# Patient Record
Sex: Female | Born: 1960 | Race: Black or African American | Hispanic: No | Smoking: Former smoker
Health system: Southern US, Community
[De-identification: ages and names within clinical notes are randomized; demographics above are authoritative.]

## PROBLEM LIST (undated history)

## (undated) DIAGNOSIS — N281 Cyst of kidney, acquired: Secondary | ICD-10-CM

## (undated) DIAGNOSIS — I10 Essential (primary) hypertension: Secondary | ICD-10-CM

## (undated) DIAGNOSIS — K5900 Constipation, unspecified: Secondary | ICD-10-CM

## (undated) DIAGNOSIS — F1910 Other psychoactive substance abuse, uncomplicated: Secondary | ICD-10-CM

## (undated) DIAGNOSIS — J45909 Unspecified asthma, uncomplicated: Secondary | ICD-10-CM

## (undated) DIAGNOSIS — I219 Acute myocardial infarction, unspecified: Secondary | ICD-10-CM

## (undated) DIAGNOSIS — M545 Low back pain, unspecified: Secondary | ICD-10-CM

## (undated) DIAGNOSIS — R0600 Dyspnea, unspecified: Secondary | ICD-10-CM

## (undated) DIAGNOSIS — E785 Hyperlipidemia, unspecified: Secondary | ICD-10-CM

## (undated) DIAGNOSIS — E119 Type 2 diabetes mellitus without complications: Secondary | ICD-10-CM

## (undated) DIAGNOSIS — G8929 Other chronic pain: Secondary | ICD-10-CM

## (undated) DIAGNOSIS — F419 Anxiety disorder, unspecified: Secondary | ICD-10-CM

## (undated) DIAGNOSIS — F32A Depression, unspecified: Secondary | ICD-10-CM

## (undated) DIAGNOSIS — F209 Schizophrenia, unspecified: Secondary | ICD-10-CM

## (undated) DIAGNOSIS — T7840XA Allergy, unspecified, initial encounter: Secondary | ICD-10-CM

## (undated) DIAGNOSIS — G4730 Sleep apnea, unspecified: Secondary | ICD-10-CM

## (undated) DIAGNOSIS — K449 Diaphragmatic hernia without obstruction or gangrene: Secondary | ICD-10-CM

## (undated) DIAGNOSIS — F319 Bipolar disorder, unspecified: Secondary | ICD-10-CM

## (undated) DIAGNOSIS — J189 Pneumonia, unspecified organism: Secondary | ICD-10-CM

## (undated) DIAGNOSIS — E039 Hypothyroidism, unspecified: Secondary | ICD-10-CM

## (undated) DIAGNOSIS — M1990 Unspecified osteoarthritis, unspecified site: Secondary | ICD-10-CM

## (undated) DIAGNOSIS — K219 Gastro-esophageal reflux disease without esophagitis: Secondary | ICD-10-CM

## (undated) DIAGNOSIS — J449 Chronic obstructive pulmonary disease, unspecified: Secondary | ICD-10-CM

## (undated) DIAGNOSIS — I509 Heart failure, unspecified: Secondary | ICD-10-CM

## (undated) DIAGNOSIS — G43909 Migraine, unspecified, not intractable, without status migrainosus: Secondary | ICD-10-CM

## (undated) DIAGNOSIS — I639 Cerebral infarction, unspecified: Secondary | ICD-10-CM

## (undated) DIAGNOSIS — D649 Anemia, unspecified: Secondary | ICD-10-CM

## (undated) DIAGNOSIS — I700 Atherosclerosis of aorta: Secondary | ICD-10-CM

## (undated) DIAGNOSIS — R200 Anesthesia of skin: Secondary | ICD-10-CM

## (undated) DIAGNOSIS — F329 Major depressive disorder, single episode, unspecified: Secondary | ICD-10-CM

## (undated) DIAGNOSIS — D126 Benign neoplasm of colon, unspecified: Secondary | ICD-10-CM

## (undated) HISTORY — DX: Sleep apnea, unspecified: G47.30

## (undated) HISTORY — DX: Heart failure, unspecified: I50.9

## (undated) HISTORY — DX: Hypothyroidism, unspecified: E03.9

## (undated) HISTORY — DX: Allergy, unspecified, initial encounter: T78.40XA

## (undated) HISTORY — DX: Gastro-esophageal reflux disease without esophagitis: K21.9

## (undated) HISTORY — DX: Diaphragmatic hernia without obstruction or gangrene: K44.9

## (undated) HISTORY — PX: BREAST BIOPSY: SHX20

## (undated) HISTORY — DX: Constipation, unspecified: K59.00

## (undated) HISTORY — DX: Bipolar disorder, unspecified: F31.9

## (undated) HISTORY — DX: Anemia, unspecified: D64.9

## (undated) HISTORY — DX: Atherosclerosis of aorta: I70.0

## (undated) HISTORY — DX: Benign neoplasm of colon, unspecified: D12.6

## (undated) HISTORY — DX: Unspecified osteoarthritis, unspecified site: M19.90

## (undated) HISTORY — DX: Cyst of kidney, acquired: N28.1

## (undated) HISTORY — DX: Anesthesia of skin: R20.0

## (undated) HISTORY — DX: Other psychoactive substance abuse, uncomplicated: F19.10

## (undated) HISTORY — DX: Depression, unspecified: F32.A

## (undated) HISTORY — DX: Hyperlipidemia, unspecified: E78.5

## (undated) HISTORY — DX: Anxiety disorder, unspecified: F41.9

## (undated) HISTORY — DX: Major depressive disorder, single episode, unspecified: F32.9

## (undated) HISTORY — DX: Chronic obstructive pulmonary disease, unspecified: J44.9

## (undated) HISTORY — PX: THYROID SURGERY: SHX805

## (undated) HISTORY — PX: TUBAL LIGATION: SHX77

---

## 1992-02-08 HISTORY — PX: PATELLA FRACTURE SURGERY: SHX735

## 1997-05-27 ENCOUNTER — Encounter: Admission: RE | Admit: 1997-05-27 | Discharge: 1997-05-27 | Payer: Self-pay | Admitting: Family Medicine

## 1997-05-28 ENCOUNTER — Encounter: Admission: RE | Admit: 1997-05-28 | Discharge: 1997-05-28 | Payer: Self-pay | Admitting: Family Medicine

## 1997-07-14 ENCOUNTER — Encounter: Admission: RE | Admit: 1997-07-14 | Discharge: 1997-07-14 | Payer: Self-pay | Admitting: Family Medicine

## 1997-07-23 ENCOUNTER — Emergency Department (HOSPITAL_COMMUNITY): Admission: EM | Admit: 1997-07-23 | Discharge: 1997-07-23 | Payer: Self-pay | Admitting: Emergency Medicine

## 2000-10-19 ENCOUNTER — Encounter: Payer: Self-pay | Admitting: *Deleted

## 2000-10-19 ENCOUNTER — Encounter: Admission: RE | Admit: 2000-10-19 | Discharge: 2000-10-19 | Payer: Self-pay | Admitting: *Deleted

## 2000-10-23 ENCOUNTER — Encounter: Payer: Self-pay | Admitting: *Deleted

## 2000-10-23 ENCOUNTER — Encounter: Admission: RE | Admit: 2000-10-23 | Discharge: 2000-10-23 | Payer: Self-pay | Admitting: *Deleted

## 2000-10-24 ENCOUNTER — Encounter: Payer: Self-pay | Admitting: *Deleted

## 2000-10-24 ENCOUNTER — Ambulatory Visit (HOSPITAL_COMMUNITY): Admission: RE | Admit: 2000-10-24 | Discharge: 2000-10-24 | Payer: Self-pay | Admitting: *Deleted

## 2000-11-16 ENCOUNTER — Encounter: Payer: Self-pay | Admitting: Surgery

## 2000-11-16 ENCOUNTER — Encounter: Admission: RE | Admit: 2000-11-16 | Discharge: 2000-11-16 | Payer: Self-pay | Admitting: Surgery

## 2000-11-16 ENCOUNTER — Ambulatory Visit (HOSPITAL_BASED_OUTPATIENT_CLINIC_OR_DEPARTMENT_OTHER): Admission: RE | Admit: 2000-11-16 | Discharge: 2000-11-16 | Payer: Self-pay | Admitting: Surgery

## 2000-11-16 ENCOUNTER — Encounter (INDEPENDENT_AMBULATORY_CARE_PROVIDER_SITE_OTHER): Payer: Self-pay | Admitting: Specialist

## 2000-12-09 ENCOUNTER — Emergency Department (HOSPITAL_COMMUNITY): Admission: EM | Admit: 2000-12-09 | Discharge: 2000-12-09 | Payer: Self-pay | Admitting: Emergency Medicine

## 2001-05-20 ENCOUNTER — Emergency Department (HOSPITAL_COMMUNITY): Admission: EM | Admit: 2001-05-20 | Discharge: 2001-05-20 | Payer: Self-pay | Admitting: Internal Medicine

## 2002-02-07 HISTORY — PX: ABDOMINAL HYSTERECTOMY: SHX81

## 2002-05-22 ENCOUNTER — Emergency Department (HOSPITAL_COMMUNITY): Admission: EM | Admit: 2002-05-22 | Discharge: 2002-05-22 | Payer: Self-pay

## 2002-05-23 ENCOUNTER — Encounter: Payer: Self-pay | Admitting: Emergency Medicine

## 2002-10-21 ENCOUNTER — Ambulatory Visit (HOSPITAL_COMMUNITY): Admission: RE | Admit: 2002-10-21 | Discharge: 2002-10-21 | Payer: Self-pay | Admitting: *Deleted

## 2002-10-21 ENCOUNTER — Encounter: Payer: Self-pay | Admitting: *Deleted

## 2002-11-11 ENCOUNTER — Encounter: Admission: RE | Admit: 2002-11-11 | Discharge: 2002-11-11 | Payer: Self-pay | Admitting: *Deleted

## 2002-11-11 ENCOUNTER — Encounter: Payer: Self-pay | Admitting: *Deleted

## 2003-02-16 ENCOUNTER — Emergency Department (HOSPITAL_COMMUNITY): Admission: EM | Admit: 2003-02-16 | Discharge: 2003-02-16 | Payer: Self-pay | Admitting: Emergency Medicine

## 2003-02-20 ENCOUNTER — Encounter (INDEPENDENT_AMBULATORY_CARE_PROVIDER_SITE_OTHER): Payer: Self-pay | Admitting: Specialist

## 2003-02-21 ENCOUNTER — Inpatient Hospital Stay (HOSPITAL_COMMUNITY): Admission: RE | Admit: 2003-02-21 | Discharge: 2003-02-22 | Payer: Self-pay | Admitting: *Deleted

## 2003-02-25 ENCOUNTER — Observation Stay (HOSPITAL_COMMUNITY): Admission: AD | Admit: 2003-02-25 | Discharge: 2003-02-26 | Payer: Self-pay | Admitting: Obstetrics and Gynecology

## 2003-06-11 ENCOUNTER — Inpatient Hospital Stay (HOSPITAL_COMMUNITY): Admission: EM | Admit: 2003-06-11 | Discharge: 2003-06-16 | Payer: Self-pay | Admitting: Emergency Medicine

## 2003-06-16 ENCOUNTER — Inpatient Hospital Stay (HOSPITAL_COMMUNITY): Admission: RE | Admit: 2003-06-16 | Discharge: 2003-06-18 | Payer: Self-pay | Admitting: Psychiatry

## 2003-07-11 ENCOUNTER — Emergency Department (HOSPITAL_COMMUNITY): Admission: EM | Admit: 2003-07-11 | Discharge: 2003-07-12 | Payer: Self-pay | Admitting: Emergency Medicine

## 2003-11-03 ENCOUNTER — Emergency Department (HOSPITAL_COMMUNITY): Admission: EM | Admit: 2003-11-03 | Discharge: 2003-11-03 | Payer: Self-pay | Admitting: Emergency Medicine

## 2004-01-27 ENCOUNTER — Emergency Department (HOSPITAL_COMMUNITY): Admission: EM | Admit: 2004-01-27 | Discharge: 2004-01-27 | Payer: Self-pay | Admitting: Emergency Medicine

## 2004-07-30 ENCOUNTER — Emergency Department (HOSPITAL_COMMUNITY): Admission: EM | Admit: 2004-07-30 | Discharge: 2004-07-31 | Payer: Self-pay | Admitting: Emergency Medicine

## 2004-07-31 ENCOUNTER — Inpatient Hospital Stay (HOSPITAL_COMMUNITY): Admission: EM | Admit: 2004-07-31 | Discharge: 2004-08-06 | Payer: Self-pay | Admitting: Psychiatry

## 2004-07-31 ENCOUNTER — Ambulatory Visit: Payer: Self-pay | Admitting: Psychiatry

## 2005-02-20 ENCOUNTER — Emergency Department (HOSPITAL_COMMUNITY): Admission: EM | Admit: 2005-02-20 | Discharge: 2005-02-20 | Payer: Self-pay | Admitting: Emergency Medicine

## 2006-02-07 HISTORY — PX: APPENDECTOMY: SHX54

## 2006-03-17 ENCOUNTER — Emergency Department (HOSPITAL_COMMUNITY): Admission: EM | Admit: 2006-03-17 | Discharge: 2006-03-17 | Payer: Self-pay | Admitting: Emergency Medicine

## 2007-10-19 ENCOUNTER — Emergency Department (HOSPITAL_COMMUNITY): Admission: EM | Admit: 2007-10-19 | Discharge: 2007-10-20 | Payer: Self-pay | Admitting: Emergency Medicine

## 2007-12-27 ENCOUNTER — Ambulatory Visit (HOSPITAL_COMMUNITY): Admission: RE | Admit: 2007-12-27 | Discharge: 2007-12-27 | Payer: Self-pay | Admitting: Otolaryngology

## 2007-12-27 ENCOUNTER — Encounter (INDEPENDENT_AMBULATORY_CARE_PROVIDER_SITE_OTHER): Payer: Self-pay | Admitting: Interventional Radiology

## 2008-02-08 DIAGNOSIS — I639 Cerebral infarction, unspecified: Secondary | ICD-10-CM

## 2008-02-08 HISTORY — DX: Cerebral infarction, unspecified: I63.9

## 2008-12-11 ENCOUNTER — Emergency Department (HOSPITAL_COMMUNITY): Admission: EM | Admit: 2008-12-11 | Discharge: 2008-12-11 | Payer: Self-pay | Admitting: Emergency Medicine

## 2009-01-13 ENCOUNTER — Other Ambulatory Visit: Payer: Self-pay | Admitting: Emergency Medicine

## 2009-01-13 ENCOUNTER — Ambulatory Visit: Payer: Self-pay | Admitting: Psychiatry

## 2009-01-13 ENCOUNTER — Inpatient Hospital Stay (HOSPITAL_COMMUNITY): Admission: AD | Admit: 2009-01-13 | Discharge: 2009-01-14 | Payer: Self-pay | Admitting: Psychiatry

## 2010-02-28 ENCOUNTER — Encounter: Payer: Self-pay | Admitting: Otolaryngology

## 2010-05-11 LAB — CBC
HCT: 42.9 % (ref 36.0–46.0)
Hemoglobin: 14.5 g/dL (ref 12.0–15.0)
MCV: 92.6 fL (ref 78.0–100.0)
RDW: 13.6 % (ref 11.5–15.5)

## 2010-05-11 LAB — URINALYSIS, ROUTINE W REFLEX MICROSCOPIC
Leukocytes, UA: NEGATIVE
Nitrite: NEGATIVE
Protein, ur: NEGATIVE mg/dL
Urobilinogen, UA: 1 mg/dL (ref 0.0–1.0)

## 2010-05-11 LAB — BASIC METABOLIC PANEL
Chloride: 105 mEq/L (ref 96–112)
GFR calc non Af Amer: >60 mL/min (ref >60)
Glucose, Bld: 97 mg/dL (ref 70–99)
Potassium: 3.8 mEq/L (ref 3.5–5.1)
Sodium: 138 mEq/L (ref 135–145)

## 2010-05-11 LAB — RAPID URINE DRUG SCREEN, HOSP PERFORMED
Amphetamines: NOT DETECTED
Benzodiazepines: NOT DETECTED
Tetrahydrocannabinol: POSITIVE — AB

## 2010-05-11 LAB — URINE MICROSCOPIC-ADD ON

## 2010-05-11 LAB — DIFFERENTIAL
Lymphocytes Relative: 25 % (ref 12–46)
Lymphs Abs: 2.1 10*3/uL (ref 0.7–4.0)
Neutrophils Relative %: 67 % (ref 43–77)

## 2010-05-11 LAB — TSH: TSH: 0.79 u[IU]/mL (ref 0.350–4.500)

## 2010-06-25 NOTE — Discharge Summary (Signed)
NAME:  Lindsay Rivera, Lindsay Rivera                         ACCOUNT NO.:  [id number]   MEDICAL RECORD NO.:  [id number]                   PATIENT TYPE:  IPS   LOCATION:  0304                                 FACILITY:  [facility]   PHYSICIAN:  Jeanice Lim, M.D.              DATE OF BIRTH:  [birth year]   DATE OF ADMISSION:  06/16/2003  DATE OF DISCHARGE:  06/18/2003                                 DISCHARGE SUMMARY   IDENTIFYING DATA:  This is a 50 year old African-American female, married,  involuntarily admitted, with a history of cocaine abuse, clean for 13 years.  The patient may not have realized cocaine was in her urine drug screen.  She  had been using cannabis increasingly for a month and that increased  depressive symptoms, restless legs and thoughts of cutting wrist for 4 days,  endorsing at least 2 weeks of depressed mood, irritability, with decreased  concentration, no clear history of mood swings.  This is the first  psychiatric admission.  The patient gives a history of possible mood swings  in the past, with cocaine use, mostly irritability and cocaine abuse in the  distant past, and more recent cannabis abuse.   ADMISSION MEDICATIONS:  None at home.  The patient had been started on  Lexapro 10 mg in May, something for sleep in the past, Ativan 0.5 b.i.d. and  oxycodone at home, Nu-Iron, and Naprosyn.   ALLERGIES:  ASPIRIN, PENICILLIN.   PHYSICAL EXAMINATION:  Within normal limits, neurologically nonfocal.   ROUTINE ADMISSION LABS:  Within normal limits.   MENTAL STATUS EXAM:  Fully alert, pleasant, cooperative, with bright affect,  labile at times.  Speech within normal limits with some pressure.  Mood  depressed, labile, irritable.  Thought processes positive suicidal ideation,  no psychotic symptoms, cognitively intact.  Judgment and insight poor.   ADMISSION DIAGNOSES:   AXIS I:  1. Bipolar 2 disorder.  2. 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  3. Cannabis abuse.   AXIS II:   Deferred.   AXIS III:  Status post appendectomy, iron deficiency anemia.   AXIS IV:  Moderate stressors, including marital conflict.   AXIS V:  35/60.   HOSPITAL COURSE:  The patient was admitted and ordered routine p.r.n.  medications, underwent further monitoring, and was encouraged to participate  in individual, group and milieu therapy.  The patient was assigned to dual  diagnosis program.  Family session with husband was requested, and Lexapro  was not restarted.  The patient requested outpatient counseling, was  agreeable to this.  The patient was started on  Zyprexa Zydis, Vistaril for  anxiety, and Ambien for sleep.  The patient showed no dangerous ideation,  improved judgment and insight, as well as a more stable mood.  She was  resumed on pain medicine, Vistaril p.r.n., given a nicotine patch, set up  for substance abuse outpatient treatment and psychiatric follow-up and  discharged after a  family meeting which was productive.  The patient was  discharged in improved condition, denied any dangerous ideation, being more  stable, again judgment and insight as well as coping skills improved.  The  patient reported motivation to be compliant with the aftercare plan, was  give medication education.   DISCHARGE MEDICATIONS:  1. Zyprexa Zydis 5 mg 1 b.i.d. p.r.n. and 10 mg q.9 p.m.  2. Ambien 10 mg q.h.s. p.r.n.  3. Vistaril 50 mg q.8 p.r.n. anxiety.   DISPOSITION:  The patient was to follow up with Dr. Malvin Johns Monday, May 15  at 2 p.m. in [facility] with Dr. Lolly Mustache June 2, and  Arley Phenix June 10 at 10 a.m.   DISCHARGE DIAGNOSES:   AXIS I:  1. Bipolar 2 disorder.  2. 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  3. Cannabis abuse.   AXIS II:  Deferred.   AXIS III:  Status post appendectomy, iron deficiency anemia.   AXIS IV:  Moderate stressors, including marital conflict.   AXIS V:  Global assessment of function on discharge was 55-60.                                                Jeanice Lim, M.D.    JEM/MEDQ  D:  07/20/2003  T:  07/20/2003  Job:  [id number]

## 2010-06-25 NOTE — Discharge Summary (Signed)
NAME:  Lindsay Rivera, Lindsay Rivera                         ACCOUNT NO.:  [id number]   MEDICAL RECORD NO.:  [id number]                   PATIENT TYPE:  INP   LOCATION:  0457                                 FACILITY:  [facility]   PHYSICIAN:  Gerri Spore B. Earlene Plater, M.D.               DATE OF BIRTH:  [birth year]   DATE OF ADMISSION:  02/20/2003  DATE OF DISCHARGE:  02/22/2003                                 DISCHARGE SUMMARY   ADMISSION DIAGNOSES:  1. Menorrhagia.  2. Stress urinary incontinence.   DISCHARGE DIAGNOSES:  1. Menorrhagia.  2. Stress urinary incontinence.   PROCEDURE:  1. Laparoscopic assisted vaginal hysterectomy by Dr. Earlene Plater.  2. Pubovaginal sling by Dr. Patsi Sears.   HISTORY OF PRESENT ILLNESS:  The patient was admitted for the above issues,  and presented for definitive surgical therapy.   HOSPITAL COURSE:  The patient underwent LAVH without difficulty, and  subsequent sling for stress urinary incontinence by Dr. Patsi Sears.  The  procedures went without complications.   Postoperatively, the patient had rapid ability to ambulate and tolerate a  regular diet.  However, she had voiding difficulty, which required overnight  admission.  On the first postoperative day __________ in and out  catheterization and medical management.  By the following morning, she was  voiding well, and was discharged home in satisfactory condition.   DISCHARGE MEDICATIONS:  1. Tylox 1-2 p.o. q.4-6h. for pain.  2. Urecholine.  3. Flomax.  4. Atarax per Dr. Patsi Sears.   DISPOSITION:  Discharged in satisfactory condition.   DISCHARGE INSTRUCTIONS:  No heavy lifting.  Nothing in the vagina for 6  weeks.  No driving for 2 weeks.  Call with fever, pain, nausea, vomiting,  voiding difficulties, or other concerns.   FOLLOW UP:  1. In 4 weeks with Dr. Earlene Plater.  2. Dr. Imelda Pillow office will call to arrange follow up.                                               Gerri Spore B. Earlene Plater, M.D.    WBD/MEDQ  D:   04/12/2003  T:  04/12/2003  Job:  [id number]

## 2010-06-25 NOTE — Op Note (Signed)
NAME:  Lindsay Rivera, Lindsay Rivera                         ACCOUNT NO.:  [id number]   MEDICAL RECORD NO.:  [id number]                   PATIENT TYPE:  AMB   LOCATION:  DAY                                  FACILITY:  [facility]   PHYSICIAN:  Sigmund I. Patsi Sears, M.D.         DATE OF BIRTH:  1960/07/29   DATE OF PROCEDURE:  02/20/2003  DATE OF DISCHARGE:                                 OPERATIVE REPORT   PREOPERATIVE DIAGNOSIS:  Stress urinary incontinence.   POSTOPERATIVE DIAGNOSIS:  Stress urinary incontinence.   OPERATION:  Pubovaginal sling.   SURGEON:  Sigmund I. Patsi Sears, M.D.   ASSISTANT:  Terri Piedra, N.P.   PROCEDURE:  Following laparoscopically-assisted vaginal hysterectomy, the  patient's vagina was inspected and a Foley catheter was inserted.  Marcaine  0.25% plain 10 mL was injected into the pubocervical fascial arch tissue  over the urethra.  A 2 cm urethral incision was made, subcutaneous tissue  dissected, and the retropubic space identified bilaterally.  Using the  transobturator passer, the pubovaginal sling was placed bilaterally.  Cystoscopy revealed no evidence of sling within the bladder.  Following this  the sling was cut subcutaneously with tensioning corrected and the wound  closed with two separate layers of 3-0 Vicryl suture.  The Foley catheter  was left in place and vaginal packing was placed with Estrace.  The patient  tolerated the procedure well, was awakened and taken to the recovery room in  good condition.  She is not given Toradol.                                               Sigmund I. Patsi Sears, M.D.    SIT/MEDQ  D:  02/20/2003  T:  02/20/2003  Job:  [id number]   cc:   Gerri Spore B. Earlene Plater, M.D.  [address]  [place]  [place] [address]  Fax: [phone]

## 2010-06-25 NOTE — Op Note (Signed)
NAME:  Lindsay Rivera, Lindsay Rivera                         ACCOUNT NO.:  [id number]   MEDICAL RECORD NO.:  [id number]                   PATIENT TYPE:  INP   LOCATION:  IC04                                 FACILITY:  [facility]   PHYSICIAN:  Barbaraann Barthel, M.D.              DATE OF BIRTH:  02/14/1960   DATE OF PROCEDURE:  06/11/2003  DATE OF DISCHARGE:                                 OPERATIVE REPORT   SURGEON:  Barbaraann Barthel, M.D.   PREOPERATIVE DIAGNOSIS:  Acute appendicitis.   POSTOPERATIVE DIAGNOSIS:  Acute appendicitis.   PROCEDURE:  Open appendectomy.   SPECIMENS:  Appendix.   INDICATIONS FOR PROCEDURE:  This is a 50 year old black female who came to  the emergency room with nausea, vomiting, elevated white count, right lower  quadrant pain, and a CT scan which showed acute appendicitis.  Surgery was  consulted and responded immediately.   We discussed surgery with the patient in detail, including complications not  limited to but including bleeding, infection, and appendiceal stump leak.  Informed consent was obtained.   Other issues included depression and anxiety and questionable suicidal  problems, and for that reason we have consulted the appropriate hospitalist  to follow her medically.  She will be observed in the intensive care unit.   GROSS OPERATIVE FINDINGS:  Those consistent with an acute suppurative,  nonperforated appendicitis.  The terminal ileum was normal.  No other  abnormalities were encountered in the right lower quadrant.   TECHNIQUE:  The patient was placed in the supine position.  After the  adequate administration of general anesthesia via endotracheal intubation, a  Foley catheter was aseptically inserted and the abdomen was prepped with  Betadine solution and draped in the usual manner.   A right lower quadrant incision was carried out through the skin and  subcuticular to the rectus muscle sheath.  The rectus muscle was then  retracted medially.  We  grasped the posterior sheath and the peritoneum and  entered the abdominal cavity.  The appendix was readily encountered.  The  mesoappendix was ligated with 2-0 silk, and the appendix was then amputated  with the TA30 stapling device.  We lightly cauterized this with the tip of  the cautery.  We then changed gloves, irrigated the right lower quadrant,  and closed the abdomen according to layers, closing the peritoneum with an 0  Polysorb and the fascia with interrupted figure-of-eight 0 Polysorb.  The  subcutaneous tissue  was irrigated, and the skin was approximated with a stapling device.  Prior  to closure, all sponge, needle, and instrument counts were found to be  correct.  Estimated blood loss was minimal.  The patient received 1 L of  crystalloid intraoperatively.  No drains were placed, and there were no  complications.      ___________________________________________  Barbaraann Barthel, M.D.   WB/MEDQ  D:  06/11/2003  T:  06/12/2003  Job:  [id number]   cc:   Hanley Hays. Dechurch, M.D.  [address]  [place]  [place] [address]  Fax: [phone]   Hilario Quarry, M.D.  [address]  [place]  [place] [address]  Fax: [phone]

## 2010-06-25 NOTE — H&P (Signed)
NAME:  Lindsay Rivera, Lindsay Rivera                         ACCOUNT NO.:  [id number]   MEDICAL RECORD NO.:  [id number]                   PATIENT TYPE:  INP   LOCATION:  9319                                 FACILITY:  [facility]   PHYSICIAN:  Gerri Spore B. Earlene Plater, M.D.               DATE OF BIRTH:  1960/09/28   DATE OF ADMISSION:  02/25/2003  DATE OF DISCHARGE:                                HISTORY & PHYSICAL   ADMISSION DIAGNOSES:  1. Status post laparoscopically-assisted vaginal hysterectomy and sling on     February 20, 2003.  2. Postoperative vaginal cuff cellulitis with lower abdominal pain.   HISTORY OF PRESENT ILLNESS:  A 50 year old African-American female status  post LAVH and sling at [facility] on February 20, 2003, in  conjunction with Dr. Patsi Sears.  The patient's hysterectomy was for  fibroids and menorrhagia and was without incident.  The patient subsequently  underwent the obturator tape sling for urinary incontinence with Dr.  Patsi Sears.  The patient's postoperative course was uncomplicated other than  some itching related apparently to pain medications.  She was discharged  home on the first postoperative day without incident.   Over the last two to three days the patient complains of intermittent fever  as high as 101 with associated nausea and vomiting since last night.  Did  have a bowel movement yesterday, which relieved some lower abdominal pain  she had been having.  At this point she states she has some mild dysuria,  slight difficulty voiding, and some low back pain over the weekend.  Now  complaining of lower abdominal sharp and shooting pain which is moderate in  intensity, receiving relief with Percocet.   PAST MEDICAL HISTORY:  Fibroids.   PAST SURGICAL HISTORY:  1. LAVH, sling as above.  2. Tubal ligation.  3. Left oophorectomy during pregnancy.  4. Benign breast biopsy.   MEDICATIONS:  Percocet.   ALLERGIES:  ASPIRIN and PENICILLIN (causes hives).   SOCIAL HISTORY:  The patient is a one pack per week smoker.  No alcohol or  other drugs.   FAMILY HISTORY:  Noncontributory.   REVIEW OF SYSTEMS:  Otherwise noncontributory.   PHYSICAL EXAMINATION:  VITAL SIGNS:  Blood pressure 118/78, temperature  97.7, weight 136.  GENERAL:  Alert and oriented, in no acute distress but obviously in moderate  discomfort.  ABDOMEN:  Soft with active bowel sounds.  Her incisions from the  laparoscopic portion are healing well.  The patient has mild bilateral lower  quadrant tenderness without acute abdominal signs.  PELVIC:  Normal external genitalia. Vaginal cuff and incision over the sling  are healing well.  The patient is tender at the cuff to palpation.  No mass  palpable.   Wet prep performed in the office showed too numerous to count white cells  and a few yeast.  In-and-out catheterization done in the office showed trace  leukocyte, otherwise negative.  Complete blood count done in the office  showed white count 10.6, hemoglobin 10.5, platelets 431, differential with  increased PMNs at 75%.   ASSESSMENT:  Probable cuff cellulitis after laparoscopically-assisted  vaginal hysterectomy.  Given that the patient is having problems with nausea  and vomiting, I did not think she would do well with oral medications for  antibiotics.  Therefore, she is admitted for IV hydration, antiemetics, and  IV antibiotics.  Will also check CT of the abdomen and pelvis to rule out  other acute process.  Also, a urine culture has been sent from the office.                                               Gerri Spore B. Earlene Plater, M.D.    WBD/MEDQ  D:  02/25/2003  T:  02/25/2003  Job:  [id number]

## 2010-06-25 NOTE — Discharge Summary (Signed)
Lindsay Rivera, PULSE               ACCOUNT NO.:  [id number]   MEDICAL RECORD NO.:  [id number]          PATIENT TYPE:  IPS   LOCATION:  0404                          FACILITY:  [facility]   PHYSICIAN:  Anselm Jungling, MD  DATE OF BIRTH:  Sep 17, 1960   DATE OF ADMISSION:  07/31/2004  DATE OF DISCHARGE:  08/06/2004                                 DISCHARGE SUMMARY   IDENTIFYING DATA AND REASON FOR ADMISSION:  [facility] Lindsay [facility], a 50 year old married female who was  admitted through the emergency department, requesting help for cessation of  drug abuse.  She described her drug abuse of consisting of cocaine and  marijuana.  She reported suicidal and homicidal ideation.   PAST HISTORY:  The patient had seen Dr. Lolly Mustache in the past in the [facility]  outpatient office.  She had had marital difficulties.  Her previous [facility]  admission had been in May of 2005.  She was previously diagnosed with  bipolar disorder type 2.  Past medications had included Lexapro, Zyprexa,  and Ativan.   INITIAL DIAGNOSES:  AXIS I:  Psychosis not otherwise specified, rule out  substance-induced psychosis, cocaine abuse, and depressive disorder not  otherwise specified.Marland Kitchen  AXIS II:  Deferred.  AXIS III:  Knee abrasion, iron deficiency anemia, pyuria, left thumb pain,  and allergies to ASPIRIN and PENICILLIN.  AXIS IV:  Stressors, severe.  AXIS V:  Global assessment of function on admission 20 to 30.   MEDICAL, LABORATORY AND PHYSICAL EXAMINATION:  The patient was medically  cleared at the emergency room prior to transfer to the inpatient psychiatric  service.  TSH was 0.836, within normal limits.  Valproic acid level just  prior to discharge was 75.4, within the therapeutic range.   HOSPITAL COURSE:  The patient presented initially quite angry, agitated, and  finding fault with the unit staff.  She was placed on q.15 minute checks for  safety.  At times she was verbally  threatening to staff and so was watched  more closely because of this.  She was ordered Zyprexa 10 mg h.s., also  p.r.n. Ativan was utilized.   Over the next 5 days, the patient gradually became less irritable, more  cooperative, and re directable.  There were no further incidents of her  threatening staff.  She was able to have insight into the fact that her  behaviors and level of irritability were far out of proportion to the  situation and quite inappropriate.  She was interested in utilizing  medication treatment to stabilize this aspect of her mood disorder.  She was  open to a trial of Depakote, which was begun the in extended release form at  250 mg q.a.m., and 750 mg q.h.s.  This was well tolerated.   The patient was treated with Septra DS for apparent urinary tract infection.   By the 7th hospital day, the patient was felt to be appropriate for  discharge to aftercare.   AFTERCARE PLANS:  The patient was to be followed up at the [facility] health  system  clinic in [place].  She was to be seen there on July 11 at 3 p.m.  by Florencia Reasons, and on July 14, 11:30 a.m., by psychiatrist Dr. Toni Arthurs.   DISCHARGE MEDICATIONS:  Zyprexa 10 mg p.o. q.h.s.  Depakote 250 mg q.a.m.  and 750 mg q.p.m., trazodone 50 mg q.h.s., Symmetrel 100 mg b.i.d.,  Valacyclovir 500 mg b.i.d. for genital lesions, and Septra DS 1 tablet  b.i.d. x3 days, and hydrocortisone cream 1% to lesions b.i.d.   DISCHARGE DIAGNOSES:  AXIS I:  Bipolar affective disorder, manic, without  psychotic features, resolving, polysubstance dependence.  AXIS II:  Deferred.  AXIS III:  Genital herpes.  AXIS IV:  Stressors, severe.  AXIS V:  Global assessment of function on discharge 55.     _______________    SPB/MEDQ  D:  08/25/2004  T:  08/25/2004  Job:  [id number]

## 2010-06-25 NOTE — Discharge Summary (Signed)
NAME:  Lindsay Rivera, Lindsay Rivera                         ACCOUNT NO.:  [id number]   MEDICAL RECORD NO.:  [id number]                   PATIENT TYPE:  INP   LOCATION:  9319                                 FACILITY:  [facility]   PHYSICIAN:  Gerri Spore B. Earlene Plater, M.D.               DATE OF BIRTH:  Nov 08, 1960   DATE OF ADMISSION:  02/25/2003  DATE OF DISCHARGE:  02/26/2003                                 DISCHARGE SUMMARY   ADMISSION DIAGNOSES:  1. Post hysterectomy vaginal cuff cellulitis.  2. Nausea and vomiting.   HISTORY OF PRESENT ILLNESS:  For details, please see the history and  physical on the chart. Briefly, the patient presented approximately 1 week  status post LAVH and sling at [facility] with myself and  in conjunction with Dr. Patsi Sears. The hysterectomy was for fibroids and  menorrhagia and the patient also had stress urinary incontinence. There were  no complications during surgery. The patient was seen in the office on the  day of admission with complaints of intermittent fever as high as 101 with  associated nausea and vomiting. Had been having bowel movements. Had had  some dysuria and low back pain. Urine culture was sent from the office and  is pending at this time. Was noted to have some vaginal cuff tenderness.  Given her nausea and vomiting, the patient felt that she would be unable to  take oral medication at home for outpatient treatment of her vaginal cuff  cellulitis and was therefore admitted for observation, hydration and  intravenous antiemetics and antibiotics.   HOSPITAL COURSE:  The patient was admitted and started on intravenous  Gentamicin and Clindamycin. She was given intravenous Phenergan for nausea  and Toradol for pain relief. She was hydrated intravenously and diet was  advanced as tolerated. The patient's pain improved during her stay and she  did not have any fever. Her white blood cell count remained stable  throughout her course at 10,000. CT  scan of the abdomen and pelvis showed no  evidence of any acute intra-abdominal or pelvic process. The right ovary  appeared somewhat large and the left ovary appeared normal. Subsequent  pelvic ultrasound showed an enlarged ovary at 5.7 cm with normal arterial  and venous flow and recommendation was for a repeat ultrasound in 6 to 10  weeks.   DISPOSITION:  Given that the patient's pain had improved and she did not  have fever during her stay, she was discharged to home to complete a course  of oral antibiotics, as she was able to tolerate a regular diet.   DISCHARGE MEDICATIONS:  1. Clindamycin 450 mg p.o. q. 6 hours x7 days.  2. Phenergan 25 mg p.o. q. 6 hours p.r.n. nausea.   DISCHARGE INSTRUCTIONS:  The patient to call if any increase or worsening of  symptoms or recurrence of fever, nausea, or vomiting.   FOLLOW UP:  [facility]  with Dr. Earlene Plater in 1 weeks.   CONDITION ON DISCHARGE:  Satisfactory.                                              Gerri Spore B. Earlene Plater, M.D.   WBD/MEDQ  D:  02/26/2003  T:  02/27/2003  Job:  [id number]

## 2010-06-25 NOTE — Op Note (Signed)
[facility]  Patient:    Lindsay Rivera, Lindsay Rivera Visit Number: [id number] MRN: [id number]          Service Type: DSU Location: [facility] Attending Physician:  Charlton Haws Dictated by:   Currie Paris, M.D. Proc. Date: 11/16/00 Admit Date:  11/16/2000   CC:         Dr. Darl Householder   Operative Report  CCS# [id number]  PREOPERATIVE DIAGNOSIS: 1. Right breast mass, upper outer quadrant. 2. Right breast mass, upper inner quadrant.  POSTOPERATIVE DIAGNOSIS: 1. Right breast mass, upper outer quadrant. 2. Right breast mass, upper inner quadrant.  OPERATION PERFORMED:  Needle guided excision of two breast masses.  SURGEON:  Currie Paris, M.D.  ANESTHESIA:  General.  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  Neither were palpable.  The patient strongly desired surgical excision rather than follow-up or core biopsies.  DESCRIPTION OF PROCEDURE:  The patient had guide wires placed in radiology at the breast center.  She was seen in the holding area and was prepared for surgery and had no further questions.  The patient was taken to the operating room and after satisfactory general anesthesia had been obtained, the breast was prepped and draped.  The upper inner quadrant lesion was approached first.  The guide wire entered slightly lateral and in inferior to the lesion and an X on the skin marked the spot of the lesion.  I made the incision directly over the X and traveled down through subcutaneous tissues.  I then was able to manipulate the wire into the field and trace it a little further and then by palpation could feel the mass.  I was able to grasp it with an Allis and excise it with the cautery.  Bleeders were then electrocoagulated and the incision closed with 3-0 Vicryl  followed by 4-0 Monocryl subcuticular.  I did infiltrate some Marcaine to help with postoperative analgesia.  Specimen radiograph confirmed the lesion but it was also visible.  Attention was then turned to the upper outer quadrant lesion and an incision again made approximately over the lesion and subcutaneous tissues divided. Again, the guide wire was manipulated into the wound and traced a little deeper into the breast and then when I could palpate the lesion, grasped it with an Allis and excised it.  The incision was closed again with 3-0 Vicryl and 4-0 Monocryl and used Marcaine.  Radiology again reported the lesion had been retrieved.  Steri-Strips applied.  The patient tolerated the procedure well.  There were no operative complications.  All counts were correct. Dictated by:   Currie Paris, M.D. Attending Physician:  Charlton Haws DD:  11/16/00 TD:  11/16/00 Job: [id number]
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
## 2010-06-25 NOTE — Op Note (Signed)
NAME:  Lindsay Rivera, Lindsay Rivera                         ACCOUNT NO.:  [id number]   MEDICAL RECORD NO.:  [id number]                   PATIENT TYPE:  OBV   LOCATION:  0457                                 FACILITY:  [facility]   PHYSICIAN:  Gerri Spore B. Earlene Plater, M.D.               DATE OF BIRTH:  [birth year]   DATE OF PROCEDURE:  02/20/2003  DATE OF DISCHARGE:                                 OPERATIVE REPORT   PREOPERATIVE DIAGNOSES:  1. Menorrhagia.  2. Stress urinary incontinence.   POSTOPERATIVE DIAGNOSES:  1. Menorrhagia.  2. Stress urinary incontinence.   OPERATION/PROCEDURE:  1. Laparoscopic-assisted vaginal hysterectomy.  2. Dr. Patsi Sears performed a sling.   SURGEON:  Chester Holstein. Earlene Plater, M.D.   FINDINGS:  1. Ten-week size uterus.  2. Absent left tube and ovary.  3. Normal-appearing right tube and ovary with filmy adhesions and posterior     cul-de-sac blood loss.   ESTIMATED BLOOD LOSS:  200 mL.   FLUIDS:  2300 mL.   SPECIMENS:  Uterus and cervix.   DRAINS:  Foley catheter.   INDICATIONS:  The patient with history of abnormal bleeding, desiring  surgical management and history of stress urinary incontinence, desiring  surgical repair.  The operative risks have been discussed including  infection, bleeding, damage to surround organs.   DESCRIPTION OF PROCEDURE:  The patient was taken to the operating room and  general anesthesia obtained.  She was placed in the [place] stirrups and  prepped and draped in the standard fashion.  Bladder was entered with a red  rubber catheter.  Examination under anesthesia showed a 10-week size  anteverted uterus.  No adnexal masses.   Gowns and gloves changed.  Attention turned to the abdomen.  A 10 mm  incision made vertically below the umbilicus, carried sharply to the fascia.  The fascia was divided sharply and elevated with Kocher clamps.  The  posterior sheath and peritoneum divided sharply.  Pursestring suture of 0  Vicryl placed around the  fascial defect.  Hasson cannula inserted and  secured.  Pneumoperitoneum obtained with CO2 gas.  The 5 mm ports were  placed in each lower abdominal quadrant under direct laparoscopic  visualization.  Trendelenburg position obtained.  Bowel mobilized  superiorly.  Pelvis was inspected with the above findings noted.  Filmy  adhesions from the right ovary to the posterior cul-de-sac were taken down  sharply.   The upper abdomen was inspected and was normal except for some omental  adhesions in the left upper quadrant just above the umbilicus.   The course of the left ureter identified.  The left round ligament  cauterized with bipolar and divided.  Cardinal ligament was cauterized with  bipolar and divided.  The course of the right ureter was identified.  The  right round ligament and utero-ovarian ligaments were cauterized and divided  with the tripolar.  Bladder flap created sharply.   CO2 gas  was released and the attention to the vagina.  Weighted speculum  inserted.  Posterior colpotomy incision made sharply.  Each uterosacral  ligament was clamped with a curved Heaney clamp, divided and suture ligated  with 0 Vicryl with hemostasis obtained.  The remainder of the vaginal mucosa  was circumscribed with the Bovie.  Anterior colpotomy made sharply.   Deaver retractor placed to the anterior cul-de-sac and the remainder of the  cardinal ligaments were clamped with the LigaSure, cauterized and divided.  Uterus was removed as an intact specimen.  A superficial abrasion of the  bladder muscularis was identified anteriorly.  It was reinforced with a  single  figure-of-eight suture of 0 Vicryl.  Dr. Patsi Sears was informed of  this when he arrived.   The vaginal cuff was closed with figure-of-eight sutures of 0 Vicryl.  McCall's culdoplasty suture was placed prior to closure of the cuff by  entering the posterior cul-de-sac, taking a bite of the left uterosacral  ligament, reforming the  posterior peritoneum; taking a bit of the right  uterosacral ligament and exiting posteriorly on the right.  After the  remainder of the cuff was closed, the culdoplasty suture was tied down which  provided good support to the cuff.   Attention was turned to the abdomen and the pneumoperitoneum reobtained.  Cuff was inspected and was hemostatic as was the right tube and ovary.  Therefore, procedure was terminated.  Scope was removed.  Gas was released  after the inferior ports were removed and those sites were found to be  hemostatic.   The Hasson cannula was removed and placed __________ in the fascial defect,  tying down the pursestring suture.  This is the right fascial defect and no  intra-abdominal contents were herniated through the closure.  Skin  __________ was closed with subcuticular 4-0 Vicryl and infiltrated with a  total of 15 mL of 0.5% Marcaine.  At this point my portion of the procedure  was complete and Dr. Patsi Sears was starting his sling.  Please see his note  for details.                                               Gerri Spore B. Earlene Plater, M.D.    WBD/MEDQ  D:  02/20/2003  T:  02/20/2003  Job:  [id number]

## 2010-06-25 NOTE — Consult Note (Signed)
NAME:  Lindsay Rivera, Lindsay Rivera                         ACCOUNT NO.:  [id number]   MEDICAL RECORD NO.:  [id number]                   PATIENT TYPE:  INP   LOCATION:  IC04                                 FACILITY:  [facility]   PHYSICIAN:  Hanley Hays. Dechurch, M.D.           DATE OF BIRTH:  11/05/1960   DATE OF CONSULTATION:  06/11/2003  DATE OF DISCHARGE:                                   CONSULTATION   REFERRING PHYSICIAN:  Barbaraann Barthel, M.D.   PRIMARY CAREGIVER:  [facility]   A 50 year old African-American female in good health who presented to the  emergency room complaining of abdominal pain, was found to have acute  appendicitis by CT.  During the course of her evaluation, she related the  fact that she felt desire to hurt herself and had some suicidal ideation.  I  was asked to consult for perioperative management.  The patient apparently  has been married 13 years.  She has had increasing arguments with her spouse  and multiple other social issues that have been compounding her depression.  She has never had a history of depression, has never been treated.  She does  note some down time where she feels blue.  Since her hysterectomy in  January, which was done for abnormal Pap smears and persistent bleeding, she  states she has just not felt as positive about things as she has in the  past.  She states she hears voices telling her that everyone would be better  without her, without Lindsay Rivera.  She tends to speak of herself in the third  person.  The patient has a remote history of crack abuse but claims to have  been clean for the last 16 years.  She has been married 13 years to her  current spouse and up until recently she states her relationship has been  stable.  The patient smokes marijuana maybe 1 to 2 times per week.  She has  an occasional beer she states.  She states she never partakes of cocaine  though her cocaine screen was positive today in the emergency room.  She  feels that she may have been given some cocaine-laced marijuana.  The  patient is not employed.  She is a Futures trader.  She has a 3-year-old son, a  108 and a 5 year old adult children who are all alive and well.  She has 4  sisters all alive and well.   FAMILY MEDICAL HISTORY:  Unremarkable for suicide or other factors.   SOCIAL HISTORY:  She is married.  This is a second marriage.  Three children  as noted above.  She smokes 1 to 2 packs per week.   PAST MEDICAL HISTORY:  1. Status post hysterectomy secondary to fibroids and abnormal Pap smear,     apparently had a vaginal hysterectomy, ovaries still intact.  2. Status post a bladder repair/pack.  3. History of a right breast mass  apparently benign.   REVIEW OF SYSTEMS:  As per HPI.   PHYSICAL EXAMINATION:  VITAL SIGNS:  Limited postop.  Blood pressure 135/70,  pulse is 70 and regular sinus.  O2 saturation 94%.  GENERAL:  No distress except for pain.  LUNGS:  Clear.  HEART:  Regular.  No murmur.  ABDOMEN:  Soft.  No bowel sounds presently.  EXTREMITIES:  Without clubbing, cyanosis, or edema.  SKIN:  She has no needle tracks, no rashes, no skin lesions.  NEUROLOGICAL:  Limited postop but nonfocal and appropriate.   ASSESSMENT AND PLAN:  1. Status post appendectomy, stable from the surgical standpoint.  2. Depression with suicidal ideation in the setting of substance abuse.   PLAN:  The patient is admitted to the ICU for postoperative monitoring.  If  we are to believe her reported use of medication, she should be fairly low  risk for any kind of withdrawal or problems related to her substance abuse;  however, her depression and suicidal ideation are another issue which need  to be addressed once she is cleared from the surgical standpoint.  She does  have an ASPIRIN ALLERGY, therefore Toradol is probably contraindicated given  the fact that she has airway involvement.  Have taken the liberty of  adjusting her pain medications  and anxiolytics.  [facility] has already seen the  patient and clearly she would benefit from some outpatient management.  Again, will continue to follow with you.  Thank you for this most  interesting consult.      ___________________________________________                                            Hanley Hays. Josefine Class, M.D.   FED/MEDQ  D:  06/11/2003  T:  06/11/2003  Job:  [id number]

## 2010-06-25 NOTE — Discharge Summary (Signed)
NAME:  Lindsay Rivera, COLLAR                         ACCOUNT NO.:  [id number]   MEDICAL RECORD NO.:  [id number]                   PATIENT TYPE:  INP   LOCATION:  A218                                 FACILITY:  [facility]   PHYSICIAN:  Hanley Hays. Dechurch, M.D.           DATE OF BIRTH:  1960-09-01   DATE OF ADMISSION:  06/11/2003  DATE OF DISCHARGE:  06/16/2003                                 DISCHARGE SUMMARY   DIAGNOSES:  1. Suicidal ideation.  2. Acute appendicitis.  3. Iron-deficiency anemia.  4. Depression.  5. Substance abuse.   DISPOSITION:  The patient being transferred to behavioral health for ongoing  psychiatric management.   PROCEDURES:  Status post appendectomy without complications.   MEDICATIONS:  1. Lexapro 10 daily, start at 06/12/2003.  2. Transdermal nicotine 21 mg daily.  3. Senokot 1 tab b.i.d. p.r.n. constipation.  4. Protonix 40 mg daily.  5. Lorazepam 0.5 b.i.d.  6. Atenolol 650 q.4h. p.r.n. pain.  7. Oxycodone/APAP 5 mg/325 q.4h. p.r.n. moderate-to-severe pain.  8. Naprosyn 500 mg b.i.d. p.r.n. mild-to-moderate pain.  9. Nu-Iron 150 mg b.i.d.   ALLERGIES:  ASPIRIN and PENICILLIN.   HOSPITAL COURSE:  A 50 year old African-American female followed by [facility] presented to the emergency room with progressive right  abdominal, right lower quadrant pain.  She had a toxicology screen positive  for cocaine and marijuana. She related that she has been very depressed and  had suicidal ideation. She underwent appendectomy without complications.  Her bowel function was a little slow to return but no other problems were  noted.  Iron studies consistent with iron deficiency.  Though her suicidal  ideation was not as prevalent during the hospital stay she still gave  significant signs and symptoms of depression and anxiety.  Apparently her substance abuse had caused problems in her relationships.  She felt that she needed help.  She was seen in consultation by  ACT.  It was  recommended that she proceed with inpatient therapy.  She was discharged to  behavioral health in stable condition with the regimen as noted above.    ___________________________________________                                         Hanley Hays. Josefine Class, M.D.   FED/MEDQ  D:  06/16/2003  T:  06/16/2003  Job:  [id number]

## 2010-06-25 NOTE — H&P (Signed)
NAME:  Lindsay Rivera, Lindsay Rivera                         ACCOUNT NO.:  [id number]   MEDICAL RECORD NO.:  [id number]                   PATIENT TYPE:  AMB   LOCATION:  DAY                                  FACILITY:  [facility]   PHYSICIAN:  Allardt B. Earlene Plater, M.D.               DATE OF BIRTH:  Jun 25, 1960   DATE OF ADMISSION:  02/20/2003  DATE OF DISCHARGE:                                HISTORY & PHYSICAL   PREOP DIAGNOSES:  1. Menorrhagia.  2. Uterine fibroids.  3. Stress urinary incontinence.   INTENDED PROCEDURE:  Laparoscopically assisted vaginal hysterectomy and  sling with Dr. Patsi Sears.   HISTORY OF PRESENT ILLNESS:  This is a 50 year old African-American female  gravida 3, para 3, with a history of fibroids and menorrhagia.  Has had  associated urinary leakage.  Symptoms are worse with coughing and straining.  Has associated dyspareunia and dysmenorrhea, symptoms are moderate to  severe.  Has had a previous tubal ligation and desires hysterectomy.  Preop  endometrial biopsy performed without evidence of hyperplasia or malignancy.   PAST MEDICAL HISTORY:  Otherwise negative.   SURGICAL HISTORY:  Tubal ligation, left ovarian removal during pregnancy,  and benign breast biopsy.   MEDICATIONS:  Benadryl.   ALLERGIES:  ASPIRIN and PENICILLIN.   SOCIAL HISTORY:  One pack per week smoker.  No alcohol or other drugs.   FAMILY HISTORY:  Breast cancer, throat cancer, heart disease, diabetes, and  hypertension.   REVIEW OF SYSTEMS:  Otherwise noncontributory.   PHYSICAL EXAMINATION:  VITAL SIGNS:  Blood pressure 130/88, temperature 98,  weight 138.  GENERAL:  Alert and oriented in no acute distress.  SKIN:  Warm and dry, no lesions.  HEART:  Regular rate and rhythm.  LUNGS:  Clear to auscultation.  ABDOMEN:  Abdomen, liver and spleen are normal.  No hernia.  PELVIC:  Normal external genitalia.  Vagina and cervix normal.  Uterus 8  weeks, anterior, mobile, nontender.  No adnexal masses.   Mild cystocele and  rectocele noted.   PLAN:  Laparoscopically assisted vaginal hysterectomy with sling and  possible need for anterior or posterior repair.  Operative risks including  infection, bleeding, damage to the bowel, bladder, surrounding organs.  Also  discussed potential need to convert to abdominal hysterectomy.  All  questions answered, patient wishes to proceed.                                               Gerri Spore B. Earlene Plater, M.D.    WBD/MEDQ  D:  02/19/2003  T:  02/19/2003  Job:  [id number]

## 2013-02-08 DIAGNOSIS — I219 Acute myocardial infarction, unspecified: Secondary | ICD-10-CM

## 2013-02-08 HISTORY — DX: Acute myocardial infarction, unspecified: I21.9

## 2013-02-09 LAB — PULMONARY FUNCTION TEST

## 2013-02-10 HISTORY — PX: CORONARY ARTERY BYPASS GRAFT: SHX141

## 2013-05-10 ENCOUNTER — Encounter: Payer: Self-pay | Admitting: Cardiovascular Disease

## 2013-05-10 ENCOUNTER — Telehealth: Payer: Self-pay | Admitting: Cardiovascular Disease

## 2013-05-10 NOTE — Telephone Encounter (Signed)
NO show for appt. cdm

## 2013-05-10 NOTE — Progress Notes (Signed)
No show

## 2013-05-16 ENCOUNTER — Ambulatory Visit: Payer: Self-pay | Admitting: Family Medicine

## 2013-05-28 ENCOUNTER — Encounter: Payer: Self-pay | Admitting: Cardiovascular Disease

## 2013-05-30 ENCOUNTER — Ambulatory Visit: Payer: Self-pay | Admitting: Physician Assistant

## 2013-07-19 ENCOUNTER — Emergency Department (HOSPITAL_COMMUNITY): Payer: Self-pay

## 2013-07-19 ENCOUNTER — Observation Stay (HOSPITAL_COMMUNITY)
Admission: EM | Admit: 2013-07-19 | Discharge: 2013-07-22 | Disposition: A | Discharge status: Home / Self Care | Payer: Self-pay | Attending: Internal Medicine | Admitting: Internal Medicine

## 2013-07-19 ENCOUNTER — Encounter (HOSPITAL_COMMUNITY): Payer: Self-pay | Admitting: Emergency Medicine

## 2013-07-19 DIAGNOSIS — E669 Obesity, unspecified: Secondary | ICD-10-CM | POA: Insufficient documentation

## 2013-07-19 DIAGNOSIS — R079 Chest pain, unspecified: Principal | ICD-10-CM

## 2013-07-19 DIAGNOSIS — Z6831 Body mass index (BMI) 31.0-31.9, adult: Secondary | ICD-10-CM | POA: Insufficient documentation

## 2013-07-19 DIAGNOSIS — K5900 Constipation, unspecified: Secondary | ICD-10-CM | POA: Insufficient documentation

## 2013-07-19 DIAGNOSIS — I959 Hypotension, unspecified: Secondary | ICD-10-CM | POA: Insufficient documentation

## 2013-07-19 DIAGNOSIS — R209 Unspecified disturbances of skin sensation: Secondary | ICD-10-CM | POA: Insufficient documentation

## 2013-07-19 DIAGNOSIS — E785 Hyperlipidemia, unspecified: Secondary | ICD-10-CM | POA: Diagnosis present

## 2013-07-19 DIAGNOSIS — R110 Nausea: Secondary | ICD-10-CM | POA: Insufficient documentation

## 2013-07-19 DIAGNOSIS — E039 Hypothyroidism, unspecified: Secondary | ICD-10-CM | POA: Diagnosis present

## 2013-07-19 DIAGNOSIS — Z951 Presence of aortocoronary bypass graft: Secondary | ICD-10-CM | POA: Insufficient documentation

## 2013-07-19 DIAGNOSIS — I252 Old myocardial infarction: Secondary | ICD-10-CM | POA: Insufficient documentation

## 2013-07-19 DIAGNOSIS — F319 Bipolar disorder, unspecified: Secondary | ICD-10-CM

## 2013-07-19 DIAGNOSIS — E119 Type 2 diabetes mellitus without complications: Secondary | ICD-10-CM | POA: Insufficient documentation

## 2013-07-19 DIAGNOSIS — R0602 Shortness of breath: Secondary | ICD-10-CM | POA: Insufficient documentation

## 2013-07-19 DIAGNOSIS — Z23 Encounter for immunization: Secondary | ICD-10-CM | POA: Insufficient documentation

## 2013-07-19 DIAGNOSIS — Z87891 Personal history of nicotine dependence: Secondary | ICD-10-CM | POA: Insufficient documentation

## 2013-07-19 DIAGNOSIS — Z7982 Long term (current) use of aspirin: Secondary | ICD-10-CM | POA: Insufficient documentation

## 2013-07-19 DIAGNOSIS — G8929 Other chronic pain: Secondary | ICD-10-CM | POA: Insufficient documentation

## 2013-07-19 DIAGNOSIS — I2510 Atherosclerotic heart disease of native coronary artery without angina pectoris: Secondary | ICD-10-CM

## 2013-07-19 DIAGNOSIS — R61 Generalized hyperhidrosis: Secondary | ICD-10-CM | POA: Insufficient documentation

## 2013-07-19 HISTORY — DX: Acute myocardial infarction, unspecified: I21.9

## 2013-07-19 HISTORY — DX: Essential (primary) hypertension: I10

## 2013-07-19 HISTORY — DX: Unspecified asthma, uncomplicated: J45.909

## 2013-07-19 LAB — BASIC METABOLIC PANEL
BUN: 13 mg/dL (ref 6–23)
CO2: 25 mEq/L (ref 19–32)
CREATININE: 0.88 mg/dL (ref 0.50–1.10)
Calcium: 9.9 mg/dL (ref 8.4–10.5)
Chloride: 105 mEq/L (ref 96–112)
GFR, EST AFRICAN AMERICAN: 85 mL/min — AB (ref >90)
GFR, EST NON AFRICAN AMERICAN: 74 mL/min — AB (ref >90)
Glucose, Bld: 107 mg/dL — ABNORMAL HIGH (ref 70–99)
POTASSIUM: 4.2 meq/L (ref 3.7–5.3)
Sodium: 143 mEq/L (ref 137–147)

## 2013-07-19 LAB — CBC
HEMATOCRIT: 43.2 % (ref 36.0–46.0)
Hemoglobin: 14 g/dL (ref 12.0–15.0)
MCH: 27.7 pg (ref 26.0–34.0)
MCHC: 32.4 g/dL (ref 30.0–36.0)
MCV: 85.4 fL (ref 78.0–100.0)
PLATELETS: 336 10*3/uL (ref 150–400)
RBC: 5.06 MIL/uL (ref 3.87–5.11)
RDW: 15.7 % — ABNORMAL HIGH (ref 11.5–15.5)
WBC: 8.1 10*3/uL (ref 4.0–10.5)

## 2013-07-19 LAB — I-STAT TROPONIN, ED: Troponin i, poc: 0 ng/mL (ref 0.00–0.08)

## 2013-07-19 LAB — TSH: TSH: 6.03 u[IU]/mL — ABNORMAL HIGH (ref 0.350–4.500)

## 2013-07-19 LAB — MRSA PCR SCREENING: MRSA BY PCR: NEGATIVE

## 2013-07-19 LAB — TROPONIN I: Troponin I: <0.3 ng/mL (ref <0.30)

## 2013-07-19 MED ORDER — PROPRANOLOL HCL 20 MG PO TABS
20.0000 mg | ORAL_TABLET | Freq: Two times a day (BID) | ORAL | Status: DC | PRN
  Filled 2013-07-19: qty 1

## 2013-07-19 MED ORDER — NITROGLYCERIN 0.4 MG SL SUBL: 0.4000 mg | SUBLINGUAL_TABLET | SUBLINGUAL | Status: DC | PRN

## 2013-07-19 MED ORDER — CLOTRIMAZOLE 1 % EX CREA
1.0000 "application " | TOPICAL_CREAM | Freq: Two times a day (BID) | CUTANEOUS | Status: DC
  Administered 2013-07-19 – 2013-07-22 (×6): 1 via TOPICAL
  Filled 2013-07-19 (×2): qty 15

## 2013-07-19 MED ORDER — ONDANSETRON HCL 4 MG/2ML IJ SOLN: 4.0000 mg | Freq: Four times a day (QID) | INTRAMUSCULAR | Status: DC | PRN

## 2013-07-19 MED ORDER — ACETAMINOPHEN 650 MG RE SUPP: 650.0000 mg | Freq: Four times a day (QID) | RECTAL | Status: DC | PRN

## 2013-07-19 MED ORDER — MORPHINE SULFATE 2 MG/ML IJ SOLN
1.0000 mg | INTRAMUSCULAR | Status: DC | PRN
  Administered 2013-07-19 – 2013-07-20 (×4): 1 mg via INTRAVENOUS
  Filled 2013-07-19 (×5): qty 1

## 2013-07-19 MED ORDER — LEVOTHYROXINE SODIUM 88 MCG PO TABS
88.0000 ug | ORAL_TABLET | Freq: Every day | ORAL | Status: DC
  Administered 2013-07-20 – 2013-07-22 (×3): 88 ug via ORAL
  Filled 2013-07-19 (×6): qty 1

## 2013-07-19 MED ORDER — PNEUMOCOCCAL VAC POLYVALENT 25 MCG/0.5ML IJ INJ
0.5000 mL | INJECTION | INTRAMUSCULAR | Status: AC
  Administered 2013-07-21: 0.5 mL via INTRAMUSCULAR
  Filled 2013-07-19: qty 0.5

## 2013-07-19 MED ORDER — ONDANSETRON 4 MG PO TBDP
4.0000 mg | ORAL_TABLET | Freq: Once | ORAL | Status: AC
  Administered 2013-07-19: 4 mg via ORAL
  Filled 2013-07-19: qty 1

## 2013-07-19 MED ORDER — METHOCARBAMOL 500 MG PO TABS
1500.0000 mg | ORAL_TABLET | Freq: Three times a day (TID) | ORAL | Status: DC
  Administered 2013-07-19 – 2013-07-22 (×10): 1500 mg via ORAL
  Filled 2013-07-19 (×12): qty 3

## 2013-07-19 MED ORDER — NITROGLYCERIN 0.4 MG SL SUBL
0.4000 mg | SUBLINGUAL_TABLET | SUBLINGUAL | Status: DC | PRN
  Administered 2013-07-19 (×2): 0.4 mg via SUBLINGUAL

## 2013-07-19 MED ORDER — SODIUM CHLORIDE 0.9 % IV BOLUS (SEPSIS)
1000.0000 mL | Freq: Once | INTRAVENOUS | Status: AC
  Administered 2013-07-19: 1000 mL via INTRAVENOUS

## 2013-07-19 MED ORDER — MORPHINE SULFATE 2 MG/ML IJ SOLN
2.0000 mg | Freq: Once | INTRAMUSCULAR | Status: AC
  Administered 2013-07-19: 2 mg via INTRAVENOUS
  Filled 2013-07-19: qty 1

## 2013-07-19 MED ORDER — SODIUM CHLORIDE 0.9 % IJ SOLN
3.0000 mL | Freq: Two times a day (BID) | INTRAMUSCULAR | Status: DC
  Administered 2013-07-20 – 2013-07-22 (×3): 3 mL via INTRAVENOUS

## 2013-07-19 MED ORDER — QUETIAPINE FUMARATE 200 MG PO TABS
600.0000 mg | ORAL_TABLET | Freq: Every day | ORAL | Status: DC
  Filled 2013-07-19: qty 3

## 2013-07-19 MED ORDER — FUROSEMIDE 20 MG PO TABS
20.0000 mg | ORAL_TABLET | Freq: Every day | ORAL | Status: DC
  Administered 2013-07-20 – 2013-07-22 (×3): 20 mg via ORAL
  Filled 2013-07-19 (×3): qty 1

## 2013-07-19 MED ORDER — SODIUM CHLORIDE 0.9 % IV SOLN
INTRAVENOUS | Status: AC
  Administered 2013-07-19: 19:00:00 via INTRAVENOUS

## 2013-07-19 MED ORDER — GABAPENTIN 300 MG PO CAPS
600.0000 mg | ORAL_CAPSULE | Freq: Three times a day (TID) | ORAL | Status: DC
  Administered 2013-07-19 – 2013-07-22 (×10): 600 mg via ORAL
  Filled 2013-07-19 (×12): qty 2

## 2013-07-19 MED ORDER — POLYETHYLENE GLYCOL 3350 17 G PO PACK: 17.0000 g | PACK | Freq: Every day | ORAL | Status: DC | PRN

## 2013-07-19 MED ORDER — BUPROPION HCL ER (XL) 300 MG PO TB24
300.0000 mg | ORAL_TABLET | Freq: Every day | ORAL | Status: DC
  Administered 2013-07-20 – 2013-07-22 (×3): 300 mg via ORAL
  Filled 2013-07-19 (×5): qty 1

## 2013-07-19 MED ORDER — ACETAMINOPHEN 325 MG PO TABS: 650.0000 mg | ORAL_TABLET | Freq: Four times a day (QID) | ORAL | Status: DC | PRN

## 2013-07-19 MED ORDER — BUPROPION HCL ER (XL) 150 MG PO TB24
150.0000 mg | ORAL_TABLET | Freq: Every evening | ORAL | Status: DC
  Administered 2013-07-19 – 2013-07-22 (×4): 150 mg via ORAL
  Filled 2013-07-19 (×4): qty 1

## 2013-07-19 MED ORDER — QUETIAPINE FUMARATE 25 MG PO TABS
50.0000 mg | ORAL_TABLET | Freq: Two times a day (BID) | ORAL | Status: DC
  Administered 2013-07-19 – 2013-07-22 (×7): 50 mg via ORAL
  Filled 2013-07-19 (×9): qty 2

## 2013-07-19 MED ORDER — ASPIRIN EC 325 MG PO TBEC
325.0000 mg | DELAYED_RELEASE_TABLET | Freq: Every day | ORAL | Status: DC
  Administered 2013-07-20: 325 mg via ORAL
  Filled 2013-07-19: qty 1

## 2013-07-19 MED ORDER — ASPIRIN 81 MG PO CHEW
324.0000 mg | CHEWABLE_TABLET | Freq: Once | ORAL | Status: DC
  Filled 2013-07-19: qty 4

## 2013-07-19 MED ORDER — ONDANSETRON HCL 4 MG PO TABS: 4.0000 mg | ORAL_TABLET | Freq: Four times a day (QID) | ORAL | Status: DC | PRN

## 2013-07-19 MED ORDER — ALBUTEROL SULFATE (2.5 MG/3ML) 0.083% IN NEBU: 2.5000 mg | INHALATION_SOLUTION | Freq: Four times a day (QID) | RESPIRATORY_TRACT | Status: DC | PRN

## 2013-07-19 MED ORDER — ATORVASTATIN CALCIUM 20 MG PO TABS
20.0000 mg | ORAL_TABLET | Freq: Every day | ORAL | Status: DC
  Administered 2013-07-19: 20 mg via ORAL
  Filled 2013-07-19 (×3): qty 1

## 2013-07-19 MED ORDER — FENTANYL CITRATE 0.05 MG/ML IJ SOLN
50.0000 ug | Freq: Once | INTRAMUSCULAR | Status: AC
  Administered 2013-07-19: 50 ug via INTRAVENOUS
  Filled 2013-07-19: qty 2

## 2013-07-19 MED ORDER — CALCIUM CARBONATE-VITAMIN D 500-200 MG-UNIT PO TABS
1.0000 | ORAL_TABLET | Freq: Every day | ORAL | Status: DC
Start: 2013-07-19 — End: 2013-07-22
  Administered 2013-07-19 – 2013-07-22 (×4): 1 via ORAL
  Filled 2013-07-19 (×4): qty 1

## 2013-07-19 MED ORDER — QUETIAPINE FUMARATE 300 MG PO TABS
300.0000 mg | ORAL_TABLET | Freq: Every day | ORAL | Status: DC
  Administered 2013-07-19 – 2013-07-21 (×3): 300 mg via ORAL
  Filled 2013-07-19 (×5): qty 1

## 2013-07-19 MED ORDER — OXYCODONE-ACETAMINOPHEN 5-325 MG PO TABS
1.0000 | ORAL_TABLET | Freq: Every day | ORAL | Status: DC | PRN
  Administered 2013-07-19 – 2013-07-20 (×2): 1 via ORAL
  Filled 2013-07-19 (×3): qty 1

## 2013-07-19 MED ORDER — ENOXAPARIN SODIUM 40 MG/0.4ML ~~LOC~~ SOLN
40.0000 mg | SUBCUTANEOUS | Status: DC
  Administered 2013-07-19 – 2013-07-21 (×3): 40 mg via SUBCUTANEOUS
  Filled 2013-07-19 (×4): qty 0.4

## 2013-07-19 NOTE — ED Provider Notes (Signed)
I saw and evaluated the patient, reviewed the resident's note and I agree with the findings and plan.   EKG Interpretation   Date/Time:  Friday July 19 2013 10:54:16 EDT Ventricular Rate:  63 PR Interval:  180 QRS Duration: 78 QT Interval:  384 QTC Calculation: 392 R Axis:   55 Text Interpretation:  Normal sinus rhythm Possible Left atrial enlargement  ST \\T \ T wave abnormality, consider lateral ischemia that is new compared  to prior EKG Abnormal ECG Confirmed by Ilsa Bonello,  DO, Darrio Bade ([id number]) on  07/19/2013 11:11:08 AM      Pt is a 53 y.o. 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 EKG shows T wave inversions in lateral leads is new compared to EKG in 2010. Troponin is negative. Pain improves slightly with nitroglycerin but her pressure drops too low to continue nitroglycerin. We'll give fentanyl. Chest x-ray clear. She has no risk factors for pulmonary embolus other than age. I am not concerned for dissection at this time and she is equal pulses in all of her extremities, blood pressure stable. She denies any radiation of her pain into her back or abdomen. She states this pain feels similar to when she had to have her CABG. We'll request records from hospital in [place]. Discuss with cardiology who will see the patient in consult. We'll admit to medicine for chest pain rule out.  [place], DO 07/19/13 1319
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
## 2013-07-19 NOTE — ED Notes (Signed)
Attempted report 

## 2013-07-19 NOTE — ED Notes (Signed)
Pt crying, very upset over admission. Chaplain notified to come provide comfort.
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
## 2013-07-19 NOTE — Progress Notes (Signed)
Utilization Review Completed.Donne Anon T6/01/2014

## 2013-07-19 NOTE — ED Notes (Signed)
Pt states shes had off and on chest pain for the last week, did not come to be seen due to not wanting to be admitted. Pt states shes had a heart attack Jan 2nd of this year and had a triple bipass Jan 5th. Pt took one nitro a few days ago and it helped just a little bit. Pt did not take a second nitro due to being told she needed to come to the hospital if she takes 2 nitro. Pt takes 325 asa every morning and took it today.

## 2013-07-19 NOTE — ED Notes (Signed)
She states shes had CP, tightness and numbness radiating down L arm for past few days. She took nitro with some relief of pain but then it returns. She also c/o back pain that is chronic and worse this week

## 2013-07-19 NOTE — H&P (Signed)
[facility] History and Physical  Lindsay Rivera:[id number] DOB: [birth year] DOA: 07/19/2013  Referring physician: EDP PCP: No primary provider on file.   Chief Complaint: Chest pain  HPI: Lindsay Rivera is a 53 y.o. female with CAD status post three-vessel CABG January of 2015 in [place], diet controlled diabetes per her report, bipolar disorder and depression who presents with above complaints. She states that for the past one week she has had left precordial chest pain-described as a tightness 81/2 out of 10 in intensity at its worse, associated with shortness of breath, nausea and with radiation/paresthesia in to her left arm and diaphoresis. Troponins done in ED were negative. She had a chest x-ray which was negative for acute findings. Cardiology was consulted and she is admitted for further evaluation and management. In the ED she received nitroglycerin and her blood pressure dropped to the 90s but responded well to IV fluids. Her chest pain is persisting-remains at 8 the time of my visit.  Review of Systems The patient denies anorexia, fever, weight loss,, vision loss, decreased hearing, hoarseness, syncope,exertion, peripheral edema, balance deficits, hemoptysis, abdominal pain, melena, hematochezia, severe indigestion/heartburn, hematuria, incontinence, genital sores, muscle weakness, suspicious skin lesions, transient blindness, difficulty walking, depression, unusual weight change   Past Medical History  Diagnosis Date  . Depression   . Bipolar disorder   . History of hysterectomy   . 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 MI (myocardial infarction)    Past Surgical History  Procedure Laterality Date  . Coronary artery bypass graft     Social History:  reports that she has quit smoking. She does not have any smokeless tobacco history on file. She reports that she does not drink alcohol or use illicit drugs.  Allergies  Allergen Reactions  . Latex   . Penicillins      History reviewed. No pertinent family history.   Prior to Admission medications   Medication Sig Start Date End Date Taking? Authorizing Provider  albuterol (PROVENTIL HFA;VENTOLIN HFA) 108 (90 BASE) MCG/ACT inhaler Inhale 2 puffs into the lungs every 6 (six) hours as needed for wheezing.   Yes Historical Provider, MD  aspirin EC 325 MG tablet Take 325 mg by mouth daily.   Yes Historical Provider, MD  atorvastatin (LIPITOR) 20 MG tablet Take 20 mg by mouth at bedtime.   Yes Historical Provider, MD  buPROPion (WELLBUTRIN XL) 150 MG 24 hr tablet Take 150 mg by mouth every evening.   Yes Historical Provider, MD  buPROPion (WELLBUTRIN XL) 300 MG 24 hr tablet Take 300 mg by mouth every morning.   Yes Historical Provider, MD  Calcium Carbonate-Vitamin D 600-400 MG-UNIT per chew tablet Chew 1 tablet by mouth daily.   Yes Historical Provider, MD  clotrimazole (LOTRIMIN) 1 % cream Apply 1 application topically 2 (two) times daily.   Yes Historical Provider, MD  gabapentin (NEURONTIN) 300 MG capsule Take 600 mg by mouth 3 (three) times daily.   Yes Historical Provider, MD  ibuprofen (ADVIL,MOTRIN) 200 MG tablet Take 200 mg by mouth 4 (four) times daily.   Yes Historical Provider, MD  levothyroxine (SYNTHROID, LEVOTHROID) 88 MCG tablet Take 88 mcg by mouth daily.   Yes Historical Provider, MD  methocarbamol (ROBAXIN) 750 MG tablet Take 1,500 mg by mouth 3 (three) times daily.   Yes Historical Provider, MD  oxyCODONE-acetaminophen (PERCOCET) 7.5-325 MG per tablet Take 0.5 tablets by mouth daily as needed for pain.   Yes Historical Provider, MD  propranolol (INDERAL) 20 MG tablet Take 20 mg by mouth 2 (two) times daily as needed (anxiety).   Yes Historical Provider, MD  QUEtiapine (SEROQUEL) 400 MG tablet Take 600 mg by mouth at bedtime.   Yes Historical Provider, MD  QUEtiapine (SEROQUEL) 50 MG tablet Take 50 mg by mouth 2 (two) times daily.   Yes Historical Provider, MD  furosemide (LASIX) 20 MG tablet  Take 20 mg by mouth.    Historical Provider, MD   Physical Exam: Filed Vitals:   07/19/13 1404  BP: 112/80  Pulse:   Temp:   Resp:     BP 112/80  Pulse 66  Temp(Src) 98 F (36.7 C) (Oral)  Resp 15  Ht 5\' 2"  (1.575 m)  Wt 78.291 kg (172 lb 9.6 oz)  BMI 31.56 kg/m2  SpO2 99% Constitutional: Vital signs reviewed.  Patient is a well-developed and well-nourished  in no acute distress and cooperative with exam. Alert and oriented x3.  Head: Normocephalic and atraumatic Ear: TM normal bilaterally Nose: No erythema or drainage noted.  Turbinates normal Mouth: no erythema or exudates, MMM Eyes: PERRL, EOMI, conjunctivae normal, No scleral icterus.  Neck: Supple, Trachea midline normal ROM, No JVD, mass, thyromegaly, or carotid bruit present.  Cardiovascular: RRR, S1 normal, S2 normal, no MRG, pulses symmetric and intact bilaterally Pulmonary/Chest: normal respiratory effort, CTAB, no wheezes, rales, or rhonchi Abdominal: Soft. Non-tender, non-distended, bowel sounds are normal, no masses, organomegaly, or guarding present.  GU: no CVA tenderness  extremities: No cyanosis and no edema  Hematology: no cervical, inginal, or axillary adenopathy.  Neurological: A&O x3, Strength is normal and symmetric bilaterally, cranial nerve II-XII are grossly intact, no focal motor deficit, sensory intact to light touch bilaterally.  Skin: Warm, dry and intact. No rash, cyanosis, or clubbing.  Psychiatric: Normal mood and affect. speech and behavior is normal. Judgment and thought content normal.               Labs on Admission:  Basic Metabolic Panel:  Recent Labs Lab 07/19/13 1109  NA 143  K 4.2  CL 105  CO2 25  GLUCOSE 107*  BUN 13  CREATININE 0.88  CALCIUM 9.9   Liver Function Tests: No results found for this basename: AST, ALT, ALKPHOS, BILITOT, PROT, ALBUMIN,  in the last 168 hours No results found for this basename: LIPASE, AMYLASE,  in the last 168 hours No results found for  this basename: AMMONIA,  in the last 168 hours CBC:  Recent Labs Lab 07/19/13 1109  WBC 8.1  HGB 14.0  HCT 43.2  MCV 85.4  PLT 336   Cardiac Enzymes: No results found for this basename: CKTOTAL, CKMB, CKMBINDEX, TROPONINI,  in the last 168 hours  BNP (last 3 results) No results found for this basename: PROBNP,  in the last 8760 hours CBG: No results found for this basename: GLUCAP,  in the last 168 hours  Radiological Exams on Admission: Dg Chest 2 View  07/19/2013   CLINICAL DATA:  Left-sided chest pain.  Left arm numbness.  EXAM: CHEST  2 VIEW  COMPARISON:  Chest x-ray 02/20/2005.  FINDINGS: Lung volumes are normal. No consolidative airspace disease. No pleural effusions. Pulmonary vasculature is within normal limits. Heart size appears mildly enlarged. Upper mediastinal contours are within normal limits. Abandoned epicardial pacing wires are noted. Status post median sternotomy. Mild scarring in the right middle lobe.  IMPRESSION: 1. No radiographic evidence of acute cardiopulmonary disease. 2. Mild cardiomegaly.   Electronically Signed  By: Vinnie Langton M.D.   On: 07/19/2013 11:54    EKG: Independently reviewed. EKG with T wave inversion in 1 and aVL, no sinus rhythm  Assessment/Plan Principal Problem:   Chest pain with moderate risk of acute coronary syndrome -As discussed above, we'll cycle cardiac enzymes -Place on aspirin and when necessary nitroglycerin -Cardiology consulted and stress test planned for the a.m., follow. Active Problems:   CAD- CABG x 09 Feb 2013 ([place]) -Continue aspirin and Lipitor   Bipolar affective disorder- last admission 2010 -Continue outpatient medications   Dyslipidemia -Continue statin   Hypothyroid -Continue Synthroid      Code Status: Full Family Communication: None at bedside Disposition Plan: Admit to step down unit  Time spent: >68min  [facility] Pager [phone]

## 2013-07-20 ENCOUNTER — Observation Stay (HOSPITAL_COMMUNITY): Payer: Self-pay

## 2013-07-20 ENCOUNTER — Encounter (HOSPITAL_COMMUNITY): Payer: Self-pay

## 2013-07-20 DIAGNOSIS — R079 Chest pain, unspecified: Secondary | ICD-10-CM

## 2013-07-20 LAB — BASIC METABOLIC PANEL
BUN: 15 mg/dL (ref 6–23)
CHLORIDE: 103 meq/L (ref 96–112)
CO2: 26 mEq/L (ref 19–32)
CREATININE: 0.83 mg/dL (ref 0.50–1.10)
Calcium: 9.2 mg/dL (ref 8.4–10.5)
GFR calc non Af Amer: 79 mL/min — ABNORMAL LOW (ref >90)
Glucose, Bld: 114 mg/dL — ABNORMAL HIGH (ref 70–99)
POTASSIUM: 4.1 meq/L (ref 3.7–5.3)
SODIUM: 141 meq/L (ref 137–147)

## 2013-07-20 LAB — DRUGS OF ABUSE SCREEN W/O ALC, ROUTINE URINE
Amphetamine Screen, Ur: NEGATIVE
Barbiturate Quant, Ur: NEGATIVE
Benzodiazepines.: NEGATIVE
Cocaine Metabolites: NEGATIVE
Creatinine,U: 88.4 mg/dL
Marijuana Metabolite: NEGATIVE
Methadone: NEGATIVE
Opiate Screen, Urine: NEGATIVE
Phencyclidine (PCP): NEGATIVE
Propoxyphene: NEGATIVE

## 2013-07-20 LAB — LIPID PANEL
Cholesterol: 199 mg/dL (ref 0–200)
HDL: 32 mg/dL — ABNORMAL LOW (ref >39)
LDL CALC: UNDETERMINED mg/dL (ref 0–99)
Total CHOL/HDL Ratio: 6.2 RATIO
Triglycerides: 448 mg/dL — ABNORMAL HIGH (ref <150)
VLDL: UNDETERMINED mg/dL (ref 0–40)

## 2013-07-20 LAB — TROPONIN I: Troponin I: <0.3 ng/mL

## 2013-07-20 MED ORDER — IOHEXOL 350 MG/ML SOLN: 80.0000 mL | Freq: Once | INTRAVENOUS | Status: DC | PRN

## 2013-07-20 MED ORDER — IOHEXOL 300 MG/ML  SOLN
80.0000 mL | Freq: Once | INTRAMUSCULAR | Status: AC | PRN
  Administered 2013-07-20: 80 mL via INTRAVENOUS

## 2013-07-20 MED ORDER — ATORVASTATIN CALCIUM 80 MG PO TABS
80.0000 mg | ORAL_TABLET | Freq: Every day | ORAL | Status: DC
  Administered 2013-07-20 – 2013-07-21 (×2): 80 mg via ORAL
  Filled 2013-07-20 (×3): qty 1

## 2013-07-20 MED ORDER — TECHNETIUM TC 99M SESTAMIBI GENERIC - CARDIOLITE
10.0000 | Freq: Once | INTRAVENOUS | Status: AC | PRN
  Administered 2013-07-20: 10 via INTRAVENOUS

## 2013-07-20 MED ORDER — REGADENOSON 0.4 MG/5ML IV SOLN
INTRAVENOUS | Status: AC
  Filled 2013-07-20: qty 5

## 2013-07-20 MED ORDER — REGADENOSON 0.4 MG/5ML IV SOLN
0.4000 mg | Freq: Once | INTRAVENOUS | Status: AC
  Administered 2013-07-20: 0.4 mg via INTRAVENOUS
  Filled 2013-07-20: qty 5

## 2013-07-20 MED ORDER — SODIUM CHLORIDE 0.9 % IV BOLUS (SEPSIS)
500.0000 mL | Freq: Once | INTRAVENOUS | Status: AC
  Administered 2013-07-20: 500 mL via INTRAVENOUS

## 2013-07-20 MED ORDER — PANTOPRAZOLE SODIUM 40 MG PO TBEC
40.0000 mg | DELAYED_RELEASE_TABLET | Freq: Every day | ORAL | Status: DC
  Administered 2013-07-20 – 2013-07-22 (×3): 40 mg via ORAL
  Filled 2013-07-20 (×3): qty 1

## 2013-07-20 MED ORDER — TECHNETIUM TC 99M SESTAMIBI GENERIC - CARDIOLITE
30.0000 | Freq: Once | INTRAVENOUS | Status: AC | PRN
  Administered 2013-07-20: 30 via INTRAVENOUS

## 2013-07-20 MED ORDER — NAPROXEN 250 MG PO TABS
250.0000 mg | ORAL_TABLET | Freq: Two times a day (BID) | ORAL | Status: DC
  Administered 2013-07-20 – 2013-07-22 (×5): 250 mg via ORAL
  Filled 2013-07-20 (×6): qty 1

## 2013-07-20 MED ORDER — ASPIRIN EC 81 MG PO TBEC
81.0000 mg | DELAYED_RELEASE_TABLET | Freq: Every day | ORAL | Status: DC
  Administered 2013-07-21 – 2013-07-22 (×2): 81 mg via ORAL
  Filled 2013-07-20 (×2): qty 1

## 2013-07-20 MED ORDER — SODIUM CHLORIDE 0.9 % IV SOLN
INTRAVENOUS | Status: DC
  Administered 2013-07-20 (×2): via INTRAVENOUS

## 2013-07-20 NOTE — Progress Notes (Signed)
[facility] PROGRESS NOTE  AVIAH SORCI BHA:[id number] DOB: 1960/03/01 DOA: 07/19/2013 PCP: No primary provider on file.  Assessment/Plan: Chest pain with moderate risk of acute coronary syndrome  -As discussed above, we'll cycle cardiac enzymes  -Place on aspirin and when necessary nitroglycerin  -Stress test done-negative for ischemia, but chest pain persisting -Appreciate cardiology input, CT of chest ordered and NSAIDs added, will follow Active Problems:  CAD- CABG x 09 Feb 2013 (ND)  -Continue aspirin and Lipitor  Bipolar affective disorder- last admission 2010  -Continue outpatient medications  Dyslipidemia  -Continue statin  Hypothyroid  -Continue Synthroid   Code Status: Full Family Communication: None at bedside Disposition Plan: Home when medically ready   Consultants:  Cardiology  Procedures:  Nuclear medicine stress test-negative for ischemia  Antibiotics:  None  HPI/Subjective: Still with chest pain, states still not feeling good.  Objective: Filed Vitals:   07/20/13 1215  BP: 103/76  Pulse: 79  Temp:   Resp: 16    Intake/Output Summary (Last 24 hours) at 07/20/13 1433 Last data filed at 07/20/13 1247  Gross per 24 hour  Intake 1816.67 ml  Output   1000 ml  Net 816.67 ml   Filed Weights   07/19/13 1059 07/19/13 1600  Weight: 78.291 kg (172 lb 9.6 oz) 78.2 kg (172 lb 6.4 oz)    Exam:  General: alert & oriented x 3 In NAD Cardiovascular: RRR, nl S1 s2 Respiratory: CTAB Abdomen: soft +BS NT/ND, no masses palpable Extremities: No cyanosis and no edema    Data Reviewed: Basic Metabolic Panel:  Recent Labs Lab 07/19/13 1109 07/20/13 0145  NA 143 141  K 4.2 4.1  CL 105 103  CO2 25 26  GLUCOSE 107* 114*  BUN 13 15  CREATININE 0.88 0.83  CALCIUM 9.9 9.2   Liver Function Tests: No results found for this basename: AST, ALT, ALKPHOS, BILITOT, PROT, ALBUMIN,  in the last 168 hours No results found for this basename:  LIPASE, AMYLASE,  in the last 168 hours No results found for this basename: AMMONIA,  in the last 168 hours CBC:  Recent Labs Lab 07/19/13 1109  WBC 8.1  HGB 14.0  HCT 43.2  MCV 85.4  PLT 336   Cardiac Enzymes:  Recent Labs Lab 07/19/13 1435 07/19/13 2023 07/20/13 0145  TROPONINI <0.30 <0.30 <0.30   BNP (last 3 results) No results found for this basename: PROBNP,  in the last 8760 hours CBG: No results found for this basename: GLUCAP,  in the last 168 hours  Recent Results (from the past 240 hour(s))  MRSA PCR SCREENING     Status: None   Collection Time    07/19/13  6:22 PM      Result Value Ref Range Status   MRSA by PCR NEGATIVE  NEGATIVE Final   Comment:            The GeneXpert MRSA Assay (FDA     approved for NASAL specimens     only), is one component of a     comprehensive MRSA colonization     surveillance program. It is not     intended to diagnose MRSA     infection nor to guide or     monitor treatment for     MRSA infections.     Studies: Dg Chest 2 View  07/19/2013   CLINICAL DATA:  Left-sided chest pain.  Left arm numbness.  EXAM: CHEST  2 VIEW  COMPARISON:  Chest x-ray  02/20/2005.  FINDINGS: Lung volumes are normal. No consolidative airspace disease. No pleural effusions. Pulmonary vasculature is within normal limits. Heart size appears mildly enlarged. Upper mediastinal contours are within normal limits. Abandoned epicardial pacing wires are noted. Status post median sternotomy. Mild scarring in the right middle lobe.  IMPRESSION: 1. No radiographic evidence of acute cardiopulmonary disease. 2. Mild cardiomegaly.   Electronically Signed   By: Vinnie Langton M.D.   On: 07/19/2013 11:54   Nm Myocar Multi W/spect W/wall Motion / Ef  07/20/2013   CLINICAL DATA:  Chest pain  EXAM: MYOCARDIAL IMAGING WITH SPECT (REST AND PHARMACOLOGIC-STRESS)  GATED LEFT VENTRICULAR WALL MOTION STUDY  LEFT VENTRICULAR EJECTION FRACTION  TECHNIQUE: Standard myocardial  SPECT imaging was performed after resting intravenous injection of 10 mCi Tc-37m sestamibi. Subsequently, intravenous infusion of Lexiscan was performed under the supervision of the Cardiology staff. At peak effect of the drug, 30 mCi Tc-99m sestamibi was injected intravenously and standard myocardial SPECT imaging was performed. Quantitative gated imaging was also performed to evaluate left ventricular wall motion, and estimate left ventricular ejection fraction.  COMPARISON:  None.  FINDINGS: SPECT imaging demonstrates no reversible or irreversible defects to suggest ischemia or infarct.  Quantitative gated analysis shows normal wall motion.  The resting left ventricular ejection fraction is 81% with end-diastolic volume of 51 ml and end-systolic volume of 21 ml.  IMPRESSION: No evidence of ischemia or infarction.  Ejection fraction 60%.   Electronically Signed   By: Rolm Baptise M.D.   On: 07/20/2013 13:10    Scheduled Meds: . [START ON 07/21/2013] aspirin EC  81 mg Oral Daily  . atorvastatin  80 mg Oral QHS  . buPROPion  150 mg Oral QPM  . buPROPion  300 mg Oral Q breakfast  . calcium-vitamin D  1 tablet Oral Daily  . clotrimazole  1 application Topical BID  . enoxaparin (LOVENOX) injection  40 mg Subcutaneous Q24H  . furosemide  20 mg Oral Daily  . gabapentin  600 mg Oral TID  . levothyroxine  88 mcg Oral QAC breakfast  . methocarbamol  1,500 mg Oral TID  . naproxen  250 mg Oral BID WC  . pantoprazole  40 mg Oral Q0600  . pneumococcal 23 valent vaccine  0.5 mL Intramuscular Tomorrow-1000  . QUEtiapine  300 mg Oral QHS  . QUEtiapine  50 mg Oral BID WC  . regadenoson      . sodium chloride  3 mL Intravenous Q12H   Continuous Infusions:   Principal Problem:   Chest pain with moderate risk of acute coronary syndrome Active Problems:   CAD- CABG x 09 Feb 2013 (ND)   Bipolar affective disorder- last admission 2010   Obesity- BMI 31   Dyslipidemia   Hypothyroid   Chest pain    Time  spent: [facility] Pager [phone]. If 7PM-7AM, please contact night-coverage at [email or web address], password [facility] 07/20/2013, 2:33 PM  LOS: 1 day

## 2013-07-20 NOTE — Progress Notes (Signed)
At midnight pt's BP was systolic of 28'M via monitor and pt was complaining of dizziness. NP on called notified and ordered pt for a 500cc bolus at that time. Shortly after 0100 monitor registered a BP of 03'K systolic. Manual BP found BP to be 99 systolic and so BP cuff was changed and switched to other arm. Again at 0400 BP remained soft and so NP was called again and orders received to maintain NS at 50cc/hr at this time. Pt is sleeping soundly, easy to arouse. Will continue to monitor.

## 2013-07-20 NOTE — Clinical Social Work Psychosocial (Signed)
Clinical Social Work Department BRIEF PSYCHOSOCIAL ASSESSMENT 07/20/2013  Patient:  Lindsay Rivera, Lindsay Rivera     Account Number:  [id number]     Admit date:  07/19/2013  Clinical Social Worker:  Hubert Azure  Date/Time:  07/20/2013 04:57 PM  Referred by:  Physician  Date Referred:  07/20/2013 Referred for  Other - See comment   Other Referral:   Community resources   Interview type:  Patient Other interview type:    PSYCHOSOCIAL DATA Living Status:  SIGNIFICANT OTHER Admitted from facility:   Level of care:   Primary support name:  [name] Primary support relationship to patient:  PARTNER Degree of support available:   Good    CURRENT CONCERNS Current Concerns  Other - See comment   Other Concerns:   Community resources    SOCIAL WORK ASSESSMENT / PLAN CSW met with patient who was alert and oriented x4. CSW introduced self and explained role. CSW discussed patient need for community resources. Per patient, she did not have insurance when she relocated back to [facility]. Patient reported going to DSS to complete an application for Medicaid and was told she could not apply because she was a single adult. Patient stated she then applied for disability and the medical insurance that comes with it. Per patient, her 90 day waiting period is almost over and she will know what the decision is. Patient additionally stated she has spoken with [facility] about medication assistance.   Assessment/plan status:  Information/Referral to [employer] Other assessment/ plan:   Information/referral to community resources:   CSW provided patient with [employer] and Wellness information. RNCM aware of need for medication assistance.     PATIENT'S/FAMILY'S RESPONSE TO PLAN OF CARE: Patient thanked CSW for assistance with information for community health care treatment.   [place], [place] Weekend Clinical Social Worker [phone]

## 2013-07-20 NOTE — Progress Notes (Addendum)
  Progress Note   Subjective:  Still complaining of chest pain.   Objective:  Filed Vitals:   07/20/13 0120 07/20/13 0420 07/20/13 0743 07/20/13 0900  BP: 100/63 90/57 98/57  117/78  Pulse: 67 70 72   Temp:   97.8 F (36.6 C)   TempSrc:   Oral   Resp: 12 12 13 13   Height:      Weight:      SpO2: 97% 94% 96%     Intake/Output from previous day:  Intake/Output Summary (Last 24 hours) at 07/20/13 1108 Last data filed at 07/20/13 0630  Gross per 24 hour  Intake 2502.5 ml  Output   1000 ml  Net 1502.5 ml    PHYSICAL EXAM: No acute distress Neck: no JVD Cardiac:  normal S1, S2; RRR; no murmur Chest:  Tender to touch Lungs:  clear to auscultation bilaterally, no wheezing, rhonchi or rales Abd: soft, nontender, no hepatomegaly Ext: no edema Skin: warm and dry Neuro:  CNs 2-12 intact, no focal abnormalities noted   Lab Results:  Basic Metabolic Panel:  Recent Labs  07/19/13 1109 07/20/13 0145  NA 143 141  K 4.2 4.1  CL 105 103  CO2 25 26  GLUCOSE 107* 114*  BUN 13 15  CREATININE 0.88 0.83  CALCIUM 9.9 9.2    CBC:  Recent Labs  07/19/13 1109  WBC 8.1  HGB 14.0  HCT 43.2  MCV 85.4  PLT 336    Cardiac Enzymes:  Recent Labs  07/19/13 1435 07/19/13 2023 07/20/13 0145  TROPONINI <0.30 <0.30 <0.30     Assessment/Plan:   1. Chest Pain:  MI ruled out.  Myoview planned for today.  ? MSK chest pain. 2. CAD s/p CABG in 02/2013 in [place]:   Continue ASA and statin.  Myoview done today.   3. Bipolar Disorder 4. HLD:  Continue statin.  Consider increasing dose with elevated Trigs.  5. Disposition:  Lexiscan Myoview done today.  Tolerated injection.  ECG without changes from baseline.  Images pending. 6. Hypotension 7. Sternal discomfort  Richardson Dopp, PA-C   07/20/2013 11:08 AM  Pager # [phone]   Agree that this is not consistent with angina and more likely chest wall Sternum does not look infected, but am worried given chills Discussed  with Dr Ron Agee  CT with contrast can highlight evidence of substernal fluid collections--will order  Will stop narcotics Begin naproxyn and PPI   Will increase statin Decrease asa Ok to transfer to floor

## 2013-07-20 NOTE — Progress Notes (Addendum)
Updated pt that she will be going to nuc med for her scheduled myloscan this morning - pt's morning meds administered per MD order.  Pt reports a 9 out of 10 chronic back pain with "the usual chest pain".  Pt taken off the monitor, utilized the restroom and was escorted via wheelchair by transport.  Reminded the pt of the importance of not getting up without assistance - pt verbalizes understanding and has no questions or concerns at this time.  Ticket to ride, printed and given to transportation.  Report/update given to nuc med.  Central monitoring and eLink updated that pt will be off the unit for testing.

## 2013-07-20 NOTE — Care Management Note (Addendum)
    Page 1 of 1   07/22/2013     10:18:22 AM CARE MANAGEMENT NOTE 07/22/2013  Patient:  Lindsay Rivera, Lindsay Rivera   Account Number:  [id number]  Date Initiated:  07/19/2013  Documentation initiated by:  Elissa Hefty  Subjective/Objective Assessment:   adm  w ch pain     Action/Plan:   pt lives at home   Anticipated DC Date:  07/22/2013   Anticipated DC Plan:  HOME/SELF CARE  In-house referral  Clinical Social Worker      DC Forensic scientist  CM consult  [facility]      Choice offered to / List presented to:             Status of service:   Medicare Important Message given?   (If response is "NO", the following Medicare IM given date fields will be blank) Date Medicare IM given:   Date Additional Medicare IM given:    Discharge Disposition:  HOME/SELF CARE  Per UR Regulation:  Reviewed for med. necessity/level of care/duration of stay  If discussed at [place] of Stay Meetings, dates discussed:    Comments:  07/22/13  1015a debbie Lyanne Kates rn,bsn spoke w pt and gave her inform on [facility]. enc pt to go there after disch and take prescriptions and to set up appt. gavept prescription discount card that may help w brand name meds.  07-20-13 1510 Jacqlyn Krauss, [place] [phone] Once pt is stable for d/c please consider placing pt on $4.00 meds from [facility] if possible. Please look at cheaper statin. Pt without insurance. CM will continue to monitor to see if pt will need hospital f/u.

## 2013-07-20 NOTE — Progress Notes (Signed)
Patient Name: Lindsay Rivera      SUBJECTIVE: 53 yo woman with CAD and CABG 1/15 [facility]) admitted with CP and new TW inversions ( vs 2010) Has hx of chronic pain  Has r/o and is undergoing  myoview this am Past Medical History  Diagnosis Date  . Depression   . Bipolar disorder   . History of hysterectomy   . 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 MI (myocardial infarction)     Scheduled Meds:  Scheduled Meds: . aspirin EC  325 mg Oral Daily  . atorvastatin  20 mg Oral QHS  . buPROPion  150 mg Oral QPM  . buPROPion  300 mg Oral Q breakfast  . calcium-vitamin D  1 tablet Oral Daily  . clotrimazole  1 application Topical BID  . enoxaparin (LOVENOX) injection  40 mg Subcutaneous Q24H  . furosemide  20 mg Oral Daily  . gabapentin  600 mg Oral TID  . levothyroxine  88 mcg Oral QAC breakfast  . methocarbamol  1,500 mg Oral TID  . pneumococcal 23 valent vaccine  0.5 mL Intramuscular Tomorrow-1000  . QUEtiapine  300 mg Oral QHS  . QUEtiapine  50 mg Oral BID WC  . regadenoson      . regadenoson  0.4 mg Intravenous Once  . sodium chloride  3 mL Intravenous Q12H   Continuous Infusions: . sodium chloride 50 mL/hr at 07/20/13 0900    PHYSICAL EXAM Filed Vitals:   07/20/13 0120 07/20/13 0420 07/20/13 0743 07/20/13 0900  BP: 100/63 90/57 98/57  117/78  Pulse: 67 70 72   Temp:   97.8 F (36.6 C)   TempSrc:   Oral   Resp: 12 12 13 13   Height:      Weight:      SpO2: 97% 94% 96%         Intake/Output Summary (Last 24 hours) at 07/20/13 1100 Last data filed at 07/20/13 0630  Gross per 24 hour  Intake 2502.5 ml  Output   1000 ml  Net 1502.5 ml    LABS: Basic Metabolic Panel:  Recent Labs Lab 07/19/13 1109 07/20/13 0145  NA 143 141  K 4.2 4.1  CL 105 103  CO2 25 26  GLUCOSE 107* 114*  BUN 13 15  CREATININE 0.88 0.83  CALCIUM 9.9 9.2   Cardiac Enzymes:  Recent Labs  07/19/13 1435 07/19/13 2023 07/20/13 0145  TROPONINI <0.30 <0.30 <0.30    CBC:  Recent Labs Lab 07/19/13 1109  WBC 8.1  HGB 14.0  HCT 43.2  MCV 85.4  PLT 336   PROTIME: No results found for this basename: LABPROT, INR,  in the last 72 hours Liver Function Tests: No results found for this basename: AST, ALT, ALKPHOS, BILITOT, PROT, ALBUMIN,  in the last 72 hours No results found for this basename: LIPASE, AMYLASE,  in the last 72 hours BNP: BNP (last 3 results) No results found for this basename: PROBNP,  in the last 8760 hours D-Dimer: No results found for this basename: DDIMER,  in the last 72 hours Hemoglobin A1C: No results found for this basename: HGBA1C,  in the last 72 hours Fasting Lipid Panel:  Recent Labs  07/20/13 0145  CHOL 199  HDL 32*  LDLCALC UNABLE TO CALCULATE IF TRIGLYCERIDE OVER 400 mg/dL  TRIG 448*  CHOLHDL 6.2   Thyroid Function Tests:  Recent Labs  07/19/13 1435  TSH 6.030*   Anemia Panel: No results found  for this basename: VITAMINB12, FOLATE, FERRITIN, TIBC, IRON, RETICCTPCT,  in the last 72 hours   ASSESSMENT AND PLAN:  Principal Problem:   Chest pain with moderate risk of acute coronary syndrome Active Problems:   CAD- CABG x 09 Feb 2013 (ND)   Bipolar affective disorder- last admission 2010   Obesity- BMI 31   Dyslipidemia   Hypothyroid   Chest pain    Signed, Virl Axe MD  07/20/2013  See ptjer note

## 2013-07-21 DIAGNOSIS — E039 Hypothyroidism, unspecified: Secondary | ICD-10-CM

## 2013-07-21 LAB — GLUCOSE, CAPILLARY
Glucose-Capillary: 106 mg/dL — ABNORMAL HIGH (ref 70–99)
Glucose-Capillary: 121 mg/dL — ABNORMAL HIGH (ref 70–99)

## 2013-07-21 LAB — BASIC METABOLIC PANEL
BUN: 15 mg/dL (ref 6–23)
CO2: 22 mEq/L (ref 19–32)
CREATININE: 0.95 mg/dL (ref 0.50–1.10)
Calcium: 9.5 mg/dL (ref 8.4–10.5)
Chloride: 101 mEq/L (ref 96–112)
GFR calc Af Amer: 78 mL/min — ABNORMAL LOW (ref >90)
GFR calc non Af Amer: 67 mL/min — ABNORMAL LOW (ref >90)
Glucose, Bld: 121 mg/dL — ABNORMAL HIGH (ref 70–99)
POTASSIUM: 4.1 meq/L (ref 3.7–5.3)
Sodium: 141 mEq/L (ref 137–147)

## 2013-07-21 LAB — D-DIMER, QUANTITATIVE: D-Dimer, Quant: 0.27 ug{FEU}/mL (ref 0.00–0.48)

## 2013-07-21 MED ORDER — OXYCODONE-ACETAMINOPHEN 5-325 MG PO TABS
1.0000 | ORAL_TABLET | Freq: Two times a day (BID) | ORAL | Status: DC | PRN
  Administered 2013-07-21 – 2013-07-22 (×3): 1 via ORAL
  Filled 2013-07-21 (×3): qty 1

## 2013-07-21 MED ORDER — OXYCODONE-ACETAMINOPHEN 5-325 MG PO TABS
1.0000 | ORAL_TABLET | ORAL | Status: DC | PRN
  Administered 2013-07-21: 1 via ORAL
  Filled 2013-07-21: qty 1

## 2013-07-21 MED ORDER — KETOROLAC TROMETHAMINE 30 MG/ML IJ SOLN
30.0000 mg | Freq: Four times a day (QID) | INTRAMUSCULAR | Status: DC | PRN
  Administered 2013-07-21 – 2013-07-22 (×2): 30 mg via INTRAVENOUS
  Filled 2013-07-21 (×2): qty 1

## 2013-07-21 MED ORDER — BISACODYL 5 MG PO TBEC
10.0000 mg | DELAYED_RELEASE_TABLET | Freq: Every day | ORAL | Status: DC | PRN
  Administered 2013-07-22: 10 mg via ORAL
  Filled 2013-07-21: qty 2

## 2013-07-21 MED ORDER — SENNA 8.6 MG PO TABS
2.0000 | ORAL_TABLET | Freq: Every day | ORAL | Status: DC
  Administered 2013-07-21 – 2013-07-22 (×2): 17.2 mg via ORAL
  Filled 2013-07-21 (×2): qty 2

## 2013-07-21 MED ORDER — POLYETHYLENE GLYCOL 3350 17 G PO PACK: 17.0000 g | PACK | Freq: Every day | ORAL | Status: DC

## 2013-07-21 NOTE — Progress Notes (Signed)
  Progress Note   Subjective:  Still complaining of chest pain. myoview neg Most consistent with chest wall pain  NSAIDS started Cehst CT showed partial healing of the sternum but no evidence of abscess formation    Objective:  Filed Vitals:   07/20/13 2012 07/20/13 2343 07/21/13 0350 07/21/13 0830  BP: 100/64 90/52 100/66 117/72  Pulse:      Temp:  97.8 F (36.6 C) 98 F (36.7 C) 97.9 F (36.6 C)  TempSrc:  Oral Oral Oral  Resp: 14 15 13 13   Height:      Weight:      SpO2:  98% 98% 96%    Intake/Output from previous day:  Intake/Output Summary (Last 24 hours) at 07/21/13 0929 Last data filed at 07/21/13 0900  Gross per 24 hour  Intake 1694.17 ml  Output      0 ml  Net 1694.17 ml    PHYSICAL EXAM: No acute distress Neck: no JVD Cardiac:  normal S1, S2; RRR; no murmur Chest:  Tender to touch Lungs:  clear to auscultation bilaterally, no wheezing, rhonchi or rales Abd: soft, nontender, no hepatomegaly Ext: no edema Skin: warm and dry Neuro:  CNs 2-12 intact, no focal abnormalities noted   Lab Results:  Basic Metabolic Panel:  Recent Labs  07/20/13 0145 07/21/13 0436  NA 141 141  K 4.1 4.1  CL 103 101  CO2 26 22  GLUCOSE 114* 121*  BUN 15 15  CREATININE 0.83 0.95  CALCIUM 9.2 9.5    CBC:  Recent Labs  07/19/13 1109  WBC 8.1  HGB 14.0  HCT 43.2  MCV 85.4  PLT 336    Cardiac Enzymes:  Recent Labs  07/19/13 1435 07/19/13 2023 07/20/13 0145  TROPONINI <0.30 <0.30 <0.30     Assessment/Plan:   1. Chest Pain:  MI ruled out.  Myoview planned for today.  ? MSK chest pain. 2. CAD s/p CABG in 02/2013 in [place]:   Continue ASA and statin.  Myoview done today.   3. Bipolar Disorder 4. HLD:  Continue statin.  Consider increasing dose with elevated Trigs.  5. Disposition:  Lexiscan Myoview done today.  Tolerated injection.  ECG without changes from baseline.  Images pending. 6. Hypotension 7. Sternal discomfort   Agree that this is  not consistent with angina and more likely chest wall Sternum eval was neg Would  stop narcotics Begin naproxyn and PPI  We will sign off call for further queations

## 2013-07-21 NOTE — Progress Notes (Signed)
[facility] PROGRESS NOTE  OSA FOGARTY VOZ:[id number] DOB: 01-14-1961 DOA: 07/19/2013 PCP: No primary provider on file.  Assessment/Plan: Chest pain with moderate risk of acute coronary syndrome  -As discussed above, we'll cycle cardiac enzymes  -Place on aspirin and when necessary nitroglycerin  -Stress test done-negative for ischemia, but chest pain persisting -Appreciate cardiology input, CT of chest negative for evidence of infection -Obtain d-dimer and follow -add Toradol when necessary for pain management and follow Active Problems:  CAD- CABG x 09 Feb 2013 (ND)  -Continue aspirin and Lipitor  Bipolar affective disorder- last admission 2010  -Continue outpatient medications  Dyslipidemia  -Continue statin  Hypothyroid  -Continue Synthroid   Code Status: Full Family Communication: None at bedside Disposition Plan: Home when medically ready   Consultants:  Cardiology  Procedures:  Nuclear medicine stress test-negative for ischemia  Antibiotics:  None  HPI/Subjective: Still with chest pain, denies shortness of breath  Objective: Filed Vitals:   07/21/13 0830  BP: 117/72  Pulse:   Temp: 97.9 F (36.6 C)  Resp: 13    Intake/Output Summary (Last 24 hours) at 07/21/13 1120 Last data filed at 07/21/13 0900  Gross per 24 hour  Intake 1694.17 ml  Output      0 ml  Net 1694.17 ml   Filed Weights   07/19/13 1059 07/19/13 1600  Weight: 78.291 kg (172 lb 9.6 oz) 78.2 kg (172 lb 6.4 oz)    Exam:  General: alert & oriented x 3 In NAD Cardiovascular: RRR, nl S1 s2 Respiratory: CTAB Abdomen: soft +BS NT/ND, no masses palpable Extremities: No cyanosis and no edema    Data Reviewed: Basic Metabolic Panel:  Recent Labs Lab 07/19/13 1109 07/20/13 0145 07/21/13 0436  NA 143 141 141  K 4.2 4.1 4.1  CL 105 103 101  CO2 25 26 22   GLUCOSE 107* 114* 121*  BUN 13 15 15   CREATININE 0.88 0.83 0.95  CALCIUM 9.9 9.2 9.5   Liver Function  Tests: No results found for this basename: AST, ALT, ALKPHOS, BILITOT, PROT, ALBUMIN,  in the last 168 hours No results found for this basename: LIPASE, AMYLASE,  in the last 168 hours No results found for this basename: AMMONIA,  in the last 168 hours CBC:  Recent Labs Lab 07/19/13 1109  WBC 8.1  HGB 14.0  HCT 43.2  MCV 85.4  PLT 336   Cardiac Enzymes:  Recent Labs Lab 07/19/13 1435 07/19/13 2023 07/20/13 0145  TROPONINI <0.30 <0.30 <0.30   BNP (last 3 results) No results found for this basename: PROBNP,  in the last 8760 hours CBG: No results found for this basename: GLUCAP,  in the last 168 hours  Recent Results (from the past 240 hour(s))  MRSA PCR SCREENING     Status: None   Collection Time    07/19/13  6:22 PM      Result Value Ref Range Status   MRSA by PCR NEGATIVE  NEGATIVE Final   Comment:            The GeneXpert MRSA Assay (FDA     approved for NASAL specimens     only), is one component of a     comprehensive MRSA colonization     surveillance program. It is not     intended to diagnose MRSA     infection nor to guide or     monitor treatment for     MRSA infections.     Studies: Dg Chest  2 View  07/19/2013   CLINICAL DATA:  Left-sided chest pain.  Left arm numbness.  EXAM: CHEST  2 VIEW  COMPARISON:  Chest x-ray 02/20/2005.  FINDINGS: Lung volumes are normal. No consolidative airspace disease. No pleural effusions. Pulmonary vasculature is within normal limits. Heart size appears mildly enlarged. Upper mediastinal contours are within normal limits. Abandoned epicardial pacing wires are noted. Status post median sternotomy. Mild scarring in the right middle lobe.  IMPRESSION: 1. No radiographic evidence of acute cardiopulmonary disease. 2. Mild cardiomegaly.   Electronically Signed   By: Vinnie Langton M.D.   On: 07/19/2013 11:54   Ct Chest W Contrast  07/20/2013   CLINICAL DATA:  Chest tenderness. Evaluate for sternal wound infection.  EXAM: CT  CHEST WITH CONTRAST  TECHNIQUE: Multidetector CT imaging of the chest was performed during intravenous contrast administration.  CONTRAST:  73mL OMNIPAQUE IOHEXOL 300 MG/ML  SOLN  COMPARISON:  07/19/2013 and CT from 6/4/5  FINDINGS: No pleural effusion identified. Scar is identified within the anterior left upper lobe. Left upper lobe nodule measures 4 mm, image 18/series 3.  The patient is status post median sternotomy and CABG procedure. There appears to be at least partial bony bridging of the sternotomy wound. No evidence for dehiscence. No peristernal fluid collections identified. No gas or fluid collections identified within the anterior mediastinum. The heart size is normal. No pericardial effusion. Right hilar node measures 1.4 cm, image 23/series 2. No mediastinal or left hilar adenopathy.  There is no enlarged axillary or supraclavicular adenopathy. Incidental imaging through the upper abdomen is on unremarkable. Review of the visualized bony structures is significant for mild spondylosis within the thoracic spine.  IMPRESSION: 1. There has been at least partial fusion of the sternotomy defect compatible with healing. No peristernal fluid collections or a gas identified to suggest abscess. 2. Borderline enlarged right hilar lymph node is of on certain clinical significance. 3. Left upper lobe pulmonary nodule measures 4 mm. Unchanged from 2005 and most likely benign.   Electronically Signed   By: Kerby Moors M.D.   On: 07/20/2013 15:54   Nm Myocar Multi W/spect W/wall Motion / Ef  07/20/2013   CLINICAL DATA:  Chest pain  EXAM: MYOCARDIAL IMAGING WITH SPECT (REST AND PHARMACOLOGIC-STRESS)  GATED LEFT VENTRICULAR WALL MOTION STUDY  LEFT VENTRICULAR EJECTION FRACTION  TECHNIQUE: Standard myocardial SPECT imaging was performed after resting intravenous injection of 10 mCi Tc-77m sestamibi. Subsequently, intravenous infusion of Lexiscan was performed under the supervision of the Cardiology staff. At peak  effect of the drug, 30 mCi Tc-63m sestamibi was injected intravenously and standard myocardial SPECT imaging was performed. Quantitative gated imaging was also performed to evaluate left ventricular wall motion, and estimate left ventricular ejection fraction.  COMPARISON:  None.  FINDINGS: SPECT imaging demonstrates no reversible or irreversible defects to suggest ischemia or infarct.  Quantitative gated analysis shows normal wall motion.  The resting left ventricular ejection fraction is 67% with end-diastolic volume of 51 ml and end-systolic volume of 21 ml.  IMPRESSION: No evidence of ischemia or infarction.  Ejection fraction 60%.   Electronically Signed   By: Rolm Baptise M.D.   On: 07/20/2013 13:10    Scheduled Meds: . aspirin EC  81 mg Oral Daily  . atorvastatin  80 mg Oral QHS  . buPROPion  150 mg Oral QPM  . buPROPion  300 mg Oral Q breakfast  . calcium-vitamin D  1 tablet Oral Daily  . clotrimazole  1 application  Topical BID  . enoxaparin (LOVENOX) injection  40 mg Subcutaneous Q24H  . furosemide  20 mg Oral Daily  . gabapentin  600 mg Oral TID  . levothyroxine  88 mcg Oral QAC breakfast  . methocarbamol  1,500 mg Oral TID  . naproxen  250 mg Oral BID WC  . pantoprazole  40 mg Oral Q0600  . QUEtiapine  300 mg Oral QHS  . QUEtiapine  50 mg Oral BID WC  . senna  2 tablet Oral Daily  . sodium chloride  3 mL Intravenous Q12H   Continuous Infusions:   Principal Problem:   Chest pain with moderate risk of acute coronary syndrome Active Problems:   CAD- CABG x 09 Feb 2013 (ND)   Bipolar affective disorder- last admission 2010   Obesity- BMI 31   Dyslipidemia   Hypothyroid   Chest pain    Time spent: [facility] Pager [phone]. If 7PM-7AM, please contact night-coverage at [email or web address], password [facility] 07/21/2013, 11:20 AM  LOS: 2 days

## 2013-07-22 DIAGNOSIS — E785 Hyperlipidemia, unspecified: Secondary | ICD-10-CM

## 2013-07-22 MED ORDER — IBUPROFEN 200 MG PO TABS: 200.0000 mg | ORAL_TABLET | Freq: Four times a day (QID) | ORAL | Status: DC | PRN

## 2013-07-22 MED ORDER — SENNA 8.6 MG PO TABS: 2.0000 | ORAL_TABLET | Freq: Every day | ORAL | Status: DC

## 2013-07-22 MED ORDER — SORBITOL 70 % SOLN
960.0000 mL | TOPICAL_OIL | Freq: Once | ORAL | Status: AC
  Administered 2013-07-22: 960 mL via RECTAL
  Filled 2013-07-22: qty 240

## 2013-07-22 MED ORDER — OMEPRAZOLE 40 MG PO CPDR: 40.0000 mg | DELAYED_RELEASE_CAPSULE | Freq: Two times a day (BID) | ORAL | Status: DC

## 2013-07-22 NOTE — Progress Notes (Signed)
UR Completed.  Jeffrey Graefe Jane [phone] 07/22/2013  

## 2013-07-22 NOTE — Discharge Summary (Signed)
Physician Discharge Summary  [name]:[id number] DOB: 07-Dec-1960 DOA: 07/19/2013  PCP: No primary provider on file.  Admit date: 07/19/2013 Discharge date: 07/22/2013  Time spent: >30 minutes  Recommendations for Outpatient Follow-up:  Follow-up Information   Please follow up. (PCP at [facility] in [place], call for appt upon discharge. )       Follow up with HILTY,Kenneth C, MD. (2weeks, call for appt upon discharge)    Specialty:  Cardiology   Contact information:   [place] [address] [phone]        Discharge Diagnoses:  Principal Problem:   Chest pain with moderate risk of acute coronary syndrome Active Problems:   CAD- CABG x 09 Feb 2013 (ND)   Bipolar affective disorder- last admission 2010   Obesity- BMI 31   Dyslipidemia   Hypothyroid   Chest pain   Discharge Condition: Improved/stable  Diet recommendation: Low sodium heart healthy  Filed Weights   07/19/13 1059 07/19/13 1600  Weight: 78.291 kg (172 lb 9.6 oz) 78.2 kg (172 lb 6.4 oz)    History of present illness:  Lindsay Rivera is a 53 y.o. female with CAD status post three-vessel CABG January of 2015 in [place], diet controlled diabetes per her report, bipolar disorder and depression who presents with above complaints. She states that for the past one week she has had left precordial chest pain-described as a tightness 81/2 out of 10 in intensity at its worse, associated with shortness of breath, nausea and with radiation/paresthesia in to her left arm and diaphoresis. Troponins done in ED were negative. She had a chest x-ray which was negative for acute findings. Cardiology was consulted and she is admitted for further evaluation and management.  In the ED she received nitroglycerin and her blood pressure dropped to the 90s but responded well to IV fluids. Her chest pain was persisting. She was admitted for further evaluation and  management.   Hospital Course:  Chest pain with moderate risk of acute coronary syndrome  -As discussed above, upon admission cardiac enzymes were cycled and came back negative -She was Placed on aspirin and when necessary nitroglycerin  -Cardiology was consulted and saw patient and she recommended a stress test -The Stress test done 6/13-came back negative for ischemia, but chest pain persisting >> radiology followed up and ordered a CT scan of chest which showed partial healing of the sternum but no evidence of abscess formation -A d-dimer was done and was came back within normal limits -Suspicion is that she likely had chest wall pain and so she was started on anti-inflammatories and cardiology recommended avoiding narcotics. -Patient has still been reporting chest pain- manageable on the anti-inflammatories, she is to follow up outpatient with PCP, and   pain clinic referral to be considered outpatient as needed Active Problems:  CAD- CABG x 09 Feb 2013 (ND)  -Continue aspirin and Lipitor  Bipolar affective disorder- last admission 2010  -she was maintained on her outpatient medications which she is to continue upon discharge. Dyslipidemia  -Continue statin  Hypothyroid  -Continue Synthroid Constipation -She was treated with Senokot, when necessary Dulcolax and received enema today with good results. Consultants:  Cardiology Procedures:  Nuclear medicine stress test-negative for ischemia   Discharge Exam: Filed Vitals:   07/22/13 1151  BP: 111/67  Pulse:   Temp: 97.6 F (36.4 C)  Resp:    Exam:  General: alert & oriented x 3 In  NAD  Cardiovascular: RRR, nl S1 s2  Respiratory: CTAB  Abdomen: soft +BS NT/ND, no masses palpable  Extremities: No cyanosis and no edema    Discharge Instructions You were cared for by a hospitalist during your hospital stay. If you have any questions about your discharge medications or the care you received while you were in the hospital  after you are discharged, you can call the unit and asked to speak with the hospitalist on call if the hospitalist that took care of you is not available. Once you are discharged, your primary care physician will handle any further medical issues. Please note that NO REFILLS for any discharge medications will be authorized once you are discharged, as it is imperative that you return to your primary care physician (or establish a relationship with a primary care physician if you do not have one) for your aftercare needs so that they can reassess your need for medications and monitor your lab values.  Discharge Instructions   Diet - low sodium heart healthy    Complete by:  As directed      Increase activity slowly    Complete by:  As directed             Medication List         albuterol 108 (90 BASE) MCG/ACT inhaler  Commonly known as:  PROVENTIL HFA;VENTOLIN HFA  Inhale 2 puffs into the lungs every 6 (six) hours as needed for wheezing.     aspirin EC 325 MG tablet  Take 325 mg by mouth daily.     atorvastatin 20 MG tablet  Commonly known as:  LIPITOR  Take 20 mg by mouth at bedtime.     buPROPion 150 MG 24 hr tablet  Commonly known as:  WELLBUTRIN XL  Take 150 mg by mouth every evening.     buPROPion 300 MG 24 hr tablet  Commonly known as:  WELLBUTRIN XL  Take 300 mg by mouth every morning.     Calcium Carbonate-Vitamin D 600-400 MG-UNIT per chew tablet  Chew 1 tablet by mouth daily.     clotrimazole 1 % cream  Commonly known as:  LOTRIMIN  Apply 1 application topically 2 (two) times daily.     furosemide 20 MG tablet  Commonly known as:  LASIX  Take 20 mg by mouth.     gabapentin 300 MG capsule  Commonly known as:  NEURONTIN  Take 600 mg by mouth 3 (three) times daily.     ibuprofen 200 MG tablet  Commonly known as:  ADVIL,MOTRIN  Take 1 tablet (200 mg total) by mouth 4 (four) times daily as needed.     levothyroxine 88 MCG tablet  Commonly known as:  SYNTHROID,  LEVOTHROID  Take 88 mcg by mouth daily.     methocarbamol 750 MG tablet  Commonly known as:  ROBAXIN  Take 1,500 mg by mouth 3 (three) times daily.     omeprazole 40 MG capsule  Commonly known as:  PRILOSEC  Take 1 capsule (40 mg total) by mouth 2 (two) times daily.     oxyCODONE-acetaminophen 7.5-325 MG per tablet  Commonly known as:  PERCOCET  Take 0.5 tablets by mouth daily as needed for pain.     propranolol 20 MG tablet  Commonly known as:  INDERAL  Take 20 mg by mouth 2 (two) times daily as needed (anxiety).     QUEtiapine 400 MG tablet  Commonly known as:  SEROQUEL  Take 600 mg  by mouth at bedtime.     QUEtiapine 50 MG tablet  Commonly known as:  SEROQUEL  Take 50 mg by mouth 2 (two) times daily.     senna 8.6 MG Tabs tablet  Commonly known as:  SENOKOT  Take 2 tablets (17.2 mg total) by mouth daily.       Allergies  Allergen Reactions  . Latex   . Penicillins       The results of significant diagnostics from this hospitalization (including imaging, microbiology, ancillary and laboratory) are listed below for reference.    Significant Diagnostic Studies: Dg Chest 2 View  07/19/2013   CLINICAL DATA:  Left-sided chest pain.  Left arm numbness.  EXAM: CHEST  2 VIEW  COMPARISON:  Chest x-ray 02/20/2005.  FINDINGS: Lung volumes are normal. No consolidative airspace disease. No pleural effusions. Pulmonary vasculature is within normal limits. Heart size appears mildly enlarged. Upper mediastinal contours are within normal limits. Abandoned epicardial pacing wires are noted. Status post median sternotomy. Mild scarring in the right middle lobe.  IMPRESSION: 1. No radiographic evidence of acute cardiopulmonary disease. 2. Mild cardiomegaly.   Electronically Signed   By: Vinnie Langton M.D.   On: 07/19/2013 11:54   Ct Chest W Contrast  07/20/2013   CLINICAL DATA:  Chest tenderness. Evaluate for sternal wound infection.  EXAM: CT CHEST WITH CONTRAST  TECHNIQUE:  Multidetector CT imaging of the chest was performed during intravenous contrast administration.  CONTRAST:  36mL OMNIPAQUE IOHEXOL 300 MG/ML  SOLN  COMPARISON:  07/19/2013 and CT from 6/4/5  FINDINGS: No pleural effusion identified. Scar is identified within the anterior left upper lobe. Left upper lobe nodule measures 4 mm, image 18/series 3.  The patient is status post median sternotomy and CABG procedure. There appears to be at least partial bony bridging of the sternotomy wound. No evidence for dehiscence. No peristernal fluid collections identified. No gas or fluid collections identified within the anterior mediastinum. The heart size is normal. No pericardial effusion. Right hilar node measures 1.4 cm, image 23/series 2. No mediastinal or left hilar adenopathy.  There is no enlarged axillary or supraclavicular adenopathy. Incidental imaging through the upper abdomen is on unremarkable. Review of the visualized bony structures is significant for mild spondylosis within the thoracic spine.  IMPRESSION: 1. There has been at least partial fusion of the sternotomy defect compatible with healing. No peristernal fluid collections or a gas identified to suggest abscess. 2. Borderline enlarged right hilar lymph node is of on certain clinical significance. 3. Left upper lobe pulmonary nodule measures 4 mm. Unchanged from 2005 and most likely benign.   Electronically Signed   By: Kerby Moors M.D.   On: 07/20/2013 15:54   Nm Myocar Multi W/spect W/wall Motion / Ef  07/20/2013   CLINICAL DATA:  Chest pain  EXAM: MYOCARDIAL IMAGING WITH SPECT (REST AND PHARMACOLOGIC-STRESS)  GATED LEFT VENTRICULAR WALL MOTION STUDY  LEFT VENTRICULAR EJECTION FRACTION  TECHNIQUE: Standard myocardial SPECT imaging was performed after resting intravenous injection of 10 mCi Tc-86m sestamibi. Subsequently, intravenous infusion of Lexiscan was performed under the supervision of the Cardiology staff. At peak effect of the drug, 30 mCi  Tc-49m sestamibi was injected intravenously and standard myocardial SPECT imaging was performed. Quantitative gated imaging was also performed to evaluate left ventricular wall motion, and estimate left ventricular ejection fraction.  COMPARISON:  None.  FINDINGS: SPECT imaging demonstrates no reversible or irreversible defects to suggest ischemia or infarct.  Quantitative gated analysis shows normal wall  motion.  The resting left ventricular ejection fraction is 13% with end-diastolic volume of 51 ml and end-systolic volume of 21 ml.  IMPRESSION: No evidence of ischemia or infarction.  Ejection fraction 60%.   Electronically Signed   By: Rolm Baptise M.D.   On: 07/20/2013 13:10    Microbiology: Recent Results (from the past 240 hour(s))  MRSA PCR SCREENING     Status: None   Collection Time    07/19/13  6:22 PM      Result Value Ref Range Status   MRSA by PCR NEGATIVE  NEGATIVE Final   Comment:            The GeneXpert MRSA Assay (FDA     approved for NASAL specimens     only), is one component of a     comprehensive MRSA colonization     surveillance program. It is not     intended to diagnose MRSA     infection nor to guide or     monitor treatment for     MRSA infections.     Labs: Basic Metabolic Panel:  Recent Labs Lab 07/19/13 1109 07/20/13 0145 07/21/13 0436  NA 143 141 141  K 4.2 4.1 4.1  CL 105 103 101  CO2 25 26 22   GLUCOSE 107* 114* 121*  BUN 13 15 15   CREATININE 0.88 0.83 0.95  CALCIUM 9.9 9.2 9.5   Liver Function Tests: No results found for this basename: AST, ALT, ALKPHOS, BILITOT, PROT, ALBUMIN,  in the last 168 hours No results found for this basename: LIPASE, AMYLASE,  in the last 168 hours No results found for this basename: AMMONIA,  in the last 168 hours CBC:  Recent Labs Lab 07/19/13 1109  WBC 8.1  HGB 14.0  HCT 43.2  MCV 85.4  PLT 336   Cardiac Enzymes:  Recent Labs Lab 07/19/13 1435 07/19/13 2023 07/20/13 0145  TROPONINI <0.30 <0.30  <0.30   BNP: BNP (last 3 results) No results found for this basename: PROBNP,  in the last 8760 hours CBG:  Recent Labs Lab 07/21/13 1245 07/21/13 1247  GLUCAP 121* 106*       Signed:  Daissy Yerian C  [facility] 07/22/2013, 3:17 PM

## 2013-07-22 NOTE — Progress Notes (Signed)
CSW provided additional community resources to pt. RNCM provided information for community clinic and medication-discount card. CSW signing off.   Ky Barban, MSW, [facility] Clinical Social Worker [phone]

## 2013-07-25 ENCOUNTER — Telehealth: Payer: Self-pay | Admitting: Emergency Medicine

## 2013-07-25 NOTE — Telephone Encounter (Signed)
Returned pt phone call to schedule new pt HFU appt. Pt states she needs medication refilled on heart meds. States she is in the process of getting DSI . Pt has recently moved to [place] from [place] where she had Triple Bypass Jan.2015. Scheduled pt for new pt appt with Dr. Annitta Needs 08/23/13 . Pt given nurse appt next week for med review- Candie Chroman, RN Supervisor

## 2013-07-30 ENCOUNTER — Telehealth: Payer: Self-pay | Admitting: Emergency Medicine

## 2013-07-30 ENCOUNTER — Ambulatory Visit: Payer: Self-pay | Attending: Internal Medicine

## 2013-07-30 ENCOUNTER — Other Ambulatory Visit: Payer: Self-pay | Admitting: Emergency Medicine

## 2013-07-30 DIAGNOSIS — R7989 Other specified abnormal findings of blood chemistry: Secondary | ICD-10-CM

## 2013-07-30 MED ORDER — ALBUTEROL SULFATE HFA 108 (90 BASE) MCG/ACT IN AERS: 2.0000 | INHALATION_SPRAY | Freq: Four times a day (QID) | RESPIRATORY_TRACT | Status: DC | PRN

## 2013-07-30 MED ORDER — LEVOTHYROXINE SODIUM 88 MCG PO TABS: 88.0000 ug | ORAL_TABLET | Freq: Every day | ORAL | Status: DC

## 2013-07-30 MED ORDER — ATORVASTATIN CALCIUM 20 MG PO TABS: 20.0000 mg | ORAL_TABLET | Freq: Every day | ORAL | Status: DC

## 2013-07-30 MED ORDER — NITROGLYCERIN 0.4 MG SL SUBL: 0.4000 mg | SUBLINGUAL_TABLET | SUBLINGUAL | Status: DC | PRN

## 2013-07-30 MED ORDER — OMEPRAZOLE 40 MG PO CPDR: 40.0000 mg | DELAYED_RELEASE_CAPSULE | Freq: Two times a day (BID) | ORAL | Status: DC

## 2013-07-30 MED ORDER — ISOSORBIDE MONONITRATE ER 30 MG PO TB24: 30.0000 mg | ORAL_TABLET | Freq: Every day | ORAL | Status: DC

## 2013-07-30 NOTE — Telephone Encounter (Signed)
Pt comes in today for prescription refills on medications until new pt appt scheduled 7/17/105 with Dr. Annitta Needs. Pt s/p CABG in [place], prescribed cardiac medication, which she ran out in May. Pt denies chest pain or sob. After speaking with pharmacy, medications  Isosorbide 30 mg tab,Levothyroxine 88 mcg,Metotoprolol 25 mg, Atorvastatin 20 mg tab, Pro-Air HFA and Omeprazole 20 mg tab prescribed until appt. Pt also instructed to return 2 dys before doctor appt. Medication reviewed per Dr. Doreene Burke- Lindsay Chroman, RN

## 2013-08-21 ENCOUNTER — Ambulatory Visit: Payer: Self-pay | Attending: Internal Medicine

## 2013-08-21 DIAGNOSIS — R7989 Other specified abnormal findings of blood chemistry: Secondary | ICD-10-CM

## 2013-08-21 LAB — T4, FREE: FREE T4: 1.02 ng/dL (ref 0.80–1.80)

## 2013-08-21 LAB — T3, FREE: T3, Free: 2.3 pg/mL (ref 2.3–4.2)

## 2013-08-22 ENCOUNTER — Telehealth: Payer: Self-pay | Admitting: Emergency Medicine

## 2013-08-22 NOTE — Telephone Encounter (Signed)
Pt sister given normal thyroid level

## 2013-08-22 NOTE — Telephone Encounter (Signed)
Message copied by Ricci Barker on Thu Aug 22, 2013  4:08 PM ------      Message from: Angelica Chessman E      Created: Thu Aug 22, 2013  7:06 AM       Please inform patient that her thyroid function test is normal ------

## 2013-08-23 ENCOUNTER — Encounter: Payer: Self-pay | Admitting: Internal Medicine

## 2013-08-23 ENCOUNTER — Ambulatory Visit: Payer: No Typology Code available for payment source

## 2013-08-23 ENCOUNTER — Ambulatory Visit: Payer: No Typology Code available for payment source | Attending: Internal Medicine | Admitting: Internal Medicine

## 2013-08-23 VITALS — BP 110/75 | HR 73 | Temp 98.5°F | Resp 15 | Wt 181.6 lb

## 2013-08-23 DIAGNOSIS — G8929 Other chronic pain: Secondary | ICD-10-CM | POA: Insufficient documentation

## 2013-08-23 DIAGNOSIS — E089 Diabetes mellitus due to underlying condition without complications: Secondary | ICD-10-CM | POA: Insufficient documentation

## 2013-08-23 DIAGNOSIS — E038 Other specified hypothyroidism: Secondary | ICD-10-CM

## 2013-08-23 DIAGNOSIS — E119 Type 2 diabetes mellitus without complications: Secondary | ICD-10-CM | POA: Insufficient documentation

## 2013-08-23 DIAGNOSIS — Z1211 Encounter for screening for malignant neoplasm of colon: Secondary | ICD-10-CM

## 2013-08-23 DIAGNOSIS — E139 Other specified diabetes mellitus without complications: Secondary | ICD-10-CM

## 2013-08-23 DIAGNOSIS — I25810 Atherosclerosis of coronary artery bypass graft(s) without angina pectoris: Secondary | ICD-10-CM | POA: Insufficient documentation

## 2013-08-23 DIAGNOSIS — K029 Dental caries, unspecified: Secondary | ICD-10-CM | POA: Insufficient documentation

## 2013-08-23 DIAGNOSIS — M545 Low back pain, unspecified: Secondary | ICD-10-CM

## 2013-08-23 DIAGNOSIS — E785 Hyperlipidemia, unspecified: Secondary | ICD-10-CM | POA: Insufficient documentation

## 2013-08-23 DIAGNOSIS — F319 Bipolar disorder, unspecified: Secondary | ICD-10-CM | POA: Insufficient documentation

## 2013-08-23 DIAGNOSIS — E039 Hypothyroidism, unspecified: Secondary | ICD-10-CM | POA: Insufficient documentation

## 2013-08-23 DIAGNOSIS — K5900 Constipation, unspecified: Secondary | ICD-10-CM | POA: Insufficient documentation

## 2013-08-23 DIAGNOSIS — Z139 Encounter for screening, unspecified: Secondary | ICD-10-CM | POA: Insufficient documentation

## 2013-08-23 MED ORDER — MAGNESIUM HYDROXIDE 400 MG/5ML PO SUSP: 5.0000 mL | Freq: Every day | ORAL | Status: DC | PRN
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
## 2013-08-23 NOTE — Patient Instructions (Addendum)
Diabetes Mellitus and Food It is important for you to manage your blood sugar (glucose) level. Your blood glucose level can be greatly affected by what you eat. Eating healthier foods in the appropriate amounts throughout the day at about the same time each day will help you control your blood glucose level. It can also help slow or prevent worsening of your diabetes mellitus. Healthy eating may even help you improve the level of your blood pressure and reach or maintain a healthy weight.  HOW CAN FOOD AFFECT ME? Carbohydrates Carbohydrates affect your blood glucose level more than any other type of food. Your dietitian will help you determine how many carbohydrates to eat at each meal and teach you how to count carbohydrates. Counting carbohydrates is important to keep your blood glucose at a healthy level, especially if you are using insulin or taking certain medicines for diabetes mellitus. Alcohol Alcohol can cause sudden decreases in blood glucose (hypoglycemia), especially if you use insulin or take certain medicines for diabetes mellitus. Hypoglycemia can be a life-threatening condition. Symptoms of hypoglycemia (sleepiness, dizziness, and disorientation) are similar to symptoms of having too much alcohol.  If your health care provider has given you approval to drink alcohol, do so in moderation and use the following guidelines:  Women should not have more than one drink per day, and men should not have more than two drinks per day. One drink is equal to:  12 oz of beer.  5 oz of wine.  1 oz of hard liquor.  Do not drink on an empty stomach.  Keep yourself hydrated. Have water, diet soda, or unsweetened iced tea.  Regular soda, juice, and other mixers might contain a lot of carbohydrates and should be counted. WHAT FOODS ARE NOT RECOMMENDED? As you make food choices, it is important to remember that all foods are not the same. Some foods have fewer nutrients per serving than other  foods, even though they might have the same number of calories or carbohydrates. It is difficult to get your body what it needs when you eat foods with fewer nutrients. Examples of foods that you should avoid that are high in calories and carbohydrates but low in nutrients include:  Trans fats (most processed foods list trans fats on the Nutrition Facts label).  Regular soda.  Juice.  Candy.  Sweets, such as cake, pie, doughnuts, and cookies.  Fried foods. WHAT FOODS CAN I EAT? Have nutrient-rich foods, which will nourish your body and keep you healthy. The food you should eat also will depend on several factors, including:  The calories you need.  The medicines you take.  Your weight.  Your blood glucose level.  Your blood pressure level.  Your cholesterol level. You also should eat a variety of foods, including:  Protein, such as meat, poultry, fish, tofu, nuts, and seeds (lean animal proteins are best).  Fruits.  Vegetables.  Dairy products, such as milk, cheese, and yogurt (low fat is best).  Breads, grains, pasta, cereal, rice, and beans.  Fats such as olive oil, trans fat-free margarine, canola oil, avocado, and olives. DOES EVERYONE WITH DIABETES MELLITUS HAVE THE SAME MEAL PLAN? Because every person with diabetes mellitus is different, there is not one meal plan that works for everyone. It is very important that you meet with a dietitian who will help you create a meal plan that is just right for you. Document Released: 10/21/2004 Document Revised: 01/29/2013 Document Reviewed: 12/21/2012 [employer] Patient Information 2015 [employer]. This   information is not intended to replace advice given to you by your health care provider. Make sure you discuss any questions you have with your health care provider. Fat and Cholesterol Control Diet Fat and cholesterol levels in your blood and organs are influenced by your diet. High levels of fat and cholesterol may lead to  diseases of the heart, small and large blood vessels, gallbladder, liver, and pancreas. CONTROLLING FAT AND CHOLESTEROL WITH DIET Although exercise and lifestyle factors are important, your diet is key. That is because certain foods are known to raise cholesterol and others to lower it. The goal is to balance foods for their effect on cholesterol and more importantly, to replace saturated and trans fat with other types of fat, such as monounsaturated fat, polyunsaturated fat, and omega-3 fatty acids. On average, a person should consume no more than 15 to 17 g of saturated fat daily. Saturated and trans fats are considered "bad" fats, and they will raise LDL cholesterol. Saturated fats are primarily found in animal products such as meats, butter, and cream. However, that does not mean you need to give up all your favorite foods. Today, there are good tasting, low-fat, low-cholesterol substitutes for most of the things you like to eat. Choose low-fat or nonfat alternatives. Choose round or loin cuts of red meat. These types of cuts are lowest in fat and cholesterol. Chicken (without the skin), fish, veal, and ground turkey breast are great choices. Eliminate fatty meats, such as hot dogs and salami. Even shellfish have little or no saturated fat. Have a 3 oz (85 g) portion when you eat lean meat, poultry, or fish. Trans fats are also called "partially hydrogenated oils." They are oils that have been scientifically manipulated so that they are solid at room temperature resulting in a longer shelf life and improved taste and texture of foods in which they are added. Trans fats are found in stick margarine, some tub margarines, cookies, crackers, and baked goods.  When baking and cooking, oils are a great substitute for butter. The monounsaturated oils are especially beneficial since it is believed they lower LDL and raise HDL. The oils you should avoid entirely are saturated tropical oils, such as coconut and palm.   Remember to eat a lot from food groups that are naturally free of saturated and trans fat, including fish, fruit, vegetables, beans, grains (barley, rice, couscous, bulgur wheat), and pasta (without cream sauces).  IDENTIFYING FOODS THAT LOWER FAT AND CHOLESTEROL  Soluble fiber may lower your cholesterol. This type of fiber is found in fruits such as apples, vegetables such as broccoli, potatoes, and carrots, legumes such as beans, peas, and lentils, and grains such as barley. Foods fortified with plant sterols (phytosterol) may also lower cholesterol. You should eat at least 2 g per day of these foods for a cholesterol lowering effect.  Read package labels to identify low-saturated fats, trans fat free, and low-fat foods at the supermarket. Select cheeses that have only 2 to 3 g saturated fat per ounce. Use a heart-healthy tub margarine that is free of trans fats or partially hydrogenated oil. When buying baked goods (cookies, crackers), avoid partially hydrogenated oils. Breads and muffins should be made from whole grains (whole-wheat or whole oat flour, instead of "flour" or "enriched flour"). Buy non-creamy canned soups with reduced salt and no added fats.  FOOD PREPARATION TECHNIQUES  Never deep-fry. If you must fry, either stir-fry, which uses very little fat, or use non-stick cooking sprays. When possible, broil,   bake, or roast meats, and steam vegetables. Instead of putting butter or margarine on vegetables, use lemon and herbs, applesauce, and cinnamon (for squash and sweet potatoes). Use nonfat yogurt, salsa, and low-fat dressings for salads.  LOW-SATURATED FAT / LOW-FAT FOOD SUBSTITUTES Meats / Saturated Fat (g)  Avoid: Steak, marbled (3 oz/85 g) / 11 g  Choose: Steak, lean (3 oz/85 g) / 4 g  Avoid: Hamburger (3 oz/85 g) / 7 g  Choose: Hamburger, lean (3 oz/85 g) / 5 g  Avoid: Ham (3 oz/85 g) / 6 g  Choose: Ham, lean cut (3 oz/85 g) / 2.4 g  Avoid: Chicken, with skin, dark meat (3  oz/85 g) / 4 g  Choose: Chicken, skin removed, dark meat (3 oz/85 g) / 2 g  Avoid: Chicken, with skin, light meat (3 oz/85 g) / 2.5 g  Choose: Chicken, skin removed, light meat (3 oz/85 g) / 1 g Dairy / Saturated Fat (g)  Avoid: Whole milk (1 cup) / 5 g  Choose: Low-fat milk, 2% (1 cup) / 3 g  Choose: Low-fat milk, 1% (1 cup) / 1.5 g  Choose: Skim milk (1 cup) / 0.3 g  Avoid: Hard cheese (1 oz/28 g) / 6 g  Choose: Skim milk cheese (1 oz/28 g) / 2 to 3 g  Avoid: Cottage cheese, 4% fat (1 cup) / 6.5 g  Choose: Low-fat cottage cheese, 1% fat (1 cup) / 1.5 g  Avoid: Ice cream (1 cup) / 9 g  Choose: Sherbet (1 cup) / 2.5 g  Choose: Nonfat frozen yogurt (1 cup) / 0.3 g  Choose: Frozen fruit bar / trace  Avoid: Whipped cream (1 tbs) / 3.5 g  Choose: Nondairy whipped topping (1 tbs) / 1 g Condiments / Saturated Fat (g)  Avoid: Mayonnaise (1 tbs) / 2 g  Choose: Low-fat mayonnaise (1 tbs) / 1 g  Avoid: Butter (1 tbs) / 7 g  Choose: Extra light margarine (1 tbs) / 1 g  Avoid: Coconut oil (1 tbs) / 11.8 g  Choose: Olive oil (1 tbs) / 1.8 g  Choose: Corn oil (1 tbs) / 1.7 g  Choose: Safflower oil (1 tbs) / 1.2 g  Choose: Sunflower oil (1 tbs) / 1.4 g  Choose: Soybean oil (1 tbs) / 2.4 g  Choose: Canola oil (1 tbs) / 1 g Document Released: 01/24/2005 Document Revised: 05/21/2012 Document Reviewed: 07/15/2010 Exit[employer] Patient Information 2015 [place], [place]. This information is not intended to replace advice given to you by your health care provider. Make sure you discuss any questions you have with your health care provider.

## 2013-08-23 NOTE — Progress Notes (Signed)
Patient here to establish care Had by pass surgery this past January Requesting referral to pain management and psychcology

## 2013-08-26 ENCOUNTER — Other Ambulatory Visit: Payer: Self-pay

## 2013-08-26 MED ORDER — ALBUTEROL SULFATE HFA 108 (90 BASE) MCG/ACT IN AERS: 2.0000 | INHALATION_SPRAY | Freq: Four times a day (QID) | RESPIRATORY_TRACT | Status: DC | PRN

## 2013-08-28 ENCOUNTER — Other Ambulatory Visit: Payer: Self-pay | Admitting: Internal Medicine

## 2013-08-28 ENCOUNTER — Encounter: Payer: Self-pay | Admitting: Cardiology

## 2013-08-28 ENCOUNTER — Other Ambulatory Visit: Payer: Self-pay | Admitting: *Deleted

## 2013-08-28 ENCOUNTER — Ambulatory Visit: Payer: No Typology Code available for payment source | Attending: Cardiology | Admitting: Cardiology

## 2013-08-28 VITALS — BP 107/77 | HR 77 | Temp 98.0°F | Resp 20 | Ht 62.0 in | Wt 182.0 lb

## 2013-08-28 DIAGNOSIS — R079 Chest pain, unspecified: Secondary | ICD-10-CM

## 2013-08-28 DIAGNOSIS — Z7982 Long term (current) use of aspirin: Secondary | ICD-10-CM | POA: Insufficient documentation

## 2013-08-28 DIAGNOSIS — Z87891 Personal history of nicotine dependence: Secondary | ICD-10-CM | POA: Insufficient documentation

## 2013-08-28 DIAGNOSIS — E089 Diabetes mellitus due to underlying condition without complications: Secondary | ICD-10-CM

## 2013-08-28 DIAGNOSIS — Z951 Presence of aortocoronary bypass graft: Secondary | ICD-10-CM | POA: Insufficient documentation

## 2013-08-28 DIAGNOSIS — F319 Bipolar disorder, unspecified: Secondary | ICD-10-CM | POA: Insufficient documentation

## 2013-08-28 DIAGNOSIS — E669 Obesity, unspecified: Secondary | ICD-10-CM

## 2013-08-28 DIAGNOSIS — I2510 Atherosclerotic heart disease of native coronary artery without angina pectoris: Secondary | ICD-10-CM

## 2013-08-28 DIAGNOSIS — E785 Hyperlipidemia, unspecified: Secondary | ICD-10-CM

## 2013-08-28 DIAGNOSIS — J45909 Unspecified asthma, uncomplicated: Secondary | ICD-10-CM | POA: Insufficient documentation

## 2013-08-28 DIAGNOSIS — E139 Other specified diabetes mellitus without complications: Secondary | ICD-10-CM

## 2013-08-28 DIAGNOSIS — Z9181 History of falling: Secondary | ICD-10-CM | POA: Insufficient documentation

## 2013-08-28 DIAGNOSIS — Z79899 Other long term (current) drug therapy: Secondary | ICD-10-CM | POA: Insufficient documentation

## 2013-08-28 MED ORDER — NITROGLYCERIN 0.4 MG SL SUBL: 0.4000 mg | SUBLINGUAL_TABLET | SUBLINGUAL | Status: DC | PRN

## 2013-08-28 MED ORDER — ALBUTEROL SULFATE HFA 108 (90 BASE) MCG/ACT IN AERS: 2.0000 | INHALATION_SPRAY | Freq: Four times a day (QID) | RESPIRATORY_TRACT | Status: DC | PRN

## 2013-08-28 NOTE — Progress Notes (Signed)
HPI Lindsay Rivera is a 53 year old African American female who comes today to establish with me as her cardiologist and post hospitalization visit for chest pain.  Her history is remarkable for coronary bypass grafting in [place] in January of this year. We do not have any records. She was admitted to the hospital here with chest discomfort. Cardiac markers were negative though she had hours of pain. When assessed by Dr. Mare Ferrari, she was writhing in pain and was tender to touch over the chest Kalai Baca. EKG showed T wave inversion in 1 aVL and she was arranged to have a stress nuclear study. This showed ejection fraction 60% with no ischemia or infarction.  She's having pain today. It is well localized mid sternum. She has multiple other complaints and has a long history of chronic back pain requiring her to use a walker. She would like a handicap sticker because of 2 falls in the past year. She has a significant health history with a bipolar disorder.  Risk factors for coronary disease include diabetes, hypertension, obesity, hyperlipidemia. She's a former smoker.  Past Medical History  Diagnosis Date  . Depression   . Bipolar disorder   . History of hysterectomy   . 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 MI (myocardial infarction)   . Diabetes mellitus without complication   . Hypertension   . Asthma     Current Outpatient Prescriptions  Medication Sig Dispense Refill  . albuterol (PROVENTIL HFA;VENTOLIN HFA) 108 (90 BASE) MCG/ACT inhaler Inhale 2 puffs into the lungs every 6 (six) hours as needed for wheezing.  3 Inhaler  3  . aspirin EC 325 MG tablet Take 325 mg by mouth daily.      Marland Kitchen atorvastatin (LIPITOR) 20 MG tablet Take 1 tablet (20 mg total) by mouth at bedtime.  30 tablet  1  . buPROPion (WELLBUTRIN XL) 150 MG 24 hr tablet Take 150 mg by mouth every evening.      Marland Kitchen buPROPion (WELLBUTRIN XL) 300 MG 24 hr tablet Take 300 mg by mouth every morning.      . Calcium Carbonate-Vitamin D 600-400  MG-UNIT per chew tablet Chew 1 tablet by mouth daily.      . clotrimazole (LOTRIMIN) 1 % cream Apply 1 application topically 2 (two) times daily.      . diazepam (VALIUM) 5 MG tablet Take 5 mg by mouth 2 (two) times daily as needed for anxiety.      . furosemide (LASIX) 20 MG tablet Take 20 mg by mouth.      . levothyroxine (SYNTHROID, LEVOTHROID) 88 MCG tablet Take 1 tablet (88 mcg total) by mouth daily.  30 tablet  1  . methocarbamol (ROBAXIN) 750 MG tablet Take 1,500 mg by mouth 3 (three) times daily.      . nitroGLYCERIN (NITROSTAT) 0.4 MG SL tablet Place 1 tablet (0.4 mg total) under the tongue every 5 (five) minutes as needed for chest pain.  50 tablet  0  . omeprazole (PRILOSEC) 40 MG capsule Take 1 capsule (40 mg total) by mouth 2 (two) times daily.  30 capsule  1  . oxyCODONE-acetaminophen (PERCOCET) 7.5-325 MG per tablet Take 0.5 tablets by mouth daily as needed for pain.      Marland Kitchen propranolol (INDERAL) 20 MG tablet Take 20 mg by mouth 2 (two) times daily as needed (anxiety).      . QUEtiapine (SEROQUEL) 400 MG tablet Take 600 mg by mouth at bedtime.      Marland Kitchen  QUEtiapine (SEROQUEL) 50 MG tablet Take 50 mg by mouth 2 (two) times daily.      Marland Kitchen senna (SENOKOT) 8.6 MG TABS tablet Take 2 tablets (17.2 mg total) by mouth daily.  60 each  0  . gabapentin (NEURONTIN) 300 MG capsule Take 600 mg by mouth 3 (three) times daily.      . isosorbide mononitrate (IMDUR) 30 MG 24 hr tablet Take 1 tablet (30 mg total) by mouth daily.  30 tablet  1  . magnesium hydroxide (MILK OF MAGNESIA) 400 MG/5ML suspension Take 5 mLs by mouth daily as needed for mild constipation.  360 mL  0   No current facility-administered medications for this visit.    Allergies  Allergen Reactions  . Latex   . Penicillins     Family History  Problem Relation Age of Onset  . Diabetes Mother   . Hypertension Mother   . Heart disease Father   . Hypertension Father   . Cancer Father     colon cancer     History   Social  History  . Marital Status: Divorced    Spouse Name: N/A    Number of Children: N/A  . Years of Education: N/A   Occupational History  . Not on file.   Social History Main Topics  . Smoking status: Former Research scientist (life sciences)  . Smokeless tobacco: Not on file  . Alcohol Use: No  . Drug Use: No  . Sexual Activity: Not on file   Other Topics Concern  . Not on file   Social History Narrative   Current smoker    ROS ALL NEGATIVE EXCEPT THOSE NOTED IN HPI  PE  General Appearance: well developed, well nourished in no acute distress, obese HEENT: symmetrical face, PERRLA, good dentition  Neck: no JVD, thyromegaly, or adenopathy, trachea midline Chest: symmetric without deformity Cardiac: PMI non-displaced, RRR, normal S1, S2, no gallop or murmur Lung: clear to ausculation and percussion Vascular: all pulses full without bruits  Abdominal: nondistended, nontender, good bowel sounds, Extremities: no cyanosis, clubbing or edema, no sign of DVT, no varicosities  Skin: normal color, no rashes Neuro: alert and oriented x 3, non-focal Pysch: normal affect  EKG Not repeated today. BMET    Component Value Date/Time   NA 141 07/21/2013 0436   K 4.1 07/21/2013 0436   CL 101 07/21/2013 0436   CO2 22 07/21/2013 0436   GLUCOSE 121* 07/21/2013 0436   BUN 15 07/21/2013 0436   CREATININE 0.95 07/21/2013 0436   CALCIUM 9.5 07/21/2013 0436   GFRNONAA 67* 07/21/2013 0436   GFRAA 78* 07/21/2013 0436    Lipid Panel     Component Value Date/Time   CHOL 199 07/20/2013 0145   TRIG 448* 07/20/2013 0145   HDL 32* 07/20/2013 0145   CHOLHDL 6.2 07/20/2013 0145   VLDL UNABLE TO CALCULATE IF TRIGLYCERIDE OVER 400 mg/dL 07/20/2013 0145   LDLCALC UNABLE TO CALCULATE IF TRIGLYCERIDE OVER 400 mg/dL 07/20/2013 0145    CBC    Component Value Date/Time   WBC 8.1 07/19/2013 1109   RBC 5.06 07/19/2013 1109   HGB 14.0 07/19/2013 1109   HCT 43.2 07/19/2013 1109   PLT 336 07/19/2013 1109   MCV 85.4 07/19/2013 1109   MCH 27.7  07/19/2013 1109   MCHC 32.4 07/19/2013 1109   RDW 15.7* 07/19/2013 1109   LYMPHSABS 2.1 01/13/2009 1155   MONOABS 0.4 01/13/2009 1155   EOSABS 0.2 01/13/2009 1155   BASOSABS 0.0 01/13/2009 1155

## 2013-08-28 NOTE — Patient Instructions (Signed)
Your nitroglycerin has been refilled. Please stop taking Lasix.  Please return to see Dr. Verl Blalock in one year.

## 2013-08-28 NOTE — Progress Notes (Signed)
Patient recently hospitalized (07/19/13-07/22/13) for chest pain. Troponins were negative, stress test negative ischemia. Patient indicates currently having chest pain 6/10-tightening pain.  Dr. Verl Blalock notified; EKG not needed per MD. Hx of CAD-CABG x 3 1/15. 2 falls in the last year.  Handicap sticker form

## 2013-08-28 NOTE — Assessment & Plan Note (Signed)
This is most likely musculoskeletal or related to stress anxiety. I reassured her that her heart is stable and review the results of her nuclear study. Continue secondary preventative therapy for recurrent vascular events including coronary events. I've renewed her sublingual nitroglycerin just in case she needs it. I will followup with her in one year.

## 2013-09-30 ENCOUNTER — Telehealth: Payer: Self-pay

## 2013-09-30 ENCOUNTER — Other Ambulatory Visit: Payer: Self-pay | Admitting: Internal Medicine

## 2013-09-30 ENCOUNTER — Other Ambulatory Visit: Payer: Self-pay

## 2013-09-30 DIAGNOSIS — I25110 Atherosclerotic heart disease of native coronary artery with unstable angina pectoris: Secondary | ICD-10-CM

## 2013-09-30 MED ORDER — OMEPRAZOLE 40 MG PO CPDR: 40.0000 mg | DELAYED_RELEASE_CAPSULE | Freq: Two times a day (BID) | ORAL | Status: DC

## 2013-09-30 MED ORDER — LEVOTHYROXINE SODIUM 88 MCG PO TABS: 88.0000 ug | ORAL_TABLET | Freq: Every day | ORAL | Status: DC

## 2013-09-30 MED ORDER — ISOSORBIDE MONONITRATE ER 30 MG PO TB24: 30.0000 mg | ORAL_TABLET | Freq: Every day | ORAL | Status: DC

## 2013-09-30 MED ORDER — ATORVASTATIN CALCIUM 20 MG PO TABS: 20.0000 mg | ORAL_TABLET | Freq: Every day | ORAL | Status: DC

## 2013-09-30 NOTE — Telephone Encounter (Signed)
Patient called and spoke with Tywan the social worked Needed her medications refilled for her HTN and thyroid Prescriptions sent [facility]

## 2013-10-01 ENCOUNTER — Encounter: Payer: Self-pay | Admitting: Internal Medicine

## 2013-10-02 ENCOUNTER — Ambulatory Visit (AMBULATORY_SURGERY_CENTER): Payer: Self-pay | Admitting: *Deleted

## 2013-10-02 ENCOUNTER — Telehealth: Payer: Self-pay | Admitting: *Deleted

## 2013-10-02 VITALS — Ht 62.5 in | Wt 188.4 lb

## 2013-10-02 DIAGNOSIS — Z1211 Encounter for screening for malignant neoplasm of colon: Secondary | ICD-10-CM

## 2013-10-02 MED ORDER — MOVIPREP 100 G PO SOLR: ORAL | Status: DC

## 2013-10-02 NOTE — Telephone Encounter (Signed)
Unable to reach pt.  Will try later

## 2013-10-02 NOTE — Telephone Encounter (Signed)
Dr Hilarie Fredrickson: pt is scheduled for direct screening colonoscopy 10/15/13.  During PV pt says she has severe constipation.  She takes Miralax daily and has 1 BM every 1 to 1 1/2 weeks.  She complains of bloating and abdominal cramping.  Is she okay for direct or do you want to see her in the office?  She was given instructions for 2 day prep with Miralax and MoviPrep just in case you ok her for direct procedure.  Thanks, Juliann Pulse

## 2013-10-02 NOTE — Progress Notes (Signed)
No allergies to eggs or soy. No problems with anesthesia.  Pt not given Emmi instructions for colonoscopy; no computer access  No oxygen use  No diet drug use  

## 2013-10-02 NOTE — Telephone Encounter (Signed)
Talked with pt and informed her to proceed with colonoscopy as scheduled

## 2013-10-02 NOTE — Telephone Encounter (Signed)
[place] for direct procedure, we can discussed constipation based on procedure findings Agree with 2 day prep

## 2013-10-09 ENCOUNTER — Emergency Department (HOSPITAL_COMMUNITY)
Admission: EM | Admit: 2013-10-09 | Discharge: 2013-10-09 | Disposition: A | Discharge status: Home / Self Care | Payer: MEDICAID | Attending: Emergency Medicine | Admitting: Emergency Medicine

## 2013-10-09 ENCOUNTER — Encounter (HOSPITAL_COMMUNITY): Payer: Self-pay | Admitting: Emergency Medicine

## 2013-10-09 ENCOUNTER — Ambulatory Visit: Payer: Self-pay | Attending: Internal Medicine

## 2013-10-09 ENCOUNTER — Telehealth: Payer: Self-pay | Admitting: Internal Medicine

## 2013-10-09 DIAGNOSIS — M545 Low back pain, unspecified: Secondary | ICD-10-CM | POA: Insufficient documentation

## 2013-10-09 DIAGNOSIS — F319 Bipolar disorder, unspecified: Secondary | ICD-10-CM | POA: Insufficient documentation

## 2013-10-09 DIAGNOSIS — M549 Dorsalgia, unspecified: Secondary | ICD-10-CM

## 2013-10-09 DIAGNOSIS — I252 Old myocardial infarction: Secondary | ICD-10-CM | POA: Insufficient documentation

## 2013-10-09 DIAGNOSIS — Z951 Presence of aortocoronary bypass graft: Secondary | ICD-10-CM | POA: Insufficient documentation

## 2013-10-09 DIAGNOSIS — Z79899 Other long term (current) drug therapy: Secondary | ICD-10-CM | POA: Insufficient documentation

## 2013-10-09 DIAGNOSIS — F411 Generalized anxiety disorder: Secondary | ICD-10-CM | POA: Insufficient documentation

## 2013-10-09 DIAGNOSIS — K219 Gastro-esophageal reflux disease without esophagitis: Secondary | ICD-10-CM | POA: Insufficient documentation

## 2013-10-09 DIAGNOSIS — J45909 Unspecified asthma, uncomplicated: Secondary | ICD-10-CM | POA: Insufficient documentation

## 2013-10-09 DIAGNOSIS — G8929 Other chronic pain: Secondary | ICD-10-CM

## 2013-10-09 DIAGNOSIS — E039 Hypothyroidism, unspecified: Secondary | ICD-10-CM | POA: Insufficient documentation

## 2013-10-09 DIAGNOSIS — R209 Unspecified disturbances of skin sensation: Secondary | ICD-10-CM | POA: Insufficient documentation

## 2013-10-09 DIAGNOSIS — Z87891 Personal history of nicotine dependence: Secondary | ICD-10-CM | POA: Insufficient documentation

## 2013-10-09 DIAGNOSIS — E119 Type 2 diabetes mellitus without complications: Secondary | ICD-10-CM | POA: Insufficient documentation

## 2013-10-09 DIAGNOSIS — Z7982 Long term (current) use of aspirin: Secondary | ICD-10-CM | POA: Insufficient documentation

## 2013-10-09 DIAGNOSIS — Z91040 Latex allergy status: Secondary | ICD-10-CM | POA: Insufficient documentation

## 2013-10-09 DIAGNOSIS — M129 Arthropathy, unspecified: Secondary | ICD-10-CM | POA: Insufficient documentation

## 2013-10-09 DIAGNOSIS — I10 Essential (primary) hypertension: Secondary | ICD-10-CM | POA: Insufficient documentation

## 2013-10-09 DIAGNOSIS — Z880 Allergy status to penicillin: Secondary | ICD-10-CM | POA: Insufficient documentation

## 2013-10-09 DIAGNOSIS — E785 Hyperlipidemia, unspecified: Secondary | ICD-10-CM | POA: Insufficient documentation

## 2013-10-09 MED ORDER — OXYCODONE-ACETAMINOPHEN 7.5-325 MG PO TABS
1.0000 | ORAL_TABLET | ORAL | Status: DC | PRN
Start: 2013-10-09 — End: 2014-01-17

## 2013-10-09 NOTE — ED Notes (Signed)
Pt presents to department for evaluation of back pain. Pt states she recently moved here, waiting to see pain management physician, requesting pain medications. 8/10 pain at the time. Ambulatory to triage. NAD.

## 2013-10-09 NOTE — Discharge Instructions (Signed)
Continue pain medications. Follow up with either primary care doctor or pain management as soon as able.    Chronic Back Pain  When back pain lasts longer than 3 months, it is called chronic back pain.People with chronic back pain often go through certain periods that are more intense (flare-ups).  CAUSES Chronic back pain can be caused by wear and tear (degeneration) on different structures in your back. These structures include:  The bones of your spine (vertebrae) and the joints surrounding your spinal cord and nerve roots (facets).  The strong, fibrous tissues that connect your vertebrae (ligaments). Degeneration of these structures may result in pressure on your nerves. This can lead to constant pain. HOME CARE INSTRUCTIONS  Avoid bending, heavy lifting, prolonged sitting, and activities which make the problem worse.  Take brief periods of rest throughout the day to reduce your pain. Lying down or standing usually is better than sitting while you are resting.  Take over-the-counter or prescription medicines only as directed by your caregiver. SEEK IMMEDIATE MEDICAL CARE IF:   You have weakness or numbness in one of your legs or feet.  You have trouble controlling your bladder or bowels.  You have nausea, vomiting, abdominal pain, shortness of breath, or fainting. Document Released: 03/03/2004 Document Revised: 04/18/2011 Document Reviewed: 01/08/2011 [facility] Patient Information 2015 [place], [place]. This information is not intended to replace advice given to you by your health care provider. Make sure you discuss any questions you have with your health care provider.

## 2013-10-09 NOTE — ED Notes (Signed)
Declined W/C at D/C and was escorted to lobby by RN. 
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
## 2013-10-09 NOTE — ED Provider Notes (Signed)
Medical screening examination/treatment/procedure(s) were performed by non-physician practitioner and as supervising physician I was immediately available for consultation/collaboration.  Ernestina Patches, MD 10/09/13 [phone]

## 2013-10-10 ENCOUNTER — Other Ambulatory Visit: Payer: Self-pay

## 2013-10-11 ENCOUNTER — Other Ambulatory Visit: Payer: Self-pay

## 2013-10-15 ENCOUNTER — Ambulatory Visit (AMBULATORY_SURGERY_CENTER): Payer: Self-pay | Admitting: Internal Medicine

## 2013-10-15 ENCOUNTER — Encounter: Payer: Self-pay | Admitting: Internal Medicine

## 2013-10-15 VITALS — BP 147/95 | HR 66 | Temp 97.4°F | Resp 17 | Ht 62.5 in | Wt 188.0 lb

## 2013-10-15 DIAGNOSIS — Z1211 Encounter for screening for malignant neoplasm of colon: Secondary | ICD-10-CM

## 2013-10-15 DIAGNOSIS — D126 Benign neoplasm of colon, unspecified: Secondary | ICD-10-CM

## 2013-10-15 MED ORDER — SODIUM CHLORIDE 0.9 % IV SOLN: 500.0000 mL | INTRAVENOUS | Status: DC

## 2013-10-15 NOTE — Op Note (Signed)
[facility]  [address] [place], [id number]   COLONOSCOPY PROCEDURE REPORT  PATIENT: Lindsay Rivera, Lindsay Rivera  MR#: [id number] BIRTHDATE: 1960/09/29 , 27  yrs. old GENDER: Female ENDOSCOPIST: Jerene Bears, MD REFERRED GO:[id number] Advani, MD PROCEDURE DATE:  10/15/2013 PROCEDURE:   Colonoscopy with snare polypectomy First Screening Colonoscopy - Avg.  risk and is 50 yrs.  old or older Yes.  Prior Negative Screening - Now for repeat screening. N/A  History of Adenoma - Now for follow-up colonoscopy & has been > or = to 3 yrs.  N/A  Polyps Removed Today? Yes. ASA CLASS:   Class III INDICATIONS:average risk screening and first colonoscopy. MEDICATIONS: MAC sedation, administered by CRNA and propofol (Diprivan) 200mg  IV  DESCRIPTION OF PROCEDURE:   After the risks benefits and alternatives of the procedure were thoroughly explained, informed consent was obtained.  A digital rectal exam revealed no rectal mass.   The LB [id number]  endoscope was introduced through the anus and advanced to the cecum, which was identified by both the appendix and ileocecal valve. No adverse events experienced. The quality of the prep was good, using MoviPrep  The instrument was then slowly withdrawn as the colon was fully examined.   COLON FINDINGS: Two sessile polyps ranging between 3-70mm in size were found in the transverse colon and sigmoid colon.  Polypectomy was performed using cold snare.  All resections were complete and all polyp tissue was completely retrieved.   The colon mucosa was otherwise normal.  Retroflexed views revealed internal hemorrhoids. The time to cecum=3 minutes 13 seconds.  Withdrawal time=11 minutes 58 seconds.  The scope was withdrawn and the procedure completed. COMPLICATIONS: There were no complications.  ENDOSCOPIC IMPRESSION: 1.   Two sessile polyps ranging between 3-76mm in size were found in the transverse colon and sigmoid colon; Polypectomy was  performed using cold snare 2.   The colon mucosa was otherwise normal  RECOMMENDATIONS: 1.  Await biopsy results 2.  If the polyps removed today are proven to be adenomatous (pre-cancerous) polyps, you will need a repeat colonoscopy in 5 years.  Otherwise you should continue to follow colorectal cancer screening guidelines for "routine risk" patients with colonoscopy in 10 years.  You will receive a letter within 1-2 weeks with the results of your biopsy as well as final recommendations.  Please call my office if you have not received a letter after 3 weeks.   eSigned:  Jerene Bears, MD 10/15/2013 11:06 AM  cc: The Patient; Lorayne Marek, MD

## 2013-10-15 NOTE — Patient Instructions (Signed)
Discharge instructions given with verbal understanding. Handout on polyps. Resume previous medications. YOU HAD AN ENDOSCOPIC PROCEDURE TODAY AT THE [facility]: Refer to the procedure report that was given to you for any specific questions about what was found during the examination.  If the procedure report does not answer your questions, please call your gastroenterologist to clarify.  If you requested that your care partner not be given the details of your procedure findings, then the procedure report has been included in a sealed envelope for you to review at your convenience later.  YOU SHOULD EXPECT: Some feelings of bloating in the abdomen. Passage of more gas than usual.  Walking can help get rid of the air that was put into your GI tract during the procedure and reduce the bloating. If you had a lower endoscopy (such as a colonoscopy or flexible sigmoidoscopy) you may notice spotting of blood in your stool or on the toilet paper. If you underwent a bowel prep for your procedure, then you may not have a normal bowel movement for a few days.  DIET: Your first meal following the procedure should be a light meal and then it is ok to progress to your normal diet.  A half-sandwich or bowl of soup is an example of a good first meal.  Heavy or fried foods are harder to digest and may make you feel nauseous or bloated.  Likewise meals heavy in dairy and vegetables can cause extra gas to form and this can also increase the bloating.  Drink plenty of fluids but you should avoid alcoholic beverages for 24 hours.  ACTIVITY: Your care partner should take you home directly after the procedure.  You should plan to take it easy, moving slowly for the rest of the day.  You can resume normal activity the day after the procedure however you should NOT DRIVE or use heavy machinery for 24 hours (because of the sedation medicines used during the test).    SYMPTOMS TO REPORT IMMEDIATELY: A  gastroenterologist can be reached at any hour.  During normal business hours, 8:30 AM to 5:00 PM Monday through Friday, call [phone].  After hours and on weekends, please call the GI answering service at [phone] who will take a message and have the physician on call contact you.   Following lower endoscopy (colonoscopy or flexible sigmoidoscopy):  Excessive amounts of blood in the stool  Significant tenderness or worsening of abdominal pains  Swelling of the abdomen that is new, acute  Fever of 100F or higher  FOLLOW UP: If any biopsies were taken you will be contacted by phone or by letter within the next 1-3 weeks.  Call your gastroenterologist if you have not heard about the biopsies in 3 weeks.  Our staff will call the home number listed on your records the next business day following your procedure to check on you and address any questions or concerns that you may have at that time regarding the information given to you following your procedure. This is a courtesy call and so if there is no answer at the home number and we have not heard from you through the emergency physician on call, we will assume that you have returned to your regular daily activities without incident.  SIGNATURES/CONFIDENTIALITY: You and/or your care partner have signed paperwork which will be entered into your electronic medical record.  These signatures attest to the fact that that the information above on your After Visit Summary has been   reviewed and is understood.  Full responsibility of the confidentiality of this discharge information lies with you and/or your care-partner. 

## 2013-10-15 NOTE — Progress Notes (Signed)
Called to room to assist during endoscopic procedure.  Patient ID and intended procedure confirmed with present staff. Received instructions for my participation in the procedure from the performing physician.  

## 2013-10-15 NOTE — Progress Notes (Signed)
A/ox3 pleased with MAC, report to Celia RN 

## 2013-10-16 ENCOUNTER — Telehealth: Payer: Self-pay | Admitting: *Deleted

## 2013-10-16 NOTE — Telephone Encounter (Signed)
  Follow up Call-  Call back number 10/15/2013  Post procedure Call Back phone  # [phone]  Permission to leave phone message Yes     Patient questions:  Do you have a fever, pain , or abdominal swelling? No. Pain Score  0 *  Have you tolerated food without any problems? Yes.    Have you been able to return to your normal activities? Yes.    Do you have any questions about your discharge instructions: Diet   No. Medications  No. Follow up visit  No.  Do you have questions or concerns about your Care? No.  Actions: * If pain score is 4 or above: No action needed, pain <4.

## 2013-10-21 ENCOUNTER — Encounter: Payer: Self-pay | Admitting: Internal Medicine

## 2013-10-29 ENCOUNTER — Other Ambulatory Visit: Payer: Self-pay

## 2013-10-29 MED ORDER — LEVOTHYROXINE SODIUM 88 MCG PO TABS: 88.0000 ug | ORAL_TABLET | Freq: Every day | ORAL | Status: DC

## 2013-11-07 ENCOUNTER — Ambulatory Visit: Payer: Self-pay | Admitting: Internal Medicine

## 2013-11-12 ENCOUNTER — Ambulatory Visit: Payer: Self-pay | Attending: Internal Medicine

## 2013-11-12 DIAGNOSIS — E038 Other specified hypothyroidism: Secondary | ICD-10-CM

## 2013-11-12 DIAGNOSIS — Z139 Encounter for screening, unspecified: Secondary | ICD-10-CM

## 2013-11-12 DIAGNOSIS — I25810 Atherosclerosis of coronary artery bypass graft(s) without angina pectoris: Secondary | ICD-10-CM

## 2013-11-12 LAB — CBC WITH DIFFERENTIAL/PLATELET
BASOS PCT: 0 % (ref 0–1)
Basophils Absolute: 0 10*3/uL (ref 0.0–0.1)
Eosinophils Absolute: 0.3 10*3/uL (ref 0.0–0.7)
Eosinophils Relative: 3 % (ref 0–5)
HCT: 38.3 % (ref 36.0–46.0)
Hemoglobin: 12.7 g/dL (ref 12.0–15.0)
LYMPHS ABS: 1.9 10*3/uL (ref 0.7–4.0)
Lymphocytes Relative: 18 % (ref 12–46)
MCH: 29.4 pg (ref 26.0–34.0)
MCHC: 33.2 g/dL (ref 30.0–36.0)
MCV: 88.7 fL (ref 78.0–100.0)
Monocytes Absolute: 0.6 10*3/uL (ref 0.1–1.0)
Monocytes Relative: 6 % (ref 3–12)
NEUTROS PCT: 73 % (ref 43–77)
Neutro Abs: 7.7 10*3/uL (ref 1.7–7.7)
Platelets: 451 10*3/uL — ABNORMAL HIGH (ref 150–400)
RBC: 4.32 MIL/uL (ref 3.87–5.11)
RDW: 15.7 % — ABNORMAL HIGH (ref 11.5–15.5)
WBC: 10.6 10*3/uL — ABNORMAL HIGH (ref 4.0–10.5)

## 2013-11-12 LAB — LIPID PANEL
CHOL/HDL RATIO: 4.5 ratio
Cholesterol: 196 mg/dL (ref 0–200)
HDL: 44 mg/dL (ref >39)
LDL CALC: 116 mg/dL — AB (ref 0–99)
Triglycerides: 182 mg/dL — ABNORMAL HIGH (ref <150)
VLDL: 36 mg/dL (ref 0–40)

## 2013-11-12 LAB — COMPLETE METABOLIC PANEL WITH GFR
ALK PHOS: 123 U/L — AB (ref 39–117)
ALT: 14 U/L (ref 0–35)
AST: 15 U/L (ref 0–37)
Albumin: 4.1 g/dL (ref 3.5–5.2)
BILIRUBIN TOTAL: 0.4 mg/dL (ref 0.2–1.2)
BUN: 13 mg/dL (ref 6–23)
CO2: 27 mEq/L (ref 19–32)
Calcium: 9.4 mg/dL (ref 8.4–10.5)
Chloride: 102 mEq/L (ref 96–112)
Creat: 0.98 mg/dL (ref 0.50–1.10)
GFR, Est African American: 76 mL/min
GFR, Est Non African American: 66 mL/min
Glucose, Bld: 108 mg/dL — ABNORMAL HIGH (ref 70–99)
Potassium: 4.1 mEq/L (ref 3.5–5.3)
Sodium: 140 mEq/L (ref 135–145)
TOTAL PROTEIN: 7.4 g/dL (ref 6.0–8.3)

## 2013-11-12 LAB — TSH: TSH: 5.896 u[IU]/mL — AB (ref 0.350–4.500)

## 2013-11-13 ENCOUNTER — Encounter: Payer: Self-pay | Admitting: Internal Medicine

## 2013-11-13 ENCOUNTER — Ambulatory Visit: Payer: Self-pay | Attending: Internal Medicine | Admitting: Internal Medicine

## 2013-11-13 VITALS — BP 120/83 | HR 76 | Temp 98.0°F | Resp 16 | Wt 186.0 lb

## 2013-11-13 DIAGNOSIS — E785 Hyperlipidemia, unspecified: Secondary | ICD-10-CM

## 2013-11-13 DIAGNOSIS — G8929 Other chronic pain: Secondary | ICD-10-CM

## 2013-11-13 DIAGNOSIS — E038 Other specified hypothyroidism: Secondary | ICD-10-CM

## 2013-11-13 DIAGNOSIS — L739 Follicular disorder, unspecified: Secondary | ICD-10-CM

## 2013-11-13 DIAGNOSIS — M545 Low back pain: Secondary | ICD-10-CM

## 2013-11-13 LAB — VITAMIN D 25 HYDROXY (VIT D DEFICIENCY, FRACTURES): VIT D 25 HYDROXY: 52 ng/mL (ref 30–89)

## 2013-11-13 MED ORDER — SULFAMETHOXAZOLE-TMP DS 800-160 MG PO TABS: 1.0000 | ORAL_TABLET | Freq: Two times a day (BID) | ORAL | Status: DC

## 2013-11-13 MED ORDER — LEVOTHYROXINE SODIUM 100 MCG PO TABS: 100.0000 ug | ORAL_TABLET | Freq: Every day | ORAL | Status: DC

## 2013-11-13 NOTE — Progress Notes (Signed)
Patient here for follow up on her thyroid States has not been feeling well Has a rash on her legs Complains of back pain
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
## 2013-11-14 ENCOUNTER — Telehealth: Payer: Self-pay

## 2013-11-14 NOTE — Telephone Encounter (Signed)
Called to patient to let her know her scheduled MRI is 11/26/2013 At 12pm at [facility] Patient was not available Left message on voice mail with the details of her test

## 2013-11-19 ENCOUNTER — Encounter: Payer: Self-pay | Admitting: Physical Medicine & Rehabilitation

## 2013-11-20 ENCOUNTER — Ambulatory Visit: Payer: Self-pay | Attending: Internal Medicine

## 2013-11-26 ENCOUNTER — Ambulatory Visit (HOSPITAL_COMMUNITY): Payer: MEDICAID

## 2013-11-28 ENCOUNTER — Ambulatory Visit (HOSPITAL_COMMUNITY)
Admission: RE | Admit: 2013-11-28 | Discharge: 2013-11-28 | Disposition: A | Discharge status: Home / Self Care | Payer: Self-pay | Source: Ambulatory Visit | Attending: Internal Medicine | Admitting: Internal Medicine

## 2013-11-28 DIAGNOSIS — M545 Low back pain, unspecified: Secondary | ICD-10-CM

## 2013-11-28 DIAGNOSIS — G8929 Other chronic pain: Secondary | ICD-10-CM | POA: Insufficient documentation

## 2013-11-28 DIAGNOSIS — M8938 Hypertrophy of bone, other site: Secondary | ICD-10-CM | POA: Insufficient documentation

## 2013-11-28 DIAGNOSIS — M5136 Other intervertebral disc degeneration, lumbar region: Secondary | ICD-10-CM | POA: Insufficient documentation

## 2013-11-28 DIAGNOSIS — M4806 Spinal stenosis, lumbar region: Secondary | ICD-10-CM | POA: Insufficient documentation

## 2013-12-02 ENCOUNTER — Telehealth: Payer: Self-pay | Admitting: Internal Medicine

## 2013-12-02 DIAGNOSIS — I25110 Atherosclerotic heart disease of native coronary artery with unstable angina pectoris: Secondary | ICD-10-CM

## 2013-12-02 NOTE — Telephone Encounter (Signed)
Patient calling to request a medication refill for all of her medications. Please assist.

## 2013-12-03 MED ORDER — ISOSORBIDE MONONITRATE ER 30 MG PO TB24: 30.0000 mg | ORAL_TABLET | Freq: Every day | ORAL | Status: DC

## 2013-12-03 MED ORDER — OMEPRAZOLE 40 MG PO CPDR: 40.0000 mg | DELAYED_RELEASE_CAPSULE | Freq: Two times a day (BID) | ORAL | Status: DC

## 2013-12-03 MED ORDER — NITROGLYCERIN 0.4 MG SL SUBL: SUBLINGUAL_TABLET | SUBLINGUAL | Status: DC

## 2013-12-03 MED ORDER — ATORVASTATIN CALCIUM 20 MG PO TABS: 20.0000 mg | ORAL_TABLET | Freq: Every day | ORAL | Status: DC

## 2013-12-03 NOTE — Telephone Encounter (Signed)
Refilled nitro, Lipitor, Prilosec, indur

## 2013-12-10 ENCOUNTER — Encounter: Payer: No Typology Code available for payment source | Attending: Physical Medicine & Rehabilitation

## 2013-12-10 ENCOUNTER — Ambulatory Visit: Payer: Self-pay | Admitting: Physical Medicine & Rehabilitation

## 2013-12-10 DIAGNOSIS — Z5181 Encounter for therapeutic drug level monitoring: Secondary | ICD-10-CM | POA: Insufficient documentation

## 2013-12-10 DIAGNOSIS — M545 Low back pain: Secondary | ICD-10-CM | POA: Insufficient documentation

## 2013-12-10 DIAGNOSIS — Z79899 Other long term (current) drug therapy: Secondary | ICD-10-CM | POA: Insufficient documentation

## 2013-12-10 DIAGNOSIS — G894 Chronic pain syndrome: Secondary | ICD-10-CM | POA: Insufficient documentation

## 2013-12-10 DIAGNOSIS — G8929 Other chronic pain: Secondary | ICD-10-CM | POA: Insufficient documentation

## 2013-12-18 ENCOUNTER — Ambulatory Visit: Payer: Self-pay | Attending: Internal Medicine

## 2013-12-19 ENCOUNTER — Other Ambulatory Visit: Payer: Self-pay | Admitting: Internal Medicine

## 2013-12-24 ENCOUNTER — Telehealth: Payer: Self-pay | Admitting: Emergency Medicine

## 2013-12-26 ENCOUNTER — Other Ambulatory Visit: Payer: Self-pay | Admitting: Physical Medicine & Rehabilitation

## 2013-12-26 ENCOUNTER — Encounter: Payer: Self-pay | Admitting: Physical Medicine & Rehabilitation

## 2013-12-26 ENCOUNTER — Ambulatory Visit (HOSPITAL_BASED_OUTPATIENT_CLINIC_OR_DEPARTMENT_OTHER): Payer: No Typology Code available for payment source | Admitting: Physical Medicine & Rehabilitation

## 2013-12-26 ENCOUNTER — Ambulatory Visit: Payer: No Typology Code available for payment source | Attending: Internal Medicine

## 2013-12-26 VITALS — BP 135/91 | HR 68 | Resp 14 | Ht 62.0 in | Wt 192.0 lb

## 2013-12-26 DIAGNOSIS — Z79899 Other long term (current) drug therapy: Secondary | ICD-10-CM

## 2013-12-26 DIAGNOSIS — M545 Low back pain: Secondary | ICD-10-CM

## 2013-12-26 DIAGNOSIS — Z5181 Encounter for therapeutic drug level monitoring: Secondary | ICD-10-CM

## 2013-12-26 DIAGNOSIS — G894 Chronic pain syndrome: Secondary | ICD-10-CM

## 2013-12-26 DIAGNOSIS — G8929 Other chronic pain: Secondary | ICD-10-CM
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
## 2013-12-26 NOTE — Patient Instructions (Signed)
Back Exercises These exercises may help you when beginning to rehabilitate your injury. Your symptoms may resolve with or without further involvement from your physician, physical therapist or athletic trainer. While completing these exercises, remember:   Restoring tissue flexibility helps normal motion to return to the joints. This allows healthier, less painful movement and activity.  An effective stretch should be held for at least 30 seconds.  A stretch should never be painful. You should only feel a gentle lengthening or release in the stretched tissue. STRETCH - Extension, Prone on Elbows   Lie on your stomach on the floor, a bed will be too soft. Place your palms about shoulder width apart and at the height of your head.  Place your elbows under your shoulders. If this is too painful, stack pillows under your chest.  Allow your body to relax so that your hips drop lower and make contact more completely with the floor.  Hold this position for __________ seconds.  Slowly return to lying flat on the floor. Repeat __________ times. Complete this exercise __________ times per day.  RANGE OF MOTION - Extension, Prone Press Ups   Lie on your stomach on the floor, a bed will be too soft. Place your palms about shoulder width apart and at the height of your head.  Keeping your back as relaxed as possible, slowly straighten your elbows while keeping your hips on the floor. You may adjust the placement of your hands to maximize your comfort. As you gain motion, your hands will come more underneath your shoulders.  Hold this position __________ seconds.  Slowly return to lying flat on the floor. Repeat __________ times. Complete this exercise __________ times per day.  RANGE OF MOTION- Quadruped, Neutral Spine   Assume a hands and knees position on a firm surface. Keep your hands under your shoulders and your knees under your hips. You may place padding under your knees for  comfort.  Drop your head and point your tail bone toward the ground below you. This will round out your low back like an angry cat. Hold this position for __________ seconds.  Slowly lift your head and release your tail bone so that your back sags into a large arch, like an old horse.  Hold this position for __________ seconds.  Repeat this until you feel limber in your low back.  Now, find your "sweet spot." This will be the most comfortable position somewhere between the two previous positions. This is your neutral spine. Once you have found this position, tense your stomach muscles to support your low back.  Hold this position for __________ seconds. Repeat __________ times. Complete this exercise __________ times per day.  STRETCH - Flexion, Single Knee to Chest   Lie on a firm bed or floor with both legs extended in front of you.  Keeping one leg in contact with the floor, bring your opposite knee to your chest. Hold your leg in place by either grabbing behind your thigh or at your knee.  Pull until you feel a gentle stretch in your low back. Hold __________ seconds.  Slowly release your grasp and repeat the exercise with the opposite side. Repeat __________ times. Complete this exercise __________ times per day.  STRETCH - Hamstrings, Standing  Stand or sit and extend your right / left leg, placing your foot on a chair or foot stool  Keeping a slight arch in your low back and your hips straight forward.  Lead with your chest and   lean forward at the waist until you feel a gentle stretch in the back of your right / left knee or thigh. (When done correctly, this exercise requires leaning only a small distance.)  Hold this position for __________ seconds. Repeat __________ times. Complete this stretch __________ times per day. STRENGTHENING - Deep Abdominals, Pelvic Tilt   Lie on a firm bed or floor. Keeping your legs in front of you, bend your knees so they are both pointed  toward the ceiling and your feet are flat on the floor.  Tense your lower abdominal muscles to press your low back into the floor. This motion will rotate your pelvis so that your tail bone is scooping upwards rather than pointing at your feet or into the floor.  With a gentle tension and even breathing, hold this position for __________ seconds. Repeat __________ times. Complete this exercise __________ times per day.  STRENGTHENING - Abdominals, Crunches   Lie on a firm bed or floor. Keeping your legs in front of you, bend your knees so they are both pointed toward the ceiling and your feet are flat on the floor. Cross your arms over your chest.  Slightly tip your chin down without bending your neck.  Tense your abdominals and slowly lift your trunk high enough to just clear your shoulder blades. Lifting higher can put excessive stress on the low back and does not further strengthen your abdominal muscles.  Control your return to the starting position. Repeat __________ times. Complete this exercise __________ times per day.  STRENGTHENING - Quadruped, Opposite UE/LE Lift   Assume a hands and knees position on a firm surface. Keep your hands under your shoulders and your knees under your hips. You may place padding under your knees for comfort.  Find your neutral spine and gently tense your abdominal muscles so that you can maintain this position. Your shoulders and hips should form a rectangle that is parallel with the floor and is not twisted.  Keeping your trunk steady, lift your right hand no higher than your shoulder and then your left leg no higher than your hip. Make sure you are not holding your breath. Hold this position __________ seconds.  Continuing to keep your abdominal muscles tense and your back steady, slowly return to your starting position. Repeat with the opposite arm and leg. Repeat __________ times. Complete this exercise __________ times per day. Document Released:  02/11/2005 Document Revised: 04/18/2011 Document Reviewed: 05/08/2008 Exit[employer] Patient Information 2015 [employer]. This information is not intended to replace advice given to you by your health care provider. Make sure you discuss any questions you have with your health care provider.  

## 2013-12-27 LAB — PMP ALCOHOL METABOLITE (ETG): ETGU: NEGATIVE ng/mL

## 2013-12-27 MED ORDER — KETOROLAC TROMETHAMINE 60 MG/2ML IM SOLN
30.0000 mg | Freq: Once | INTRAMUSCULAR | Status: AC
  Administered 2013-12-26: 30 mg via INTRAMUSCULAR

## 2013-12-27 NOTE — Addendum Note (Signed)
Addended by: Valeria Batman on: 12/27/2013 02:32 PM   Modules accepted: Orders

## 2013-12-27 NOTE — Progress Notes (Signed)
Medication was given Thursday, December 26, 2013 @ 2:00pm.  It was a verbal order by Dr. Letta Pate.

## 2014-01-01 LAB — BENZODIAZEPINES (GC/LC/MS), URINE
Alprazolam metabolite (GC/LC/MS), ur confirm: NEGATIVE ng/mL (ref <25)
Clonazepam metabolite (GC/LC/MS), ur confirm: NEGATIVE ng/mL (ref <25)
Flurazepam metabolite (GC/LC/MS), ur confirm: NEGATIVE ng/mL (ref <50)
LORAZEPAMU: NEGATIVE ng/mL (ref <50)
Midazolam (GC/LC/MS), ur confirm: NEGATIVE ng/mL (ref <50)
Nordiazepam (GC/LC/MS), ur confirm: NEGATIVE ng/mL (ref <50)
Oxazepam (GC/LC/MS), ur confirm: 103 ng/mL — AB (ref <50)
TEMAZEPAMU: 72 ng/mL — AB (ref <50)
Triazolam metabolite (GC/LC/MS), ur confirm: NEGATIVE ng/mL (ref <50)

## 2014-01-01 LAB — TRAMADOL, URINE
N-DESMETHYL-CIS-TRAMADOL: 350 ng/mL (ref <100)
Tramadol, Urine: 4569 ng/mL (ref <100)

## 2014-01-02 LAB — PRESCRIPTION MONITORING PROFILE (SOLSTAS)
AMPHETAMINE/METH: NEGATIVE ng/mL
BARBITURATE SCREEN, URINE: NEGATIVE ng/mL
Buprenorphine, Urine: NEGATIVE ng/mL
Cannabinoid Scrn, Ur: NEGATIVE ng/mL
Carisoprodol, Urine: NEGATIVE ng/mL
Cocaine Metabolites: NEGATIVE ng/mL
Creatinine, Urine: 118.47 mg/dL (ref >20.0)
Fentanyl, Ur: NEGATIVE ng/mL
MDMA URINE: NEGATIVE ng/mL
Meperidine, Ur: NEGATIVE ng/mL
Methadone Screen, Urine: NEGATIVE ng/mL
Nitrites, Initial: NEGATIVE ug/mL
OPIATE SCREEN, URINE: NEGATIVE ng/mL
OXYCODONE SCRN UR: NEGATIVE ng/mL
PH URINE, INITIAL: 5.3 pH (ref 4.5–8.9)
Propoxyphene: NEGATIVE ng/mL
TAPENTADOLUR: NEGATIVE ng/mL
Zolpidem, Urine: NEGATIVE ng/mL

## 2014-01-06 ENCOUNTER — Ambulatory Visit: Payer: Self-pay | Admitting: Physical Medicine & Rehabilitation

## 2014-01-13 ENCOUNTER — Ambulatory Visit: Payer: No Typology Code available for payment source | Attending: Internal Medicine

## 2014-01-15 NOTE — Progress Notes (Signed)
Urine drug screen for this encounter positive for reported medication

## 2014-01-17 ENCOUNTER — Ambulatory Visit (HOSPITAL_BASED_OUTPATIENT_CLINIC_OR_DEPARTMENT_OTHER): Payer: No Typology Code available for payment source | Admitting: Physical Medicine & Rehabilitation

## 2014-01-17 ENCOUNTER — Encounter: Payer: Self-pay | Admitting: Physical Medicine & Rehabilitation

## 2014-01-17 ENCOUNTER — Encounter: Payer: No Typology Code available for payment source | Attending: Physical Medicine & Rehabilitation

## 2014-01-17 DIAGNOSIS — Z79899 Other long term (current) drug therapy: Secondary | ICD-10-CM | POA: Insufficient documentation

## 2014-01-17 DIAGNOSIS — G894 Chronic pain syndrome: Secondary | ICD-10-CM | POA: Insufficient documentation

## 2014-01-17 DIAGNOSIS — G8929 Other chronic pain: Secondary | ICD-10-CM | POA: Insufficient documentation

## 2014-01-17 DIAGNOSIS — M545 Low back pain, unspecified: Secondary | ICD-10-CM

## 2014-01-17 DIAGNOSIS — M533 Sacrococcygeal disorders, not elsewhere classified: Secondary | ICD-10-CM

## 2014-01-17 DIAGNOSIS — Z5181 Encounter for therapeutic drug level monitoring: Secondary | ICD-10-CM | POA: Insufficient documentation

## 2014-01-17 NOTE — Patient Instructions (Signed)
Sacroiliac injection next visit

## 2014-01-17 NOTE — Progress Notes (Deleted)
   Subjective:    Patient ID: Lindsay Rivera, female    DOB: [birth year], 53 y.o.   MRN: [id number]  HPI    Review of Systems     Objective:   Physical Exam        Assessment & Plan:
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
## 2014-01-20 ENCOUNTER — Ambulatory Visit (HOSPITAL_BASED_OUTPATIENT_CLINIC_OR_DEPARTMENT_OTHER): Payer: No Typology Code available for payment source

## 2014-01-20 ENCOUNTER — Ambulatory Visit: Payer: No Typology Code available for payment source | Attending: Internal Medicine | Admitting: Internal Medicine

## 2014-01-20 ENCOUNTER — Encounter: Payer: Self-pay | Admitting: Physical Medicine & Rehabilitation

## 2014-01-20 ENCOUNTER — Ambulatory Visit (HOSPITAL_BASED_OUTPATIENT_CLINIC_OR_DEPARTMENT_OTHER): Payer: No Typology Code available for payment source | Admitting: Physical Medicine & Rehabilitation

## 2014-01-20 ENCOUNTER — Encounter: Payer: Self-pay | Admitting: Internal Medicine

## 2014-01-20 VITALS — BP 138/90 | HR 98 | Temp 98.0°F | Resp 16 | Wt 189.6 lb

## 2014-01-20 VITALS — BP 142/83 | HR 66 | Resp 14 | Wt 189.6 lb

## 2014-01-20 DIAGNOSIS — Z79899 Other long term (current) drug therapy: Secondary | ICD-10-CM | POA: Insufficient documentation

## 2014-01-20 DIAGNOSIS — F419 Anxiety disorder, unspecified: Secondary | ICD-10-CM | POA: Insufficient documentation

## 2014-01-20 DIAGNOSIS — Z23 Encounter for immunization: Secondary | ICD-10-CM

## 2014-01-20 DIAGNOSIS — E038 Other specified hypothyroidism: Secondary | ICD-10-CM

## 2014-01-20 DIAGNOSIS — J45909 Unspecified asthma, uncomplicated: Secondary | ICD-10-CM | POA: Insufficient documentation

## 2014-01-20 DIAGNOSIS — Z90710 Acquired absence of both cervix and uterus: Secondary | ICD-10-CM | POA: Insufficient documentation

## 2014-01-20 DIAGNOSIS — F329 Major depressive disorder, single episode, unspecified: Secondary | ICD-10-CM | POA: Insufficient documentation

## 2014-01-20 DIAGNOSIS — K219 Gastro-esophageal reflux disease without esophagitis: Secondary | ICD-10-CM | POA: Insufficient documentation

## 2014-01-20 DIAGNOSIS — Z8673 Personal history of transient ischemic attack (TIA), and cerebral infarction without residual deficits: Secondary | ICD-10-CM | POA: Insufficient documentation

## 2014-01-20 DIAGNOSIS — M549 Dorsalgia, unspecified: Secondary | ICD-10-CM | POA: Insufficient documentation

## 2014-01-20 DIAGNOSIS — Z951 Presence of aortocoronary bypass graft: Secondary | ICD-10-CM | POA: Insufficient documentation

## 2014-01-20 DIAGNOSIS — Z7982 Long term (current) use of aspirin: Secondary | ICD-10-CM | POA: Insufficient documentation

## 2014-01-20 DIAGNOSIS — M533 Sacrococcygeal disorders, not elsewhere classified: Secondary | ICD-10-CM

## 2014-01-20 DIAGNOSIS — E039 Hypothyroidism, unspecified: Secondary | ICD-10-CM | POA: Insufficient documentation

## 2014-01-20 DIAGNOSIS — E119 Type 2 diabetes mellitus without complications: Secondary | ICD-10-CM | POA: Insufficient documentation

## 2014-01-20 DIAGNOSIS — I10 Essential (primary) hypertension: Secondary | ICD-10-CM | POA: Insufficient documentation

## 2014-01-20 DIAGNOSIS — I252 Old myocardial infarction: Secondary | ICD-10-CM | POA: Insufficient documentation

## 2014-01-20 DIAGNOSIS — Z87891 Personal history of nicotine dependence: Secondary | ICD-10-CM | POA: Insufficient documentation

## 2014-01-20 DIAGNOSIS — E785 Hyperlipidemia, unspecified: Secondary | ICD-10-CM | POA: Insufficient documentation

## 2014-01-20 DIAGNOSIS — G8929 Other chronic pain: Secondary | ICD-10-CM | POA: Insufficient documentation

## 2014-01-20 LAB — TSH: TSH: 4.56 u[IU]/mL — ABNORMAL HIGH (ref 0.350–4.500)

## 2014-01-20 NOTE — Progress Notes (Signed)

## 2014-01-20 NOTE — Progress Notes (Signed)
MRN: [id number] Name: Lindsay Rivera  Sex: female Age: 53 y.o. DOB: Nov 05, 1960  Allergies: Penicillins and Latex  Chief Complaint  Patient presents with  . Follow-up    HPI: Patient is 53 y.o. female who has history of chronic low back pain, following up with orthopedics and already had steroid injections given, she also history of hypothyroidism, on the last visit she was advised to increase the dose of levothyroxine to 100 mcg daily, as per patient she has been taking it yet microgram and only for the last one week she has started taking 100 mcg, needs to repeat the blood test, patient would like to get a flu shot today. Also she is due for mammogram which already has been ordered.  Past Medical History  Diagnosis Date  . Depression   . Bipolar disorder   . History of hysterectomy   . 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 MI (myocardial infarction) 02/08/2013  . Diabetes mellitus without complication   . Hypertension   . Asthma   . Arthritis   . Anxiety   . GERD (gastroesophageal reflux disease)   . Hyperlipidemia   . Constipation   . CVA (cerebral infarction) 2010  . Hypothyroidism     Past Surgical History  Procedure Laterality Date  . Coronary artery bypass graft  02/10/2013  . Thyroid surgery  85/2014  . Appendectomy  2008  . Abdominal hysterectomy  2004  . Left knee surgery   1994      Medication List       This list is accurate as of: 01/20/14  2:59 PM.  Always use your most recent med list.               albuterol 108 (90 BASE) MCG/ACT inhaler  Commonly known as:  PROVENTIL HFA;VENTOLIN HFA  Inhale 2 puffs into the lungs every 6 (six) hours as needed for wheezing.     aspirin EC 325 MG tablet  Take 325 mg by mouth daily.     atorvastatin 20 MG tablet  Commonly known as:  LIPITOR  Take 1 tablet (20 mg total) by mouth at bedtime.     buPROPion 150 MG 24 hr tablet  Commonly known as:  WELLBUTRIN XL  Take 150 mg by mouth every evening.     Calcium  Carbonate-Vitamin D 600-400 MG-UNIT per chew tablet  Chew 1 tablet by mouth daily.     clotrimazole 1 % cream  Commonly known as:  LOTRIMIN  Apply 1 application topically 2 (two) times daily.     diazepam 5 MG tablet  Commonly known as:  VALIUM  Take 5 mg by mouth 2 (two) times daily as needed for anxiety.     furosemide 20 MG tablet  Commonly known as:  LASIX  Take 20 mg by mouth.     gabapentin 300 MG capsule  Commonly known as:  NEURONTIN  Take 600 mg by mouth 3 (three) times daily.     isosorbide mononitrate 30 MG 24 hr tablet  Commonly known as:  IMDUR  Take 1 tablet (30 mg total) by mouth daily.     methocarbamol 750 MG tablet  Commonly known as:  ROBAXIN  Take 1,500 mg by mouth 3 (three) times daily.     MIRALAX PO  Take by mouth daily.     nitroGLYCERIN 0.4 MG SL tablet  Commonly known as:  NITROSTAT  PLACE ONE TABLET UNDER THE TONGUE EVERY 5 MINUTES AS NEEDED FOR  CHEST PAIN     omeprazole 40 MG capsule  Commonly known as:  PRILOSEC  Take 1 capsule (40 mg total) by mouth 2 (two) times daily.     propranolol 20 MG tablet  Commonly known as:  INDERAL  Take 20 mg by mouth 2 (two) times daily as needed (anxiety).     QUEtiapine 400 MG tablet  Commonly known as:  SEROQUEL  Take 600 mg by mouth at bedtime.     QUEtiapine 50 MG tablet  Commonly known as:  SEROQUEL  Take 50 mg by mouth 2 (two) times daily.     levothyroxine 88 MCG tablet  Commonly known as:  SYNTHROID, LEVOTHROID     SYNTHROID 100 MCG tablet  Generic drug:  levothyroxine        No orders of the defined types were placed in this encounter.    Immunization History  Administered Date(s) Administered  . Influenza,inj,Quad PF,36+ Mos 01/20/2014  . Pneumococcal Polysaccharide-23 07/21/2013    Family History  Problem Relation Age of Onset  . Diabetes Mother   . Hypertension Mother   . Heart disease Father   . Hypertension Father   . Cancer Father     colon cancer   . Colon cancer  Father   . Colon cancer Maternal Grandfather     History  Substance Use Topics  . Smoking status: Former Smoker    Quit date: 02/08/2013  . Smokeless tobacco: Never Used  . Alcohol Use: Yes     Comment: rare glass of wine    Review of Systems   As noted in HPI  Filed Vitals:   01/20/14 1412  BP: 138/90  Pulse: 98  Temp: 98 F (36.7 C)  Resp: 16    Physical Exam  Physical Exam  Constitutional: No distress.  Eyes: EOM are normal. Pupils are equal, round, and reactive to light.  Cardiovascular: Normal rate and regular rhythm.   Pulmonary/Chest: Breath sounds normal. No respiratory distress. She has no wheezes. She has no rales.  Musculoskeletal: She exhibits no edema.    CBC    Component Value Date/Time   WBC 10.6* 11/12/2013 1601   RBC 4.32 11/12/2013 1601   HGB 12.7 11/12/2013 1601   HCT 38.3 11/12/2013 1601   PLT 451* 11/12/2013 1601   MCV 88.7 11/12/2013 1601   LYMPHSABS 1.9 11/12/2013 1601   MONOABS 0.6 11/12/2013 1601   EOSABS 0.3 11/12/2013 1601   BASOSABS 0.0 11/12/2013 1601    CMP     Component Value Date/Time   NA 140 11/12/2013 1601   K 4.1 11/12/2013 1601   CL 102 11/12/2013 1601   CO2 27 11/12/2013 1601   GLUCOSE 108* 11/12/2013 1601   BUN 13 11/12/2013 1601   CREATININE 0.98 11/12/2013 1601   CREATININE 0.95 07/21/2013 0436   CALCIUM 9.4 11/12/2013 1601   PROT 7.4 11/12/2013 1601   ALBUMIN 4.1 11/12/2013 1601   AST 15 11/12/2013 1601   ALT 14 11/12/2013 1601   ALKPHOS 123* 11/12/2013 1601   BILITOT 0.4 11/12/2013 1601   GFRNONAA 66 11/12/2013 1601   GFRNONAA 67* 07/21/2013 0436   GFRAA 76 11/12/2013 1601   GFRAA 78* 07/21/2013 0436    Lab Results  Component Value Date/Time   CHOL 196 11/12/2013 04:01 PM    No components found for: HGA1C  Lab Results  Component Value Date/Time   AST 15 11/12/2013 04:01 PM    Assessment and Plan  Other specified hypothyroidism - Plan: will  check TSH level, she just started taking 100  mcg of levothyroxine for the last one week prior to that she was taking 88 mcg daily.  Needs flu shot Flu shot given today.   Health Maintenance  -Mammogram: already ordered  -Vaccinations:  -flu shot today   Return in about 3 months (around 04/21/2014) for hypothyroid.  Lorayne Marek, MD

## 2014-01-20 NOTE — Patient Instructions (Signed)
Sacroiliac injection was performed today. A combination of a naming medicine plus a cortisone medicine was injected. The injection was done under x-ray guidance. This procedure has been performed to help reduce low back and buttocks pain as well as potentially hip pain. The duration of this injection is variable lasting from hours to  Months. It may repeated if needed. 

## 2014-01-20 NOTE — Progress Notes (Signed)
Patient here for follow up on her chronic back pain Just left ortho office and had an injection into her back Has some concerns as to what dose of her thyroid medication she should be [place]

## 2014-01-20 NOTE — Progress Notes (Signed)
  PROCEDURE RECORD [facility]   Name: Lindsay Rivera DOB:May 18, 1960 MRN: [id number]  Date:01/20/2014  Physician: Alysia Penna, MD    Nurse/CMA: Shumaker RN Allergies:  Allergies  Allergen Reactions  . Penicillins Swelling  . Latex Rash    Consent Signed: Yes.    Is patient diabetic? Yes.    CBG today?   Pregnant: No. LMP: No LMP recorded. Patient has had a hysterectomy. (age 53-55)  Anticoagulants: no Anti-inflammatory: no Antibiotics: no  Procedure: Right Sacroiliac Steroid Injection Position: Prone Start Time: 1:28 End Time:  1:32 Fluoro Time: 10.2sec  RN/CMA Biomedical engineer    Time 12:58 1:38    BP 142/83 149/78    Pulse 66 70    Respirations 14 14    O2 Sat 99 99    S/S 6 6    Pain Level 9 7     D/C home with son, patient A & O X 3, D/C instructions reviewed, and sits independently.

## 2014-01-22 ENCOUNTER — Telehealth: Payer: Self-pay | Admitting: Emergency Medicine

## 2014-01-22 MED ORDER — LEVOTHYROXINE SODIUM 100 MCG PO TABS: 100.0000 ug | ORAL_TABLET | Freq: Every day | ORAL | Status: DC

## 2014-01-22 NOTE — Telephone Encounter (Signed)
Attempted to reach pt with results and med instructions;number listed busy Will try again. Medication Levothyroxine 100 mcg reordered and e-scribed to [facility]

## 2014-01-22 NOTE — Telephone Encounter (Signed)
-----   Message from Lorayne Marek, MD sent at 01/21/2014  9:37 AM EST ----- Call and let the patient know that her TSH level is still borderline abnormal she should take the levothyroxine 100 mcg daily will repeat the level on the next visit

## 2014-02-04 ENCOUNTER — Other Ambulatory Visit: Payer: Self-pay

## 2014-02-04 DIAGNOSIS — Z1239 Encounter for other screening for malignant neoplasm of breast: Secondary | ICD-10-CM

## 2014-02-10 ENCOUNTER — Ambulatory Visit (HOSPITAL_BASED_OUTPATIENT_CLINIC_OR_DEPARTMENT_OTHER): Payer: Self-pay | Admitting: Physical Medicine & Rehabilitation

## 2014-02-10 ENCOUNTER — Encounter: Payer: No Typology Code available for payment source | Attending: Physical Medicine & Rehabilitation

## 2014-02-10 ENCOUNTER — Encounter: Payer: Self-pay | Admitting: Physical Medicine & Rehabilitation

## 2014-02-10 VITALS — BP 144/82 | HR 70 | Resp 14

## 2014-02-10 DIAGNOSIS — G894 Chronic pain syndrome: Secondary | ICD-10-CM | POA: Insufficient documentation

## 2014-02-10 DIAGNOSIS — Z5181 Encounter for therapeutic drug level monitoring: Secondary | ICD-10-CM | POA: Insufficient documentation

## 2014-02-10 DIAGNOSIS — G8929 Other chronic pain: Secondary | ICD-10-CM | POA: Insufficient documentation

## 2014-02-10 DIAGNOSIS — M545 Low back pain: Secondary | ICD-10-CM

## 2014-02-10 DIAGNOSIS — M5431 Sciatica, right side: Secondary | ICD-10-CM

## 2014-02-10 DIAGNOSIS — Z79899 Other long term (current) drug therapy: Secondary | ICD-10-CM | POA: Insufficient documentation

## 2014-02-10 NOTE — Patient Instructions (Signed)

## 2014-02-10 NOTE — Progress Notes (Signed)
  PROCEDURE RECORD [facility]   Name: Lindsay Rivera DOB:01/25/1961 MRN: [id number]  Date:02/10/2014  Physician: Alysia Penna, MD    Nurse/CMA: Shumaker RN  Allergies:  Allergies  Allergen Reactions  . Penicillins Swelling  . Latex Rash    Consent Signed: Yes.    Is patient diabetic? Yes.    CBG today? 12  Pregnant: No. LMP: No LMP recorded. Patient has had a hysterectomy. (age 54-55)  Anticoagulants: no--325 mg asa Anti-inflammatory: no Antibiotics: no  Procedure: right L5 epidural steroid  injection Position: Prone Start Time: 12:42 End Time: 12:47 Fluoro Time:16 seconeds  RN/CMA Health and safety inspector RN    Time 12:03 12:55    BP 144/82 152/92    Pulse 70 69    Respirations 14 14    O2 Sat 96 100    S/S 6 6    Pain Level 10/10 9/10     D/C home with son, patient A & O X 3, D/C instructions reviewed, and sits independently.

## 2014-02-10 NOTE — Progress Notes (Signed)
Lumbar transforaminal epidural steroid injection under fluoroscopic guidance  Indication: Lumbosacral radiculitis is not relieved by medication management or other conservative care and interfering with self-care and mobility. No compressive lesions on MRI but had prior relief with ESI, Pain radiating from back to R dorsal foot  Informed consent was obtained after describing risk and benefits of the procedure with the patient, this includes bleeding, bruising, infection, paralysis and medication side effects.  The patient wishes to proceed and has given written consent.  Patient was placed in prone position.  The lumbar area was marked and prepped with Betadine.  It was entered with a 25-gauge 1-1/2 inch needle and one mL of 1% lidocaine was injected into the skin and subcutaneous tissue.  Then a 22-gauge 5 inch spinal needle was inserted into the Right L5-S1 intervertebral foramen under AP, lateral, and oblique view.  Then a solution containing one mL of 10 mg per mL dexamethasone and 2 mL of 1% lidocaine was injected.  The patient tolerated procedure well.  Post procedure instructions were given.  Please see post procedure form.

## 2014-02-12 ENCOUNTER — Other Ambulatory Visit: Payer: Self-pay | Admitting: Internal Medicine

## 2014-02-13 ENCOUNTER — Other Ambulatory Visit: Payer: Self-pay

## 2014-02-13 MED ORDER — PROPRANOLOL HCL 20 MG PO TABS: 20.0000 mg | ORAL_TABLET | Freq: Two times a day (BID) | ORAL | Status: DC | PRN

## 2014-02-13 MED ORDER — BUPROPION HCL ER (XL) 150 MG PO TB24: 150.0000 mg | ORAL_TABLET | Freq: Every evening | ORAL | Status: DC

## 2014-02-17 ENCOUNTER — Other Ambulatory Visit: Payer: Self-pay

## 2014-02-17 MED ORDER — LEVOTHYROXINE SODIUM 100 MCG PO TABS: 100.0000 ug | ORAL_TABLET | Freq: Every day | ORAL | Status: DC

## 2014-02-20 ENCOUNTER — Ambulatory Visit: Payer: No Typology Code available for payment source | Admitting: Physical Medicine & Rehabilitation

## 2014-02-25 ENCOUNTER — Ambulatory Visit (HOSPITAL_COMMUNITY): Payer: MEDICAID

## 2014-02-26 ENCOUNTER — Ambulatory Visit (HOSPITAL_COMMUNITY)
Admission: RE | Admit: 2014-02-26 | Discharge: 2014-02-26 | Disposition: A | Discharge status: Home / Self Care | Payer: Self-pay | Source: Ambulatory Visit | Attending: Internal Medicine | Admitting: Internal Medicine

## 2014-02-26 DIAGNOSIS — Z1231 Encounter for screening mammogram for malignant neoplasm of breast: Secondary | ICD-10-CM | POA: Insufficient documentation

## 2014-02-26 DIAGNOSIS — Z1239 Encounter for other screening for malignant neoplasm of breast: Secondary | ICD-10-CM

## 2014-02-27 ENCOUNTER — Ambulatory Visit: Payer: Self-pay | Admitting: Physical Medicine & Rehabilitation

## 2014-03-03 ENCOUNTER — Ambulatory Visit (HOSPITAL_BASED_OUTPATIENT_CLINIC_OR_DEPARTMENT_OTHER): Payer: Self-pay | Admitting: Physical Medicine & Rehabilitation

## 2014-03-03 ENCOUNTER — Ambulatory Visit: Payer: Self-pay | Admitting: Physical Medicine & Rehabilitation

## 2014-03-03 ENCOUNTER — Telehealth: Payer: Self-pay

## 2014-03-03 DIAGNOSIS — M533 Sacrococcygeal disorders, not elsewhere classified: Secondary | ICD-10-CM

## 2014-03-03 DIAGNOSIS — G8929 Other chronic pain: Secondary | ICD-10-CM

## 2014-03-03 DIAGNOSIS — M545 Low back pain: Secondary | ICD-10-CM

## 2014-03-03 MED ORDER — CYCLOBENZAPRINE HCL 5 MG PO TABS: 5.0000 mg | ORAL_TABLET | Freq: Two times a day (BID) | ORAL | Status: DC | PRN

## 2014-03-03 MED ORDER — DICLOFENAC EPOLAMINE 1.3 % TD PTCH: 1.0000 | MEDICATED_PATCH | Freq: Two times a day (BID) | TRANSDERMAL | Status: DC

## 2014-03-03 NOTE — Telephone Encounter (Signed)
Lindsay Rivera [phone]  Ray left message that she could not make appointment today and wants to reschedule. I returned call and left message for her to call back and reschedule.

## 2014-03-03 NOTE — Progress Notes (Signed)
Ms Hapke called and let us know it was Flector Patches that she had used and would like some called to pharmacy.  Dr Letta Pate notified and an order placed
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
## 2014-03-03 NOTE — Patient Instructions (Addendum)
Diagnosis Sacroiliac injection  Limited pain medicine choices  Back Exercises These exercises may help you when beginning to rehabilitate your injury. Your symptoms may resolve with or without further involvement from your physician, physical therapist or athletic trainer. While completing these exercises, remember:   Restoring tissue flexibility helps normal motion to return to the joints. This allows healthier, less painful movement and activity.  An effective stretch should be held for at least 30 seconds.  A stretch should never be painful. You should only feel a gentle lengthening or release in the stretched tissue. STRETCH - Extension, Prone on Elbows   Lie on your stomach on the floor, a bed will be too soft. Place your palms about shoulder width apart and at the height of your head.  Place your elbows under your shoulders. If this is too painful, stack pillows under your chest.  Allow your body to relax so that your hips drop lower and make contact more completely with the floor.  Hold this position for __________ seconds.  Slowly return to lying flat on the floor. Repeat __________ times. Complete this exercise __________ times per day.  RANGE OF MOTION - Extension, Prone Press Ups   Lie on your stomach on the floor, a bed will be too soft. Place your palms about shoulder width apart and at the height of your head.  Keeping your back as relaxed as possible, slowly straighten your elbows while keeping your hips on the floor. You may adjust the placement of your hands to maximize your comfort. As you gain motion, your hands will come more underneath your shoulders.  Hold this position __________ seconds.  Slowly return to lying flat on the floor. Repeat __________ times. Complete this exercise __________ times per day.  RANGE OF MOTION- Quadruped, Neutral Spine   Assume a hands and knees position on a firm surface. Keep your hands under your shoulders and your knees under  your hips. You may place padding under your knees for comfort.  Drop your head and point your tail bone toward the ground below you. This will round out your low back like an angry cat. Hold this position for __________ seconds.  Slowly lift your head and release your tail bone so that your back sags into a large arch, like an old horse.  Hold this position for __________ seconds.  Repeat this until you feel limber in your low back.  Now, find your "sweet spot." This will be the most comfortable position somewhere between the two previous positions. This is your neutral spine. Once you have found this position, tense your stomach muscles to support your low back.  Hold this position for __________ seconds. Repeat __________ times. Complete this exercise __________ times per day.  STRETCH - Flexion, Single Knee to Chest   Lie on a firm bed or floor with both legs extended in front of you.  Keeping one leg in contact with the floor, bring your opposite knee to your chest. Hold your leg in place by either grabbing behind your thigh or at your knee.  Pull until you feel a gentle stretch in your low back. Hold __________ seconds.  Slowly release your grasp and repeat the exercise with the opposite side. Repeat __________ times. Complete this exercise __________ times per day.  STRETCH - Hamstrings, Standing  Stand or sit and extend your right / left leg, placing your foot on a chair or foot stool  Keeping a slight arch in your low back and your  hips straight forward.  Lead with your chest and lean forward at the waist until you feel a gentle stretch in the back of your right / left knee or thigh. (When done correctly, this exercise requires leaning only a small distance.)  Hold this position for __________ seconds. Repeat __________ times. Complete this stretch __________ times per day. STRENGTHENING - Deep Abdominals, Pelvic Tilt   Lie on a firm bed or floor. Keeping your legs in  front of you, bend your knees so they are both pointed toward the ceiling and your feet are flat on the floor.  Tense your lower abdominal muscles to press your low back into the floor. This motion will rotate your pelvis so that your tail bone is scooping upwards rather than pointing at your feet or into the floor.  With a gentle tension and even breathing, hold this position for __________ seconds. Repeat __________ times. Complete this exercise __________ times per day.  STRENGTHENING - Abdominals, Crunches   Lie on a firm bed or floor. Keeping your legs in front of you, bend your knees so they are both pointed toward the ceiling and your feet are flat on the floor. Cross your arms over your chest.  Slightly tip your chin down without bending your neck.  Tense your abdominals and slowly lift your trunk high enough to just clear your shoulder blades. Lifting higher can put excessive stress on the low back and does not further strengthen your abdominal muscles.  Control your return to the starting position. Repeat __________ times. Complete this exercise __________ times per day.  STRENGTHENING - Quadruped, Opposite UE/LE Lift   Assume a hands and knees position on a firm surface. Keep your hands under your shoulders and your knees under your hips. You may place padding under your knees for comfort.  Find your neutral spine and gently tense your abdominal muscles so that you can maintain this position. Your shoulders and hips should form a rectangle that is parallel with the floor and is not twisted.  Keeping your trunk steady, lift your right hand no higher than your shoulder and then your left leg no higher than your hip. Make sure you are not holding your breath. Hold this position __________ seconds.  Continuing to keep your abdominal muscles tense and your back steady, slowly return to your starting position. Repeat with the opposite arm and leg. Repeat __________ times. Complete this  exercise __________ times per day. Document Released: 02/11/2005 Document Revised: 04/18/2011 Document Reviewed: 05/08/2008 [facility] Patient Information 2015 [place], [place]. This information is not intended to replace advice given to you by your health care provider. Make sure you discuss any questions you have with your health care provider.

## 2014-03-05 ENCOUNTER — Telehealth: Payer: Self-pay

## 2014-03-05 ENCOUNTER — Ambulatory Visit: Payer: Self-pay | Attending: Internal Medicine | Admitting: Internal Medicine

## 2014-03-05 ENCOUNTER — Encounter: Payer: Self-pay | Admitting: Internal Medicine

## 2014-03-05 VITALS — BP 130/80 | HR 86 | Temp 98.0°F | Resp 16 | Wt 193.4 lb

## 2014-03-05 DIAGNOSIS — J45909 Unspecified asthma, uncomplicated: Secondary | ICD-10-CM | POA: Insufficient documentation

## 2014-03-05 DIAGNOSIS — Z79899 Other long term (current) drug therapy: Secondary | ICD-10-CM | POA: Insufficient documentation

## 2014-03-05 DIAGNOSIS — E119 Type 2 diabetes mellitus without complications: Secondary | ICD-10-CM | POA: Insufficient documentation

## 2014-03-05 DIAGNOSIS — Z951 Presence of aortocoronary bypass graft: Secondary | ICD-10-CM | POA: Insufficient documentation

## 2014-03-05 DIAGNOSIS — Z87891 Personal history of nicotine dependence: Secondary | ICD-10-CM | POA: Insufficient documentation

## 2014-03-05 DIAGNOSIS — R1013 Epigastric pain: Secondary | ICD-10-CM

## 2014-03-05 DIAGNOSIS — Z7982 Long term (current) use of aspirin: Secondary | ICD-10-CM | POA: Insufficient documentation

## 2014-03-05 DIAGNOSIS — E785 Hyperlipidemia, unspecified: Secondary | ICD-10-CM | POA: Insufficient documentation

## 2014-03-05 DIAGNOSIS — K219 Gastro-esophageal reflux disease without esophagitis: Secondary | ICD-10-CM | POA: Insufficient documentation

## 2014-03-05 DIAGNOSIS — E039 Hypothyroidism, unspecified: Secondary | ICD-10-CM | POA: Insufficient documentation

## 2014-03-05 DIAGNOSIS — F419 Anxiety disorder, unspecified: Secondary | ICD-10-CM | POA: Insufficient documentation

## 2014-03-05 DIAGNOSIS — I10 Essential (primary) hypertension: Secondary | ICD-10-CM | POA: Insufficient documentation

## 2014-03-05 DIAGNOSIS — I252 Old myocardial infarction: Secondary | ICD-10-CM | POA: Insufficient documentation

## 2014-03-05 DIAGNOSIS — Z90710 Acquired absence of both cervix and uterus: Secondary | ICD-10-CM | POA: Insufficient documentation

## 2014-03-05 DIAGNOSIS — F319 Bipolar disorder, unspecified: Secondary | ICD-10-CM | POA: Insufficient documentation

## 2014-03-05 DIAGNOSIS — Z8673 Personal history of transient ischemic attack (TIA), and cerebral infarction without residual deficits: Secondary | ICD-10-CM | POA: Insufficient documentation

## 2014-03-05 DIAGNOSIS — E038 Other specified hypothyroidism: Secondary | ICD-10-CM

## 2014-03-05 LAB — TSH: TSH: 6.901 u[IU]/mL — ABNORMAL HIGH (ref 0.350–4.500)

## 2014-03-05 NOTE — Telephone Encounter (Signed)
Lindsay Rivera  [phone]  Tine called to say the patches that was prescribed for her is $625.00 and she can not afford that, so she is wandering if there is something else she can get that would be cheaper.

## 2014-03-05 NOTE — Progress Notes (Signed)
MRN: [id number] Name: Lindsay Rivera  Sex: female Age: 54 y.o. DOB: 06-17-1960  Allergies: Penicillins and Latex  Chief Complaint  Patient presents with  . Follow-up    HPI: Patient is 54 y.o. female who history of hypothyroidism, hyperlipidemia , dyspepsia/GERD,comes today complaining of worsening reflux symptoms, bloating, she has been taking Prilosec 40 mg daily but recently she has been having worsening symptoms, she denies any change in bowel habits she already had colonoscopy done and polyps were removed.patient also history of hypothyroidism currently taking levothyroxine 100 mcg daily, as per patient she has gained weight recently. Need to repeat her test.  Past Medical History  Diagnosis Date  . Depression   . Bipolar disorder   . History of hysterectomy   . 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 MI (myocardial infarction) 02/08/2013  . Diabetes mellitus without complication   . Hypertension   . Asthma   . Arthritis   . Anxiety   . GERD (gastroesophageal reflux disease)   . Hyperlipidemia   . Constipation   . CVA (cerebral infarction) 2010  . Hypothyroidism     Past Surgical History  Procedure Laterality Date  . Coronary artery bypass graft  02/10/2013  . Thyroid surgery  85/2014  . Appendectomy  2008  . Abdominal hysterectomy  2004  . Left knee surgery   1994      Medication List       This list is accurate as of: 03/05/14  9:44 AM.  Always use your most recent med list.               albuterol 108 (90 BASE) MCG/ACT inhaler  Commonly known as:  PROVENTIL HFA;VENTOLIN HFA  Inhale 2 puffs into the lungs every 6 (six) hours as needed for wheezing.     aspirin EC 325 MG tablet  Take 325 mg by mouth daily.     atorvastatin 20 MG tablet  Commonly known as:  LIPITOR  TAKE 1 TABLET BY MOUTH EVERY NIGHT AT BEDTIME     buPROPion 150 MG 24 hr tablet  Commonly known as:  WELLBUTRIN XL  Take 1 tablet (150 mg total) by mouth every evening.     Calcium Carbonate-Vitamin  D 600-400 MG-UNIT per chew tablet  Chew 1 tablet by mouth daily.     clotrimazole 1 % cream  Commonly known as:  LOTRIMIN  Apply 1 application topically 2 (two) times daily.     cyclobenzaprine 5 MG tablet  Commonly known as:  FLEXERIL  Take 1 tablet (5 mg total) by mouth 2 (two) times daily as needed for muscle spasms.     diazepam 5 MG tablet  Commonly known as:  VALIUM  Take 5 mg by mouth 2 (two) times daily as needed for anxiety.     diclofenac 1.3 % Ptch  Commonly known as:  FLECTOR  Place 1 patch onto the skin 2 (two) times daily.     furosemide 20 MG tablet  Commonly known as:  LASIX  Take 20 mg by mouth.     gabapentin 300 MG capsule  Commonly known as:  NEURONTIN  Take 600 mg by mouth 3 (three) times daily.     isosorbide mononitrate 30 MG 24 hr tablet  Commonly known as:  IMDUR  TAKE 1 TABLET BY MOUTH ONCE DAILY     levothyroxine 100 MCG tablet  Commonly known as:  SYNTHROID  Take 1 tablet (100 mcg total) by mouth daily before breakfast.  methocarbamol 750 MG tablet  Commonly known as:  ROBAXIN  Take 1,500 mg by mouth 3 (three) times daily.     MIRALAX PO  Take by mouth daily.     nitroGLYCERIN 0.4 MG SL tablet  Commonly known as:  NITROSTAT  PLACE ONE TABLET UNDER THE TONGUE EVERY 5 MINUTES AS NEEDED FOR CHEST PAIN     omeprazole 40 MG capsule  Commonly known as:  PRILOSEC  TAKE 1 CAPSULE BY MOUTH TWICE DAILY     propranolol 20 MG tablet  Commonly known as:  INDERAL  Take 1 tablet (20 mg total) by mouth 2 (two) times daily as needed (anxiety).     QUEtiapine 400 MG tablet  Commonly known as:  SEROQUEL  Take 600 mg by mouth at bedtime.     QUEtiapine 50 MG tablet  Commonly known as:  SEROQUEL  Take 50 mg by mouth 2 (two) times daily.        No orders of the defined types were placed in this encounter.    Immunization History  Administered Date(s) Administered  . Influenza,inj,Quad PF,36+ Mos 01/20/2014  . Pneumococcal  Polysaccharide-23 07/21/2013    Family History  Problem Relation Age of Onset  . Diabetes Mother   . Hypertension Mother   . Heart disease Father   . Hypertension Father   . Cancer Father     colon cancer   . Colon cancer Father   . Colon cancer Maternal Grandfather     History  Substance Use Topics  . Smoking status: Former Smoker    Quit date: 02/08/2013  . Smokeless tobacco: Never Used  . Alcohol Use: Yes     Comment: rare glass of wine    Review of Systems   As noted in HPI  Filed Vitals:   03/05/14 0940  BP: 130/80  Pulse:   Temp:   Resp:     Physical Exam  Physical Exam  Constitutional: No distress.  Cardiovascular: Normal rate and regular rhythm.   Pulmonary/Chest: Breath sounds normal. No respiratory distress. She has no wheezes. She has no rales.  Abdominal: Bowel sounds are normal. There is no tenderness. There is no rebound.  Musculoskeletal: She exhibits no edema.    CBC    Component Value Date/Time   WBC 10.6* 11/12/2013 1601   RBC 4.32 11/12/2013 1601   HGB 12.7 11/12/2013 1601   HCT 38.3 11/12/2013 1601   PLT 451* 11/12/2013 1601   MCV 88.7 11/12/2013 1601   LYMPHSABS 1.9 11/12/2013 1601   MONOABS 0.6 11/12/2013 1601   EOSABS 0.3 11/12/2013 1601   BASOSABS 0.0 11/12/2013 1601    CMP     Component Value Date/Time   NA 140 11/12/2013 1601   K 4.1 11/12/2013 1601   CL 102 11/12/2013 1601   CO2 27 11/12/2013 1601   GLUCOSE 108* 11/12/2013 1601   BUN 13 11/12/2013 1601   CREATININE 0.98 11/12/2013 1601   CREATININE 0.95 07/21/2013 0436   CALCIUM 9.4 11/12/2013 1601   PROT 7.4 11/12/2013 1601   ALBUMIN 4.1 11/12/2013 1601   AST 15 11/12/2013 1601   ALT 14 11/12/2013 1601   ALKPHOS 123* 11/12/2013 1601   BILITOT 0.4 11/12/2013 1601   GFRNONAA 66 11/12/2013 1601   GFRNONAA 67* 07/21/2013 0436   GFRAA 76 11/12/2013 1601   GFRAA 78* 07/21/2013 0436    Lab Results  Component Value Date/Time   CHOL 196 11/12/2013 04:01 PM     No components found for:  HGA1C  Lab Results  Component Value Date/Time   AST 15 11/12/2013 04:01 PM    Assessment and Plan  Other specified hypothyroidism - Plan: currently patient is on levothyroxine 100 mcg daily, will repeat TSH level Dyspepsia - Plan: US Abdomen Complete, Ambulatory referral to Gastroenterology  Gastroesophageal reflux disease, esophagitis presence not specified - Plan:patient is currently on Prilosec  Ambulatory referral to Gastroenterology   Health Maintenance -Colonoscopy: uptodate  -Pap Smear: will be scheduled  -Mammogram: uptodate  -Vaccinations:  uptodate with flu shot    Return in about 3 months (around 06/04/2014) for hypothyroid, Schedule Appt with Dr Burman Freestone for PAP.  Lorayne Marek, MD

## 2014-03-05 NOTE — Progress Notes (Signed)
Patient here for follow up Complains of distended swollen stomach SOB when lying down Decreased appetite Stomach bloating Nausea felling and heartburn Patient states she would like to be checked for  ascites

## 2014-03-06 ENCOUNTER — Telehealth: Payer: Self-pay | Admitting: *Deleted

## 2014-03-06 ENCOUNTER — Other Ambulatory Visit: Payer: Self-pay | Admitting: *Deleted

## 2014-03-06 MED ORDER — LEVOTHYROXINE SODIUM 112 MCG PO TABS: 112.0000 ug | ORAL_TABLET | Freq: Every day | ORAL | Status: DC

## 2014-03-06 MED ORDER — DICLOFENAC SODIUM 1 % TD GEL: 4.0000 g | Freq: Four times a day (QID) | TRANSDERMAL | Status: DC

## 2014-03-06 NOTE — Telephone Encounter (Signed)
-----   Message from Lorayne Marek, MD sent at 03/06/2014  9:32 AM EST ----- Blood work reviewed, her TSH level is still abnormal, currently patient is taking levothyroxine 100 mcg daily, advise patient to increase the dose of levothyroxine to 112 mcg daily, will repeat the level on the following visit.

## 2014-03-06 NOTE — Telephone Encounter (Signed)
Called patient to let her know that we called in the Voltaren Gel to her pharmacy.

## 2014-03-06 NOTE — Telephone Encounter (Signed)
Pt saw Dr. On Monday, patches were ordered, they cost too much, pt was then given script for cream, pt read the instructions, they say to not use if you are taking aspirin, pt says she takes aspirin everyday following heart attack, pt also says she is taking muscle relaxers but they do nothing to help her with her pain, asking for call back

## 2014-03-06 NOTE — Telephone Encounter (Signed)
Call in voltaren gel 3% apply QID , 3 tubes, 3 RF

## 2014-03-06 NOTE — Telephone Encounter (Signed)
Unable to contact Pt, phone busy Rx was change and send to [place]

## 2014-03-07 ENCOUNTER — Ambulatory Visit (HOSPITAL_COMMUNITY)
Admission: RE | Admit: 2014-03-07 | Discharge: 2014-03-07 | Disposition: A | Discharge status: Home / Self Care | Payer: Self-pay | Source: Ambulatory Visit | Attending: Internal Medicine | Admitting: Internal Medicine

## 2014-03-07 DIAGNOSIS — E119 Type 2 diabetes mellitus without complications: Secondary | ICD-10-CM | POA: Insufficient documentation

## 2014-03-07 DIAGNOSIS — R1013 Epigastric pain: Secondary | ICD-10-CM | POA: Insufficient documentation

## 2014-03-07 NOTE — Telephone Encounter (Signed)
Pt came to our office, copy or lab results was given to pt Advised Rx in [place]

## 2014-03-11 ENCOUNTER — Telehealth: Payer: Self-pay | Admitting: *Deleted

## 2014-03-11 NOTE — Telephone Encounter (Signed)
Because the cream is not absorbed into the system there is no danger of using with aspirin. Once again the precautions pertain to the oral form of diclofenac.

## 2014-03-11 NOTE — Telephone Encounter (Signed)
Unable to contact Pt Phone number not a working number

## 2014-03-11 NOTE — Telephone Encounter (Signed)
-----   Message from Lorayne Marek, MD sent at 03/10/2014 11:41 AM EST ----- Call and let the patient know that the ultrasound reported  IMPRESSION: Increased hepatic echotexture suggests fatty infiltration. There is no acute intra-abdominal abnormality demonstrated.  Advise patient to avoid alcohol, and do regular diet/ exercise to loose weight.

## 2014-03-12 ENCOUNTER — Other Ambulatory Visit: Payer: Self-pay

## 2014-03-12 ENCOUNTER — Ambulatory Visit: Payer: Self-pay | Attending: Cardiology | Admitting: Cardiology

## 2014-03-12 ENCOUNTER — Encounter: Payer: Self-pay | Admitting: Cardiology

## 2014-03-12 ENCOUNTER — Ambulatory Visit (HOSPITAL_COMMUNITY)
Admission: RE | Admit: 2014-03-12 | Discharge: 2014-03-12 | Disposition: A | Discharge status: Home / Self Care | Payer: Self-pay | Source: Ambulatory Visit | Attending: Cardiology | Admitting: Cardiology

## 2014-03-12 VITALS — BP 143/94 | HR 72 | Resp 20 | Ht 62.0 in | Wt 192.0 lb

## 2014-03-12 DIAGNOSIS — I2510 Atherosclerotic heart disease of native coronary artery without angina pectoris: Secondary | ICD-10-CM | POA: Insufficient documentation

## 2014-03-12 DIAGNOSIS — Z6831 Body mass index (BMI) 31.0-31.9, adult: Secondary | ICD-10-CM | POA: Insufficient documentation

## 2014-03-12 DIAGNOSIS — E785 Hyperlipidemia, unspecified: Secondary | ICD-10-CM | POA: Insufficient documentation

## 2014-03-12 DIAGNOSIS — R0789 Other chest pain: Secondary | ICD-10-CM | POA: Insufficient documentation

## 2014-03-12 DIAGNOSIS — E669 Obesity, unspecified: Secondary | ICD-10-CM | POA: Insufficient documentation

## 2014-03-12 DIAGNOSIS — Z951 Presence of aortocoronary bypass graft: Secondary | ICD-10-CM | POA: Insufficient documentation

## 2014-03-12 DIAGNOSIS — R072 Precordial pain: Secondary | ICD-10-CM | POA: Insufficient documentation

## 2014-03-12 DIAGNOSIS — F319 Bipolar disorder, unspecified: Secondary | ICD-10-CM | POA: Insufficient documentation

## 2014-03-12 DIAGNOSIS — G894 Chronic pain syndrome: Secondary | ICD-10-CM | POA: Insufficient documentation

## 2014-03-12 MED ORDER — ATORVASTATIN CALCIUM 40 MG PO TABS
40.0000 mg | ORAL_TABLET | Freq: Every day | ORAL | Status: DC
Start: 2014-03-12 — End: 2015-04-16

## 2014-03-12 NOTE — Assessment & Plan Note (Signed)
LDL not at goal. Increase atorvastatin to 40 mg daily. Repeat fasting lipids and LFTs in 6 weeks. Goal LDL less than 100.

## 2014-03-12 NOTE — Telephone Encounter (Signed)
Called patient back to let her know what the doctor said.  She was rude and curt with me.  Would not even let me speak, and stated that her Cardiologist will tell her what she can and cannot have.  I explained that we were waiting to hear back from the doctor and I apologized but she hung up on me.  This is not the first problem we have had with this patients attitude.  Just wanted to let you know. She is also an orange card recipient.

## 2014-03-12 NOTE — Progress Notes (Signed)
Ms. Lindsay Rivera returns today for the evaluation and management of her history of coronary artery bypass surgery, coronary artery disease, chest Louna Rothgeb pain and multiple risk factors.  She continues to have pain from the sternotomy. Is been almost a year since surgery. It is very tender to touch and hurts to turn her chest or body and cough. She does not have any angina.  She has a chronic pain syndrome which she is being treated for. Please see her long med list. She was prescribed Voltaren gel which she has not started for fear that it would interfere with her aspirin. She is also bipolar.  We performed a stress nuclear study last June 2015 for chest pain. It was remarkable for no ischemia or scar tissue. Her ejection fraction was normal at 60%.  She denies any fever, chills, hemoptysis or productive sputum.  Her exam today shows her to be very pleasant as usual. She's no acute distress. There is exquisite tenderness along the sternotomy site. I could detect no movement or rocking with body torsion. Heart exam is normal except for a poorly appreciated PMI and a soft S1-S2. Lungs were clear. Extremities reveal no cyanosis clubbing or edema. Pulses are intact.  EKG was repeated today which shows normal sinus rhythm with ST T-wave changes in 1 and aVL which are stable since last year. No changes from previous EKG.

## 2014-03-12 NOTE — Progress Notes (Signed)
Patient has history of coronary artery disease-s/p CABG x3 in 1/15 Patient denies chest pain at this time but indicates she still has tenderness at her incision site She indicates she has had two episodes of chest pain since last appointment-last 12/15 which was relieved by 2 NTG Patient indicates she has shortness of breath with exertion and swelling in hands and lower extremities Patient concerned about being prescribed Voltaren gel as literature indicates she should not take with ASA.   Spoke with Dr. Verl Blalock who indicates patient should continue taking ASA.

## 2014-03-12 NOTE — Assessment & Plan Note (Signed)
By history and exam this is from her sternotomy site. It does not feel unstable to my exam. It is certainly too far out to be having pain however. She has had some pain ever since her surgery last year.  Advised to restart her Voltaren. I will arrange for a cardiothoracic surgery consultation. As I explained to her, if the sternum is not unstable per their evaluation, it will be simply pain management. She will continue to follow along with the pain center. I'm reluctant to make any changes in her medical profile from that perspective.

## 2014-03-12 NOTE — Assessment & Plan Note (Signed)
Stable. Previous nuclear stress study June 2015 negative for ischemia or scar tissue with an EF of 60%. Continue secondary preventative strategies.

## 2014-03-12 NOTE — Patient Instructions (Addendum)
It was great seeing you again. It is important that you continue taking aspirin as prescribed. You can begin using Voltaren gel. Your Lipitor has been increased from 20 mg to 40 mg daily. Please return in 6 weeks for fasting bloodwork.

## 2014-03-12 NOTE — Telephone Encounter (Signed)
She is then an injection only pt

## 2014-03-12 NOTE — Assessment & Plan Note (Signed)
Encouraged to lose weight. This will also help with her back pain.

## 2014-03-14 ENCOUNTER — Other Ambulatory Visit: Payer: Self-pay | Admitting: Family Medicine

## 2014-03-20 ENCOUNTER — Telehealth: Payer: Self-pay

## 2014-03-20 ENCOUNTER — Telehealth: Payer: Self-pay | Admitting: Internal Medicine

## 2014-03-20 NOTE — Telephone Encounter (Signed)
Patient called to request her results for the ultrasound she had performed, please f/u with pt.

## 2014-03-20 NOTE — Telephone Encounter (Signed)
Patient is aware of her lab results and her [place] results

## 2014-03-20 NOTE — Telephone Encounter (Signed)
-----   Message from Lorayne Marek, MD sent at 03/06/2014  9:32 AM EST ----- Blood work reviewed, her TSH level is still abnormal, currently patient is taking levothyroxine 100 mcg daily, advise patient to increase the dose of levothyroxine to 112 mcg daily, will repeat the level on the following visit.

## 2014-03-26 ENCOUNTER — Institutional Professional Consult (permissible substitution) (INDEPENDENT_AMBULATORY_CARE_PROVIDER_SITE_OTHER): Payer: Self-pay | Admitting: Surgery

## 2014-03-26 ENCOUNTER — Encounter: Payer: Self-pay | Admitting: Surgery

## 2014-03-26 ENCOUNTER — Other Ambulatory Visit: Payer: Self-pay | Admitting: *Deleted

## 2014-03-26 VITALS — BP 127/80 | HR 82 | Resp 16 | Ht 62.0 in | Wt 186.2 lb

## 2014-03-26 DIAGNOSIS — G8928 Other chronic postprocedural pain: Secondary | ICD-10-CM

## 2014-03-26 DIAGNOSIS — R079 Chest pain, unspecified: Secondary | ICD-10-CM

## 2014-03-26 DIAGNOSIS — Z951 Presence of aortocoronary bypass graft: Secondary | ICD-10-CM

## 2014-03-27 ENCOUNTER — Encounter: Payer: Self-pay | Admitting: Surgery

## 2014-03-27 ENCOUNTER — Other Ambulatory Visit: Payer: Self-pay | Admitting: Family Medicine

## 2014-03-27 NOTE — Progress Notes (Signed)
Cardiothoracic Surgery Consultation  PCP is Lorayne Marek, MD Referring Provider is Verl Blalock, Marijo Conception, MD  Chief Complaint  Patient presents with  . Incisional Pain    of the sternal incision s/p CABG X 3 02/08/13 in [place].Marland KitchenMarland KitchenIS BEING MANAGED BY PAIN\ MANAGEMENT    HPI:  The patient is a 54 year old woman with multiple medical problems as noted below who underwent CABG x 3 on 02/11/2013 in [place]. She had a LIMA to the LAD, a free RIMA to the diagonal, and a SVG to the RCA. She has since moved back to [place] to be closer to family. She has been seen by Dr. Verl Blalock earlier this month and was complaining of persistent pain related to the sternotomy. She says her chest is tender to touch and hurts with coughing or twisting. She did have a nuclear stress test in June 2015 when she presented with chest pain and it was negative for ischemia with an EF of 60%.   Past Medical History  Diagnosis Date  . Depression   . Bipolar disorder   . History of hysterectomy   . 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 MI (myocardial infarction) 02/08/2013  . Diabetes mellitus without complication   . Hypertension   . Asthma   . Arthritis   . Anxiety   . GERD (gastroesophageal reflux disease)   . Hyperlipidemia   . Constipation   . CVA (cerebral infarction) 2010  . Hypothyroidism     Past Surgical History  Procedure Laterality Date  . Coronary artery bypass graft  02/10/2013  . Thyroid surgery  85/2014  . Appendectomy  2008  . Abdominal hysterectomy  2004  . Left knee surgery   1994    Family History  Problem Relation Age of Onset  . Diabetes Mother   . Hypertension Mother   . Heart disease Father   . Hypertension Father   . Cancer Father     colon cancer   . Colon cancer Father   . Colon cancer Maternal Grandfather     Social History History  Substance Use Topics  . Smoking status: Former Smoker    Quit date: 02/08/2013  . Smokeless tobacco: Never Used  . Alcohol Use: Yes     Comment: rare  glass of wine    Current Outpatient Prescriptions  Medication Sig Dispense Refill  . albuterol (PROVENTIL HFA;VENTOLIN HFA) 108 (90 BASE) MCG/ACT inhaler Inhale 2 puffs into the lungs every 6 (six) hours as needed for wheezing. 3 Inhaler 12  . aspirin EC 325 MG tablet Take 325 mg by mouth daily.    Marland Kitchen atorvastatin (LIPITOR) 40 MG tablet Take 1 tablet (40 mg total) by mouth at bedtime. 30 tablet 12  . buPROPion (WELLBUTRIN XL) 150 MG 24 hr tablet Take 1 tablet (150 mg total) by mouth every evening. 30 tablet 0  . Calcium Carbonate-Vitamin D 600-400 MG-UNIT per chew tablet Chew 1 tablet by mouth daily.    . clotrimazole (LOTRIMIN) 1 % cream Apply 1 application topically 2 (two) times daily.    . diazepam (VALIUM) 5 MG tablet Take 5 mg by mouth 2 (two) times daily as needed for anxiety.    . diclofenac sodium (VOLTAREN) 1 % GEL Apply 4 g topically 4 (four) times daily. 1 Tube 3  . furosemide (LASIX) 20 MG tablet Take 20 mg by mouth.    . levothyroxine (SYNTHROID, LEVOTHROID) 112 MCG tablet Take 1 tablet (112 mcg total) by mouth daily.  30 tablet 3  . nitroGLYCERIN (NITROSTAT) 0.4 MG SL tablet PLACE ONE TABLET UNDER THE TONGUE EVERY 5 MINUTES AS NEEDED FOR CHEST PAIN 50 tablet 0  . omeprazole (PRILOSEC) 40 MG capsule TAKE 1 CAPSULE BY MOUTH TWICE DAILY 30 capsule 1  . Polyethylene Glycol 3350 (MIRALAX PO) Take by mouth daily.    . propranolol (INDERAL) 20 MG tablet Take 1 tablet (20 mg total) by mouth 2 (two) times daily as needed (anxiety). 60 tablet 0  . QUEtiapine (SEROQUEL) 400 MG tablet Take 600 mg by mouth at bedtime.    Marland Kitchen QUEtiapine (SEROQUEL) 50 MG tablet Take 50 mg by mouth 2 (two) times daily.    . isosorbide mononitrate (IMDUR) 30 MG 24 hr tablet TAKE 1 TABLET BY MOUTH ONCE DAILY 30 tablet 1   No current facility-administered medications for this visit.    Allergies  Allergen Reactions  . Penicillins Swelling  . Latex Rash    Review of Systems  Constitutional: Positive for  fatigue. Negative for fever, chills, activity change and appetite change.       Weight gain   HENT: Positive for dental problem.   Eyes: Negative.   Respiratory: Positive for cough, shortness of breath and wheezing.   Cardiovascular: Positive for chest pain and leg swelling.  Gastrointestinal: Positive for abdominal pain and constipation.       Difficulty swallowing  Endocrine:       Diabetes  Genitourinary: Positive for frequency.  Musculoskeletal: Positive for gait problem.  Skin: Positive for rash.  Allergic/Immunologic: Negative.   Neurological: Positive for headaches.       Chronic pain  Numbness in hands when sleeping  Hematological: Negative.   Psychiatric/Behavioral:       Bipolar    BP 127/80 mmHg  Pulse 82  Resp 16  Ht 5\' 2"  (1.575 m)  Wt 186 lb 3.2 oz (84.46 kg)  BMI 34.05 kg/m2 Physical Exam  Constitutional: She is oriented to person, place, and time. She appears well-developed and well-nourished. No distress.  HENT:  Head: Normocephalic and atraumatic.  Eyes: EOM are normal. Pupils are equal, round, and reactive to light.  Neck: Normal range of motion. Neck supple.  Cardiovascular: Normal rate, regular rhythm and normal heart sounds.   No murmur heard. Pulmonary/Chest: Effort normal and breath sounds normal. No respiratory distress. She has no wheezes. She has no rales.  Tender along sternotomy incision as well as along both sides particularly along the upper portion.  The incision is well-healed  Sternum is stable with cough  Abdominal: Soft. Bowel sounds are normal. She exhibits no distension. There is no tenderness.  Musculoskeletal: Normal range of motion. She exhibits no edema.  Neurological: She is alert and oriented to person, place, and time.  Skin: Skin is warm and dry.  Psychiatric: She has a normal mood and affect.     Diagnostic Tests:  CLINICAL DATA: Chest tenderness. Evaluate for sternal wound infection.  EXAM: CT CHEST WITH  CONTRAST  TECHNIQUE: Multidetector CT imaging of the chest was performed during intravenous contrast administration.  CONTRAST: 79mL OMNIPAQUE IOHEXOL 300 MG/ML SOLN  COMPARISON: 07/19/2013 and CT from 6/4/5  FINDINGS: No pleural effusion identified. Scar is identified within the anterior left upper lobe. Left upper lobe nodule measures 4 mm, image 18/series 3.  The patient is status post median sternotomy and CABG procedure. There appears to be at least partial bony bridging of the sternotomy wound. No evidence for dehiscence. No peristernal fluid collections identified. No  gas or fluid collections identified within the anterior mediastinum. The heart size is normal. No pericardial effusion. Right hilar node measures 1.4 cm, image 23/series 2. No mediastinal or left hilar adenopathy.  There is no enlarged axillary or supraclavicular adenopathy. Incidental imaging through the upper abdomen is on unremarkable. Review of the visualized bony structures is significant for mild spondylosis within the thoracic spine.  IMPRESSION: 1. There has been at least partial fusion of the sternotomy defect compatible with healing. No peristernal fluid collections or a gas identified to suggest abscess. 2. Borderline enlarged right hilar lymph node is of on certain clinical significance. 3. Left upper lobe pulmonary nodule measures 4 mm. Unchanged from 2005 and most likely benign.   Electronically Signed  By: Kerby Moors M.D.  On: 07/20/2013 15:54   Impression:  Her sternotomy appears well-healed but she is having prolonged anterior chest wall pain over 1 year after her surgery. This may be due to the sternal wires. She had a CT scan of the chest last June that showed partial fusion of the sternum. I will repeat the scan and if the sternum is adequately healed I would recommend removing the sternal wires through a couple small incisions to see if that will improve her  symptoms. I discussed this with her and her significant other and they are in agreement.   Plan:  She will have a CT scan of the chest and return to see me afterward.

## 2014-03-31 ENCOUNTER — Emergency Department (HOSPITAL_COMMUNITY): Payer: Self-pay

## 2014-03-31 ENCOUNTER — Emergency Department (HOSPITAL_COMMUNITY)
Admission: EM | Admit: 2014-03-31 | Discharge: 2014-04-01 | Disposition: A | Discharge status: Home / Self Care | Payer: Self-pay | Attending: Emergency Medicine | Admitting: Emergency Medicine

## 2014-03-31 ENCOUNTER — Encounter (HOSPITAL_COMMUNITY): Payer: Self-pay | Admitting: *Deleted

## 2014-03-31 DIAGNOSIS — M1990 Unspecified osteoarthritis, unspecified site: Secondary | ICD-10-CM | POA: Insufficient documentation

## 2014-03-31 DIAGNOSIS — F319 Bipolar disorder, unspecified: Secondary | ICD-10-CM | POA: Insufficient documentation

## 2014-03-31 DIAGNOSIS — R079 Chest pain, unspecified: Secondary | ICD-10-CM

## 2014-03-31 DIAGNOSIS — Z7982 Long term (current) use of aspirin: Secondary | ICD-10-CM | POA: Insufficient documentation

## 2014-03-31 DIAGNOSIS — K088 Other specified disorders of teeth and supporting structures: Secondary | ICD-10-CM | POA: Insufficient documentation

## 2014-03-31 DIAGNOSIS — E785 Hyperlipidemia, unspecified: Secondary | ICD-10-CM | POA: Insufficient documentation

## 2014-03-31 DIAGNOSIS — K0889 Other specified disorders of teeth and supporting structures: Secondary | ICD-10-CM

## 2014-03-31 DIAGNOSIS — Z951 Presence of aortocoronary bypass graft: Secondary | ICD-10-CM | POA: Insufficient documentation

## 2014-03-31 DIAGNOSIS — E039 Hypothyroidism, unspecified: Secondary | ICD-10-CM | POA: Insufficient documentation

## 2014-03-31 DIAGNOSIS — J45909 Unspecified asthma, uncomplicated: Secondary | ICD-10-CM | POA: Insufficient documentation

## 2014-03-31 DIAGNOSIS — I252 Old myocardial infarction: Secondary | ICD-10-CM | POA: Insufficient documentation

## 2014-03-31 DIAGNOSIS — Z880 Allergy status to penicillin: Secondary | ICD-10-CM | POA: Insufficient documentation

## 2014-03-31 DIAGNOSIS — K029 Dental caries, unspecified: Secondary | ICD-10-CM | POA: Insufficient documentation

## 2014-03-31 DIAGNOSIS — K219 Gastro-esophageal reflux disease without esophagitis: Secondary | ICD-10-CM | POA: Insufficient documentation

## 2014-03-31 DIAGNOSIS — R0789 Other chest pain: Secondary | ICD-10-CM | POA: Insufficient documentation

## 2014-03-31 DIAGNOSIS — F419 Anxiety disorder, unspecified: Secondary | ICD-10-CM | POA: Insufficient documentation

## 2014-03-31 DIAGNOSIS — Z8673 Personal history of transient ischemic attack (TIA), and cerebral infarction without residual deficits: Secondary | ICD-10-CM | POA: Insufficient documentation

## 2014-03-31 DIAGNOSIS — Z791 Long term (current) use of non-steroidal anti-inflammatories (NSAID): Secondary | ICD-10-CM | POA: Insufficient documentation

## 2014-03-31 DIAGNOSIS — I2510 Atherosclerotic heart disease of native coronary artery without angina pectoris: Secondary | ICD-10-CM | POA: Insufficient documentation

## 2014-03-31 DIAGNOSIS — Z91040 Latex allergy status: Secondary | ICD-10-CM | POA: Insufficient documentation

## 2014-03-31 DIAGNOSIS — Z79899 Other long term (current) drug therapy: Secondary | ICD-10-CM | POA: Insufficient documentation

## 2014-03-31 DIAGNOSIS — Z87891 Personal history of nicotine dependence: Secondary | ICD-10-CM | POA: Insufficient documentation

## 2014-03-31 LAB — I-STAT TROPONIN, ED: TROPONIN I, POC: 0 ng/mL (ref 0.00–0.08)

## 2014-03-31 LAB — BASIC METABOLIC PANEL
Anion gap: 9 (ref 5–15)
BUN: 13 mg/dL (ref 6–23)
CO2: 26 mmol/L (ref 19–32)
CREATININE: 0.79 mg/dL (ref 0.50–1.10)
Calcium: 9.5 mg/dL (ref 8.4–10.5)
Chloride: 107 mmol/L (ref 96–112)
GFR calc Af Amer: >90 mL/min (ref >90)
Glucose, Bld: 111 mg/dL — ABNORMAL HIGH (ref 70–99)
Potassium: 3.7 mmol/L (ref 3.5–5.1)
SODIUM: 142 mmol/L (ref 135–145)

## 2014-03-31 LAB — CBC
HCT: 40.2 % (ref 36.0–46.0)
HEMOGLOBIN: 12.8 g/dL (ref 12.0–15.0)
MCH: 27.5 pg (ref 26.0–34.0)
MCHC: 31.8 g/dL (ref 30.0–36.0)
MCV: 86.5 fL (ref 78.0–100.0)
PLATELETS: 379 10*3/uL (ref 150–400)
RBC: 4.65 MIL/uL (ref 3.87–5.11)
RDW: 15.5 % (ref 11.5–15.5)
WBC: 8.8 10*3/uL (ref 4.0–10.5)

## 2014-03-31 MED ORDER — BUPIVACAINE-EPINEPHRINE (PF) 0.5% -1:200000 IJ SOLN
1.8000 mL | Freq: Once | INTRAMUSCULAR | Status: AC
  Administered 2014-04-01: 1.8 mL
  Filled 2014-03-31: qty 1.8

## 2014-03-31 MED ORDER — ASPIRIN 325 MG PO TABS
325.0000 mg | ORAL_TABLET | ORAL | Status: AC
  Administered 2014-03-31: 325 mg via ORAL
  Filled 2014-03-31: qty 1

## 2014-03-31 NOTE — ED Provider Notes (Signed)
CSN: [id number]     Arrival date & time 03/31/14  1727 History   First MD Initiated Contact with Patient 03/31/14 2226     Chief Complaint  Patient presents with  . Dental Pain  . Chest Pain   (Consider location/radiation/quality/duration/timing/severity/associated sxs/prior Treatment) HPI Lindsay Rivera is a 54 yo female presenting with chest pain and tooth pain.  She reports intermittent chest pain x 2 weeks. She has seen her cardiologist and CT surgery who has evaluated her for removal of sternotomy wires due to this pain. She said the pain began again today at 3 pm and she became concerned because it reminded her of the pain she had when she had her heart attack.  She describes the pain as sharp in the left side of her chest but does not radiate anywhere.  She states she has intermittent tingling in her fingers as well.  Her most bothersome complaint is her tooth pain which has been ongoing as well but most recently began hurting her two weeks ago.  She has a bad tooth in her right upper and right lower jaw that are painful.  She denies fevers, diaphoresis, shortness of breath, nausea, or vomiting.      Past Medical History  Diagnosis Date  . Depression   . Bipolar disorder   . History of hysterectomy   . 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 MI (myocardial infarction) 02/08/2013  . Diabetes mellitus without complication   . Hypertension   . Asthma   . Arthritis   . Anxiety   . GERD (gastroesophageal reflux disease)   . Hyperlipidemia   . Constipation   . CVA (cerebral infarction) 2010  . Hypothyroidism    Past Surgical History  Procedure Laterality Date  . Coronary artery bypass graft  02/10/2013  . Thyroid surgery  85/2014  . Appendectomy  2008  . Abdominal hysterectomy  2004  . Left knee surgery   1994   Family History  Problem Relation Age of Onset  . Diabetes Mother   . Hypertension Mother   . Heart disease Father   . Hypertension Father   . Cancer Father     colon cancer   .  Colon cancer Father   . Colon cancer Maternal Grandfather    History  Substance Use Topics  . Smoking status: Former Smoker    Quit date: 02/08/2013  . Smokeless tobacco: Never Used  . Alcohol Use: Yes     Comment: rare glass of wine   OB History    No data available     Review of Systems  Constitutional: Negative for fever and chills.  HENT: Positive for dental problem. Negative for facial swelling and sore throat.   Eyes: Negative for visual disturbance.  Respiratory: Negative for cough and shortness of breath.   Cardiovascular: Positive for chest pain. Negative for leg swelling.  Gastrointestinal: Negative for nausea, vomiting and diarrhea.  Genitourinary: Negative for dysuria.  Musculoskeletal: Negative for myalgias.  Skin: Negative for rash.  Neurological: Negative for weakness, numbness and headaches.      Allergies  Penicillins and Latex  Home Medications   Prior to Admission medications   Medication Sig Start Date End Date Taking? Authorizing Provider  albuterol (PROVENTIL HFA;VENTOLIN HFA) 108 (90 BASE) MCG/ACT inhaler Inhale 2 puffs into the lungs every 6 (six) hours as needed for wheezing. 08/28/13  Yes Tresa Garter, MD  aspirin EC 325 MG tablet Take 325 mg by mouth daily.  Yes Historical Provider, MD  atorvastatin (LIPITOR) 40 MG tablet Take 1 tablet (40 mg total) by mouth at bedtime. 03/12/14  Yes Renella Cunas, MD  buPROPion (WELLBUTRIN XL) 150 MG 24 hr tablet Take 1 tablet (150 mg total) by mouth every evening. 02/13/14  Yes Lorayne Marek, MD  Calcium Carbonate-Vitamin D 600-400 MG-UNIT per chew tablet Chew 1 tablet by mouth daily.   Yes Historical Provider, MD  clotrimazole (LOTRIMIN) 1 % cream Apply 1 application topically 2 (two) times daily as needed. Fungus   Yes Historical Provider, MD  diazepam (VALIUM) 5 MG tablet Take 5 mg by mouth 2 (two) times daily as needed for anxiety.   Yes Historical Provider, MD  diclofenac sodium (VOLTAREN) 1 % GEL Apply  4 g topically 4 (four) times daily. 03/06/14  Yes Charlett Blake, MD  ibuprofen (ADVIL,MOTRIN) 200 MG tablet Take 200 mg by mouth every 6 (six) hours as needed for moderate pain.   Yes Historical Provider, MD  isosorbide mononitrate (IMDUR) 30 MG 24 hr tablet TAKE 1 TABLET BY MOUTH ONCE DAILY 02/12/14  Yes Deepak Advani, MD  levothyroxine (SYNTHROID, LEVOTHROID) 112 MCG tablet Take 1 tablet (112 mcg total) by mouth daily. 03/06/14  Yes Deepak Advani, MD  lidocaine (LIDODERM) 5 % Place 1 patch onto the skin daily as needed (pain). Remove & Discard patch within 12 hours or as directed by MD   Yes Historical Provider, MD  nitroGLYCERIN (NITROSTAT) 0.4 MG SL tablet PLACE ONE TABLET UNDER THE TONGUE EVERY 5 MINUTES AS NEEDED FOR CHEST PAIN 12/03/13  Yes Deepak Advani, MD  omeprazole (PRILOSEC) 40 MG capsule TAKE 1 CAPSULE BY MOUTH TWICE DAILY 02/12/14  Yes Lorayne Marek, MD  oxyCODONE-acetaminophen (PERCOCET) 7.5-325 MG per tablet Take 1 tablet by mouth every 4 (four) hours as needed for pain.   Yes Historical Provider, MD  Polyethylene Glycol 3350 (MIRALAX PO) Take 17 g by mouth daily as needed. Constipation   Yes Historical Provider, MD  propranolol (INDERAL) 20 MG tablet Take 1 tablet (20 mg total) by mouth 2 (two) times daily as needed (anxiety). 02/13/14  Yes Lorayne Marek, MD  QUEtiapine (SEROQUEL) 400 MG tablet Take 600 mg by mouth at bedtime.   Yes Historical Provider, MD  QUEtiapine (SEROQUEL) 50 MG tablet Take 50 mg by mouth 2 (two) times daily.   Yes Historical Provider, MD   BP 154/88 mmHg  Pulse 75  Temp(Src) 98.4 F (36.9 C) (Oral)  Resp 14  SpO2 99% Physical Exam  Constitutional: She appears well-developed and well-nourished. No distress.  HENT:  Head: Normocephalic and atraumatic.  Mouth/Throat: Oropharynx is clear and moist. No trismus in the jaw. Abnormal dentition. Dental caries present. No dental abscesses. No oropharyngeal exudate.    Fractured right upper canine and right lower  2nd premolar.   Eyes: Conjunctivae are normal.  Neck: Neck supple.  Cardiovascular: Normal rate, regular rhythm and intact distal pulses.   Pulmonary/Chest: Effort normal and breath sounds normal. No respiratory distress. She has no wheezes. She has no rales. She exhibits tenderness.    Abdominal: Soft. There is no tenderness.  Musculoskeletal: She exhibits no tenderness.  Lymphadenopathy:    She has no cervical adenopathy.  Neurological: She is alert.  Skin: Skin is warm and dry. No rash noted. She is not diaphoretic.  Psychiatric: She has a normal mood and affect.  Nursing note and vitals reviewed.   ED Course  Procedures (including critical care time)  NERVE BLOCK Performed by: Glade Lloyd,  Macaela Presas Consent: Verbal consent obtained. Required items: required blood products, implants, devices, and special equipment available Time out: Immediately prior to procedure a "time out" was called to verify the correct patient, procedure, equipment, support staff and site/side marked as required.  Indication: dental pain Nerve block body site: inferior alveolar nerve block  Preparation: Patient was prepped and draped in the usual sterile fashion. Needle gauge: 15 G Location technique: anatomical landmarks  Local anesthetic: bupivicaine with epinephrine  Anesthetic total: 1 ml  Outcome: pain improved Patient tolerance: Patient tolerated the procedure well with no immediate complications.  NERVE BLOCK Performed by: Britt Bottom Consent: Verbal consent obtained. Required items: required blood products, implants, devices, and special equipment available Time out: Immediately prior to procedure a "time out" was called to verify the correct patient, procedure, equipment, support staff and site/side marked as required.  Indication: dental pain Nerve block body site: posterior superior alveolar nerve block  Preparation: Patient was prepped and draped in the usual sterile  fashion. Needle gauge: 67 G Location technique: anatomical landmarks  Local anesthetic: bupivicaine with epinephrine  Anesthetic total: 1 ml  Outcome: pain improved Patient tolerance: Patient tolerated the procedure well with no immediate complications.   Labs Review Labs Reviewed  BASIC METABOLIC PANEL - Abnormal; Notable for the following:    Glucose, Bld 111 (*)    All other components within normal limits  CBC  I-STAT TROPOININ, ED  Randolm Idol, ED    Imaging Review Dg Chest 2 View  03/31/2014   CLINICAL DATA:  Chest tightness.  History of prior CABG.  EXAM: CHEST - 2 VIEW  COMPARISON:  07/19/2013  FINDINGS: The heart size and mediastinal contours are stable. Stable appearance of retained epicardial pacing lead. Lungs show mild bibasilar atelectasis. There is no evidence of pulmonary edema, consolidation, pneumothorax, nodule or pleural fluid. The visualized skeletal structures are unremarkable.  IMPRESSION: Mild bibasilar atelectasis.  No active disease.   Electronically Signed   By: Aletta Edouard M.D.   On: 03/31/2014 19:28     EKG Interpretation   Date/Time:  Monday March 31 2014 22:51:20 EST Ventricular Rate:  76 PR Interval:  183 QRS Duration: 81 QT Interval:  392 QTC Calculation: 441 R Axis:   52 Text Interpretation:  Sinus rhythm LAE, consider biatrial enlargement Low  voltage, precordial leads Borderline T wave abnormalities Confirmed by  OTTER  MD, OLGA ([id number]) on 04/01/2014 12:10:32 AM      MDM   Final diagnoses:  Pain, dental  Chest pain, unspecified chest pain type   54 yo with persistent chest pain evaluated by her cardiologist and CT surgery. EKG, troponin and repeat troponin negative for acute abnormality.  She also has recurrent dental pain.  Pain resolved after dental block.  Discussed importance of following up with dentist as soon as possible.  She has no indication of dental abscess, Ludwig's angina or trismus. Discharged with abx and  short course of pain meds. Pt is well-appearing, in no acute distress and vital signs are stable.  She appears safe to be discharged.  Return precautions provided.  Pt aware of plan and in agreement. Discussed case with Dr. Sharol Given.   Filed Vitals:   03/31/14 1826 03/31/14 1827 03/31/14 2253  BP:  134/86 154/88  Pulse: 78  75  Temp: 98.4 F (36.9 C)    TempSrc: Oral    Resp: 17  14  SpO2: 99%  99%   Meds given in ED:  Medications  aspirin tablet 325  mg (325 mg Oral Given 03/31/14 2255)  bupivacaine-epinephrine (MARCAINE W/ EPI) 0.5% -1:200000 injection 1.8 mL (1.8 mLs Infiltration Given by Other 04/01/14 0007)    Discharge Medication List as of 04/01/2014 12:32 AM    START taking these medications   Details  clindamycin (CLEOCIN) 150 MG capsule Take 2 capsules (300 mg total) by mouth 3 (three) times daily. May dispense as 150mg  capsules, Starting 04/01/2014, Until Discontinued, Print    HYDROcodone-acetaminophen (NORCO/VICODIN) 5-325 MG per tablet Take 1 tablet by mouth every 4 (four) hours as needed for moderate pain or severe pain., Starting 04/01/2014, Until Discontinued, Print    naproxen (NAPROSYN) 500 MG tablet Take 1 tablet (500 mg total) by mouth 2 (two) times daily., Starting 04/01/2014, Until Discontinued, Print           Britt Bottom, NP 04/02/14 [id number]  Kalman Drape, MD 04/02/14 [id number]

## 2014-03-31 NOTE — ED Notes (Signed)
Pt reports chest tightness x 2 weeks, went to see her cardiologist last week and was given a referral to see another heart doctor to have a CT done to see if "my bone has healed and remove stents out."  Pt also reports R lower and upper detnal pain x 2 days.

## 2014-04-01 LAB — I-STAT TROPONIN, ED: TROPONIN I, POC: 0 ng/mL (ref 0.00–0.08)

## 2014-04-01 MED ORDER — HYDROCODONE-ACETAMINOPHEN 5-325 MG PO TABS: 1.0000 | ORAL_TABLET | ORAL | Status: DC | PRN

## 2014-04-01 MED ORDER — NAPROXEN 500 MG PO TABS: 500.0000 mg | ORAL_TABLET | Freq: Two times a day (BID) | ORAL | Status: DC

## 2014-04-01 MED ORDER — CLINDAMYCIN HCL 150 MG PO CAPS: 300.0000 mg | ORAL_CAPSULE | Freq: Three times a day (TID) | ORAL | Status: DC

## 2014-04-01 NOTE — Discharge Instructions (Signed)
Please follow the directions provided.  It is very important to call the dentist tomorrow for an appointment to manage this tooth pain.  Take the antibiotics as directed until it is all gone.  Take the Naproxen twice a day to help with pain.  Take the vicodin as needed for pain not relieved by the naproxen.  Don't hesitate to return for any new, worsening or concerning symptoms.     SEEK IMMEDIATE MEDICAL CARE IF:  You have a fever.  You develop redness and swelling of your face, jaw, or neck.  You are unable to open your mouth.  You have severe pain uncontrolled by pain medicine.    Emergency Department Resource Guide 1) Find a Doctor and Pay Out of Pocket Although you won't have to find out who is covered by your insurance plan, it is a good idea to ask around and get recommendations. You will then need to call the office and see if the doctor you have chosen will accept you as a new patient and what types of options they offer for patients who are self-pay. Some doctors offer discounts or will set up payment plans for their patients who do not have insurance, but you will need to ask so you aren't surprised when you get to your appointment.  2) Contact Your Local Health Department Not all health departments have doctors that can see patients for sick visits, but many do, so it is worth a call to see if yours does. If you don't know where your local health department is, you can check in your phone book. The CDC also has a tool to help you locate your state's health department, and many state websites also have listings of all of their local health departments.  3) Find a [facility] If your illness is not likely to be very severe or complicated, you may want to try a walk in clinic. These are popping up all over the country in pharmacies, drugstores, and shopping centers. They're usually staffed by nurse practitioners or physician assistants that have been trained to treat common illnesses  and complaints. They're usually fairly quick and inexpensive. However, if you have serious medical issues or chronic medical problems, these are probably not your best option.  No Primary Care Doctor: - Call Health Connect at  [phone] - they can help you locate a primary care doctor that  accepts your insurance, provides certain services, etc. - Physician Referral Service- [phone]  Chronic Pain Problems: Organization         Address  Phone   Notes  [facility]  [phone] Patients need to be referred by their primary care doctor.   Medication Assistance: Organization         Address  Phone   Notes  [facility] Medication [facility] [address] [place], [place] [address] [phone] --Must be a resident of [facility] -- Must have NO insurance coverage whatsoever (no Medicaid/ Medicare, etc.) -- The pt. MUST have a primary care doctor that directs their care regularly and follows them in the community   MedAssist  [phone]   [employer]  [phone]    Agencies that provide inexpensive medical care: Organization         Address  Phone   Notes  [place]  [phone]   [facility]    [phone]   [facility] [address]  [place], [place] [address] [phone]   [place] [address], [place] [phone]   Planned Parenthood    [phone]   [facility]    [phone]   [place] and [place] [address], [place] Phone:  [phone], Fax:  [phone] Hours of Operation:  9 am - 6 pm, M-F.  Also accepts Medicaid/Medicare and self-pay.  [facility] for [place], [address], [place] Phone: [phone], Fax: [phone]. Hours of Operation:  8:30 am - 5:30 pm, M-F.  Also accepts Medicaid and self-pay.  [facility] [address], [place] Phone: [phone]   [place], [place], [place] [phone], Ext. [phone] Mondays & Thursdays: 7-9 AM.  First 15 patients are seen on a first come, first serve basis.    [place] Providers:  Organization         Address  Phone   Notes  [facility] [address], [place] [phone] Also accepts self-pay patients.  [facility] [address], [place]  [phone]   [place], [address], [place] [phone]   [facility] [address], [place] [phone]   Lucianne Lei [address], [place]   [phone] Only accepts [place] Access [place] patients after they have their name applied to their card.   Self-Pay (no insurance) in [facility]:  Organization         Address  Phone   Notes  Sickle Cell Patients, [facility] [place] [phone]   [facility] [place] [phone]   [facility] [place]  [place], [place], [place] [phone]   [facility]/Dr. Osei-Bonsu  [address] [place] or [address], [place] [phone] Phone number for both [place] and [place] locations is the same.  Urgent Medical and [facility] [address] [place] [phone]   [facility] [address] [place] or [address] [place] [address] [phone]   [facility] [address] [place] [phone], phone; [phone], fax Sees patients 1st and 3rd Saturday of every month.  Must not qualify for public or private insurance (i.e. Medicaid, Medicare, [place] Health Choice, Veterans' Benefits)  Household income should be no more than 200% of the poverty level  The clinic cannot treat you if you are pregnant or think you are pregnant  Sexually transmitted diseases are not treated at the clinic.    Dental Care: Organization         Address  Phone  Notes  [facility] Department of [facility] [phone] Accepts children up to age 41 who are enrolled in [place] or [place]; pregnant women with a Medicaid card; and children who have applied for Medicaid or [place] Health Choice, but were declined, whose parents can pay a reduced fee at time of service.  [facility] Department of [facility]  [address], [place] [phone] Accepts children up to age 59 who are enrolled in [place] or [place]  Choice; pregnant women with a Medicaid card; and children who have applied for Medicaid or [place] Health Choice, but were declined, whose parents can pay a reduced fee at time of service.  [place] Adult Dental Access PROGRAM  [place] [phone] Patients are seen by appointment only. Walk-ins are not accepted. [place] will see patients 89 years of age and older. Monday - Tuesday (8am-5pm) Most Wednesdays (8:30-5pm) $30 per visit, cash only  [facility] Adult Dental Access PROGRAM  [address], [facility] [phone] Patients are seen by appointment only. Walk-ins are not accepted. [place] will see patients 33 years of age and older. One Wednesday Evening (Monthly: Volunteer Based).  $30 per visit, cash only  [place]  [phone] for adults; Children under age 80, call Graduate Pediatric Dentistry at [phone]. Children aged 36-14, please call [phone] to request a pediatric application.  Dental services are provided in all areas of dental care including fillings, crowns and bridges, complete and partial dentures, implants, gum treatment, root canals, and extractions. Preventive care is  also provided. Treatment is provided to both adults and children. Patients are selected via a lottery and there is often a waiting list.   [facility] [address], [place]  [phone] [email or web address]   [facility] [address] [place], [place] [phone], Ext. [phone] Second and Fourth Thursday of each month, opens at 6:30 AM; Clinic ends at 9 AM.  Patients are seen on a first-come first-served basis, and a limited number are seen during each clinic.   [facility]  [address] Hillard Danker [place], [place] [phone]   Eligibility Requirements You must have lived in [place], [place], or [place] counties for at least the last three months.   You cannot be eligible for state or federal sponsored [employer], including [employer], [place], or [employer].   You generally cannot be eligible for healthcare insurance through your employer.    How to apply: Eligibility screenings are held every Tuesday and Wednesday afternoon from 1:00 pm until 4:00 pm. You do not need an appointment for the interview!  [facility] [address], [place], [place]   [place]  [place] Department  [place]  [phone]    Behavioral Health Resources in the Community: Intensive Outpatient Programs Organization         Address  Phone  Notes  [place]. [address] [place], [place] [phone]   [facility]patient [address] [place], [place]   ADS: Alcohol & Drug Svcs [address] [place], [place]   [place] [address]  [place], [place] or [phone]   Substance Abuse Resources Organization         Address  Phone  Notes  Alcohol and Drug Services  [phone]   [place]  [phone]   The [place]   Chinita Pester  [phone]   Residential & Outpatient Substance Abuse Program  [phone]   Psychological Services Organization         Address  Phone  Notes  [facility] [place]  [place]  [phone]   [place] [address], [place] or [phone]  Mobile Crisis Teams Organization         Address  Phone  Notes  [facility], Mobile Crisis Care Unit  [phone]   Assertive Psychotherapeutic Services  [address] [place], [place]   [place] [address] [place] [phone]    Self-Help/Support Groups Organization         Address  Phone             Notes  [place]. of [place] - variety of support groups  [place] Call for more information  Narcotics Anonymous (NA), [facility] [address], [employer]   2 meetings at this location   Special educational needs teacher         Address  Phone  Notes  [facility] Residential [facility] [place],    [place]  [phone]   [facility]  [address], [place] [phone], [place], [place]   [place], [place] [phone] Admissions: 8am-3pm M-F  Incentives Substance [place] [address]    [place], [place] [phone]   [facility] [address] [place], [place], [place]   [facility] [address]  [place], [place]   [facility]ight Programs - Intensive Outpatient [address] Kristeen Mans [address], [place], [place]   [facility].,  [place], [place] [phone] or [phone]   Residential Treatment Services (RTS) [address], [place], [place] Accepts Medicaid  [facility] [place] [address]  [place] [phone]  Substance Abuse/Addiction Treatment   [facility] Organization         Address  Phone  Notes  [facility]  [phone]   Domenic Schwab, PhD [address] Arlis Porta [place], [place]   [phone] or [phone]   Vader Becker [place], [place] [phone]   [facility] [address], [place], [place] [phone] Insurance/Medicaid/sponsorship through [facility] and Families [address]                                    [place], [place] [phone] [place] [address], [place] [phone]    Dr. Adele Schilder  [phone]   [facility] Dept. 1) [address], [place] 2) [place] 3)  [place] [address], [place] [phone]  [phone]   [place] [phone] or [phone] (After Hours)

## 2014-04-02 ENCOUNTER — Ambulatory Visit
Admission: RE | Admit: 2014-04-02 | Discharge: 2014-04-02 | Disposition: A | Discharge status: Home / Self Care | Payer: No Typology Code available for payment source | Source: Ambulatory Visit | Attending: Surgery | Admitting: Surgery

## 2014-04-02 ENCOUNTER — Ambulatory Visit (INDEPENDENT_AMBULATORY_CARE_PROVIDER_SITE_OTHER): Payer: Self-pay | Admitting: Surgery

## 2014-04-02 VITALS — BP 150/90 | HR 78 | Resp 20 | Ht 62.0 in | Wt 186.0 lb

## 2014-04-02 DIAGNOSIS — R911 Solitary pulmonary nodule: Secondary | ICD-10-CM

## 2014-04-02 DIAGNOSIS — Z951 Presence of aortocoronary bypass graft: Secondary | ICD-10-CM

## 2014-04-02 DIAGNOSIS — R079 Chest pain, unspecified: Secondary | ICD-10-CM

## 2014-04-02 DIAGNOSIS — G8928 Other chronic postprocedural pain: Secondary | ICD-10-CM

## 2014-04-03 ENCOUNTER — Encounter: Payer: Self-pay | Admitting: Surgery

## 2014-04-03 ENCOUNTER — Other Ambulatory Visit: Payer: Self-pay | Admitting: Family Medicine

## 2014-04-03 NOTE — Progress Notes (Signed)
HPI:  The patient returns today to review her chest CT and make plans for surgery. Her chest wall pain is unchanged. It seems to be elicited by twisting movements of the upper body. The CT scan shows that the sternum is healed.  Current Outpatient Prescriptions  Medication Sig Dispense Refill  . albuterol (PROVENTIL HFA;VENTOLIN HFA) 108 (90 BASE) MCG/ACT inhaler Inhale 2 puffs into the lungs every 6 (six) hours as needed for wheezing. 3 Inhaler 12  . aspirin EC 325 MG tablet Take 325 mg by mouth daily.    Marland Kitchen atorvastatin (LIPITOR) 40 MG tablet Take 1 tablet (40 mg total) by mouth at bedtime. 30 tablet 12  . buPROPion (WELLBUTRIN XL) 150 MG 24 hr tablet Take 1 tablet (150 mg total) by mouth every evening. 30 tablet 0  . Calcium Carbonate-Vitamin D 600-400 MG-UNIT per chew tablet Chew 1 tablet by mouth daily.    . clindamycin (CLEOCIN) 150 MG capsule Take 2 capsules (300 mg total) by mouth 3 (three) times daily. May dispense as 150mg  capsules 60 capsule 0  . clotrimazole (LOTRIMIN) 1 % cream Apply 1 application topically 2 (two) times daily as needed. Fungus    . diazepam (VALIUM) 5 MG tablet Take 5 mg by mouth 2 (two) times daily as needed for anxiety.    . diclofenac sodium (VOLTAREN) 1 % GEL Apply 4 g topically 4 (four) times daily. 1 Tube 3  . HYDROcodone-acetaminophen (NORCO/VICODIN) 5-325 MG per tablet Take 1 tablet by mouth every 4 (four) hours as needed for moderate pain or severe pain. 6 tablet 0  . ibuprofen (ADVIL,MOTRIN) 200 MG tablet Take 200 mg by mouth every 6 (six) hours as needed for moderate pain.    . isosorbide mononitrate (IMDUR) 30 MG 24 hr tablet TAKE 1 TABLET BY MOUTH ONCE DAILY 30 tablet 1  . levothyroxine (SYNTHROID, LEVOTHROID) 112 MCG tablet Take 1 tablet (112 mcg total) by mouth daily. 30 tablet 3  . lidocaine (LIDODERM) 5 % Place 1 patch onto the skin daily as needed (pain). Remove & Discard patch within 12 hours or as directed by MD    . naproxen (NAPROSYN)  500 MG tablet Take 1 tablet (500 mg total) by mouth 2 (two) times daily. 30 tablet 0  . nitroGLYCERIN (NITROSTAT) 0.4 MG SL tablet PLACE ONE TABLET UNDER THE TONGUE EVERY 5 MINUTES AS NEEDED FOR CHEST PAIN 50 tablet 0  . omeprazole (PRILOSEC) 40 MG capsule TAKE 1 CAPSULE BY MOUTH TWICE DAILY 30 capsule 1  . oxyCODONE-acetaminophen (PERCOCET) 7.5-325 MG per tablet Take 1 tablet by mouth every 4 (four) hours as needed for pain.    . Polyethylene Glycol 3350 (MIRALAX PO) Take 17 g by mouth daily as needed. Constipation    . propranolol (INDERAL) 20 MG tablet Take 1 tablet (20 mg total) by mouth 2 (two) times daily as needed (anxiety). 60 tablet 0  . QUEtiapine (SEROQUEL) 400 MG tablet Take 600 mg by mouth at bedtime.    Marland Kitchen QUEtiapine (SEROQUEL) 50 MG tablet Take 50 mg by mouth 2 (two) times daily.     No current facility-administered medications for this visit.     Physical Exam: BP 150/90 mmHg  Pulse 78  Resp 20  Ht 5\' 2"  (1.575 m)  Wt 186 lb (84.369 kg)  BMI 34.01 kg/m2 She looks well The sternotomy scar is fine. There is tenderness to palpation of the incision and along the left anterior chest wall. The sternum feels  stable  Diagnostic Tests:  CLINICAL DATA: Sternal chest pain, history of prior coronary bypass grafting  EXAM: CT CHEST WITHOUT CONTRAST  TECHNIQUE: Multidetector CT imaging of the chest was performed following the standard protocol without IV contrast.  COMPARISON: 07/20/2013  FINDINGS: The lungs are well aerated bilaterally without focal infiltrate or sizable effusion. Minimal lingular scarring is again seen and stable. A tiny 3-4 mm nodule is noted in the left upper lobe best seen on image number 21 of series 4. It is stable from the prior exam as well as 2005 and consistent with a benign etiology.  The hilar and mediastinal structures are within normal limits. Changes consistent with prior coronary bypass grafting are seen. The median sternotomy  shows healing. No significant surrounding inflammatory changes or substernal abnormality is identified no findings to suggest dehiscence or gas/fluid formation is identified.  Scanning into the upper abdomen reveals no acute abnormality. The osseous structures show degenerative change of the thoracic spine.  IMPRESSION: Changes consistent with prior coronary bypass grafting with healing of a median sternotomy. No findings to suggest dehiscence or other acute abnormality are noted.  Stable left lingular scarring.  Stable left upper lobe nodule.   Electronically Signed  By: Inez Catalina M.D.  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 Since the sternum is healed I think it is probably best to remove the sternal wires to see if that resolves her pain. There is no abnormality on CT to account for the pain and I have seen this type of pain completely resolve after wire removal. I discussed the operative procedure with her and her significant other. I discussed the alternative of leaving the wires in. I discussed the benefits and risks including but not limited to bleeding, infection, and failure to resolve her pain. She understands and agrees to proceed. She says that she has a bad tooth that has been causing a lot of pain and she is scheduled to see a dentist this Friday.   Plan:  She will call to schedule removal of the sternal wires under general anesthesia once her tooth problem has resolved.

## 2014-04-07 ENCOUNTER — Other Ambulatory Visit: Payer: Self-pay | Admitting: Internal Medicine

## 2014-04-08 ENCOUNTER — Encounter: Payer: Self-pay | Admitting: Physician Assistant

## 2014-04-08 ENCOUNTER — Other Ambulatory Visit (HOSPITAL_COMMUNITY): Payer: Self-pay | Admitting: Physician Assistant

## 2014-04-08 ENCOUNTER — Ambulatory Visit: Payer: Self-pay | Attending: Family Medicine | Admitting: Family Medicine

## 2014-04-08 ENCOUNTER — Ambulatory Visit (INDEPENDENT_AMBULATORY_CARE_PROVIDER_SITE_OTHER): Payer: Self-pay | Admitting: Physician Assistant

## 2014-04-08 ENCOUNTER — Encounter: Payer: Self-pay | Admitting: Family Medicine

## 2014-04-08 VITALS — BP 142/82 | HR 72 | Temp 98.5°F | Resp 15 | Wt 191.0 lb

## 2014-04-08 VITALS — BP 130/86 | HR 68 | Ht 62.0 in | Wt 190.0 lb

## 2014-04-08 DIAGNOSIS — Z124 Encounter for screening for malignant neoplasm of cervix: Secondary | ICD-10-CM

## 2014-04-08 DIAGNOSIS — R1310 Dysphagia, unspecified: Secondary | ICD-10-CM

## 2014-04-08 DIAGNOSIS — N898 Other specified noninflammatory disorders of vagina: Secondary | ICD-10-CM | POA: Insufficient documentation

## 2014-04-08 DIAGNOSIS — Z90710 Acquired absence of both cervix and uterus: Secondary | ICD-10-CM

## 2014-04-08 DIAGNOSIS — K760 Fatty (change of) liver, not elsewhere classified: Secondary | ICD-10-CM

## 2014-04-08 DIAGNOSIS — Z87891 Personal history of nicotine dependence: Secondary | ICD-10-CM | POA: Insufficient documentation

## 2014-04-08 DIAGNOSIS — R1013 Epigastric pain: Secondary | ICD-10-CM

## 2014-04-08 DIAGNOSIS — L298 Other pruritus: Secondary | ICD-10-CM | POA: Insufficient documentation

## 2014-04-08 DIAGNOSIS — Z114 Encounter for screening for human immunodeficiency virus [HIV]: Secondary | ICD-10-CM

## 2014-04-08 DIAGNOSIS — K219 Gastro-esophageal reflux disease without esophagitis: Secondary | ICD-10-CM

## 2014-04-08 DIAGNOSIS — R1314 Dysphagia, pharyngoesophageal phase: Secondary | ICD-10-CM

## 2014-04-08 MED ORDER — FLUCONAZOLE 150 MG PO TABS: 150.0000 mg | ORAL_TABLET | ORAL | Status: DC

## 2014-04-08 MED ORDER — PANTOPRAZOLE SODIUM 40 MG PO TBEC: 40.0000 mg | DELAYED_RELEASE_TABLET | Freq: Two times a day (BID) | ORAL | Status: DC

## 2014-04-08 NOTE — Patient Instructions (Addendum)
You have been scheduled for an endoscopy. Please follow written instructions given to you at your visit today. If you use inhalers (even only as needed), please bring them with you on the day of your procedure.  You have been scheduled for a Barium Esophogram at [facility] Radiology (1st floor of the hospital) on 04/16/14 at 11:30am. Please arrive 15 minutes prior to your appointment for registration. Make certain not to have anything to eat or drink 6 hours prior to your test. If you need to reschedule for any reason, please contact radiology at [phone] to do so. __________________________________________________________________ A barium swallow is an examination that concentrates on views of the esophagus. This tends to be a double contrast exam (barium and two liquids which, when combined, create a gas to distend the wall of the oesophagus) or single contrast (non-ionic iodine based). The study is usually tailored to your symptoms so a good history is essential. Attention is paid during the study to the form, structure and configuration of the esophagus, looking for functional disorders (such as aspiration, dysphagia, achalasia, motility and reflux) EXAMINATION You may be asked to change into a gown, depending on the type of swallow being performed. A radiologist and radiographer will perform the procedure. The radiologist will advise you of the type of contrast selected for your procedure and direct you during the exam. You will be asked to stand, sit or lie in several different positions and to hold a small amount of fluid in your mouth before being asked to swallow while the imaging is performed .In some instances you may be asked to swallow barium coated marshmallows to assess the motility of a solid food bolus. The exam can be recorded as a digital or video fluoroscopy procedure. POST PROCEDURE It will take 1-2 days for the barium to pass through your system. To facilitate this, it is  important, unless otherwise directed, to increase your fluids for the next 24-48hrs and to resume your normal diet.  This test typically takes about 30 minutes to perform. __________________________________________________________________________________ Lindsay Rivera have been scheduled for a modified barium swallow on 04/16/14 at 11:00am. Please arrive 15 minutes prior to your test for registration. You will go to [facility] (1st Floor) for your appointment. Please refrain from eating or drinking anything 4 hours prior to your test. Should you need to cancel or reschedule your appointment, please contact [phone] [facility]) or [phone] [place]). _____________________________________________________________________ A Modified Barium Swallow Study, or MBS, is a special x-ray that is taken to check swallowing skills. It is carried out by a Stage manager and a Psychologist, clinical (SLP). During this test, yourmouth, throat, and esophagus, a muscular tube which connects your mouth to your stomach, is checked. The test will help you, your doctor, and the SLP plan what types of foods and liquids are easier for you to swallow. The SLP will also identify positions and ways to help you swallow more easily and safely. What will happen during an MBS? You will be taken to an x-ray room and seated comfortably. You will be asked to swallow small amounts of food and liquid mixed with barium. Barium is a liquid or paste that allows images of your mouth, throat and esophagus to be seen on x-ray. The x-ray captures moving images of the food you are swallowing as it travels from your mouth through your throat and into your esophagus. This test helps identify whether food or liquid is entering your lungs (aspiration). The test also shows which  part of your mouth or throat lacks strength or coordination to move the food or liquid in the right direction. This test typically takes 30 minutes to 1 hour to  complete. _______________________________________________________________________  We have sent the following medications to your pharmacy for you to pick up at your convenience: Pantoprazole  Today you have been given anti-reflux measures to read and follow.  Discontinue omeprazole  You will be contacted by the dietician to set up an appointment to discuss diet for fatty liver and diabetes.  I appreciate the opportunity to care for you.

## 2014-04-08 NOTE — Progress Notes (Signed)
   Subjective:    Patient ID: Lindsay Rivera, female    DOB: Jul 05, 1960, 54 y.o.   MRN: [id number] CC: pap only  PCP: Dr. Annitta Needs  HPI 54 yo F with HTN, DM, bipolar depression, hypothyroidism   1. ? Pap: patient had a hysterectomy in 2005. Cervix and uterus removed. Indication was for sterilization, abnormal paps but no cancer.  No vaginal bleeding. Sexually active   2. Vaginal itching: x 4 months. Itching at labia minora. Patient is a lesbian, same partner for may years. No noted vaginal discharge or lesions.   Soc Hx: former smoker, quint 02/2013  Review of Systems As per HPI     Objective:   Physical Exam BP 142/82 mmHg  Pulse 72  Temp(Src) 98.5 F (36.9 C)  Resp 15  Wt 191 lb (86.637 kg)  SpO2 100% General appearance: alert, cooperative and no distress Pelvic: external genitalia normal, no adnexal masses or tenderness, positive findings: vaginal discharge:  white and thin and uterus surgically absent     Assessment & Plan:

## 2014-04-08 NOTE — Assessment & Plan Note (Signed)
A: hysterectomy for non-cancer reason P: no paps needed

## 2014-04-08 NOTE — Assessment & Plan Note (Signed)
Screening HIV done today  

## 2014-04-08 NOTE — Progress Notes (Signed)
Patient here for pap smear only Patient has no other complaints at this time

## 2014-04-08 NOTE — Assessment & Plan Note (Addendum)
A: 3 months of vaginal itching. Normal exam.  P: Diflucan 150 mg PO x 2 doses  Wet prep Likely add vaginal estrogen   Normal wet prep Ordered transdermal estrace

## 2014-04-08 NOTE — Progress Notes (Addendum)
Patient ID: Lindsay Rivera, female   DOB: [birth year], 54 y.o.   MRN: [id number]     History of Present Illness:   Lindsay Rivera is a 54 year old female who was known to Dr. Hilarie Fredrickson having had a screening colonoscopy performed by him. She is here today with complaints of heartburn on a daily basis for the past year.  She had an MI 1 year ago while living in [place] and at that time had a coronary artery bypass graft of 3 vessels. She recently was having sternal chest pain and was seen by cardiothoracic surgery and will be undergoing a procedure to remove her sternotomy wires in the near future. She is also currently in pain management for chronic low back pain.  She reports that she has been getting heartburn on a daily basis she has been using omeprazole but despite this continues to get heartburn. She feels as if the food is getting stuck in her upper esophagus. She gets mouthfuls of fluid coming up at night when she lies down. She has had to spit food up on multiple occasions because she cannot get it to go down. She also feels food and liquids "go down the wrong pipe". She often coughs a lot chokes when eating. She had a [name] in 2000. She has been under a lot of stress as her 72 year old son and 66-year-old grandson were murdered and she feels his stress is exacerbating her reflux. She has no epigastric pain. Her appetite as been good and her weight has been stable.   Past Medical History  Diagnosis Date  . Depression   . Bipolar disorder   . History of hysterectomy   . 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 MI (myocardial infarction) 02/08/2013  . Diabetes mellitus without complication   . Hypertension   . Asthma   . Arthritis   . Anxiety   . GERD (gastroesophageal reflux disease)   . Hyperlipidemia   . Constipation   . CVA (cerebral infarction) 2010  . Hypothyroidism     Past Surgical History  Procedure Laterality Date  . Coronary artery bypass graft  02/10/2013  . Thyroid surgery  85/2014  . Appendectomy   2008  . Abdominal hysterectomy  2004  . Left knee surgery   1994   Family History  Problem Relation Age of Onset  . Diabetes Mother   . Hypertension Mother   . Heart disease Father   . Hypertension Father   . Cancer Father     colon cancer   . Colon cancer Father   . Colon cancer Maternal Grandfather    History  Substance Use Topics  . Smoking status: Former Smoker    Quit date: 02/08/2013  . Smokeless tobacco: Never Used  . Alcohol Use: Yes     Comment: rare glass of wine   Current Outpatient Prescriptions  Medication Sig Dispense Refill  . albuterol (PROVENTIL HFA;VENTOLIN HFA) 108 (90 BASE) MCG/ACT inhaler Inhale 2 puffs into the lungs every 6 (six) hours as needed for wheezing. 3 Inhaler 12  . aspirin EC 325 MG tablet Take 325 mg by mouth daily.    Marland Kitchen atorvastatin (LIPITOR) 40 MG tablet Take 1 tablet (40 mg total) by mouth at bedtime. 30 tablet 12  . buPROPion (WELLBUTRIN XL) 150 MG 24 hr tablet Take 1 tablet (150 mg total) by mouth every evening. 30 tablet 0  . Calcium Carbonate-Vitamin D 600-400 MG-UNIT per chew tablet Chew 1 tablet by mouth daily.    Marland Kitchen  clindamycin (CLEOCIN) 150 MG capsule Take 2 capsules (300 mg total) by mouth 3 (three) times daily. May dispense as 150mg  capsules 60 capsule 0  . clotrimazole (LOTRIMIN) 1 % cream Apply 1 application topically 2 (two) times daily as needed. Fungus    . diazepam (VALIUM) 5 MG tablet Take 5 mg by mouth 2 (two) times daily as needed for anxiety.    . isosorbide mononitrate (IMDUR) 30 MG 24 hr tablet TAKE 1 TABLET BY MOUTH ONCE DAILY 30 tablet 1  . levothyroxine (SYNTHROID, LEVOTHROID) 112 MCG tablet Take 1 tablet (112 mcg total) by mouth daily. 30 tablet 3  . lidocaine (LIDODERM) 5 % Place 1 patch onto the skin daily as needed (pain). Remove & Discard patch within 12 hours or as directed by MD    . nitroGLYCERIN (NITROSTAT) 0.4 MG SL tablet PLACE ONE TABLET UNDER THE TONGUE EVERY 5 MINUTES AS NEEDED FOR CHEST PAIN 50 tablet 0    . omeprazole (PRILOSEC) 20 MG capsule TAKE 2 CAPSULES BY MOUTH DAILY 60 capsule 1  . oxyCODONE-acetaminophen (PERCOCET) 7.5-325 MG per tablet Take 1 tablet by mouth every 4 (four) hours as needed for pain.    . Polyethylene Glycol 3350 (MIRALAX PO) Take 17 g by mouth daily as needed. Constipation    . propranolol (INDERAL) 20 MG tablet Take 1 tablet (20 mg total) by mouth 2 (two) times daily as needed (anxiety). 60 tablet 0  . QUEtiapine (SEROQUEL) 400 MG tablet Take 600 mg by mouth at bedtime.    Marland Kitchen QUEtiapine (SEROQUEL) 50 MG tablet Take 50 mg by mouth 2 (two) times daily.    . fluconazole (DIFLUCAN) 150 MG tablet Take 1 tablet (150 mg total) by mouth every 3 (three) days. 2 tablet 0  . pantoprazole (PROTONIX) 40 MG tablet Take 1 tablet (40 mg total) by mouth 2 (two) times daily. 60 tablet 4   No current facility-administered medications for this visit.   Allergies  Allergen Reactions  . Penicillins Swelling  . Latex Rash      Review of Systems: Gen: Denies any fever, chills, sweats, anorexia, fatigue, weakness, malaise, weight loss, and sleep disorder CV: Denies chest pain, angina, palpitations, syncope, orthopnea, PND, peripheral edema, and claudication. Resp: Denies dyspnea at rest, dyspnea with exercise, cough, sputum, wheezing, coughing up blood, and pleurisy. GI: Denies vomiting blood, jaundice, and fecal incontinence.   Has dysphagia to solids and liquids. GU : Denies urinary burning, blood in urine, urinary frequency, urinary hesitancy, nocturnal urination, and urinary incontinence. MS: Denies joint pain, limitation of movement, and swelling, stiffness, low back pain, extremity pain. Denies muscle weakness, cramps, atrophy.  Derm: Denies rash, itching, dry skin, hives, moles, warts, or unhealing ulcers.  Psych: Denies depression, anxiety, memory loss, suicidal ideation, hallucinations, paranoia, and confusion. Heme: Denies bruising, bleeding, and enlarged lymph nodes. Neuro:   Denies any headaches, dizziness, paresthesia Endo:  Denies any problems with DM, thyroid, adrenal     Studies:  US Abdomen Complete     Study Result     CLINICAL DATA: Dyspepsia, history of diabetes and previous appendectomy  EXAM: ULTRASOUND ABDOMEN COMPLETE  COMPARISON: Abdominal and pelvic CT scan of Jun 11, 2003.  FINDINGS: Gallbladder: No gallstones or wall thickening visualized. No sonographic Murphy sign noted.  Common bile duct: Diameter: 4 mm  Liver: No focal lesion identified. There is no intrahepatic ductal dilation. The hepatic echotexture is mildly increased.  IVC: No abnormality visualized.  Pancreas: Visualized portion unremarkable.  Spleen: Size and  appearance within normal limits.  Right Kidney: Length: 10.6 cm. Echogenicity within normal limits. No mass or hydronephrosis visualized.  Left Kidney: Length: 11.6 cm. Echogenicity within normal limits. No mass or hydronephrosis visualized.  Abdominal aorta: No aneurysm visualized. The bifurcation was obscured by bowel gas.  Other findings: None.  IMPRESSION: Increased hepatic echotexture suggests fatty infiltration. There is no acute intra-abdominal abnormality demonstrated.   Electronically Signed  By: David Martinique  On: 03/07/2014 10:40     Dg Chest 2 View  03/31/2014   CLINICAL DATA:  Chest tightness.  History of prior CABG.  EXAM: CHEST - 2 VIEW  COMPARISON:  07/19/2013  FINDINGS: The heart size and mediastinal contours are stable. Stable appearance of retained epicardial pacing lead. Lungs show mild bibasilar atelectasis. There is no evidence of pulmonary edema, consolidation, pneumothorax, nodule or pleural fluid. The visualized skeletal structures are unremarkable.  IMPRESSION: Mild bibasilar atelectasis.  No active disease.   Electronically Signed   By: Aletta Edouard M.D.   On: 03/31/2014 19:28   Ct Chest Wo Contrast  04/02/2014   CLINICAL DATA:  Sternal chest  pain, history of prior coronary bypass grafting  EXAM: CT CHEST WITHOUT CONTRAST  TECHNIQUE: Multidetector CT imaging of the chest was performed following the standard protocol without IV contrast.  COMPARISON:  07/20/2013  FINDINGS: The lungs are well aerated bilaterally without focal infiltrate or sizable effusion. Minimal lingular scarring is again seen and stable. A tiny 3-4 mm nodule is noted in the left upper lobe best seen on image number 21 of series 4. It is stable from the prior exam as well as 2005 and consistent with a benign etiology.  The hilar and mediastinal structures are within normal limits. Changes consistent with prior coronary bypass grafting are seen. The median sternotomy shows healing. No significant surrounding inflammatory changes or substernal abnormality is identified no findings to suggest dehiscence or gas/fluid formation is identified.  Scanning into the upper abdomen reveals no acute abnormality. The osseous structures show degenerative change of the thoracic spine.  IMPRESSION: Changes consistent with prior coronary bypass grafting with healing of a median sternotomy. No findings to suggest dehiscence or other acute abnormality are noted.  Stable left lingular scarring.  Stable left upper lobe nodule.   Electronically Signed   By: Inez Catalina M.D.   On: 04/02/2014 15:13  Endoscopies: ENDOSCOPIC IMPRESSION: 1. Two sessile polyps ranging between 3-80mm in size were found in the transverse colon and sigmoid colon; Polypectomy was performed using cold snare 2. The colon mucosa was otherwise normal RECOMMENDATIONS: 1. Await biopsy results 2. If the polyps removed today are proven to be adenomatous (pre-cancerous) polyps, you will need a repeat colonoscopy in 5 years. Otherwise you should continue to follow colorectal cancer screening guidelines for "routine risk" patients with colonoscopy in 10 years. You will receive a letter within 1-2 weeks with the results of your biopsy as  well as final recommendations. Please call my office if you have not received a letter after 3 weeks. eSigned: Jerene Bears, MD 10/15/2013 11:06 AM   Physical Exam: General: Pleasant, well developed female in no acute distress Head: Normocephalic and atraumatic Eyes:  sclerae anicteric, conjunctiva pink  Ears: Normal auditory acuity Lungs: Clear throughout to auscultation Heart: Regular rate and rhythm Abdomen: Soft, non distended, non-tender. No masses, no hepatomegaly. Normal bowel sounds Musculoskeletal: Symmetrical with no gross deformities  Extremities: No edema  Neurological: Alert oriented x 4, grossly nonfocal Psychological:  Alert and cooperative. Normal  mood and affect  Assessment and Recommendations: 54 year old female with a one-year history of GERD now experiencing dysphagia to solids and liquids with sensation of coughing and sputtering when eating. A modified barium swallow with speech pathology will be obtained to evaluate if there is any aspiration, and a barium swallow with a tablet will be obtained to evaluate for possible stricture. She will then be scheduled for an EGD to evaluate for esophagitis gastritis ulcer etc.The risks, benefits, and alternatives to endoscopy with possible biopsy and possible dilation were discussed with the patient and they consent to proceed. The procedure will be scheduled with Dr. Hilarie Fredrickson in the meantime she will DC omeprazole. She will be given a trial of pantoprazole 40 mg 30 minutes prior to breakfast and 30 minutes prior to supper. She has been advised to adhere to an antireflux regimen. As her ultrasound showed fatty liver she's been advised to try to lose weight and will be scheduled to see a dietitian to discuss a diet that will assist managing her fatty liver and diabetes. Further recommendations will be made pending the findings of the above testing.    Kamen Hanken, Vita Barley PA-C 04/08/2014,  Addendum: Reviewed and agree with initial  management. Jerene Bears, MD

## 2014-04-08 NOTE — Patient Instructions (Signed)
Ms. Wrede,  Thank you for coming in today.  No pap today due to absences of cervix following hysterectomy.  1. Vaginal itching: two dose of diflucan, 3 days apart.  You will be called with wet prep results Plan to add vaginal estrogen if wet prep negative  Dr. Adrian Blackwater

## 2014-04-09 ENCOUNTER — Other Ambulatory Visit (INDEPENDENT_AMBULATORY_CARE_PROVIDER_SITE_OTHER): Payer: Self-pay

## 2014-04-09 DIAGNOSIS — K760 Fatty (change of) liver, not elsewhere classified: Secondary | ICD-10-CM

## 2014-04-09 DIAGNOSIS — K219 Gastro-esophageal reflux disease without esophagitis: Secondary | ICD-10-CM

## 2014-04-09 DIAGNOSIS — R1013 Epigastric pain: Secondary | ICD-10-CM

## 2014-04-09 DIAGNOSIS — R1310 Dysphagia, unspecified: Secondary | ICD-10-CM

## 2014-04-09 LAB — CERVICOVAGINAL ANCILLARY ONLY
WET PREP (BD AFFIRM): NEGATIVE
WET PREP (BD AFFIRM): NEGATIVE
WET PREP (BD AFFIRM): NEGATIVE

## 2014-04-09 LAB — HEPATIC FUNCTION PANEL
ALBUMIN: 4.2 g/dL (ref 3.5–5.2)
ALT: 18 U/L (ref 0–35)
AST: 17 U/L (ref 0–37)
Alkaline Phosphatase: 117 U/L (ref 39–117)
Bilirubin, Direct: 0.1 mg/dL (ref 0.0–0.3)
TOTAL PROTEIN: 8.1 g/dL (ref 6.0–8.3)
Total Bilirubin: 0.4 mg/dL (ref 0.2–1.2)

## 2014-04-09 LAB — HIV ANTIBODY (ROUTINE TESTING W REFLEX): HIV 1&2 Ab, 4th Generation: NONREACTIVE

## 2014-04-09 MED ORDER — ESTRADIOL 0.1 MG/GM VA CREA: TOPICAL_CREAM | VAGINAL | Status: DC

## 2014-04-09 NOTE — Addendum Note (Signed)
Addended by: Boykin Nearing on: 04/09/2014 09:05 AM   Modules accepted: Orders

## 2014-04-15 ENCOUNTER — Other Ambulatory Visit: Payer: Self-pay | Admitting: Family Medicine

## 2014-04-15 ENCOUNTER — Telehealth: Payer: Self-pay | Admitting: *Deleted

## 2014-04-15 DIAGNOSIS — N898 Other specified noninflammatory disorders of vagina: Secondary | ICD-10-CM

## 2014-04-15 MED ORDER — ESTRADIOL 10 MCG VA TABS: ORAL_TABLET | VAGINAL | Status: DC

## 2014-04-15 NOTE — Telephone Encounter (Signed)
-----   Message from Minerva Ends, MD sent at 04/09/2014  8:58 AM EST ----- Screening HIV negative Wet prep negative- no yeast or BV Plan vaginal estrogen for vaginal itching sent to pharmacy

## 2014-04-15 NOTE — Telephone Encounter (Signed)
Pt aware of results 

## 2014-04-16 ENCOUNTER — Ambulatory Visit (HOSPITAL_COMMUNITY)
Admission: RE | Admit: 2014-04-16 | Discharge: 2014-04-16 | Disposition: A | Discharge status: Home / Self Care | Payer: Self-pay | Source: Ambulatory Visit | Attending: Physician Assistant | Admitting: Physician Assistant

## 2014-04-16 DIAGNOSIS — R1310 Dysphagia, unspecified: Secondary | ICD-10-CM | POA: Insufficient documentation

## 2014-04-16 DIAGNOSIS — K219 Gastro-esophageal reflux disease without esophagitis: Secondary | ICD-10-CM | POA: Insufficient documentation

## 2014-04-16 DIAGNOSIS — R1314 Dysphagia, pharyngoesophageal phase: Secondary | ICD-10-CM

## 2014-04-16 DIAGNOSIS — R1013 Epigastric pain: Secondary | ICD-10-CM

## 2014-04-16 NOTE — Procedures (Signed)
Objective Swallowing Evaluation: Modified Barium Swallowing Study  Patient Details  Name: Lindsay Rivera MRN: [id number] Date of Birth: [birth year]  Today's Date: 04/16/2014 Time: SLP Start Time (ACUTE ONLY): 1100-SLP Stop Time (ACUTE ONLY): 1230 SLP Time Calculation (min) (ACUTE ONLY): 90 min  Past Medical History:  Past Medical History  Diagnosis Date  . Depression   . Bipolar disorder   . History of hysterectomy   . 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 MI (myocardial infarction) 02/08/2013  . Diabetes mellitus without complication   . Hypertension   . Asthma   . Arthritis   . Anxiety   . GERD (gastroesophageal reflux disease)   . Hyperlipidemia   . Constipation   . CVA (cerebral infarction) 2010  . Hypothyroidism    Past Surgical History:  Past Surgical History  Procedure Laterality Date  . Coronary artery bypass graft  02/10/2013  . Thyroid surgery  85/2014  . Appendectomy  2008  . Abdominal hysterectomy  2004  . Left knee surgery   1994   HPI:  HPI: 54 year old female referred for OP MBS as well as an Esophagram, due to complaints of choking and gagging on food and liquidsShe feels as if the food is getting stuck in her upper esophagus. She gets mouthfuls of fluid coming up at night when she lies down. She has had to spit food up on multiple occasions because she cannot get it to go down. She also feels food and liquids "go down the wrong pipe". She often coughs a lot chokes when eating. She had a [name] in 2000. She has been under a lot of stress as her 83 year old son and 31-year-old grandson were murdered and she feels his stress is exacerbating her reflux. She has no epigastric pain. Her appetite as been good and her weight has been stable.  No Data Recorded  Assessment / Plan / Recommendation CHL IP CLINICAL IMPRESSIONS 04/16/2014  Dysphagia Diagnosis WFL  Clinical impression Pt exhibits a normal swallow, with the exception of penetration of thin liquids when swallowing a pill.  The  swallow was triggered in a timely fashion with no aspiration and no oral or pharyngeal residue with any consistency.  Pt is scheduled for an esophagram following this study.      CHL IP TREATMENT RECOMMENDATION 04/16/2014  Treatment Plan Recommendations No treatment recommended at this time     CHL IP DIET RECOMMENDATION 04/16/2014  Diet Recommendations Regular;Thin liquid  Liquid Administration via Cup;Straw  Medication Administration Whole meds with puree  Compensations Slow rate;Small sips/bites;Follow solids with liquid  Postural Changes and/or Swallow Maneuvers Seated upright 90 degrees;Upright 30-60 min after meal     CHL IP OTHER RECOMMENDATIONS 04/16/2014  Recommended Consults (None)  Oral Care Recommendations Oral care BID  Other Recommendations Clarify dietary restrictions     CHL IP FOLLOW UP RECOMMENDATIONS 04/16/2014  Follow up Recommendations None     No flowsheet data found.   Pertinent Vitals/Pain n/a    SLP Swallow Goals No flowsheet data found.  No flowsheet data found.    CHL IP REASON FOR REFERRAL 04/16/2014  Reason for Referral Objectively evaluate swallowing function     CHL IP ORAL PHASE 04/16/2014  Lips (None)  Tongue (None)  Mucous membranes (None)  Nutritional status (None)  Other (None)  Oxygen therapy (None)  Oral Phase WFL  Oral - Pudding Teaspoon (None)  Oral - Pudding Cup (None)  Oral - Honey Teaspoon (None)  Oral - Honey Cup (None)  Oral - Honey Syringe (None)  Oral - Nectar Teaspoon (None)  Oral - Nectar Cup (None)  Oral - Nectar Straw (None)  Oral - Nectar Syringe (None)  Oral - Ice Chips (None)  Oral - Thin Teaspoon (None)  Oral - Thin Cup (None)  Oral - Thin Straw (None)  Oral - Thin Syringe (None)  Oral - Puree (None)  Oral - Mechanical Soft (None)  Oral - Regular (None)  Oral - Multi-consistency (None)  Oral - Pill (None)  Oral Phase - Comment (None)      CHL IP PHARYNGEAL PHASE 04/16/2014  Pharyngeal Phase WFL  Pharyngeal -  Pudding Teaspoon (None)  Penetration/Aspiration details (pudding teaspoon) (None)  Pharyngeal - Pudding Cup (None)  Penetration/Aspiration details (pudding cup) (None)  Pharyngeal - Honey Teaspoon (None)  Penetration/Aspiration details (honey teaspoon) (None)  Pharyngeal - Honey Cup (None)  Penetration/Aspiration details (honey cup) (None)  Pharyngeal - Honey Syringe (None)  Penetration/Aspiration details (honey syringe) (None)  Pharyngeal - Nectar Teaspoon (None)  Penetration/Aspiration details (nectar teaspoon) (None)  Pharyngeal - Nectar Cup (None)  Penetration/Aspiration details (nectar cup) (None)  Pharyngeal - Nectar Straw (None)  Penetration/Aspiration details (nectar straw) (None)  Pharyngeal - Nectar Syringe (None)  Penetration/Aspiration details (nectar syringe) (None)  Pharyngeal - Ice Chips (None)  Penetration/Aspiration details (ice chips) (None)  Pharyngeal - Thin Teaspoon (None)  Penetration/Aspiration details (thin teaspoon) (None)  Pharyngeal - Thin Cup Penetration/Aspiration during swallow  Penetration/Aspiration details (thin cup) Material enters airway, remains ABOVE vocal cords and not ejected out  Pharyngeal - Thin Straw (None)  Penetration/Aspiration details (thin straw) (None)  Pharyngeal - Thin Syringe (None)  Penetration/Aspiration details (thin syringe') (None)  Pharyngeal - Puree (None)  Penetration/Aspiration details (puree) (None)  Pharyngeal - Mechanical Soft (None)  Penetration/Aspiration details (mechanical soft) (None)  Pharyngeal - Regular (None)  Penetration/Aspiration details (regular) (None)  Pharyngeal - Multi-consistency (None)  Penetration/Aspiration details (multi-consistency) (None)  Pharyngeal - Pill WFL  Penetration/Aspiration details (pill) (None)  Pharyngeal Comment (None)     CHL IP CERVICAL ESOPHAGEAL PHASE 04/16/2014  Cervical Esophageal Phase WFL  Pudding Teaspoon (None)  Pudding Cup (None)  Honey Teaspoon (None)  Honey Cup  (None)  Honey Syringe (None)  Nectar Teaspoon (None)  Nectar Cup (None)  Nectar Straw (None)  Nectar Syringe (None)  Thin Teaspoon (None)  Thin Cup (None)  Thin Straw (None)  Thin Syringe (None)  Cervical Esophageal Comment (None)    CHL IP GO 04/16/2014  Functional Assessment Tool Used Clinical judgement  Functional Limitations Swallowing  Swallow Current Status (E2683) CI  Swallow Goal Status (M1962) CI  Swallow Discharge Status (I2979) CI  Motor Speech Current Status (G9211) (None)  Motor Speech Goal Status (H4174) (None)  Motor Speech Goal Status (Y8144) (None)  Spoken Language Comprehension Current Status (Y1856) (None)  Spoken Language Comprehension Goal Status (D1497) (None)  Spoken Language Comprehension Discharge Status (W2637) (None)  Spoken Language Expression Current Status (C5885) (None)  Spoken Language Expression Goal Status (O2774) (None)  Spoken Language Expression Discharge Status (J2878) (None)  Attention Current Status (M7672) (None)  Attention Goal Status (C9470) (None)  Attention Discharge Status (J6283) (None)  Memory Current Status (M6294) (None)  Memory Goal Status (T6546) (None)  Memory Discharge Status (T0354) (None)  Voice Current Status (S5681) (None)  Voice Goal Status (E7517) (None)  Voice Discharge Status (G0174) (None)  Other Speech-Language Pathology Functional Limitation (B4496) (None)  Other Speech-Language Pathology Functional Limitation Goal Status (P5916) (None)  Other Speech-Language Pathology Functional Limitation Discharge Status (  L9767) (None)           Quinn Axe T 04/16/2014, 12:55 PM

## 2014-04-21 ENCOUNTER — Telehealth: Payer: Self-pay

## 2014-04-21 NOTE — Telephone Encounter (Signed)
Returned patient phone call Patient stated she will need to call me back Not able to talk at this time

## 2014-04-23 ENCOUNTER — Other Ambulatory Visit: Payer: Self-pay

## 2014-04-29 ENCOUNTER — Telehealth: Payer: Self-pay | Admitting: Family Medicine

## 2014-04-29 ENCOUNTER — Telehealth: Payer: Self-pay

## 2014-04-29 DIAGNOSIS — N898 Other specified noninflammatory disorders of vagina: Secondary | ICD-10-CM

## 2014-04-29 MED ORDER — ESTRADIOL 10 MCG VA TABS: 1.0000 | ORAL_TABLET | Freq: Every day | VAGINAL | Status: DC

## 2014-04-29 NOTE — Telephone Encounter (Signed)
Please inform patient. vagifem sent in.

## 2014-04-29 NOTE — Telephone Encounter (Signed)
Patient called requesting a prescription for  Functional capacity evaluation Is this something we do ?

## 2014-04-30 ENCOUNTER — Ambulatory Visit (AMBULATORY_SURGERY_CENTER): Payer: Self-pay | Admitting: Internal Medicine

## 2014-04-30 ENCOUNTER — Encounter: Payer: Self-pay | Admitting: Internal Medicine

## 2014-04-30 VITALS — BP 107/76 | HR 63 | Temp 96.9°F | Resp 12 | Ht 62.0 in | Wt 190.0 lb

## 2014-04-30 DIAGNOSIS — K219 Gastro-esophageal reflux disease without esophagitis: Secondary | ICD-10-CM

## 2014-04-30 DIAGNOSIS — R1314 Dysphagia, pharyngoesophageal phase: Secondary | ICD-10-CM

## 2014-04-30 MED ORDER — SODIUM CHLORIDE 0.9 % IV SOLN: 500.0000 mL | INTRAVENOUS | Status: DC

## 2014-04-30 NOTE — Progress Notes (Signed)
Called to room to assist during endoscopic procedure.  Patient ID and intended procedure confirmed with present staff. Received instructions for my participation in the procedure from the performing physician.  

## 2014-04-30 NOTE — Op Note (Signed)
[facility]  [address]. [place], [address]   ENDOSCOPY PROCEDURE REPORT  PATIENT: Lindsay Rivera, Lindsay Rivera  MR#: [id number] BIRTHDATE: 11-Apr-1960 , 85  yrs. old GENDER: female ENDOSCOPIST: Jerene Bears, MD REFERRED BY:  Lorayne Marek, MD PROCEDURE DATE:  04/30/2014 PROCEDURE:  EGD w/ wire guided (Savary) dilation ASA CLASS:     Class III INDICATIONS:  history of reflux esophagitis and dysphagia. MEDICATIONS: Monitored anesthesia care and Propofol 200 mg IV TOPICAL ANESTHETIC: none  DESCRIPTION OF PROCEDURE: After the risks benefits and alternatives of the procedure were thoroughly explained, informed consent was obtained.  The LB [id number] endoscope was introduced through the mouth and advanced to the second portion of the duodenum , Without limitations.  The instrument was slowly withdrawn as the mucosa was fully examined.  ESOPHAGUS: The mucosa of the esophagus appeared normal.  Given pill dysphagia, the esophagus was empirically dilated using a 20mm (51Fr) Savary dilator over guidewire.  Post dilation views obtained revealing no evidence of complication.  STOMACH: A small hiatal hernia was noted.   The mucosa of the stomach appeared normal.  DUODENUM: The duodenal mucosa showed no abnormalities in the bulb and 2nd part of the duodenum.  Retroflexed views revealed a hiatal hernia.     The scope was then withdrawn from the patient and the procedure completed.  COMPLICATIONS: There were no immediate complications.  ENDOSCOPIC IMPRESSION: 1.   The mucosa of the esophagus appeared normal; empiric dilation given symptoms, 96mm (51Fr) Savary dilator over guidewire 2.   Small hiatal hernia 3.   The mucosa of the stomach appeared normal 4.   The duodenal mucosa showed no abnormalities in the bulb and 2nd part of the duodenum  RECOMMENDATIONS: 1.  Anti-reflux regimen to be follow 2.  Continue taking your PPI (antiacid medicine) once daily.  It is best  to be taken 20-30 minutes prior to breakfast meal. 3.  Follow-up is advised on an as needed basis.  eSigned:  Jerene Bears, MD 04/30/2014 10:49 AM  CC: the patient, Dr. Annitta Needs

## 2014-04-30 NOTE — Patient Instructions (Signed)
YOU HAD AN ENDOSCOPIC PROCEDURE TODAY AT [facility]:   Refer to the procedure report that was given to you for any specific questions about what was found during the examination.  If the procedure report does not answer your questions, please call your gastroenterologist to clarify.  If you requested that your care partner not be given the details of your procedure findings, then the procedure report has been included in a sealed envelope for you to review at your convenience later.  YOU SHOULD EXPECT: Some feelings of bloating in the abdomen. Passage of more gas than usual.  Walking can help get rid of the air that was put into your GI tract during the procedure and reduce the bloating. If you had a lower endoscopy (such as a colonoscopy or flexible sigmoidoscopy) you may notice spotting of blood in your stool or on the toilet paper. If you underwent a bowel prep for your procedure, you may not have a normal bowel movement for a few days.  Please Note:  You might notice some irritation and congestion in your nose or some drainage.  This is from the oxygen used during your procedure.  There is no need for concern and it should clear up in a day or so.  SYMPTOMS TO REPORT IMMEDIATELY:   Following upper endoscopy (EGD)  Vomiting of blood or coffee ground material  New chest pain or pain under the shoulder blades  Painful or persistently difficult swallowing  New shortness of breath  Fever of 100F or higher  Black, tarry-looking stools  For urgent or emergent issues, a gastroenterologist can be reached at any hour by calling [phone].   DIET: Your first meal following the procedure should be a small meal and then it is ok to progress to your normal diet. Heavy or fried foods are harder to digest and may make you feel nauseous or bloated.  Likewise, meals heavy in dairy and vegetables can increase bloating.  Drink plenty of fluids but you should avoid alcoholic beverages for  24 hours.  ACTIVITY:  You should plan to take it easy for the rest of today and you should NOT DRIVE or use heavy machinery until tomorrow (because of the sedation medicines used during the test).    FOLLOW UP: Our staff will call the number listed on your records the next business day following your procedure to check on you and address any questions or concerns that you may have regarding the information given to you following your procedure. If we do not reach you, we will leave a message.  However, if you are feeling well and you are not experiencing any problems, there is no need to return our call.  We will assume that you have returned to your regular daily activities without incident.  If any biopsies were taken you will be contacted by phone or by letter within the next 1-3 weeks.  Please call us at [phone] if you have not heard about the biopsies in 3 weeks.    SIGNATURES/CONFIDENTIALITY: You and/or your care partner have signed paperwork which will be entered into your electronic medical record.  These signatures attest to the fact that that the information above on your After Visit Summary has been reviewed and is understood.  Full responsibility of the confidentiality of this discharge information lies with you and/or your care-partner.  Recommendations Discharge instructions provided to patient and/or care partner. Dilation diet information covered and copy given to patient.

## 2014-04-30 NOTE — Progress Notes (Signed)
A/ox3 pleased with MAC, report to Wendy RN 

## 2014-05-01 ENCOUNTER — Telehealth: Payer: Self-pay | Admitting: *Deleted

## 2014-05-01 NOTE — Telephone Encounter (Signed)
  Follow up Call-  Call back number 04/30/2014 10/15/2013  Post procedure Call Back phone  # [phone]  Permission to leave phone message Yes Yes     No answer at # given.  Left message on voicemail.

## 2014-05-02 ENCOUNTER — Ambulatory Visit: Payer: Self-pay

## 2014-05-19 ENCOUNTER — Ambulatory Visit: Payer: Self-pay | Admitting: Skilled Nursing Facility1

## 2014-05-22 ENCOUNTER — Ambulatory Visit: Payer: Self-pay | Admitting: Skilled Nursing Facility1

## 2014-06-09 ENCOUNTER — Other Ambulatory Visit: Payer: Self-pay | Admitting: Family Medicine

## 2014-06-10 ENCOUNTER — Other Ambulatory Visit: Payer: Self-pay | Admitting: Internal Medicine

## 2014-06-11 ENCOUNTER — Other Ambulatory Visit: Payer: Self-pay | Admitting: Family Medicine

## 2014-06-11 ENCOUNTER — Other Ambulatory Visit: Payer: Self-pay | Admitting: *Deleted

## 2014-06-11 DIAGNOSIS — N898 Other specified noninflammatory disorders of vagina: Secondary | ICD-10-CM

## 2014-06-11 NOTE — Telephone Encounter (Signed)
Patient asking for refill on her Vagifem.  She states she is having hot flashes that are keeping her up at night.

## 2014-06-12 ENCOUNTER — Telehealth: Payer: Self-pay

## 2014-06-12 MED ORDER — ESTRADIOL 10 MCG VA TABS: 1.0000 | ORAL_TABLET | VAGINAL | Status: DC

## 2014-06-12 NOTE — Telephone Encounter (Signed)
Returned patient phone call Patient not available Left message on voice mail to return our call 

## 2014-07-10 ENCOUNTER — Ambulatory Visit: Payer: Self-pay | Admitting: Physical Medicine & Rehabilitation

## 2014-07-21 ENCOUNTER — Telehealth: Payer: Self-pay | Admitting: Internal Medicine

## 2014-07-21 ENCOUNTER — Ambulatory Visit: Payer: Self-pay | Attending: Internal Medicine

## 2014-07-21 NOTE — Telephone Encounter (Signed)
Patient has come in today to schedule an appointment for a physical and has also requested that she have orders placed to have her Thyroid levels checked; please f/u with patient about request

## 2014-07-31 ENCOUNTER — Encounter: Payer: Self-pay | Admitting: Internal Medicine

## 2014-08-05 ENCOUNTER — Other Ambulatory Visit: Payer: Self-pay

## 2014-08-05 ENCOUNTER — Other Ambulatory Visit: Payer: Self-pay | Admitting: Internal Medicine

## 2014-08-05 MED ORDER — LEVOTHYROXINE SODIUM 112 MCG PO TABS: 112.0000 ug | ORAL_TABLET | Freq: Every day | ORAL | Status: DC

## 2014-08-20 ENCOUNTER — Other Ambulatory Visit: Payer: Self-pay | Admitting: Internal Medicine

## 2014-08-20 ENCOUNTER — Telehealth: Payer: Self-pay

## 2014-08-20 ENCOUNTER — Other Ambulatory Visit: Payer: Self-pay

## 2014-08-20 MED ORDER — ISOSORBIDE MONONITRATE ER 30 MG PO TB24: 30.0000 mg | ORAL_TABLET | Freq: Every day | ORAL | Status: DC

## 2014-08-20 NOTE — Telephone Encounter (Signed)
Patient called requesting a refill on her [facility]ide [facility]

## 2014-08-22 ENCOUNTER — Ambulatory Visit: Payer: Self-pay | Attending: Internal Medicine | Admitting: *Deleted

## 2014-08-22 VITALS — BP 170/110 | HR 75 | Temp 97.9°F | Resp 16 | Wt 177.4 lb

## 2014-08-22 DIAGNOSIS — R030 Elevated blood-pressure reading, without diagnosis of hypertension: Secondary | ICD-10-CM | POA: Insufficient documentation

## 2014-08-22 DIAGNOSIS — IMO0001 Reserved for inherently not codable concepts without codable children: Secondary | ICD-10-CM

## 2014-08-22 MED ORDER — CLONIDINE HCL 0.1 MG PO TABS
0.1000 mg | ORAL_TABLET | Freq: Once | ORAL | Status: AC
Start: 2014-08-22 — End: 2014-08-22
  Administered 2014-08-22: 0.1 mg via ORAL

## 2014-08-22 MED ORDER — AMLODIPINE BESYLATE 5 MG PO TABS: 5.0000 mg | ORAL_TABLET | Freq: Every day | ORAL | Status: DC

## 2014-08-22 NOTE — Progress Notes (Signed)
Patient walked in from [place] stating BP 170/118 Patient is "aggravated" Not wanting to obtain weight or review med list Has taken inderal 20 mg and imdur 30 mg 24 hr tab today States following low sodium diet and walking 10 minutes daily for exercise Positive for headaches, blurred vision, SHOB (using albuterol inhaler) Denies Chest pain C/o swelling in both hands and both feet. Hands > feet  Filed Vitals:   08/22/14 1650  BP: 156/104  Pulse: 75  Temp: 97.9 F (36.6 C)  Resp: 16    Per Covering Provider: 0.1 mg clonidine now Add norvasc 5 mg daily  Patient will f/u with PCP in 2 weeks  Patient given literature on hypertension  BP recheck 170/110 left arm manually with adult cuff P 75 SpO2 100% Patient became angry when second RN entered room and threatened to walk out without having BP rechecked. Patient instructed by this RN to lie down in darkened room when she gets home and try to relax Patient has home BP monitoring device. Patient instructed on most accurate way to take BP, to keep BP log with date and time and bring log to all future visits. Patient instructed to go to ED if BP does not decrease. Patient states understanding. Covering Provider made aware

## 2014-08-22 NOTE — Patient Instructions (Signed)

## 2014-09-10 ENCOUNTER — Ambulatory Visit: Payer: Self-pay | Attending: Internal Medicine | Admitting: Internal Medicine

## 2014-09-10 ENCOUNTER — Encounter: Payer: Self-pay | Admitting: Internal Medicine

## 2014-09-10 VITALS — BP 107/73 | HR 69 | Temp 98.0°F | Resp 16 | Wt 177.2 lb

## 2014-09-10 DIAGNOSIS — Z7982 Long term (current) use of aspirin: Secondary | ICD-10-CM | POA: Insufficient documentation

## 2014-09-10 DIAGNOSIS — I2510 Atherosclerotic heart disease of native coronary artery without angina pectoris: Secondary | ICD-10-CM | POA: Insufficient documentation

## 2014-09-10 DIAGNOSIS — R0981 Nasal congestion: Secondary | ICD-10-CM | POA: Insufficient documentation

## 2014-09-10 DIAGNOSIS — E039 Hypothyroidism, unspecified: Secondary | ICD-10-CM | POA: Insufficient documentation

## 2014-09-10 DIAGNOSIS — E785 Hyperlipidemia, unspecified: Secondary | ICD-10-CM | POA: Insufficient documentation

## 2014-09-10 DIAGNOSIS — E119 Type 2 diabetes mellitus without complications: Secondary | ICD-10-CM | POA: Insufficient documentation

## 2014-09-10 DIAGNOSIS — F419 Anxiety disorder, unspecified: Secondary | ICD-10-CM | POA: Insufficient documentation

## 2014-09-10 DIAGNOSIS — Z79899 Other long term (current) drug therapy: Secondary | ICD-10-CM | POA: Insufficient documentation

## 2014-09-10 DIAGNOSIS — F17210 Nicotine dependence, cigarettes, uncomplicated: Secondary | ICD-10-CM | POA: Insufficient documentation

## 2014-09-10 DIAGNOSIS — Z720 Tobacco use: Secondary | ICD-10-CM

## 2014-09-10 DIAGNOSIS — I10 Essential (primary) hypertension: Secondary | ICD-10-CM | POA: Insufficient documentation

## 2014-09-10 DIAGNOSIS — K219 Gastro-esophageal reflux disease without esophagitis: Secondary | ICD-10-CM | POA: Insufficient documentation

## 2014-09-10 DIAGNOSIS — F17200 Nicotine dependence, unspecified, uncomplicated: Secondary | ICD-10-CM

## 2014-09-10 DIAGNOSIS — F329 Major depressive disorder, single episode, unspecified: Secondary | ICD-10-CM | POA: Insufficient documentation

## 2014-09-10 LAB — LIPID PANEL
CHOL/HDL RATIO: 4.6 ratio (ref <=5.0)
CHOLESTEROL: 179 mg/dL (ref 125–200)
HDL: 39 mg/dL — ABNORMAL LOW (ref >=46)
LDL CALC: 109 mg/dL (ref <130)
TRIGLYCERIDES: 153 mg/dL — AB (ref <150)
VLDL: 31 mg/dL — ABNORMAL HIGH (ref <30)

## 2014-09-10 LAB — COMPLETE METABOLIC PANEL WITH GFR
ALT: 10 U/L (ref 6–29)
AST: 12 U/L (ref 10–35)
Albumin: 3.6 g/dL (ref 3.6–5.1)
Alkaline Phosphatase: 104 U/L (ref 33–130)
BUN: 11 mg/dL (ref 7–25)
CALCIUM: 9.4 mg/dL (ref 8.6–10.4)
CHLORIDE: 105 mmol/L (ref 98–110)
CO2: 25 mmol/L (ref 20–31)
Creat: 0.92 mg/dL (ref 0.50–1.05)
GFR, EST AFRICAN AMERICAN: 82 mL/min (ref >=60)
GFR, Est Non African American: 71 mL/min (ref >=60)
Glucose, Bld: 98 mg/dL (ref 65–99)
Potassium: 3.5 mmol/L (ref 3.5–5.3)
SODIUM: 141 mmol/L (ref 135–146)
TOTAL PROTEIN: 7.1 g/dL (ref 6.1–8.1)
Total Bilirubin: 0.5 mg/dL (ref 0.2–1.2)

## 2014-09-10 LAB — TSH: TSH: 9.114 u[IU]/mL — AB (ref 0.350–4.500)

## 2014-09-10 MED ORDER — FLUTICASONE PROPIONATE 50 MCG/ACT NA SUSP: 2.0000 | Freq: Every day | NASAL | Status: DC

## 2014-09-10 NOTE — Progress Notes (Signed)
Patient here for routine follow up Patient recently prescribed medications for HTN through [facility] Patient also states her pro air inhaler does not seem to be helping her

## 2014-09-10 NOTE — Progress Notes (Signed)
MRN: [id number] Name: Lindsay Rivera  Sex: female Age: 54 y.o. DOB: [birth year]  Allergies: Penicillins and Latex  Chief Complaint  Patient presents with  . Follow-up    HPI: Patient is 54 y.o. female who has history of hyperlipidemia, CAD, hypothyroidism, bipolar disorder currently patient following up with [facility], on the last visit her levothyroxine dose was increased, patient complaining of some nasal congestion and shortness of breath, patient has been using her albuterol as needed denies any fever chills chest pain, patient follows up with her cardiologist as well, recently have blood pressure was uncontrolled, Norvasc was added, her blood pressure is improved. Patient still some smoke cigarettes, I have counseled patient to quit smoking  Past Medical History  Diagnosis Date  . Depression   . Bipolar disorder   . History of hysterectomy   . 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 MI (myocardial infarction) 02/08/2013  . Diabetes mellitus without complication   . Hypertension   . Asthma   . Arthritis   . Anxiety   . GERD (gastroesophageal reflux disease)   . Hyperlipidemia   . Constipation   . CVA (cerebral infarction) 2010  . Hypothyroidism   . Anemia   . CHF (congestive heart failure)   . Stroke     Past Surgical History  Procedure Laterality Date  . Coronary artery bypass graft  02/10/2013  . Thyroid surgery  85/2014  . Appendectomy  2008  . Abdominal hysterectomy  2004  . Left knee surgery   1994      Medication List       This list is accurate as of: 09/10/14  3:46 PM.  Always use your most recent med list.               albuterol 108 (90 BASE) MCG/ACT inhaler  Commonly known as:  PROVENTIL HFA;VENTOLIN HFA  Inhale 2 puffs into the lungs every 6 (six) hours as needed for wheezing.     amLODipine 5 MG tablet  Commonly known as:  NORVASC  Take 1 tablet (5 mg total) by mouth daily.     aspirin EC 325 MG tablet  Take 325 mg by mouth daily.     atorvastatin 40 MG  tablet  Commonly known as:  LIPITOR  Take 1 tablet (40 mg total) by mouth at bedtime.     buPROPion 150 MG 24 hr tablet  Commonly known as:  WELLBUTRIN XL  Take 1 tablet (150 mg total) by mouth every evening.     Calcium Carbonate-Vitamin D 600-400 MG-UNIT per chew tablet  Chew 1 tablet by mouth daily.     clindamycin 150 MG capsule  Commonly known as:  CLEOCIN  Take 2 capsules (300 mg total) by mouth 3 (three) times daily. May dispense as 150mg  capsules     clotrimazole 1 % cream  Commonly known as:  LOTRIMIN  Apply 1 application topically 2 (two) times daily as needed. Fungus     diazepam 5 MG tablet  Commonly known as:  VALIUM  Take 5 mg by mouth 2 (two) times daily as needed for anxiety.     Estradiol 10 MCG Tabs vaginal tablet  Commonly known as:  VAGIFEM  Place 1 tablet (10 mcg total) vaginally 2 (two) times a week. For 2 weeks     fluconazole 150 MG tablet  Commonly known as:  DIFLUCAN  Take 1 tablet (150 mg total) by mouth every 3 (three) days.     fluticasone  50 MCG/ACT nasal spray  Commonly known as:  FLONASE  Place 2 sprays into both nostrils daily.     isosorbide mononitrate 30 MG 24 hr tablet  Commonly known as:  IMDUR  Take 1 tablet (30 mg total) by mouth daily.     levothyroxine 112 MCG tablet  Commonly known as:  SYNTHROID, LEVOTHROID  Take 1 tablet (112 mcg total) by mouth daily.     lidocaine 5 %  Commonly known as:  LIDODERM  Place 1 patch onto the skin daily as needed (pain). Remove & Discard patch within 12 hours or as directed by MD     MIRALAX PO  Take 17 g by mouth daily as needed. Constipation     nitroGLYCERIN 0.4 MG SL tablet  Commonly known as:  NITROSTAT  PLACE ONE TABLET UNDER THE TONGUE EVERY 5 MINUTES AS NEEDED FOR CHEST PAIN     omeprazole 20 MG capsule  Commonly known as:  PRILOSEC  TAKE 2 CAPSULES BY MOUTH DAILY     oxyCODONE-acetaminophen 7.5-325 MG per tablet  Commonly known as:  PERCOCET  Take 1 tablet by mouth every 4  (four) hours as needed for pain.     pantoprazole 40 MG tablet  Commonly known as:  PROTONIX  Take 1 tablet (40 mg total) by mouth 2 (two) times daily.     propranolol 20 MG tablet  Commonly known as:  INDERAL  Take 1 tablet (20 mg total) by mouth 2 (two) times daily as needed (anxiety).     QUEtiapine 400 MG tablet  Commonly known as:  SEROQUEL  Take 600 mg by mouth at bedtime.     QUEtiapine 50 MG tablet  Commonly known as:  SEROQUEL  Take 50 mg by mouth 2 (two) times daily.        Meds ordered this encounter  Medications  . fluticasone (FLONASE) 50 MCG/ACT nasal spray    Sig: Place 2 sprays into both nostrils daily.    Dispense:  16 g    Refill:  3    Immunization History  Administered Date(s) Administered  . Influenza,inj,Quad PF,36+ Mos 01/20/2014  . Pneumococcal Polysaccharide-23 07/21/2013    Family History  Problem Relation Age of Onset  . Diabetes Mother   . Hypertension Mother   . Stomach cancer Mother   . Heart disease Father   . Hypertension Father   . Cancer Father     colon cancer   . Colon cancer Father   . Colon cancer Maternal Grandfather     History  Substance Use Topics  . Smoking status: Former Smoker    Quit date: 02/08/2013  . Smokeless tobacco: Never Used  . Alcohol Use: 0.0 oz/week    0 Standard drinks or equivalent per week     Comment: rare glass of wine    Review of Systems   As noted in HPI  Filed Vitals:   09/10/14 1423  BP: 107/73  Pulse: 69  Temp: 98 F (36.7 C)  Resp: 16    Physical Exam  Physical Exam  HENT:  Some nasal congestion no sinus tenderness  Eyes: EOM are normal. Pupils are equal, round, and reactive to light.  Cardiovascular: Normal rate and regular rhythm.   Pulmonary/Chest: Breath sounds normal. No respiratory distress. She has no wheezes. She has no rales.  Musculoskeletal: She exhibits no edema.    Labs   Lab Results  Component Value Date   WBC 8.8 03/31/2014   HGB 12.8 03/31/2014  HCT 40.2 03/31/2014   PLT 379 03/31/2014   GLUCOSE 111* 03/31/2014   CHOL 196 11/12/2013   TRIG 182* 11/12/2013   HDL 44 11/12/2013   LDLCALC 116* 11/12/2013   ALT 18 04/09/2014   AST 17 04/09/2014   NA 142 03/31/2014   K 3.7 03/31/2014   CL 107 03/31/2014   CREATININE 0.79 03/31/2014   BUN 13 03/31/2014   CO2 26 03/31/2014   TSH 6.901* 03/05/2014    No results found for: HGBA1C   Assessment and Plan  Essential hypertension - Plan: blood pressure is well-controlled continued current meds COMPLETE METABOLIC PANEL WITH GFR  Coronary artery disease involving native coronary artery of native heart without angina pectoris - Plan: COMPLETE METABOLIC PANEL WITH GFR, we'll check Lipid panel  Hypothyroidism, unspecified hypothyroidism type - Plan: currently patient is on levothyroxine 112 mcg daily, recheck TSH level  Nasal congestion - Plan: fluticasone (FLONASE) 50 MCG/ACT nasal spray  Smoking Counseled patient  to quit smoking  Gastroesophageal reflux disease, esophagitis presence not specified Lifestyle modification, continue with Prilosec   Return in about 3 months (around 12/11/2014), or if symptoms worsen or fail to improve.   This note has been created with Surveyor, quantity. Any transcriptional errors are unintentional.    Lorayne Marek, MD

## 2014-09-10 NOTE — Patient Instructions (Signed)
Smoking Cessation Quitting smoking is important to your health and has many advantages. However, it is not always easy to quit since nicotine is a very addictive drug. Oftentimes, people try 3 times or more before being able to quit. This document explains the best ways for you to prepare to quit smoking. Quitting takes hard work and a lot of effort, but you can do it. ADVANTAGES OF QUITTING SMOKING  You will live longer, feel better, and live better.  Your body will feel the impact of quitting smoking almost immediately.  Within 20 minutes, blood pressure decreases. Your pulse returns to its normal level.  After 8 hours, carbon monoxide levels in the blood return to normal. Your oxygen level increases.  After 24 hours, the chance of having a heart attack starts to decrease. Your breath, hair, and body stop smelling like smoke.  After 48 hours, damaged nerve endings begin to recover. Your sense of taste and smell improve.  After 72 hours, the body is virtually free of nicotine. Your bronchial tubes relax and breathing becomes easier.  After 2 to 12 weeks, lungs can hold more air. Exercise becomes easier and circulation improves.  The risk of having a heart attack, stroke, cancer, or lung disease is greatly reduced.  After 1 year, the risk of coronary heart disease is cut in half.  After 5 years, the risk of stroke falls to the same as a nonsmoker.  After 10 years, the risk of lung cancer is cut in half and the risk of other cancers decreases significantly.  After 15 years, the risk of coronary heart disease drops, usually to the level of a nonsmoker.  If you are pregnant, quitting smoking will improve your chances of having a healthy baby.  The people you live with, especially any children, will be healthier.  You will have extra money to spend on things other than cigarettes. QUESTIONS TO THINK ABOUT BEFORE ATTEMPTING TO QUIT You may want to talk about your answers with your  health care provider.  Why do you want to quit?  If you tried to quit in the past, what helped and what did not?  What will be the most difficult situations for you after you quit? How will you plan to handle them?  Who can help you through the tough times? Your family? Friends? A health care provider?  What pleasures do you get from smoking? What ways can you still get pleasure if you quit? Here are some questions to ask your health care provider:  How can you help me to be successful at quitting?  What medicine do you think would be best for me and how should I take it?  What should I do if I need more help?  What is smoking withdrawal like? How can I get information on withdrawal? GET READY  Set a quit date.  Change your environment by getting rid of all cigarettes, ashtrays, matches, and lighters in your home, car, or work. Do not let people smoke in your home.  Review your past attempts to quit. Think about what worked and what did not. GET SUPPORT AND ENCOURAGEMENT You have a better chance of being successful if you have help. You can get support in many ways.  Tell your family, friends, and coworkers that you are going to quit and need their support. Ask them not to smoke around you.  Get individual, group, or telephone counseling and support. Programs are available at local hospitals and health centers. Call   your local health department for information about programs in your area.  Spiritual beliefs and practices may help some smokers quit.  Download a "quit meter" on your computer to keep track of quit statistics, such as how long you have gone without smoking, cigarettes not smoked, and money saved.  Get a self-help book about quitting smoking and staying off tobacco. LEARN NEW SKILLS AND BEHAVIORS  Distract yourself from urges to smoke. Talk to someone, go for a walk, or occupy your time with a task.  Change your normal routine. Take a different route to work.  Drink tea instead of coffee. Eat breakfast in a different place.  Reduce your stress. Take a hot bath, exercise, or read a book.  Plan something enjoyable to do every day. Reward yourself for not smoking.  Explore interactive web-based programs that specialize in helping you quit. GET MEDICINE AND USE IT CORRECTLY Medicines can help you stop smoking and decrease the urge to smoke. Combining medicine with the above behavioral methods and support can greatly increase your chances of successfully quitting smoking.  Nicotine replacement therapy helps deliver nicotine to your body without the negative effects and risks of smoking. Nicotine replacement therapy includes nicotine gum, lozenges, inhalers, nasal sprays, and skin patches. Some may be available over-the-counter and others require a prescription.  Antidepressant medicine helps people abstain from smoking, but how this works is unknown. This medicine is available by prescription.  Nicotinic receptor partial agonist medicine simulates the effect of nicotine in your brain. This medicine is available by prescription. Ask your health care provider for advice about which medicines to use and how to use them based on your health history. Your health care provider will tell you what side effects to look out for if you choose to be on a medicine or therapy. Carefully read the information on the package. Do not use any other product containing nicotine while using a nicotine replacement product.  RELAPSE OR DIFFICULT SITUATIONS Most relapses occur within the first 3 months after quitting. Do not be discouraged if you start smoking again. Remember, most people try several times before finally quitting. You may have symptoms of withdrawal because your body is used to nicotine. You may crave cigarettes, be irritable, feel very hungry, cough often, get headaches, or have difficulty concentrating. The withdrawal symptoms are only temporary. They are strongest  when you first quit, but they will go away within 10-14 days. To reduce the chances of relapse, try to:  Avoid drinking alcohol. Drinking lowers your chances of successfully quitting.  Reduce the amount of caffeine you consume. Once you quit smoking, the amount of caffeine in your body increases and can give you symptoms, such as a rapid heartbeat, sweating, and anxiety.  Avoid smokers because they can make you want to smoke.  Do not let weight gain distract you. Many smokers will gain weight when they quit, usually less than 10 pounds. Eat a healthy diet and stay active. You can always lose the weight gained after you quit.  Find ways to improve your mood other than smoking. FOR MORE INFORMATION  www.smokefree.gov  Document Released: 01/18/2001 Document Revised: 06/10/2013 Document Reviewed: 05/05/2011 ExitCare Patient Information 2015 [employer]. This information is not intended to replace advice given to you by your health care provider. Make sure you discuss any questions you have with your health care provider. DASH Eating Plan DASH stands for "Dietary Approaches to Stop Hypertension." The DASH eating plan is a healthy eating plan that has   been shown to reduce high blood pressure (hypertension). Additional health benefits may include reducing the risk of type 2 diabetes mellitus, heart disease, and stroke. The DASH eating plan may also help with weight loss. WHAT DO I NEED TO KNOW ABOUT THE DASH EATING PLAN? For the DASH eating plan, you will follow these general guidelines:  Choose foods with a percent daily value for sodium of less than 5% (as listed on the food label).  Use salt-free seasonings or herbs instead of table salt or sea salt.  Check with your health care provider or pharmacist before using salt substitutes.  Eat lower-sodium products, often labeled as "lower sodium" or "no salt added."  Eat fresh foods.  Eat more vegetables, fruits, and low-fat dairy  products.  Choose whole grains. Look for the word "whole" as the first word in the ingredient list.  Choose fish and skinless chicken or turkey more often than red meat. Limit fish, poultry, and meat to 6 oz (170 g) each day.  Limit sweets, desserts, sugars, and sugary drinks.  Choose heart-healthy fats.  Limit cheese to 1 oz (28 g) per day.  Eat more home-cooked food and less restaurant, buffet, and fast food.  Limit fried foods.  Cook foods using methods other than frying.  Limit canned vegetables. If you do use them, rinse them well to decrease the sodium.  When eating at a restaurant, ask that your food be prepared with less salt, or no salt if possible. WHAT FOODS CAN I EAT? Seek help from a dietitian for individual calorie needs. Grains Whole grain or whole wheat bread. Brown rice. Whole grain or whole wheat pasta. Quinoa, bulgur, and whole grain cereals. Low-sodium cereals. Corn or whole wheat flour tortillas. Whole grain cornbread. Whole grain crackers. Low-sodium crackers. Vegetables Fresh or frozen vegetables (raw, steamed, roasted, or grilled). Low-sodium or reduced-sodium tomato and vegetable juices. Low-sodium or reduced-sodium tomato sauce and paste. Low-sodium or reduced-sodium canned vegetables.  Fruits All fresh, canned (in natural juice), or frozen fruits. Meat and Other Protein Products Ground beef (85% or leaner), grass-fed beef, or beef trimmed of fat. Skinless chicken or turkey. Ground chicken or turkey. Pork trimmed of fat. All fish and seafood. Eggs. Dried beans, peas, or lentils. Unsalted nuts and seeds. Unsalted canned beans. Dairy Low-fat dairy products, such as skim or 1% milk, 2% or reduced-fat cheeses, low-fat ricotta or cottage cheese, or plain low-fat yogurt. Low-sodium or reduced-sodium cheeses. Fats and Oils Tub margarines without trans fats. Light or reduced-fat mayonnaise and salad dressings (reduced sodium). Avocado. Safflower, olive, or canola  oils. Natural peanut or almond butter. Other Unsalted popcorn and pretzels. The items listed above may not be a complete list of recommended foods or beverages. Contact your dietitian for more options. WHAT FOODS ARE NOT RECOMMENDED? Grains White bread. White pasta. White rice. Refined cornbread. Bagels and croissants. Crackers that contain trans fat. Vegetables Creamed or fried vegetables. Vegetables in a cheese sauce. Regular canned vegetables. Regular canned tomato sauce and paste. Regular tomato and vegetable juices. Fruits Dried fruits. Canned fruit in light or heavy syrup. Fruit juice. Meat and Other Protein Products Fatty cuts of meat. Ribs, chicken wings, bacon, sausage, bologna, salami, chitterlings, fatback, hot dogs, bratwurst, and packaged luncheon meats. Salted nuts and seeds. Canned beans with salt. Dairy Whole or 2% milk, cream, half-and-half, and cream cheese. Whole-fat or sweetened yogurt. Full-fat cheeses or blue cheese. Nondairy creamers and whipped toppings. Processed cheese, cheese spreads, or cheese curds. Condiments Onion and garlic salt,   seasoned salt, table salt, and sea salt. Canned and packaged gravies. Worcestershire sauce. Tartar sauce. Barbecue sauce. Teriyaki sauce. Soy sauce, including reduced sodium. Steak sauce. Fish sauce. Oyster sauce. Cocktail sauce. Horseradish. Ketchup and mustard. Meat flavorings and tenderizers. Bouillon cubes. Hot sauce. Tabasco sauce. Marinades. Taco seasonings. Relishes. Fats and Oils Butter, stick margarine, lard, shortening, ghee, and bacon fat. Coconut, palm kernel, or palm oils. Regular salad dressings. Other Pickles and olives. Salted popcorn and pretzels. The items listed above may not be a complete list of foods and beverages to avoid. Contact your dietitian for more information. WHERE CAN I FIND MORE INFORMATION? National Heart, Lung, and Blood Institute: www.nhlbi.nih.gov/health/health-topics/topics/dash/ Document Released:  01/13/2011 Document Revised: 06/10/2013 Document Reviewed: 11/28/2012 ExitCare Patient Information 2015 [employer]. This information is not intended to replace advice given to you by your health care provider. Make sure you discuss any questions you have with your health care provider.  

## 2014-09-11 ENCOUNTER — Telehealth: Payer: Self-pay

## 2014-09-11 MED ORDER — LEVOTHYROXINE SODIUM 125 MCG PO TABS: 125.0000 ug | ORAL_TABLET | Freq: Every day | ORAL | Status: DC

## 2014-09-11 NOTE — Telephone Encounter (Signed)
-----   Message from Lorayne Marek, MD sent at 09/11/2014  9:34 AM EDT ----- Call and let the patient know that her cholesterol level is improving, still has borderline elevated triglycerides, advise patient for a low-fat diet.  TSH level is still abnormal, patient is taking levothyroxine 112 mcg daily, advised patient to increase the dose to 125 mcg daily, repeat her TSH level on the following visit.

## 2014-09-11 NOTE — Telephone Encounter (Signed)
Patient is aware of her lab results [facility]

## 2014-10-08 ENCOUNTER — Other Ambulatory Visit: Payer: Self-pay | Admitting: Physician Assistant

## 2014-12-09 ENCOUNTER — Other Ambulatory Visit: Payer: Self-pay | Admitting: Internal Medicine

## 2014-12-09 ENCOUNTER — Other Ambulatory Visit: Payer: Self-pay | Admitting: Family Medicine

## 2014-12-16 ENCOUNTER — Other Ambulatory Visit: Payer: Self-pay | Admitting: Internal Medicine

## 2014-12-22 ENCOUNTER — Other Ambulatory Visit: Payer: Self-pay | Admitting: *Deleted

## 2014-12-22 ENCOUNTER — Other Ambulatory Visit: Payer: Self-pay

## 2014-12-22 MED ORDER — ALBUTEROL SULFATE HFA 108 (90 BASE) MCG/ACT IN AERS: 2.0000 | INHALATION_SPRAY | Freq: Four times a day (QID) | RESPIRATORY_TRACT | Status: DC | PRN

## 2015-01-12 ENCOUNTER — Ambulatory Visit: Payer: Self-pay | Attending: Family Medicine

## 2015-01-20 ENCOUNTER — Ambulatory Visit: Payer: Self-pay | Attending: Internal Medicine | Admitting: Internal Medicine

## 2015-01-20 ENCOUNTER — Encounter: Payer: Self-pay | Admitting: Internal Medicine

## 2015-01-20 ENCOUNTER — Other Ambulatory Visit: Payer: Self-pay

## 2015-01-20 VITALS — BP 121/86 | HR 64 | Temp 98.4°F | Resp 18 | Ht 62.0 in | Wt 175.0 lb

## 2015-01-20 DIAGNOSIS — Z90710 Acquired absence of both cervix and uterus: Secondary | ICD-10-CM | POA: Insufficient documentation

## 2015-01-20 DIAGNOSIS — I10 Essential (primary) hypertension: Secondary | ICD-10-CM | POA: Insufficient documentation

## 2015-01-20 DIAGNOSIS — Z91040 Latex allergy status: Secondary | ICD-10-CM | POA: Insufficient documentation

## 2015-01-20 DIAGNOSIS — F319 Bipolar disorder, unspecified: Secondary | ICD-10-CM | POA: Insufficient documentation

## 2015-01-20 DIAGNOSIS — Z87891 Personal history of nicotine dependence: Secondary | ICD-10-CM | POA: Insufficient documentation

## 2015-01-20 DIAGNOSIS — R9431 Abnormal electrocardiogram [ECG] [EKG]: Secondary | ICD-10-CM | POA: Insufficient documentation

## 2015-01-20 DIAGNOSIS — E079 Disorder of thyroid, unspecified: Secondary | ICD-10-CM | POA: Insufficient documentation

## 2015-01-20 DIAGNOSIS — K219 Gastro-esophageal reflux disease without esophagitis: Secondary | ICD-10-CM | POA: Insufficient documentation

## 2015-01-20 DIAGNOSIS — E119 Type 2 diabetes mellitus without complications: Secondary | ICD-10-CM | POA: Insufficient documentation

## 2015-01-20 DIAGNOSIS — I509 Heart failure, unspecified: Secondary | ICD-10-CM | POA: Insufficient documentation

## 2015-01-20 DIAGNOSIS — E039 Hypothyroidism, unspecified: Secondary | ICD-10-CM | POA: Insufficient documentation

## 2015-01-20 DIAGNOSIS — Z79899 Other long term (current) drug therapy: Secondary | ICD-10-CM | POA: Insufficient documentation

## 2015-01-20 DIAGNOSIS — J45909 Unspecified asthma, uncomplicated: Secondary | ICD-10-CM | POA: Insufficient documentation

## 2015-01-20 DIAGNOSIS — E785 Hyperlipidemia, unspecified: Secondary | ICD-10-CM | POA: Insufficient documentation

## 2015-01-20 DIAGNOSIS — Z951 Presence of aortocoronary bypass graft: Secondary | ICD-10-CM | POA: Insufficient documentation

## 2015-01-20 DIAGNOSIS — R7303 Prediabetes: Secondary | ICD-10-CM | POA: Insufficient documentation

## 2015-01-20 DIAGNOSIS — Z880 Allergy status to penicillin: Secondary | ICD-10-CM | POA: Insufficient documentation

## 2015-01-20 DIAGNOSIS — Z7982 Long term (current) use of aspirin: Secondary | ICD-10-CM | POA: Insufficient documentation

## 2015-01-20 DIAGNOSIS — J4520 Mild intermittent asthma, uncomplicated: Secondary | ICD-10-CM | POA: Insufficient documentation

## 2015-01-20 DIAGNOSIS — Z8673 Personal history of transient ischemic attack (TIA), and cerebral infarction without residual deficits: Secondary | ICD-10-CM | POA: Insufficient documentation

## 2015-01-20 DIAGNOSIS — R05 Cough: Secondary | ICD-10-CM | POA: Insufficient documentation

## 2015-01-20 DIAGNOSIS — F419 Anxiety disorder, unspecified: Secondary | ICD-10-CM | POA: Insufficient documentation

## 2015-01-20 DIAGNOSIS — I252 Old myocardial infarction: Secondary | ICD-10-CM | POA: Insufficient documentation

## 2015-01-20 DIAGNOSIS — R079 Chest pain, unspecified: Secondary | ICD-10-CM | POA: Insufficient documentation

## 2015-01-20 DIAGNOSIS — Z0000 Encounter for general adult medical examination without abnormal findings: Secondary | ICD-10-CM | POA: Insufficient documentation

## 2015-01-20 LAB — POCT GLYCOSYLATED HEMOGLOBIN (HGB A1C): HEMOGLOBIN A1C: 6.3

## 2015-01-20 LAB — TSH: TSH: 0.236 u[IU]/mL — ABNORMAL LOW (ref 0.350–4.500)

## 2015-01-20 LAB — GLUCOSE, POCT (MANUAL RESULT ENTRY): POC Glucose: 90 mg/dl (ref 70–99)

## 2015-01-20 NOTE — Patient Instructions (Signed)
Make your appointment with Dr. Verl Blalock today!!!!!  If you experience this chest pain again please go to ER asap!!!!!

## 2015-01-20 NOTE — Progress Notes (Signed)
Patient ID: Lindsay Rivera, female   DOB: 1960-11-08, 54 y.o.   MRN: [id number]  CC: SOB, chest pain  HPI: Lindsay Rivera is a 54 y.o. female here today for a follow up visit.  Patient has past medical history of MI, HTN, CHF, CVA, HLD, bipolar disorder, CABG, and asthma. Patient reports that she has been having more SOB with exertion or bending over. She reports using her Albuterol inhaler as needed. She reports waking up at least twice per week with coughing in the middle of the night. She is a former smoker with a 1 year prior quit date.  Has been experiencing some chest pain. When she has this pain she takes a nitrostat or sit down and the pain goes away after one dose. Pain radiates to her left arm. She states that her last chest pain episode was 3 days ago and she denies any pain today. Pain usually last less than 5 minutes each time. She has been evaluated by Dr. Verl Blalock in the past for chest pain. Her last visit with Cardiology was in February 2016. Uses a walker and fell 8 days ago. She states that she had a CVA in 2010 and hit her head. Was not dizzy or weak. She feel forward. Did not go to ER.  She reports at the time of the fall she was not using her walker.  Patient is requesting a refill of her Estradiol due to vaginal dryness.   Allergies  Allergen Reactions  . Penicillins Swelling  . Latex Rash   Past Medical History  Diagnosis Date  . Depression   . Bipolar disorder   . History of hysterectomy   . 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 MI (myocardial infarction) 02/08/2013  . Diabetes mellitus without complication   . Hypertension   . Asthma   . Arthritis   . Anxiety   . GERD (gastroesophageal reflux disease)   . Hyperlipidemia   . Constipation   . CVA (cerebral infarction) 2010  . Hypothyroidism   . Anemia   . CHF (congestive heart failure)   . Stroke    Current Outpatient Prescriptions on File Prior to Visit  Medication Sig Dispense Refill  . albuterol (PROVENTIL HFA;VENTOLIN HFA)  108 (90 BASE) MCG/ACT inhaler Inhale 2 puffs into the lungs every 6 (six) hours as needed for wheezing. 3 Inhaler 2  . amLODipine (NORVASC) 5 MG tablet TAKE 1 TABLET BY MOUTH DAILY. 30 tablet 1  . aspirin EC 325 MG tablet Take 325 mg by mouth daily.    Marland Kitchen atorvastatin (LIPITOR) 40 MG tablet Take 1 tablet (40 mg total) by mouth at bedtime. 30 tablet 12  . buPROPion (WELLBUTRIN XL) 150 MG 24 hr tablet Take 1 tablet (150 mg total) by mouth every evening. 30 tablet 0  . Calcium Carbonate-Vitamin D 600-400 MG-UNIT per chew tablet Chew 1 tablet by mouth daily.    . clindamycin (CLEOCIN) 150 MG capsule Take 2 capsules (300 mg total) by mouth 3 (three) times daily. May dispense as 150mg  capsules 60 capsule 0  . clotrimazole (LOTRIMIN) 1 % cream Apply 1 application topically 2 (two) times daily as needed. Fungus    . diazepam (VALIUM) 5 MG tablet Take 5 mg by mouth 2 (two) times daily as needed for anxiety.    . Estradiol (VAGIFEM) 10 MCG TABS vaginal tablet Place 1 tablet (10 mcg total) vaginally 2 (two) times a week. For 2 weeks 30 tablet 1  . fluconazole (DIFLUCAN) 150 MG  tablet Take 1 tablet (150 mg total) by mouth every 3 (three) days. 2 tablet 0  . fluticasone (FLONASE) 50 MCG/ACT nasal spray Place 2 sprays into both nostrils daily. 16 g 3  . isosorbide mononitrate (IMDUR) 30 MG 24 hr tablet TAKE 1 TABLET BY MOUTH DAILY. 30 tablet 1  . levothyroxine (SYNTHROID, LEVOTHROID) 112 MCG tablet Take 1 tablet (112 mcg total) by mouth daily. 30 tablet 3  . levothyroxine (SYNTHROID, LEVOTHROID) 125 MCG tablet Take 1 tablet (125 mcg total) by mouth daily. 90 tablet 3  . lidocaine (LIDODERM) 5 % Place 1 patch onto the skin daily as needed (pain). Remove & Discard patch within 12 hours or as directed by MD    . nitroGLYCERIN (NITROSTAT) 0.4 MG SL tablet PLACE ONE TABLET UNDER THE TONGUE EVERY 5 MINUTES AS NEEDED FOR CHEST PAIN 50 tablet 0  . omeprazole (PRILOSEC) 20 MG capsule TAKE 2 CAPSULES BY MOUTH DAILY 60  capsule 1  . oxyCODONE-acetaminophen (PERCOCET) 7.5-325 MG per tablet Take 1 tablet by mouth every 4 (four) hours as needed for pain.    . pantoprazole (PROTONIX) 40 MG tablet Take 1 tablet by mouth twice daily 60 tablet 4  . Polyethylene Glycol 3350 (MIRALAX PO) Take 17 g by mouth daily as needed. Constipation    . propranolol (INDERAL) 20 MG tablet Take 1 tablet (20 mg total) by mouth 2 (two) times daily as needed (anxiety). 60 tablet 0  . QUEtiapine (SEROQUEL) 400 MG tablet Take 600 mg by mouth at bedtime.    Marland Kitchen QUEtiapine (SEROQUEL) 50 MG tablet Take 50 mg by mouth 2 (two) times daily.     No current facility-administered medications on file prior to visit.   Family History  Problem Relation Age of Onset  . Diabetes Mother   . Hypertension Mother   . Stomach cancer Mother   . Heart disease Father   . Hypertension Father   . Cancer Father     colon cancer   . Colon cancer Father   . Colon cancer Maternal Grandfather    Social History   Social History  . Marital Status: Divorced    Spouse Name: N/A  . Number of Children: N/A  . Years of Education: N/A   Occupational History  . Not on file.   Social History Main Topics  . Smoking status: Former Smoker    Quit date: 02/08/2013  . Smokeless tobacco: Never Used  . Alcohol Use: 0.0 oz/week    0 Standard drinks or equivalent per week     Comment: rare glass of wine  . Drug Use: No  . Sexual Activity: Not on file   Other Topics Concern  . Not on file   Social History Narrative   Current smoker    Review of Systems: Other than what is stated in HPI, all other systems are negative.     Objective:   Filed Vitals:   01/20/15 1555  BP: 121/86  Pulse: 64  Temp: 98.4 F (36.9 C)  Resp: 18    Physical Exam  Constitutional: She is oriented to person, place, and time.  Cardiovascular: Normal rate, regular rhythm and normal heart sounds.   No murmur heard. Pulmonary/Chest: Effort normal and breath sounds normal.   Musculoskeletal: She exhibits no edema.  Neurological: She is alert and oriented to person, place, and time.  Skin: Skin is warm and dry.  Psychiatric: She has a normal mood and affect.     Lab Results  Component  Value Date   WBC 8.8 03/31/2014   HGB 12.8 03/31/2014   HCT 40.2 03/31/2014   MCV 86.5 03/31/2014   PLT 379 03/31/2014   Lab Results  Component Value Date   CREATININE 0.92 09/10/2014   BUN 11 09/10/2014   NA 141 09/10/2014   K 3.5 09/10/2014   CL 105 09/10/2014   CO2 25 09/10/2014    Lab Results  Component Value Date   HGBA1C 6.30 01/20/2015   Lipid Panel     Component Value Date/Time   CHOL 179 09/10/2014 1448   TRIG 153* 09/10/2014 1448   HDL 39* 09/10/2014 1448   CHOLHDL 4.6 09/10/2014 1448   VLDL 31* 09/10/2014 1448   LDLCALC 109 09/10/2014 1448       Assessment and plan:   Lindsay Rivera was seen today for shortness of breath.  Diagnoses and all orders for this visit:  Chest pain, unspecified chest pain type -     Troponin I There are changes to patients EKG. Some T wave inversion. Patient is not currently experiencing chest pain at this minute or after 6 minute walk test. This is not a acute issue and has been present for over 1 month. I will get Troponin on her today and call if abnormal with directions to go to the ER. Case discussed with Dr. Adrian Blackwater. I have strongly encouraged patient to seek cardiology appointment ASAP for further evaluation and go to ER if she has chest pain again.  Asthma, mild intermittent, uncomplicated She will use her albuterol before bed to see if this helps. If this does not improve then I will add a steroid inhaler based on symptoms.  Prediabetes -     POCT A1C -     Microalbumin/Creatinine Ratio, Urine -     Glucose (CBG) Prediabetic. I have discussed lifestyle changes with patient such as diet, exercise, and weight management to slow onset of diabetes.   Thyroid disease -     TSH I will recheck levels today and  make changes as needed.   Vaginal dryness I have explained that I will not refill her estradiol due to the possibility of cardiac side effects. I have advised her to try [place] to help with dryness and irritation.   Healthcare maintenance -     Flu Vaccine QUAD 36+ mos PF IM (Fluarix & Fluzone Quad PF)  Return in about 3 months (around 04/20/2015).       Lance Bosch, [facility] [phone] 01/20/2015, 3:54 PM

## 2015-01-20 NOTE — Progress Notes (Signed)
Patient here for SOB and vaginal concerns.  Patient complains of SOB being present for 2 months with sharps pains which come and go.  Patient would like her flu shot today.

## 2015-01-21 LAB — MICROALBUMIN / CREATININE URINE RATIO
CREATININE, URINE: 183 mg/dL (ref 20–320)
Microalb Creat Ratio: 6 mcg/mg creat (ref <30)
Microalb, Ur: 1.1 mg/dL

## 2015-01-21 LAB — TROPONIN I

## 2015-01-23 ENCOUNTER — Telehealth: Payer: Self-pay

## 2015-01-23 NOTE — Telephone Encounter (Signed)
Pt returned call. Please follow up.

## 2015-01-23 NOTE — Telephone Encounter (Signed)
Nurse called patient, reached automated message stating "I'm sorry but the person you are calling does not have a voicemail mailbox set up yet. Goodbye".

## 2015-01-23 NOTE — Telephone Encounter (Signed)
-----   Message from Lance Bosch, NP sent at 01/23/2015  2:02 PM EST ----- Please call patient and find out which dose of Synthroid she is on? Two are listed in the chart. I will decrease her dose and send to pharmacy once I have your response because her levels are low

## 2015-01-23 NOTE — Telephone Encounter (Signed)
Pt. Returned call. Please f/u with pt. °

## 2015-01-26 NOTE — Telephone Encounter (Signed)
-----   Message from Lance Bosch, NP sent at 01/23/2015  2:02 PM EST ----- Please call patient and find out which dose of Synthroid she is on? Two are listed in the chart. I will decrease her dose and send to pharmacy once I have your response because her levels are low

## 2015-01-26 NOTE — Telephone Encounter (Signed)
Nurse called patient, patient verified date of birth. Patient is currently taking synthroid 137mcg daily. Patient aware of lower dose being sent to pharmacy due to low levels.  Nurse will send message to provider.

## 2015-01-27 NOTE — Telephone Encounter (Signed)
Patient called nurse, patient verified date of birth. Patient calling to check on status of decreased dose of Synthroid being sent to pharmacy. Nurse will send this message to [facility].

## 2015-01-28 ENCOUNTER — Other Ambulatory Visit: Payer: Self-pay | Admitting: Internal Medicine

## 2015-01-28 ENCOUNTER — Telehealth: Payer: Self-pay

## 2015-01-28 DIAGNOSIS — E079 Disorder of thyroid, unspecified: Secondary | ICD-10-CM

## 2015-01-28 MED ORDER — LEVOTHYROXINE SODIUM 112 MCG PO TABS: 112.0000 ug | ORAL_TABLET | Freq: Every day | ORAL | Status: DC

## 2015-01-28 NOTE — Telephone Encounter (Signed)
Synthroid 112 mcg have been sent to our pharmacy. She needs a repeat TSH in 8 weeks. Please schedule and place future order

## 2015-01-28 NOTE — Telephone Encounter (Signed)
Returned call to patient Patient is aware a new prescription for [facility]

## 2015-03-18 ENCOUNTER — Other Ambulatory Visit: Payer: Self-pay | Admitting: Family Medicine

## 2015-03-18 ENCOUNTER — Other Ambulatory Visit: Payer: Self-pay | Admitting: Cardiology

## 2015-03-18 MED FILL — ?AMLODIPINE BESYLATE 5 MG T: 5 | 30 days supply | Qty: 30 | Fill #2

## 2015-03-18 MED FILL — ?PANTOPRAZOLE SOD DR 40MG: 40 MG | 30 days supply | Qty: 60 | Fill #3

## 2015-03-18 MED FILL — ISOSORBIDE MN ER 30 MG TAB: 30 | 30 days supply | Qty: 30 | Fill #0

## 2015-03-18 MED FILL — HYDROXYZINE PAM 25 MG CAP: 25 | 30 days supply | Qty: 60 | Fill #2

## 2015-03-18 MED FILL — ?FLUOXETINE HCL 20MG TABLET: 20 | 30 days supply | Qty: 30 | Fill #0

## 2015-03-20 ENCOUNTER — Other Ambulatory Visit: Payer: Self-pay | Admitting: Cardiology

## 2015-03-20 MED FILL — ?BUPROPION HCL SR 150 MG TA: 150 | 30 days supply | Qty: 60 | Fill #0

## 2015-03-20 MED FILL — PROPRANOLOL 20 MG TABLET: 20 | 30 days supply | Qty: 60 | Fill #0

## 2015-03-23 MED FILL — ?LEVOTHYROXINE 112 MCG TAB: 112 | 30 days supply | Qty: 30 | Fill #0

## 2015-03-25 ENCOUNTER — Ambulatory Visit: Payer: Self-pay | Attending: Critical Care Medicine | Admitting: Critical Care Medicine

## 2015-03-25 ENCOUNTER — Encounter: Payer: Self-pay | Admitting: Critical Care Medicine

## 2015-03-25 VITALS — BP 136/92 | HR 59 | Temp 97.9°F | Resp 20 | Ht 62.0 in | Wt 178.2 lb

## 2015-03-25 DIAGNOSIS — I252 Old myocardial infarction: Secondary | ICD-10-CM | POA: Insufficient documentation

## 2015-03-25 DIAGNOSIS — Z90710 Acquired absence of both cervix and uterus: Secondary | ICD-10-CM | POA: Insufficient documentation

## 2015-03-25 DIAGNOSIS — Z951 Presence of aortocoronary bypass graft: Secondary | ICD-10-CM | POA: Insufficient documentation

## 2015-03-25 DIAGNOSIS — Z7982 Long term (current) use of aspirin: Secondary | ICD-10-CM | POA: Insufficient documentation

## 2015-03-25 DIAGNOSIS — I509 Heart failure, unspecified: Secondary | ICD-10-CM | POA: Insufficient documentation

## 2015-03-25 DIAGNOSIS — I25118 Atherosclerotic heart disease of native coronary artery with other forms of angina pectoris: Secondary | ICD-10-CM

## 2015-03-25 DIAGNOSIS — M1990 Unspecified osteoarthritis, unspecified site: Secondary | ICD-10-CM | POA: Insufficient documentation

## 2015-03-25 DIAGNOSIS — K219 Gastro-esophageal reflux disease without esophagitis: Secondary | ICD-10-CM | POA: Insufficient documentation

## 2015-03-25 DIAGNOSIS — E039 Hypothyroidism, unspecified: Secondary | ICD-10-CM | POA: Insufficient documentation

## 2015-03-25 DIAGNOSIS — J45909 Unspecified asthma, uncomplicated: Secondary | ICD-10-CM | POA: Insufficient documentation

## 2015-03-25 DIAGNOSIS — R0600 Dyspnea, unspecified: Secondary | ICD-10-CM

## 2015-03-25 DIAGNOSIS — I209 Angina pectoris, unspecified: Secondary | ICD-10-CM

## 2015-03-25 DIAGNOSIS — Z79899 Other long term (current) drug therapy: Secondary | ICD-10-CM | POA: Insufficient documentation

## 2015-03-25 DIAGNOSIS — I2510 Atherosclerotic heart disease of native coronary artery without angina pectoris: Secondary | ICD-10-CM | POA: Insufficient documentation

## 2015-03-25 DIAGNOSIS — R42 Dizziness and giddiness: Secondary | ICD-10-CM | POA: Insufficient documentation

## 2015-03-25 DIAGNOSIS — F319 Bipolar disorder, unspecified: Secondary | ICD-10-CM | POA: Insufficient documentation

## 2015-03-25 DIAGNOSIS — E089 Diabetes mellitus due to underlying condition without complications: Secondary | ICD-10-CM

## 2015-03-25 DIAGNOSIS — R0689 Other abnormalities of breathing: Secondary | ICD-10-CM

## 2015-03-25 DIAGNOSIS — Z87891 Personal history of nicotine dependence: Secondary | ICD-10-CM | POA: Insufficient documentation

## 2015-03-25 DIAGNOSIS — K056 Periodontal disease, unspecified: Secondary | ICD-10-CM

## 2015-03-25 DIAGNOSIS — E119 Type 2 diabetes mellitus without complications: Secondary | ICD-10-CM | POA: Insufficient documentation

## 2015-03-25 DIAGNOSIS — E785 Hyperlipidemia, unspecified: Secondary | ICD-10-CM | POA: Insufficient documentation

## 2015-03-25 DIAGNOSIS — Z8673 Personal history of transient ischemic attack (TIA), and cerebral infarction without residual deficits: Secondary | ICD-10-CM | POA: Insufficient documentation

## 2015-03-25 DIAGNOSIS — I10 Essential (primary) hypertension: Secondary | ICD-10-CM | POA: Insufficient documentation

## 2015-03-25 DIAGNOSIS — R05 Cough: Secondary | ICD-10-CM | POA: Insufficient documentation

## 2015-03-25 LAB — GLUCOSE, POCT (MANUAL RESULT ENTRY): POC Glucose: 104 mg/dl — AB (ref 70–99)

## 2015-03-25 MED ORDER — ISOSORBIDE MONONITRATE ER 30 MG PO TB24: 60.0000 mg | ORAL_TABLET | Freq: Every day | ORAL | Status: DC

## 2015-03-25 NOTE — Progress Notes (Signed)
Subjective:    Patient ID: Lindsay Rivera, female    DOB: March 29, 1960, 55 y.o.   MRN: [id number]  HPI Chief Complaint  Patient presents with  . Congestive Heart Failure    Pt with ongoing chest pain.   f/u chf.  Hx of cabg 2015 and issues with frequent falls.  Also issue with chest wall pain.  Suddenly gets light headed and will fall.  Now still chest pain and is sharp.  Pt using more nitros.  At least 2-3 weeks use of same.  Starts in fingers and increases and into the chest . This is different from the pain in the bone.  Pt notes more dyspnea when this comes on.  Notes more swelling in feet.  Notes min cough.  Pt quit smoking.  Bp at home will be high at home.  A lot stress at home.    Pt notes sl wheeze.  Pt with qhs chest pain.  When pain comes on is much more dyspneic.  Unable to tie shoe, min exertion will get dyspneic.  Pt denies any significant sore throat, nasal congestion or excess secretions, fever, chills, sweats, unintended weight loss, Pt does NOTE exertional chest pain, with orthopnea and PND, and leg swelling Pt denies any increase in rescue therapy over baseline, Pt notes  waking up with  dyspnea. Pt also denies any obvious fluctuation in symptoms with  weather or environmental change or other alleviating or aggravating factors  Past Medical History  Diagnosis Date  . Depression   . Bipolar disorder ([place])   . History of hysterectomy   . 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 MI (myocardial infarction) ([place]) 02/08/2013  . Diabetes mellitus without complication ([place])   . Hypertension   . Asthma   . Arthritis   . Anxiety   . GERD (gastroesophageal reflux disease)   . Hyperlipidemia   . Constipation   . CVA (cerebral infarction) 2010  . Hypothyroidism   . Anemia   . CHF (congestive heart failure) ([place])   . Stroke [facility])      Family History  Problem Relation Age of Onset  . Diabetes Mother   . Hypertension Mother   . Stomach cancer Mother   . Heart disease Father   .  Hypertension Father   . Cancer Father     colon cancer   . Colon cancer Father   . Colon cancer Maternal Grandfather      Social History   Social History  . Marital Status: Divorced    Spouse Name: N/A  . Number of Children: N/A  . Years of Education: N/A   Occupational History  . Not on file.   Social History Main Topics  . Smoking status: Former Smoker    Quit date: 02/08/2013  . Smokeless tobacco: Never Used  . Alcohol Use: 0.0 oz/week    0 Standard drinks or equivalent per week     Comment: rare glass of wine  . Drug Use: No  . Sexual Activity: Not on file   Other Topics Concern  . Not on file   Social History Narrative   Current smoker     Allergies  Allergen Reactions  . Penicillins Swelling  . Latex Rash     Outpatient Prescriptions Prior to Visit  Medication Sig Dispense Refill  . albuterol (PROVENTIL HFA;VENTOLIN HFA) 108 (90 BASE) MCG/ACT inhaler Inhale 2 puffs into the lungs every 6 (six) hours as needed for wheezing. 3 Inhaler 2  .  amLODipine (NORVASC) 5 MG tablet TAKE 1 TABLET BY MOUTH DAILY. 30 tablet 1  . aspirin EC 325 MG tablet Take 325 mg by mouth daily.    Marland Kitchen atorvastatin (LIPITOR) 40 MG tablet Take 1 tablet (40 mg total) by mouth at bedtime. 30 tablet 12  . buPROPion (WELLBUTRIN XL) 150 MG 24 hr tablet Take 1 tablet (150 mg total) by mouth every evening. 30 tablet 0  . Calcium Carbonate-Vitamin D 600-400 MG-UNIT per chew tablet Chew 1 tablet by mouth daily. Reported on 03/25/2015    . clotrimazole (LOTRIMIN) 1 % cream Apply 1 application topically 2 (two) times daily as needed. Fungus    . diazepam (VALIUM) 5 MG tablet Take 5 mg by mouth 2 (two) times daily as needed for anxiety.    . fluticasone (FLONASE) 50 MCG/ACT nasal spray Place 2 sprays into both nostrils daily. 16 g 3  . isosorbide mononitrate (IMDUR) 30 MG 24 hr tablet TAKE 1 TABLET BY MOUTH DAILY. 30 tablet 2  . levothyroxine (SYNTHROID, LEVOTHROID) 112 MCG tablet Take 1 tablet (112 mcg  total) by mouth daily. 30 tablet 3  . lidocaine (LIDODERM) 5 % Place 1 patch onto the skin daily as needed (pain). Remove & Discard patch within 12 hours or as directed by MD    . nitroGLYCERIN (NITROSTAT) 0.4 MG SL tablet PLACE ONE TABLET UNDER THE TONGUE EVERY 5 MINUTES AS NEEDED FOR CHEST PAIN 50 tablet 0  . omeprazole (PRILOSEC) 20 MG capsule TAKE 2 CAPSULES BY MOUTH DAILY 60 capsule 1  . oxyCODONE-acetaminophen (PERCOCET) 7.5-325 MG per tablet Take 1 tablet by mouth every 4 (four) hours as needed for pain.    . pantoprazole (PROTONIX) 40 MG tablet Take 1 tablet by mouth twice daily 60 tablet 4  . Polyethylene Glycol 3350 (MIRALAX PO) Take 17 g by mouth daily as needed. Constipation    . propranolol (INDERAL) 20 MG tablet Take 1 tablet (20 mg total) by mouth 2 (two) times daily as needed (anxiety). 60 tablet 0  . QUEtiapine (SEROQUEL) 400 MG tablet Take 600 mg by mouth at bedtime.    Marland Kitchen QUEtiapine (SEROQUEL) 50 MG tablet Take 50 mg by mouth 2 (two) times daily.    . clindamycin (CLEOCIN) 150 MG capsule Take 2 capsules (300 mg total) by mouth 3 (three) times daily. May dispense as 150mg  capsules (Patient not taking: Reported on 01/20/2015) 60 capsule 0  . Estradiol (VAGIFEM) 10 MCG TABS vaginal tablet Place 1 tablet (10 mcg total) vaginally 2 (two) times a week. For 2 weeks (Patient not taking: Reported on 01/20/2015) 30 tablet 1  . fluconazole (DIFLUCAN) 150 MG tablet Take 1 tablet (150 mg total) by mouth every 3 (three) days. (Patient not taking: Reported on 01/20/2015) 2 tablet 0   No facility-administered medications prior to visit.    Review of Systems Constitutional:   No  weight loss, night sweats,  Fevers, chills,++ fatigue, lassitude. HEENT:    headaches,  Difficulty swallowing,++  Tooth/dental problems,  Sore throat,                No sneezing, itching, ear ache, nasal congestion, post nasal drip,   CV:  Notes  chest pain,  +++Orthopnea, +++PND, +++swelling in lower extremities,  anasarca, +++dizziness, palpitations  GI  No heartburn, indigestion, abdominal pain, nausea, vomiting, diarrhea, change in bowel habits, loss of appetite  Resp: Notes  shortness of breath with exertion and  at rest.  No excess mucus, no productive cough,  Notes  non-productive cough,  No coughing up of blood.  No change in color of mucus.  No wheezing.  No chest wall deformity  Skin: no rash or lesions.  GU: no dysuria, change in color of urine, no urgency or frequency.  No flank pain.  MS:  No joint pain or swelling.  No decreased range of motion.  No back pain.  Psych:  No change in mood or affect. Notes depression and anxiety.  No memory loss.     Objective:   Physical Exam Filed Vitals:   03/25/15 0939  BP: 136/92  Pulse: 59  Temp: 97.9 F (36.6 C)  TempSrc: Oral  Resp: 20  Height: 5\' 2"  (1.575 m)  Weight: 178 lb 3.2 oz (80.831 kg)  SpO2: 98%    Gen: Pleasant, well-nourished, in no distress,  normal affect  ENT: No lesions,  mouth clear,  oropharynx clear, no postnasal drip, severe periodontal disease and tooth loss particularly around the posterior molars of the upper and lower dentition  Neck: No JVD, no TMG, no carotid bruits  Lungs: No use of accessory muscles, no dullness to percussion, clear without rales or rhonchi  Cardiovascular: RRR, heart sounds normal, no murmur or gallops, no peripheral edema, anterior chest is tender along the sternum were sternal wires are in place  Abdomen: soft and NT, no HSM,  BS normal  Musculoskeletal: No deformities, no cyanosis or clubbing  Neuro: alert, non focal  Skin: Warm, no lesions or rashes  No results found.   All previous cardiac imaging studies have been reviewed including previous ECGs the entire record has been reviewed as well from previous cardiac encounters    Assessment & Plan:  I personally reviewed all images and lab data in the [facility] system as well as any outside material available during this office  visit and agree with the  radiology impressions.   CAD- CABG x 09 Feb 2013 (ND) Ongoing chest pain of two types.  One is due to sternotomy.  This is non anginal.  Second pain associated with severe dyspnea and NTG SL overuse appears to be ischemic.  Myoview from 2015 was neg for ischemia. Last seen by Cardiology 03/2014 Dr wall. This pain is new and different from the pain described with sternal wires.  CT Surgeon offered to remove sternal wires but only after  the patient had tooth work performed.  The patient does not have any evidence of acute tracheobronchitis at this time Plan Obtain brain natruretic peptide level Obtain troponin level Obtain Myoview study Refer to dental for evaluation of periodontal disease in preparation for potential removal of some sternal wires at a later date May need to be seen by interventional cardiology if Myoview is positive for ongoing ischemia Increase Imdur to 60 mg daily Return to cardiopulmonary clinic in one month We will contact the patient with results sooner  Diabetes mellitus due to underlying condition without complications Capillary blood glucose today is satisfactory at 104 Recent hemoglobin A1c 6.3 No need for additional intervention from a diabetic perspective  Chest pain As per coronary artery disease assessment this patient has 2 forms of chest pain which need further clarification I'm concerned one of the chest pain syndromes that associated with dyspnea is anginal in nature  Periodontal disease Ongoing periodontal disease with associated tooth loss Sternal wires can be removed further dental assessment is necessary Plan Refer to dental clinic   Angalena was seen today for congestive heart failure.  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

## 2015-03-25 NOTE — Assessment & Plan Note (Signed)
Ongoing periodontal disease with associated tooth loss Sternal wires can be removed further dental assessment is necessary Plan Refer to dental clinic

## 2015-03-25 NOTE — Assessment & Plan Note (Signed)
Ongoing chest pain of two types.  One is due to sternotomy.  This is non anginal.  Second pain associated with severe dyspnea and NTG SL overuse appears to be ischemic.  Myoview from 2015 was neg for ischemia. Last seen by Cardiology 03/2014 Dr wall. This pain is new and different from the pain described with sternal wires.  CT Surgeon offered to remove sternal wires but only after  the patient had tooth work performed.  The patient does not have any evidence of acute tracheobronchitis at this time Plan Obtain brain natruretic peptide level Obtain troponin level Obtain Myoview study Refer to dental for evaluation of periodontal disease in preparation for potential removal of some sternal wires at a later date May need to be seen by interventional cardiology if Myoview is positive for ongoing ischemia Increase Imdur to 60 mg daily Return to cardiopulmonary clinic in one month We will contact the patient with results sooner

## 2015-03-25 NOTE — Assessment & Plan Note (Signed)
As per coronary artery disease assessment this patient has 2 forms of chest pain which need further clarification I'm concerned one of the chest pain syndromes that associated with dyspnea is anginal in nature

## 2015-03-25 NOTE — Patient Instructions (Addendum)
A nuclear medicine scan will be obtained to check the heart Labs today  Imdur increased to 60mg  daily: use two 30mg  tablets A dental referral will be obtained  Return 1 month

## 2015-03-25 NOTE — Assessment & Plan Note (Signed)
Capillary blood glucose today is satisfactory at 104 Recent hemoglobin A1c 6.3 No need for additional intervention from a diabetic perspective

## 2015-03-25 NOTE — Progress Notes (Signed)
Patient is here for COPD FU  Patient complains of SOB, Wheezing, and bilateral swelling in feet.  Patient complains of her breathing causing her to be off balance.  Patient denies pain at this time.

## 2015-03-26 LAB — TROPONIN I

## 2015-03-27 ENCOUNTER — Telehealth: Payer: Self-pay | Admitting: *Deleted

## 2015-03-27 NOTE — Telephone Encounter (Signed)
Patient verified DOB Patient informed of cardiac labs being "ok". Patient advised to keep myoview appointment. Patient expressed her understanding and had no further questions at this time.

## 2015-03-27 NOTE — Telephone Encounter (Signed)
-----   Message from Elsie Stain, MD sent at 03/27/2015  7:49 AM EST ----- [place] ,  Can you call the pt and tell her cardiac labs ok, but keep her myoview appt

## 2015-04-01 ENCOUNTER — Telehealth (HOSPITAL_COMMUNITY): Payer: Self-pay | Admitting: *Deleted

## 2015-04-01 NOTE — Telephone Encounter (Signed)
Patient given detailed instructions per Myocardial Perfusion Study Information Sheet for the test on 04/06/15. Patient notified to arrive 15 minutes early and that it is imperative to arrive on time for appointment to keep from having the test rescheduled.  If you need to cancel or reschedule your appointment, please call the office within 24 hours of your appointment. Failure to do so may result in a cancellation of your appointment, and a $50 no show fee. Patient verbalized understanding.Hubbard Robinson, RN

## 2015-04-06 ENCOUNTER — Encounter (HOSPITAL_COMMUNITY): Payer: Self-pay | Attending: Internal Medicine

## 2015-04-06 DIAGNOSIS — I209 Angina pectoris, unspecified: Secondary | ICD-10-CM | POA: Insufficient documentation

## 2015-04-06 LAB — MYOCARDIAL PERFUSION IMAGING
CHL CUP NUCLEAR SDS: 0
LHR: 0.46
LV dias vol: 76 mL
LVSYSVOL: 27 mL
Peak HR: 76 {beats}/min
Rest HR: 56 {beats}/min
SRS: 4
SSS: 4
TID: 1.48

## 2015-04-06 MED ORDER — TECHNETIUM TC 99M SESTAMIBI GENERIC - CARDIOLITE
10.6000 | Freq: Once | INTRAVENOUS | Status: AC | PRN
  Administered 2015-04-06: 11 via INTRAVENOUS

## 2015-04-06 MED ORDER — REGADENOSON 0.4 MG/5ML IV SOLN
0.4000 mg | Freq: Once | INTRAVENOUS | Status: AC
  Administered 2015-04-06: 0.4 mg via INTRAVENOUS

## 2015-04-06 MED ORDER — TECHNETIUM TC 99M SESTAMIBI GENERIC - CARDIOLITE
30.7000 | Freq: Once | INTRAVENOUS | Status: AC | PRN
  Administered 2015-04-06: 30.7 via INTRAVENOUS

## 2015-04-07 ENCOUNTER — Telehealth: Payer: Self-pay | Admitting: *Deleted

## 2015-04-07 DIAGNOSIS — I209 Angina pectoris, unspecified: Secondary | ICD-10-CM

## 2015-04-07 NOTE — Telephone Encounter (Signed)
MA not able to leave a VM due to system not being set up yet.   !!!Please inform patient of Cardiology referral being placed after reviewing patients results!!!

## 2015-04-07 NOTE — Telephone Encounter (Signed)
-----   Message from Elsie Stain, MD sent at 04/07/2015  9:40 AM EST ----- [place]  This pt needs cardiology appt ASAP for angina/ ischemic heart disease, has seen Dr Glennie Hawk in the past   [place].    Tell pt her myoview is positive for mild ischemia.  I will also try to call the pt

## 2015-04-08 MED FILL — ISOSORBIDE MN ER 30 MG TAB: 30 | 30 days supply | Qty: 60 | Fill #0

## 2015-04-15 ENCOUNTER — Ambulatory Visit (INDEPENDENT_AMBULATORY_CARE_PROVIDER_SITE_OTHER): Payer: Self-pay | Admitting: Cardiovascular Disease

## 2015-04-15 ENCOUNTER — Encounter: Payer: Self-pay | Admitting: Nurse Practitioner

## 2015-04-15 ENCOUNTER — Other Ambulatory Visit: Payer: Self-pay | Admitting: Internal Medicine

## 2015-04-15 ENCOUNTER — Encounter: Payer: Self-pay | Admitting: Cardiovascular Disease

## 2015-04-15 ENCOUNTER — Other Ambulatory Visit: Payer: Self-pay | Admitting: Cardiology

## 2015-04-15 ENCOUNTER — Other Ambulatory Visit: Payer: Self-pay | Admitting: Family Medicine

## 2015-04-15 VITALS — BP 110/72 | HR 66 | Ht 62.0 in | Wt 172.8 lb

## 2015-04-15 DIAGNOSIS — E785 Hyperlipidemia, unspecified: Secondary | ICD-10-CM

## 2015-04-15 DIAGNOSIS — I25110 Atherosclerotic heart disease of native coronary artery with unstable angina pectoris: Secondary | ICD-10-CM

## 2015-04-15 LAB — LIPID PANEL
CHOL/HDL RATIO: 4.2 ratio (ref <=5.0)
Cholesterol: 189 mg/dL (ref 125–200)
HDL: 45 mg/dL — AB (ref >=46)
LDL CALC: 114 mg/dL (ref <130)
Triglycerides: 151 mg/dL — ABNORMAL HIGH (ref <150)
VLDL: 30 mg/dL (ref <30)

## 2015-04-15 LAB — BASIC METABOLIC PANEL
BUN: 11 mg/dL (ref 7–25)
CALCIUM: 10 mg/dL (ref 8.6–10.4)
CHLORIDE: 101 mmol/L (ref 98–110)
CO2: 32 mmol/L — AB (ref 20–31)
Creat: 0.88 mg/dL (ref 0.50–1.05)
Glucose, Bld: 78 mg/dL (ref 65–99)
POTASSIUM: 3.8 mmol/L (ref 3.5–5.3)
SODIUM: 142 mmol/L (ref 135–146)

## 2015-04-15 LAB — CBC WITH DIFFERENTIAL/PLATELET
BASOS ABS: 0.1 10*3/uL (ref 0.0–0.1)
BASOS PCT: 1 % (ref 0–1)
EOS ABS: 0.2 10*3/uL (ref 0.0–0.7)
Eosinophils Relative: 3 % (ref 0–5)
HCT: 37.5 % (ref 36.0–46.0)
Hemoglobin: 12.4 g/dL (ref 12.0–15.0)
Lymphocytes Relative: 30 % (ref 12–46)
Lymphs Abs: 2.5 10*3/uL (ref 0.7–4.0)
MCH: 28.1 pg (ref 26.0–34.0)
MCHC: 33.1 g/dL (ref 30.0–36.0)
MCV: 84.8 fL (ref 78.0–100.0)
MPV: 9.4 fL (ref 8.6–12.4)
Monocytes Absolute: 0.6 10*3/uL (ref 0.1–1.0)
Monocytes Relative: 7 % (ref 3–12)
NEUTROS ABS: 4.9 10*3/uL (ref 1.7–7.7)
NEUTROS PCT: 59 % (ref 43–77)
PLATELETS: 418 10*3/uL — AB (ref 150–400)
RBC: 4.42 MIL/uL (ref 3.87–5.11)
RDW: 17.1 % — ABNORMAL HIGH (ref 11.5–15.5)
WBC: 8.3 10*3/uL (ref 4.0–10.5)

## 2015-04-15 MED ORDER — ASPIRIN EC 81 MG PO TBEC: 81.0000 mg | DELAYED_RELEASE_TABLET | Freq: Every day | ORAL | Status: DC

## 2015-04-15 MED FILL — ?PANTOPRAZOLE SOD DR 40MG: 40 MG | 30 days supply | Qty: 60 | Fill #4

## 2015-04-15 MED FILL — ?BUPROPION HCL SR 150 MG TA: 150 | 30 days supply | Qty: 60 | Fill #1

## 2015-04-15 MED FILL — PROPRANOLOL 20 MG TABLET: 20 | 30 days supply | Qty: 60 | Fill #1

## 2015-04-15 MED FILL — FLUTICASONE PROP 50 MCG SPR: 50 | 30 days supply | Qty: 16 | Fill #2

## 2015-04-15 MED FILL — ?FLUOXETINE HCL 20MG TABLET: 20 | 30 days supply | Qty: 30 | Fill #1

## 2015-04-15 NOTE — Telephone Encounter (Signed)
Let's wait on her results.  I will notify her once results are available and send in refill.  Thank you.

## 2015-04-15 NOTE — Patient Instructions (Addendum)
Medication Instructions:  CHANGE Aspirin to 81 mg once daily   Labwork: TODAY - CBC, Basic metabolic panel, Pt/INR   Testing/Procedures: Your physician has requested that you have a cardiac catheterization. Cardiac catheterization is used to diagnose and/or treat various heart conditions. Doctors may recommend this procedure for a number of different reasons. The most common reason is to evaluate chest pain. Chest pain can be a symptom of coronary artery disease (CAD), and cardiac catheterization can show whether plaque is narrowing or blocking your heart's arteries. This procedure is also used to evaluate the valves, as well as measure the blood flow and oxygen levels in different parts of your heart. For further information please visit [email or web address]. Please follow instruction sheet, as given.   Follow-Up: Your physician wants you to follow-up in: 6 months with Dr. Acie Fredrickson.  You will receive a reminder letter in the mail two months in advance. If you don't receive a letter, please call our office to schedule the follow-up appointment.   If you need a refill on your cardiac medications before your next appointment, please call your pharmacy.   Thank you for choosing [facility]! Christen Bame, RN [phone]

## 2015-04-15 NOTE — Progress Notes (Signed)
Cardiology Office Note   Date:  04/15/2015   ID:  Lindsay Rivera, DOB 03-10-1960, MRN [id number]  PCP:  Lance Bosch, NP  Cardiologist:   Thayer Headings, MD   Chief Complaint  Patient presents with  . Follow-up    some SOB with activity, some CP no nitro taking for it   Problem list 1. Coronary artery disease- s/p CABG Jan. 2015, [place]  2. Hypertension 3. Hypothyroidism 4. CVA -    History of Present Illness: Lindsay Rivera is a 55 y.o. female who presents for follow up of her CAD and chest wall pain .   Seen with Joycelyn Schmid (significant other)  Still has pain associated with her sternal wires.   Walks frequently  - causes chest pain  Also has chest pain while sitting  Has been using lots of SL NTG  - completely relieves the pain .  She's been seen by Dr. Joya Gaskins who ordered a stress Myoview. The Myoview revealed an mid  anterior defect.  Past Medical History  Diagnosis Date  . Depression   . Bipolar disorder ([place])   . History of hysterectomy   . 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 MI (myocardial infarction) ([place]) 02/08/2013  . Diabetes mellitus without complication ([place])   . Hypertension   . Asthma   . Arthritis   . Anxiety   . GERD (gastroesophageal reflux disease)   . Hyperlipidemia   . Constipation   . CVA (cerebral infarction) 2010  . Hypothyroidism   . Anemia   . CHF (congestive heart failure) ([place])   . Stroke [facility])     Past Surgical History  Procedure Laterality Date  . Coronary artery bypass graft  02/10/2013  . Thyroid surgery  85/2014  . Appendectomy  2008  . Abdominal hysterectomy  2004  . Left knee surgery   1994     Current Outpatient Prescriptions  Medication Sig Dispense Refill  . albuterol (PROVENTIL HFA;VENTOLIN HFA) 108 (90 BASE) MCG/ACT inhaler Inhale 2 puffs into the lungs every 6 (six) hours as needed for wheezing. 3 Inhaler 2  . amLODipine (NORVASC) 5 MG tablet TAKE 1 TABLET BY MOUTH DAILY. 30 tablet 1  . aspirin EC 325 MG tablet  Take 325 mg by mouth daily.    Marland Kitchen atorvastatin (LIPITOR) 40 MG tablet Take 1 tablet (40 mg total) by mouth at bedtime. 30 tablet 12  . buPROPion (WELLBUTRIN XL) 150 MG 24 hr tablet Take 1 tablet (150 mg total) by mouth every evening. 30 tablet 0  . Calcium Carbonate-Vitamin D 600-400 MG-UNIT per chew tablet Chew 1 tablet by mouth daily. Reported on 03/25/2015    . clotrimazole (LOTRIMIN) 1 % cream Apply 1 application topically 2 (two) times daily as needed. Fungus    . diazepam (VALIUM) 5 MG tablet Take 5 mg by mouth 2 (two) times daily as needed for anxiety.    . fluticasone (FLONASE) 50 MCG/ACT nasal spray Place 2 sprays into both nostrils daily. 16 g 3  . isosorbide mononitrate (IMDUR) 30 MG 24 hr tablet Take 2 tablets (60 mg total) by mouth daily. 60 tablet 2  . levothyroxine (SYNTHROID, LEVOTHROID) 112 MCG tablet Take 1 tablet (112 mcg total) by mouth daily. 30 tablet 3  . lidocaine (LIDODERM) 5 % Place 1 patch onto the skin daily as needed (pain). Remove & Discard patch within 12 hours or as directed by MD    . nitroGLYCERIN (NITROSTAT) 0.4 MG SL tablet PLACE  ONE TABLET UNDER THE TONGUE EVERY 5 MINUTES AS NEEDED FOR CHEST PAIN 50 tablet 0  . omeprazole (PRILOSEC) 20 MG capsule TAKE 2 CAPSULES BY MOUTH DAILY 60 capsule 1  . oxyCODONE-acetaminophen (PERCOCET) 7.5-325 MG per tablet Take 1 tablet by mouth every 4 (four) hours as needed for pain. Reported on 03/25/2015    . Polyethylene Glycol 3350 (MIRALAX PO) Take 17 g by mouth daily as needed. Constipation    . propranolol (INDERAL) 20 MG tablet Take 1 tablet (20 mg total) by mouth 2 (two) times daily as needed (anxiety). 60 tablet 0  . QUEtiapine (SEROQUEL) 400 MG tablet Take 600 mg by mouth at bedtime.    Marland Kitchen QUEtiapine (SEROQUEL) 50 MG tablet Take 50 mg by mouth 2 (two) times daily.     No current facility-administered medications for this visit.    Allergies:   Penicillins and Latex    Social History:  The patient  reports that she quit  smoking about 2 years ago. She has never used smokeless tobacco. She reports that she drinks alcohol. She reports that she does not use illicit drugs.   Family History:  The patient's family history includes Cancer in her father; Colon cancer in her father and maternal grandfather; Diabetes in her mother; Heart disease in her father; Hypertension in her father and mother; Stomach cancer in her mother.    ROS:  Please see the history of present illness.    Review of Systems: Constitutional:  denies fever, chills, diaphoresis, appetite change and fatigue.  HEENT: denies photophobia, eye pain, redness, hearing loss, ear pain, congestion, sore throat, rhinorrhea, sneezing, neck pain, neck stiffness and tinnitus.  Respiratory: denies SOB, DOE, cough, chest tightness, and wheezing.  Cardiovascular: denies chest pain, palpitations and leg swelling.  Gastrointestinal: denies nausea, vomiting, abdominal pain, diarrhea, constipation, blood in stool.  Genitourinary: denies dysuria, urgency, frequency, hematuria, flank pain and difficulty urinating.  Musculoskeletal: denies  myalgias, back pain, joint swelling, arthralgias and gait problem.   Skin: denies pallor, rash and wound.  Neurological: denies dizziness, seizures, syncope, weakness, light-headedness, numbness and headaches.   Hematological: denies adenopathy, easy bruising, personal or family bleeding history.  Psychiatric/ Behavioral: denies suicidal ideation, mood changes, confusion, nervousness, sleep disturbance and agitation.       All other systems are reviewed and negative.    PHYSICAL EXAM: VS:  BP 110/72 mmHg  Pulse 66  Ht 5\' 2"  (1.575 m)  Wt 172 lb 12.8 oz (78.382 kg)  BMI 31.60 kg/m2  SpO2 95% , BMI Body mass index is 31.6 kg/(m^2). GEN: Well nourished, well developed, in no acute distress HEENT: normal Neck: no JVD, carotid bruits, or masses Cardiac: RRR; no murmurs, rubs, or gallops,no edema  Respiratory:  clear to  auscultation bilaterally, normal work of breathing GI: soft, nontender, nondistended, + BS MS: no deformity or atrophy Skin: warm and dry, no rash Neuro:  Strength and sensation are intact Psych: normal   EKG:  EKG is ordered today. The ekg ordered today demonstrates NSR at 66.  .  NS ST changes.    Recent Labs: 09/10/2014: ALT 10; BUN 11; Creat 0.92; Potassium 3.5; Sodium 141 01/20/2015: TSH 0.236*    Lipid Panel    Component Value Date/Time   CHOL 179 09/10/2014 1448   TRIG 153* 09/10/2014 1448   HDL 39* 09/10/2014 1448   CHOLHDL 4.6 09/10/2014 1448   VLDL 31* 09/10/2014 1448   LDLCALC 109 09/10/2014 1448      Wt Readings  from Last 3 Encounters:  04/15/15 172 lb 12.8 oz (78.382 kg)  04/06/15 178 lb (80.74 kg)  03/25/15 178 lb 3.2 oz (80.831 kg)      Other studies Reviewed: Additional studies/ records that were reviewed today include: . Review of the above records demonstrates:    ASSESSMENT AND PLAN:  1.  Coronary artery disease: [name] presents for further evaluation of some chest discomfort. She clearly has musculoskeletal/sternal pain but this seems to be different than her episodes of chest pressure. She describes chest pressure both at rest and with exertion. In response to nitroglycerin. She's had a Myoview study which reveals a small mid anterior wall defect. I suspect that this defect is just residual disease following her bypass surgery but she clearly has symptoms that are new since her bypass surgery in 2015.  We discussed the risks, benefits, and options concerning heart cavitation and possible PCI. She understands and agrees to proceed.  See her again in 6 month.  Current medicines are reviewed at length with the patient today.  The patient does not have concerns regarding medicines.  The following changes have been made:  no change  Labs/ tests ordered today include:  No orders of the defined types were placed in this encounter.      Disposition:   FU with me in 6 months       Nahser, Wonda Cheng, MD  04/15/2015 10:29 AM    [facility] [place], [place], [place]  [address] Phone: [phone]; Fax: [phone]   [facility]  [address] [place], [place]  [address] [phone]   Fax [phone]

## 2015-04-16 ENCOUNTER — Telehealth: Payer: Self-pay

## 2015-04-16 DIAGNOSIS — Z79899 Other long term (current) drug therapy: Secondary | ICD-10-CM

## 2015-04-16 LAB — PROTIME-INR
INR: 1.02 (ref <1.50)
Prothrombin Time: 13.5 seconds (ref 11.6–15.2)

## 2015-04-16 MED ORDER — ATORVASTATIN CALCIUM 80 MG PO TABS: 80.0000 mg | ORAL_TABLET | Freq: Every day | ORAL | Status: DC

## 2015-04-16 MED FILL — NITROSTAT 0.4 MG TABLET SL: 0.4 | 25 days supply | Qty: 25 | Fill #0

## 2015-04-16 MED FILL — ATORVASTATIN 80 MG TABLET: 80 | 30 days supply | Qty: 30 | Fill #0

## 2015-04-16 NOTE — Telephone Encounter (Signed)
Called patient about lab results. Per Dr. Acie Fredrickson, labs are okay for cath. Lipids are a bit elevated. Has hx of CAD. Would increase atrovastatin to 80. Check labs in 3 months. Patient verbalized understanding and she will return for labs on 07/17/15.

## 2015-04-16 NOTE — Telephone Encounter (Signed)
-----   Message from Thayer Headings, MD sent at 04/16/2015  1:27 PM EST ----- Labs are ok for cath.   Lipids are a bit elevated.    Has a hx of CAD. Would increase atorvastatin to 80 Check labs in 3 months

## 2015-04-16 NOTE — Addendum Note (Signed)
Addended by: Thayer Headings on: 04/16/2015 05:16 PM   Modules accepted: Orders

## 2015-04-17 ENCOUNTER — Inpatient Hospital Stay (HOSPITAL_COMMUNITY)
Admission: RE | Admit: 2015-04-17 | Discharge: 2015-04-21 | DRG: 247 | Disposition: A | Discharge status: Home / Self Care | Payer: Self-pay | Source: Ambulatory Visit | Attending: Cardiovascular Disease | Admitting: Cardiovascular Disease

## 2015-04-17 ENCOUNTER — Encounter (HOSPITAL_COMMUNITY): Payer: Self-pay | Admitting: General Practice

## 2015-04-17 ENCOUNTER — Encounter (HOSPITAL_COMMUNITY)
Admission: RE | Disposition: A | Discharge status: Home / Self Care | Payer: Self-pay | Source: Ambulatory Visit | Attending: Cardiology

## 2015-04-17 DIAGNOSIS — Z8673 Personal history of transient ischemic attack (TIA), and cerebral infarction without residual deficits: Secondary | ICD-10-CM

## 2015-04-17 DIAGNOSIS — I252 Old myocardial infarction: Secondary | ICD-10-CM

## 2015-04-17 DIAGNOSIS — R071 Chest pain on breathing: Secondary | ICD-10-CM

## 2015-04-17 DIAGNOSIS — R079 Chest pain, unspecified: Secondary | ICD-10-CM | POA: Diagnosis present

## 2015-04-17 DIAGNOSIS — Z87891 Personal history of nicotine dependence: Secondary | ICD-10-CM

## 2015-04-17 DIAGNOSIS — I25110 Atherosclerotic heart disease of native coronary artery with unstable angina pectoris: Secondary | ICD-10-CM | POA: Diagnosis present

## 2015-04-17 DIAGNOSIS — K219 Gastro-esophageal reflux disease without esophagitis: Secondary | ICD-10-CM | POA: Diagnosis present

## 2015-04-17 DIAGNOSIS — I2510 Atherosclerotic heart disease of native coronary artery without angina pectoris: Secondary | ICD-10-CM

## 2015-04-17 DIAGNOSIS — Z7982 Long term (current) use of aspirin: Secondary | ICD-10-CM

## 2015-04-17 DIAGNOSIS — E119 Type 2 diabetes mellitus without complications: Secondary | ICD-10-CM | POA: Diagnosis present

## 2015-04-17 DIAGNOSIS — E039 Hypothyroidism, unspecified: Secondary | ICD-10-CM | POA: Diagnosis present

## 2015-04-17 DIAGNOSIS — E876 Hypokalemia: Secondary | ICD-10-CM | POA: Diagnosis present

## 2015-04-17 DIAGNOSIS — Z951 Presence of aortocoronary bypass graft: Secondary | ICD-10-CM

## 2015-04-17 DIAGNOSIS — F319 Bipolar disorder, unspecified: Secondary | ICD-10-CM | POA: Diagnosis present

## 2015-04-17 DIAGNOSIS — E785 Hyperlipidemia, unspecified: Secondary | ICD-10-CM | POA: Diagnosis present

## 2015-04-17 DIAGNOSIS — E669 Obesity, unspecified: Secondary | ICD-10-CM | POA: Diagnosis present

## 2015-04-17 DIAGNOSIS — I25700 Atherosclerosis of coronary artery bypass graft(s), unspecified, with unstable angina pectoris: Principal | ICD-10-CM | POA: Diagnosis present

## 2015-04-17 DIAGNOSIS — R931 Abnormal findings on diagnostic imaging of heart and coronary circulation: Secondary | ICD-10-CM | POA: Diagnosis present

## 2015-04-17 DIAGNOSIS — I10 Essential (primary) hypertension: Secondary | ICD-10-CM | POA: Diagnosis present

## 2015-04-17 DIAGNOSIS — Z9861 Coronary angioplasty status: Secondary | ICD-10-CM

## 2015-04-17 DIAGNOSIS — Z6831 Body mass index (BMI) 31.0-31.9, adult: Secondary | ICD-10-CM

## 2015-04-17 DIAGNOSIS — G8929 Other chronic pain: Secondary | ICD-10-CM | POA: Diagnosis present

## 2015-04-17 HISTORY — DX: Other chronic pain: G89.29

## 2015-04-17 HISTORY — DX: Migraine, unspecified, not intractable, without status migrainosus: G43.909

## 2015-04-17 HISTORY — DX: Type 2 diabetes mellitus without complications: E11.9

## 2015-04-17 HISTORY — PX: CORONARY ANGIOPLASTY WITH STENT PLACEMENT: SHX49

## 2015-04-17 HISTORY — DX: Low back pain: M54.5

## 2015-04-17 HISTORY — PX: CARDIAC CATHETERIZATION: SHX172

## 2015-04-17 HISTORY — DX: Cerebral infarction, unspecified: I63.9

## 2015-04-17 HISTORY — DX: Low back pain, unspecified: M54.50

## 2015-04-17 LAB — GLUCOSE, CAPILLARY
GLUCOSE-CAPILLARY: 95 mg/dL (ref 65–99)
GLUCOSE-CAPILLARY: 98 mg/dL (ref 65–99)

## 2015-04-17 LAB — POCT ACTIVATED CLOTTING TIME: ACTIVATED CLOTTING TIME: 338 s

## 2015-04-17 SURGERY — LEFT HEART CATH AND CORS/GRAFTS ANGIOGRAPHY
Anesthesia: LOCAL | Laterality: Right

## 2015-04-17 MED ORDER — TICAGRELOR 90 MG PO TABS
ORAL_TABLET | ORAL | Status: DC | PRN
  Administered 2015-04-17: 180 mg via ORAL

## 2015-04-17 MED ORDER — INSULIN ASPART 100 UNIT/ML ~~LOC~~ SOLN
0.0000 [IU] | Freq: Three times a day (TID) | SUBCUTANEOUS | Status: DC
  Administered 2015-04-20: 2 [IU] via SUBCUTANEOUS

## 2015-04-17 MED ORDER — SODIUM CHLORIDE 0.9% FLUSH
3.0000 mL | Freq: Two times a day (BID) | INTRAVENOUS | Status: DC
  Administered 2015-04-18 – 2015-04-21 (×6): 3 mL via INTRAVENOUS

## 2015-04-17 MED ORDER — ASPIRIN EC 81 MG PO TBEC
81.0000 mg | DELAYED_RELEASE_TABLET | Freq: Every day | ORAL | Status: DC
  Administered 2015-04-18 – 2015-04-21 (×4): 81 mg via ORAL
  Filled 2015-04-17 (×4): qty 1

## 2015-04-17 MED ORDER — BUPROPION HCL ER (XL) 150 MG PO TB24
150.0000 mg | ORAL_TABLET | Freq: Every evening | ORAL | Status: DC
  Administered 2015-04-17 – 2015-04-20 (×4): 150 mg via ORAL
  Filled 2015-04-17 (×3): qty 1

## 2015-04-17 MED ORDER — ASPIRIN 81 MG PO CHEW: 81.0000 mg | CHEWABLE_TABLET | ORAL | Status: DC

## 2015-04-17 MED ORDER — PNEUMOCOCCAL VAC POLYVALENT 25 MCG/0.5ML IJ INJ: 0.5000 mL | INJECTION | INTRAMUSCULAR | Status: DC

## 2015-04-17 MED ORDER — SODIUM CHLORIDE 0.9 % IV SOLN: 250.0000 mL | INTRAVENOUS | Status: DC | PRN

## 2015-04-17 MED ORDER — TICAGRELOR 90 MG PO TABS
90.0000 mg | ORAL_TABLET | Freq: Two times a day (BID) | ORAL | Status: DC
Start: 2015-04-17 — End: 2015-04-21
  Administered 2015-04-18 – 2015-04-21 (×8): 90 mg via ORAL
  Filled 2015-04-17 (×8): qty 1

## 2015-04-17 MED ORDER — NITROGLYCERIN 0.4 MG SL SUBL
0.4000 mg | SUBLINGUAL_TABLET | SUBLINGUAL | Status: DC | PRN
  Administered 2015-04-17 – 2015-04-18 (×4): 0.4 mg via SUBLINGUAL
  Filled 2015-04-17 (×2): qty 25
  Filled 2015-04-17: qty 1

## 2015-04-17 MED ORDER — TICAGRELOR 90 MG PO TABS
ORAL_TABLET | ORAL | Status: AC
  Filled 2015-04-17: qty 2

## 2015-04-17 MED ORDER — FLUTICASONE PROPIONATE 50 MCG/ACT NA SUSP
2.0000 | Freq: Every day | NASAL | Status: DC
  Filled 2015-04-17 (×2): qty 16

## 2015-04-17 MED ORDER — ONDANSETRON HCL 4 MG/2ML IJ SOLN
4.0000 mg | Freq: Four times a day (QID) | INTRAMUSCULAR | Status: DC | PRN
  Administered 2015-04-17 – 2015-04-20 (×2): 4 mg via INTRAVENOUS
  Filled 2015-04-17 (×2): qty 2

## 2015-04-17 MED ORDER — LIDOCAINE HCL (PF) 1 % IJ SOLN
INTRAMUSCULAR | Status: AC
  Filled 2015-04-17: qty 30

## 2015-04-17 MED ORDER — ATORVASTATIN CALCIUM 80 MG PO TABS
80.0000 mg | ORAL_TABLET | Freq: Every day | ORAL | Status: DC
  Administered 2015-04-17 – 2015-04-20 (×4): 80 mg via ORAL
  Filled 2015-04-17 (×4): qty 1

## 2015-04-17 MED ORDER — NITROGLYCERIN 0.4 MG SL SUBL: 0.4000 mg | SUBLINGUAL_TABLET | SUBLINGUAL | Status: DC | PRN

## 2015-04-17 MED ORDER — SODIUM CHLORIDE 0.9% FLUSH: 3.0000 mL | INTRAVENOUS | Status: DC | PRN

## 2015-04-17 MED ORDER — SODIUM CHLORIDE 0.9% FLUSH: 3.0000 mL | Freq: Two times a day (BID) | INTRAVENOUS | Status: DC

## 2015-04-17 MED ORDER — FENTANYL CITRATE (PF) 100 MCG/2ML IJ SOLN
INTRAMUSCULAR | Status: DC | PRN
  Administered 2015-04-17 (×2): 25 ug via INTRAVENOUS

## 2015-04-17 MED ORDER — ISOSORBIDE MONONITRATE ER 60 MG PO TB24
60.0000 mg | ORAL_TABLET | Freq: Every day | ORAL | Status: DC
  Administered 2015-04-18: 10:00:00 60 mg via ORAL
  Filled 2015-04-17: qty 1

## 2015-04-17 MED ORDER — LIDOCAINE HCL (PF) 1 % IJ SOLN
INTRAMUSCULAR | Status: DC | PRN
  Administered 2015-04-17: 20 mL via INTRADERMAL

## 2015-04-17 MED ORDER — MIDAZOLAM HCL 2 MG/2ML IJ SOLN
INTRAMUSCULAR | Status: AC
  Filled 2015-04-17: qty 2

## 2015-04-17 MED ORDER — PROPRANOLOL HCL 20 MG PO TABS
20.0000 mg | ORAL_TABLET | Freq: Two times a day (BID) | ORAL | Status: DC | PRN
  Filled 2015-04-17: qty 1

## 2015-04-17 MED ORDER — BIVALIRUDIN 250 MG IV SOLR
INTRAVENOUS | Status: AC
  Filled 2015-04-17: qty 250

## 2015-04-17 MED ORDER — NITROGLYCERIN 0.4 MG SL SUBL
SUBLINGUAL_TABLET | SUBLINGUAL | Status: AC
  Administered 2015-04-17: 0.4 mg via SUBLINGUAL
  Filled 2015-04-17: qty 1

## 2015-04-17 MED ORDER — QUETIAPINE FUMARATE 50 MG PO TABS
50.0000 mg | ORAL_TABLET | Freq: Two times a day (BID) | ORAL | Status: DC
  Administered 2015-04-17 – 2015-04-21 (×8): 50 mg via ORAL
  Filled 2015-04-17 (×7): qty 1

## 2015-04-17 MED ORDER — SODIUM CHLORIDE 0.9 % IV SOLN
250.0000 mg | INTRAVENOUS | Status: DC | PRN
  Administered 2015-04-17: 1.75 mg/kg/h via INTRAVENOUS

## 2015-04-17 MED ORDER — DIAZEPAM 5 MG PO TABS
5.0000 mg | ORAL_TABLET | Freq: Two times a day (BID) | ORAL | Status: DC | PRN
  Administered 2015-04-17 – 2015-04-20 (×2): 5 mg via ORAL
  Filled 2015-04-17 (×2): qty 1

## 2015-04-17 MED ORDER — ASPIRIN 81 MG PO CHEW: 81.0000 mg | CHEWABLE_TABLET | Freq: Every day | ORAL | Status: DC

## 2015-04-17 MED ORDER — SODIUM CHLORIDE 0.9 % WEIGHT BASED INFUSION
3.0000 mL/kg/h | INTRAVENOUS | Status: DC
  Administered 2015-04-17: 3 mL/kg/h via INTRAVENOUS

## 2015-04-17 MED ORDER — SODIUM CHLORIDE 0.9 % IV SOLN
INTRAVENOUS | Status: DC
Start: 2015-04-17 — End: 2015-04-21
  Administered 2015-04-17: 16:00:00 via INTRAVENOUS

## 2015-04-17 MED ORDER — HEPARIN (PORCINE) IN NACL 2-0.9 UNIT/ML-% IJ SOLN
INTRAMUSCULAR | Status: AC
  Filled 2015-04-17: qty 1500

## 2015-04-17 MED ORDER — ATROPINE SULFATE 0.1 MG/ML IJ SOLN
INTRAMUSCULAR | Status: DC
Start: 2015-04-17 — End: 2015-04-17
  Filled 2015-04-17: qty 10

## 2015-04-17 MED ORDER — NITROGLYCERIN 1 MG/10 ML FOR IR/CATH LAB
INTRA_ARTERIAL | Status: DC | PRN
  Administered 2015-04-17 (×2): 200 ug via INTRACORONARY

## 2015-04-17 MED ORDER — LEVOTHYROXINE SODIUM 112 MCG PO TABS
112.0000 ug | ORAL_TABLET | Freq: Every day | ORAL | Status: DC
  Administered 2015-04-18 – 2015-04-21 (×4): 112 ug via ORAL
  Filled 2015-04-17 (×4): qty 1

## 2015-04-17 MED ORDER — FENTANYL CITRATE (PF) 100 MCG/2ML IJ SOLN
INTRAMUSCULAR | Status: AC
  Filled 2015-04-17: qty 2

## 2015-04-17 MED ORDER — MIDAZOLAM HCL 2 MG/2ML IJ SOLN
INTRAMUSCULAR | Status: DC | PRN
  Administered 2015-04-17: 1 mg via INTRAVENOUS
  Administered 2015-04-17: 2 mg via INTRAVENOUS

## 2015-04-17 MED ORDER — ATORVASTATIN CALCIUM 80 MG PO TABS: 80.0000 mg | ORAL_TABLET | Freq: Every day | ORAL | Status: DC

## 2015-04-17 MED ORDER — PANTOPRAZOLE SODIUM 40 MG PO TBEC
40.0000 mg | DELAYED_RELEASE_TABLET | Freq: Two times a day (BID) | ORAL | Status: DC
  Administered 2015-04-17 – 2015-04-21 (×8): 40 mg via ORAL
  Filled 2015-04-17 (×8): qty 1

## 2015-04-17 MED ORDER — ACETAMINOPHEN 325 MG PO TABS: 650.0000 mg | ORAL_TABLET | ORAL | Status: DC | PRN

## 2015-04-17 MED ORDER — HEPARIN (PORCINE) IN NACL 2-0.9 UNIT/ML-% IJ SOLN
INTRAMUSCULAR | Status: DC | PRN
Start: 2015-04-17 — End: 2015-04-17
  Administered 2015-04-17: 1500 mL

## 2015-04-17 MED ORDER — HYDRALAZINE HCL 20 MG/ML IJ SOLN
10.0000 mg | INTRAMUSCULAR | Status: DC | PRN
  Administered 2015-04-17: 19:00:00 10 mg via INTRAVENOUS
  Filled 2015-04-17: qty 1

## 2015-04-17 MED ORDER — QUETIAPINE FUMARATE 300 MG PO TABS
600.0000 mg | ORAL_TABLET | Freq: Every day | ORAL | Status: DC
  Administered 2015-04-17 – 2015-04-20 (×4): 600 mg via ORAL
  Filled 2015-04-17 (×4): qty 2

## 2015-04-17 MED ORDER — ALBUTEROL SULFATE (2.5 MG/3ML) 0.083% IN NEBU: 3.0000 mL | INHALATION_SOLUTION | Freq: Four times a day (QID) | RESPIRATORY_TRACT | Status: DC | PRN

## 2015-04-17 MED ORDER — IOHEXOL 350 MG/ML SOLN
INTRAVENOUS | Status: DC | PRN
  Administered 2015-04-17: 280 mL via INTRAVENOUS

## 2015-04-17 MED ORDER — SODIUM CHLORIDE 0.9 % WEIGHT BASED INFUSION: 1.0000 mL/kg/h | INTRAVENOUS | Status: DC

## 2015-04-17 MED ORDER — FLUOXETINE HCL 20 MG PO CAPS
20.0000 mg | ORAL_CAPSULE | Freq: Every day | ORAL | Status: DC
  Administered 2015-04-18 – 2015-04-21 (×4): 20 mg via ORAL
  Filled 2015-04-17 (×4): qty 1

## 2015-04-17 MED ORDER — AMLODIPINE BESYLATE 5 MG PO TABS: 5.0000 mg | ORAL_TABLET | Freq: Every day | ORAL | Status: DC

## 2015-04-17 MED ORDER — BIVALIRUDIN BOLUS VIA INFUSION - CUPID
INTRAVENOUS | Status: DC | PRN
  Administered 2015-04-17: 58.5 mg via INTRAVENOUS

## 2015-04-17 MED ORDER — NITROGLYCERIN 1 MG/10 ML FOR IR/CATH LAB
INTRA_ARTERIAL | Status: AC
  Filled 2015-04-17: qty 10

## 2015-04-17 SURGICAL SUPPLY — 21 items
BALLN ANGIOSCULPT RX 2.5X6 (BALLOONS) ×3
BALLN ~~LOC~~ EMERGE MR 3.5X6 (BALLOONS) ×3
BALLOON ANGIOSCULPT RX 2.5X6 (BALLOONS) ×2 IMPLANT
BALLOON ~~LOC~~ EMERGE MR 3.5X6 (BALLOONS) ×2 IMPLANT
CATH INFINITI 5 FR IM (CATHETERS) ×3 IMPLANT
CATH INFINITI 5 FR RCB (CATHETERS) ×3 IMPLANT
CATH INFINITI 5FR MULTPACK ANG (CATHETERS) ×3 IMPLANT
CATH VISTA GUIDE 6FR XBLAD3.5 (CATHETERS) ×3 IMPLANT
KIT ENCORE 26 ADVANTAGE (KITS) ×3 IMPLANT
KIT HEART LEFT (KITS) ×3 IMPLANT
PACK CARDIAC CATHETERIZATION (CUSTOM PROCEDURE TRAY) ×3 IMPLANT
SHEATH PINNACLE 5F 10CM (SHEATH) ×3 IMPLANT
SHEATH PINNACLE 6F 10CM (SHEATH) ×3 IMPLANT
STENT XIENCE ALPINE RX 3.0X8 (Permanent Stent) ×3 IMPLANT
SYR MEDRAD MARK V 150ML (SYRINGE) ×3 IMPLANT
TRANSDUCER W/STOPCOCK (MISCELLANEOUS) ×3 IMPLANT
TUBING CIL FLEX 10 FLL-RA (TUBING) ×3 IMPLANT
WIRE COUGAR XT STRL 190CM (WIRE) ×3 IMPLANT
WIRE EMERALD 3MM-J .025X260CM (WIRE) IMPLANT
WIRE EMERALD 3MM-J .035X150CM (WIRE) ×3 IMPLANT
WIRE EMERALD 3MM-J .035X260CM (WIRE) ×3 IMPLANT

## 2015-04-17 NOTE — Interval H&P Note (Signed)
Cath Lab Visit (complete for each Cath Lab visit)  Clinical Evaluation Leading to the Procedure:   ACS: No.  Non-ACS:    Anginal Classification: CCS III  Anti-ischemic medical therapy: Maximal Therapy (2 or more classes of medications)  Non-Invasive Test Results: Low-risk stress test findings: cardiac mortality <1%/year  Prior CABG: Previous CABG      History and Physical Interval Note:  04/17/2015 1:02 PM  Lindsay Rivera  has presented today for surgery, with the diagnosis of CAD, Unstable Angina  The various methods of treatment have been discussed with the patient and family. After consideration of risks, benefits and other options for treatment, the patient has consented to  Procedure(s): Left Heart Cath and Cors/Grafts Angiography (N/A) as a surgical intervention .  The patient's history has been reviewed, patient examined, no change in status, stable for surgery.  I have reviewed the patient's chart and labs.  Questions were answered to the patient's satisfaction.     Shyleigh Daughtry A

## 2015-04-17 NOTE — Progress Notes (Signed)
Site area: right groin  Site Prior to Removal:  Level 0  Pressure Applied For 20 MINUTES    Minutes Beginning at [facility]:   Yes.    Patient Status During Pull:  Stable; tolerated well  Post Pull Groin Site:  Level 0  Post Pull Instructions Given:  Yes.    Post Pull Pulses Present:  Yes.    Dressing Applied:  Yes.    Comments:  distal pulses checked during hold time q 2-3 minutes; feeling for hematoma; skin is soft, no pain, level zero. Femoral instructions verbalized and returned verbalization from patient.

## 2015-04-17 NOTE — H&P (View-Only) (Signed)
Cardiology Office Note   Date:  04/15/2015   ID:  Lindsay Rivera, DOB Aug 02, 1960, MRN [id number]  PCP:  Lance Bosch, NP  Cardiologist:   Thayer Headings, MD   Chief Complaint  Patient presents with  . Follow-up    some SOB with activity, some CP no nitro taking for it   Problem list 1. Coronary artery disease- s/p CABG Jan. 2015, [place]  2. Hypertension 3. Hypothyroidism 4. CVA -    History of Present Illness: Lindsay Rivera is a 55 y.o. female who presents for follow up of her CAD and chest wall pain .   Seen with Joycelyn Schmid (significant other)  Still has pain associated with her sternal wires.   Walks frequently  - causes chest pain  Also has chest pain while sitting  Has been using lots of SL NTG  - completely relieves the pain .  She's been seen by Dr. Joya Gaskins who ordered a stress Myoview. The Myoview revealed an mid  anterior defect.  Past Medical History  Diagnosis Date  . Depression   . Bipolar disorder ([place])   . History of hysterectomy   . 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 MI (myocardial infarction) ([place]) 02/08/2013  . Diabetes mellitus without complication ([place])   . Hypertension   . Asthma   . Arthritis   . Anxiety   . GERD (gastroesophageal reflux disease)   . Hyperlipidemia   . Constipation   . CVA (cerebral infarction) 2010  . Hypothyroidism   . Anemia   . CHF (congestive heart failure) ([place])   . Stroke [facility])     Past Surgical History  Procedure Laterality Date  . Coronary artery bypass graft  02/10/2013  . Thyroid surgery  85/2014  . Appendectomy  2008  . Abdominal hysterectomy  2004  . Left knee surgery   1994     Current Outpatient Prescriptions  Medication Sig Dispense Refill  . albuterol (PROVENTIL HFA;VENTOLIN HFA) 108 (90 BASE) MCG/ACT inhaler Inhale 2 puffs into the lungs every 6 (six) hours as needed for wheezing. 3 Inhaler 2  . amLODipine (NORVASC) 5 MG tablet TAKE 1 TABLET BY MOUTH DAILY. 30 tablet 1  . aspirin EC 325 MG tablet  Take 325 mg by mouth daily.    Marland Kitchen atorvastatin (LIPITOR) 40 MG tablet Take 1 tablet (40 mg total) by mouth at bedtime. 30 tablet 12  . buPROPion (WELLBUTRIN XL) 150 MG 24 hr tablet Take 1 tablet (150 mg total) by mouth every evening. 30 tablet 0  . Calcium Carbonate-Vitamin D 600-400 MG-UNIT per chew tablet Chew 1 tablet by mouth daily. Reported on 03/25/2015    . clotrimazole (LOTRIMIN) 1 % cream Apply 1 application topically 2 (two) times daily as needed. Fungus    . diazepam (VALIUM) 5 MG tablet Take 5 mg by mouth 2 (two) times daily as needed for anxiety.    . fluticasone (FLONASE) 50 MCG/ACT nasal spray Place 2 sprays into both nostrils daily. 16 g 3  . isosorbide mononitrate (IMDUR) 30 MG 24 hr tablet Take 2 tablets (60 mg total) by mouth daily. 60 tablet 2  . levothyroxine (SYNTHROID, LEVOTHROID) 112 MCG tablet Take 1 tablet (112 mcg total) by mouth daily. 30 tablet 3  . lidocaine (LIDODERM) 5 % Place 1 patch onto the skin daily as needed (pain). Remove & Discard patch within 12 hours or as directed by MD    . nitroGLYCERIN (NITROSTAT) 0.4 MG SL tablet PLACE  ONE TABLET UNDER THE TONGUE EVERY 5 MINUTES AS NEEDED FOR CHEST PAIN 50 tablet 0  . omeprazole (PRILOSEC) 20 MG capsule TAKE 2 CAPSULES BY MOUTH DAILY 60 capsule 1  . oxyCODONE-acetaminophen (PERCOCET) 7.5-325 MG per tablet Take 1 tablet by mouth every 4 (four) hours as needed for pain. Reported on 03/25/2015    . Polyethylene Glycol 3350 (MIRALAX PO) Take 17 g by mouth daily as needed. Constipation    . propranolol (INDERAL) 20 MG tablet Take 1 tablet (20 mg total) by mouth 2 (two) times daily as needed (anxiety). 60 tablet 0  . QUEtiapine (SEROQUEL) 400 MG tablet Take 600 mg by mouth at bedtime.    Marland Kitchen QUEtiapine (SEROQUEL) 50 MG tablet Take 50 mg by mouth 2 (two) times daily.     No current facility-administered medications for this visit.    Allergies:   Penicillins and Latex    Social History:  The patient  reports that she quit  smoking about 2 years ago. She has never used smokeless tobacco. She reports that she drinks alcohol. She reports that she does not use illicit drugs.   Family History:  The patient's family history includes Cancer in her father; Colon cancer in her father and maternal grandfather; Diabetes in her mother; Heart disease in her father; Hypertension in her father and mother; Stomach cancer in her mother.    ROS:  Please see the history of present illness.    Review of Systems: Constitutional:  denies fever, chills, diaphoresis, appetite change and fatigue.  HEENT: denies photophobia, eye pain, redness, hearing loss, ear pain, congestion, sore throat, rhinorrhea, sneezing, neck pain, neck stiffness and tinnitus.  Respiratory: denies SOB, DOE, cough, chest tightness, and wheezing.  Cardiovascular: denies chest pain, palpitations and leg swelling.  Gastrointestinal: denies nausea, vomiting, abdominal pain, diarrhea, constipation, blood in stool.  Genitourinary: denies dysuria, urgency, frequency, hematuria, flank pain and difficulty urinating.  Musculoskeletal: denies  myalgias, back pain, joint swelling, arthralgias and gait problem.   Skin: denies pallor, rash and wound.  Neurological: denies dizziness, seizures, syncope, weakness, light-headedness, numbness and headaches.   Hematological: denies adenopathy, easy bruising, personal or family bleeding history.  Psychiatric/ Behavioral: denies suicidal ideation, mood changes, confusion, nervousness, sleep disturbance and agitation.       All other systems are reviewed and negative.    PHYSICAL EXAM: VS:  BP 110/72 mmHg  Pulse 66  Ht 5\' 2"  (1.575 m)  Wt 172 lb 12.8 oz (78.382 kg)  BMI 31.60 kg/m2  SpO2 95% , BMI Body mass index is 31.6 kg/(m^2). GEN: Well nourished, well developed, in no acute distress HEENT: normal Neck: no JVD, carotid bruits, or masses Cardiac: RRR; no murmurs, rubs, or gallops,no edema  Respiratory:  clear to  auscultation bilaterally, normal work of breathing GI: soft, nontender, nondistended, + BS MS: no deformity or atrophy Skin: warm and dry, no rash Neuro:  Strength and sensation are intact Psych: normal   EKG:  EKG is ordered today. The ekg ordered today demonstrates NSR at 66.  .  NS ST changes.    Recent Labs: 09/10/2014: ALT 10; BUN 11; Creat 0.92; Potassium 3.5; Sodium 141 01/20/2015: TSH 0.236*    Lipid Panel    Component Value Date/Time   CHOL 179 09/10/2014 1448   TRIG 153* 09/10/2014 1448   HDL 39* 09/10/2014 1448   CHOLHDL 4.6 09/10/2014 1448   VLDL 31* 09/10/2014 1448   LDLCALC 109 09/10/2014 1448      Wt Readings  from Last 3 Encounters:  04/15/15 172 lb 12.8 oz (78.382 kg)  04/06/15 178 lb (80.74 kg)  03/25/15 178 lb 3.2 oz (80.831 kg)      Other studies Reviewed: Additional studies/ records that were reviewed today include: . Review of the above records demonstrates:    ASSESSMENT AND PLAN:  1.  Coronary artery disease: Lindsay Rivera presents for further evaluation of some chest discomfort. She clearly has musculoskeletal/sternal pain but this seems to be different than her episodes of chest pressure. She describes chest pressure both at rest and with exertion. In response to nitroglycerin. She's had a Myoview study which reveals a small mid anterior wall defect. I suspect that this defect is just residual disease following her bypass surgery but she clearly has symptoms that are new since her bypass surgery in 2015.  We discussed the risks, benefits, and options concerning heart cavitation and possible PCI. She understands and agrees to proceed.  See her again in 6 month.  Current medicines are reviewed at length with the patient today.  The patient does not have concerns regarding medicines.  The following changes have been made:  no change  Labs/ tests ordered today include:  No orders of the defined types were placed in this encounter.      Disposition:   FU with me in 6 months       Ayce Pietrzyk, Wonda Cheng, MD  04/15/2015 10:29 AM    [facility] [place], [place], [place]  [address] Phone: [phone]; Fax: [phone]   [facility]  [address] [place],   [address] [phone]   Fax [phone]

## 2015-04-17 NOTE — Progress Notes (Signed)
pts SBP >200; HR <60; PRIOR TO RIGHT FEMORAL SHEATH PULL. BRITTANY, PA PAGED; CALLED BACK ORDERED HYDRALAZINE IVP 10MG  IVP; ALSO GAVE V.O. READ BACK VERIFIED FOR HYDRALAZINE 10MG  IVP 30 MINUTES POST INITIAL ADMINISTRATION FOR SBP >150,  IF NEEDED 2ND DOSE WILL NOTIFY ON CALL PROVIDER PER PA REQUEST. ATROPINE AT BEDSIDE

## 2015-04-17 NOTE — Progress Notes (Signed)
Pt presented with CP 8/10, she stated this is same type of CP that brought her in. CP protocol initiated, Katie PA informed of pt condition.

## 2015-04-17 NOTE — Progress Notes (Signed)
After NTG SL x  3, chest pain now 4/10, pt transferred to Cath Lab holding; BP 82-88/63, IVF increased 733ml/hr

## 2015-04-17 NOTE — Progress Notes (Signed)
PT STATES SHE IS DIABETIC; PA STRADER PAGED FOR ORDERS.

## 2015-04-17 NOTE — Progress Notes (Signed)
Pt's chest pain down to 2. Going into lab. 12 lead done

## 2015-04-18 DIAGNOSIS — I10 Essential (primary) hypertension: Secondary | ICD-10-CM | POA: Diagnosis present

## 2015-04-18 DIAGNOSIS — E785 Hyperlipidemia, unspecified: Secondary | ICD-10-CM

## 2015-04-18 DIAGNOSIS — I25119 Atherosclerotic heart disease of native coronary artery with unspecified angina pectoris: Secondary | ICD-10-CM | POA: Insufficient documentation

## 2015-04-18 LAB — BASIC METABOLIC PANEL
Anion gap: 8 (ref 5–15)
BUN: 11 mg/dL (ref 6–20)
CALCIUM: 8.8 mg/dL — AB (ref 8.9–10.3)
CO2: 28 mmol/L (ref 22–32)
CREATININE: 0.82 mg/dL (ref 0.44–1.00)
Chloride: 108 mmol/L (ref 101–111)
Glucose, Bld: 120 mg/dL — ABNORMAL HIGH (ref 65–99)
Potassium: 2.9 mmol/L — ABNORMAL LOW (ref 3.5–5.1)
SODIUM: 144 mmol/L (ref 135–145)

## 2015-04-18 LAB — CBC
HCT: 33.2 % — ABNORMAL LOW (ref 36.0–46.0)
Hemoglobin: 10.6 g/dL — ABNORMAL LOW (ref 12.0–15.0)
MCH: 26.8 pg (ref 26.0–34.0)
MCHC: 31.9 g/dL (ref 30.0–36.0)
MCV: 83.8 fL (ref 78.0–100.0)
PLATELETS: 296 10*3/uL (ref 150–400)
RBC: 3.96 MIL/uL (ref 3.87–5.11)
RDW: 16.9 % — ABNORMAL HIGH (ref 11.5–15.5)
WBC: 8.4 10*3/uL (ref 4.0–10.5)

## 2015-04-18 LAB — GLUCOSE, CAPILLARY
GLUCOSE-CAPILLARY: 109 mg/dL — AB (ref 65–99)
GLUCOSE-CAPILLARY: 101 mg/dL — AB (ref 65–99)
GLUCOSE-CAPILLARY: 126 mg/dL — AB (ref 65–99)
Glucose-Capillary: 103 mg/dL — ABNORMAL HIGH (ref 65–99)

## 2015-04-18 MED ORDER — POTASSIUM CHLORIDE CRYS ER 20 MEQ PO TBCR
20.0000 meq | EXTENDED_RELEASE_TABLET | Freq: Two times a day (BID) | ORAL | Status: AC
  Administered 2015-04-18 (×2): 20 meq via ORAL
  Filled 2015-04-18 (×2): qty 1

## 2015-04-18 MED ORDER — MORPHINE SULFATE (PF) 2 MG/ML IV SOLN
2.0000 mg | Freq: Once | INTRAVENOUS | Status: AC
  Administered 2015-04-18: 2 mg via INTRAVENOUS
  Filled 2015-04-18: qty 1

## 2015-04-18 MED ORDER — NITROGLYCERIN IN D5W 200-5 MCG/ML-% IV SOLN
INTRAVENOUS | Status: AC
  Administered 2015-04-18: 50000 ug
  Filled 2015-04-18: qty 250

## 2015-04-18 MED ORDER — NITROGLYCERIN IN D5W 200-5 MCG/ML-% IV SOLN
3.0000 ug/min | INTRAVENOUS | Status: DC
  Administered 2015-04-18: 3 ug/min via INTRAVENOUS

## 2015-04-18 MED ORDER — MORPHINE SULFATE (PF) 2 MG/ML IV SOLN
2.0000 mg | INTRAVENOUS | Status: DC | PRN
  Administered 2015-04-18 – 2015-04-21 (×13): 2 mg via INTRAVENOUS
  Filled 2015-04-18 (×13): qty 1

## 2015-04-18 MED ORDER — SODIUM CHLORIDE 0.9 % IV BOLUS (SEPSIS)
250.0000 mL | Freq: Once | INTRAVENOUS | Status: AC
  Administered 2015-04-18: 250 mL via INTRAVENOUS

## 2015-04-18 MED ORDER — AMLODIPINE BESYLATE 2.5 MG PO TABS
2.5000 mg | ORAL_TABLET | Freq: Every day | ORAL | Status: DC
  Administered 2015-04-18 – 2015-04-21 (×4): 2.5 mg via ORAL
  Filled 2015-04-18: qty 1
  Filled 2015-04-18: qty 0.5
  Filled 2015-04-18 (×3): qty 1

## 2015-04-18 NOTE — Progress Notes (Signed)
Pt indicated her chest pain is much better and she does not feel it.  Will continue to monitor.

## 2015-04-18 NOTE — Progress Notes (Signed)
Pt awaken from chest pain. She states the same feeling as this AM chest pain. Pt given 2 mg of Morphine sulfate as PRN . Pt states that her chest pain is subsiding. Will continue to monitor. BP 91/70.

## 2015-04-18 NOTE — Progress Notes (Addendum)
Primary cardiologist: Dr. Mertie Moores  Seen for followup: CAD s/p PCI  Subjective:    Some mild chest discomfort this morning, nonexertional. Has not been out walking around as yet. No shortness of breath at rest. Otherwise slept well.  Objective:   Temp:  [97 F (36.1 C)-98.1 F (36.7 C)] 98.1 F (36.7 C) (03/11 0444) Pulse Rate:  [0-103] 76 (03/11 0444) Resp:  [0-72] 16 (03/11 0444) BP: (85-204)/(49-139) 94/64 mmHg (03/11 0444) SpO2:  [0 %-100 %] 99 % (03/11 0444) Weight:  [172 lb (78.019 kg)-181 lb 10.5 oz (82.4 kg)] 181 lb 10.5 oz (82.4 kg) (03/11 0444) Last BM Date: 04/13/15  Filed Weights   04/17/15 1121 04/18/15 0444  Weight: 172 lb (78.019 kg) 181 lb 10.5 oz (82.4 kg)    Intake/Output Summary (Last 24 hours) at 04/18/15 0749 Last data filed at 04/18/15 0500  Gross per 24 hour  Intake 1562.5 ml  Output   2300 ml  Net -737.5 ml    Telemetry: Sinus rhythm.  Exam:  General: Overweight woman, no distress.  Lungs: Clear, nonlabored.  Cardiac: RRR without gallop.  Abdomen: NABS.  Extremities: No pitting edema.  Lab Results:  Basic Metabolic Panel:  Recent Labs Lab 04/15/15 1128 04/18/15 0455  NA 142 144  K 3.8 2.9*  CL 101 108  CO2 32* 28  GLUCOSE 78 120*  BUN 11 11  CREATININE 0.88 0.82  CALCIUM 10.0 8.8*    CBC:  Recent Labs Lab 04/15/15 1128 04/18/15 0455  WBC 8.3 8.4  HGB 12.4 10.6*  HCT 37.5 33.2*  MCV 84.8 83.8  PLT 418* 296    Coagulation:  Recent Labs Lab 04/15/15 1128  INR 1.02    ECG: I personally reviewed the tracing from 04/18/2015 which showed sinus rhythm with left atrial enlargement and nonspecific ST changes.  Cardiac catheterization 04/17/2015:  Prox RCA lesion, 30% stenosed.  Mid RCA lesion, 60% stenosed.  Dist RCA lesion, 90% stenosed.  SVG was injected is normal in caliber, and is anatomically normal.  RIMA was injected is normal in caliber, and is anatomically normal.  Free RIMA graft  patent to diagonal.  Mid Cx lesion, 50% stenosed.  LIMA was injected .  There is severe disease in the graft.  Atretic LIMA graft which had supplied the LAD.  Mid Graft to Dist Graft lesion, 100% stenosed.  LM lesion, 85% stenosed. Post intervention, there is a 0% residual stenosis.  The left ventricular systolic function is normal.   Medications:   Scheduled Medications: . amLODipine  5 mg Oral Daily  . aspirin EC  81 mg Oral Daily  . atorvastatin  80 mg Oral QHS  . buPROPion  150 mg Oral QPM  . FLUoxetine  20 mg Oral Daily  . fluticasone  2 spray Each Nare Daily  . insulin aspart  0-15 Units Subcutaneous TID WC  . isosorbide mononitrate  60 mg Oral Daily  . levothyroxine  112 mcg Oral QAC breakfast  . pantoprazole  40 mg Oral BID  . QUEtiapine  50 mg Oral BID  . QUEtiapine  600 mg Oral QHS  . sodium chloride flush  3 mL Intravenous Q12H  . ticagrelor  90 mg Oral BID     Infusions: . sodium chloride 100 mL/hr at 04/18/15 0500     PRN Medications:  sodium chloride, acetaminophen, albuterol, diazepam, hydrALAZINE, nitroGLYCERIN, ondansetron (ZOFRAN) IV, propranolol, sodium chloride flush   Assessment:   1. Multivessel CAD status post CABG with  documentation of graft disease at heart catheterization yesterday. LIMA to LAD was atretic and occluded in the midportion, otherwise patent SVG to RCA and patent free RIMA to the diagonal. Now status post DES to 85% left main stenosis. LVEF normal range at angiography with mild mid anterior hypokinesis. LVEF 64% by recent Myoview.  2. Essential hypertension, blood pressure adequately controlled on current regimen..  3. Hyperlipidemia, LDL 114. On high-dose Lipitor.  4. History of stroke, no obvious residua.  5. Hypokalemia, replete potassium.   Plan/Discussion:    Current medical regimen includes aspirin, Brilinta, Imdur, Norvasc, and Lipitor. We will have her walk in the hall with cardiac rehabilitation and see how  she does. Getting nitroglycerin for her chest discomfort, ECG shows nonspecific ST changes. If no further chest pain anticipate discharge home and follow-up with Dr. Acie Fredrickson over the next 2 weeks. Otherwise we will keep and observe overnight. She is already on a long-acting nitrate.   Satira Sark, M.D., F.A.C.C.

## 2015-04-18 NOTE — Progress Notes (Signed)
Called to patient room regarding c/o chest pain.  Patient describes mid sternal, sharp, 5/10.  Dr. Domenic Polite and Kerin Ransom, PA, aware.  Patient given one SL NTG with minimal relief, and which dropped SBP into the 80s.  Daily meds given per orders, and NTG infusion begun.  Patient also having mild SOB, oxygen per [place] remains on; given caffeine for possible Brilinta affect.  Patient kept NPO except for liquids since 0800 in case return to cath lab is called for.  Transferred to [id number].

## 2015-04-18 NOTE — Progress Notes (Signed)
Pt c/o of 5/10 chest pain. Placed on O2 at @LPM  via nasal cannula. Kerin Ransom aware. Requested for Morphine IV. Medication given .

## 2015-04-18 NOTE — Progress Notes (Addendum)
Called to see by RN. Pt still having c/p. Sl NTG helped- "from 5 to 4". She looks comfortable. Her EKG did show TWI in lead  V3 not seen on previous EKG. Her BP is table- 115/78. Try low dose IV NTG.  Gage Weant PA-C 04/18/2015 9:44 AM

## 2015-04-18 NOTE — Progress Notes (Signed)
Pt declines to speak with cardiac rehab staff due to complaining of chest pain 4/10.   This is down from 5/10 and pt received a ntg.Pt is on her side lying in the bed.  Pt reports it feels like an elephant sitting on my chest.  Pt describes this pain as similar to what brought her to the hospital. Rechecked bp 85/53  Primary RN Clair Gulling notified.  12 lead ekg to be completed.  Will follow later today when appropriate. Cherre Huger, BSN

## 2015-04-18 NOTE — Progress Notes (Signed)
Pt transferred to 3W29.  Pt asleep in bed awaken easily.  Pt denies any chest pain at this time. Pt reluctant to ambulate with rehab staff due to receiving "pain medication" and can not keep eyes open to walk.  Pt given information regarding heart healthy eating with DM modifications, activity guidelines and restrictions, importance of anti-platelet therapy and compliance, NTG protocol and to alert 911 for any unresolved chest pain.  Pt dozing intermittently but would wake up to verify information.  Pt agreed to have contact information sent to [facility] outpatient cardiac rehab.  Pt is attend a phase II program in [place]  prior after her heart bypass surgery in 2015.  Pt indicated that she has an "orange card" and she received a letter in January stating 100% discount from [facility]. Handouts secured with pt stent card and placed in her belonging bag.  Advised pt that it would not be safe to ambulate now but pt should ambulate with nursing staff to determine if CP will reoccur on exertion.  Pt nodded as she drifted back to sleep. Cherre Huger, BSN [phone]

## 2015-04-18 NOTE — Progress Notes (Signed)
Pt currently fast asleep and mildly snoring. Did not respond to light tapping of her shoulder to asses if her chest pain continues. Will continue to  Monitor.

## 2015-04-19 ENCOUNTER — Encounter (HOSPITAL_COMMUNITY): Payer: Self-pay | Admitting: Radiology

## 2015-04-19 ENCOUNTER — Ambulatory Visit (HOSPITAL_COMMUNITY): Payer: MEDICAID

## 2015-04-19 ENCOUNTER — Ambulatory Visit (HOSPITAL_COMMUNITY): Payer: Self-pay

## 2015-04-19 DIAGNOSIS — R079 Chest pain, unspecified: Secondary | ICD-10-CM

## 2015-04-19 DIAGNOSIS — R931 Abnormal findings on diagnostic imaging of heart and coronary circulation: Secondary | ICD-10-CM | POA: Diagnosis present

## 2015-04-19 LAB — CBC
HEMATOCRIT: 32.9 % — AB (ref 36.0–46.0)
Hemoglobin: 10.3 g/dL — ABNORMAL LOW (ref 12.0–15.0)
MCH: 26.6 pg (ref 26.0–34.0)
MCHC: 31.3 g/dL (ref 30.0–36.0)
MCV: 85 fL (ref 78.0–100.0)
PLATELETS: 286 10*3/uL (ref 150–400)
RBC: 3.87 MIL/uL (ref 3.87–5.11)
RDW: 17.3 % — ABNORMAL HIGH (ref 11.5–15.5)
WBC: 7.2 10*3/uL (ref 4.0–10.5)

## 2015-04-19 LAB — BASIC METABOLIC PANEL
Anion gap: 8 (ref 5–15)
BUN: 10 mg/dL (ref 6–20)
CO2: 28 mmol/L (ref 22–32)
Calcium: 9 mg/dL (ref 8.9–10.3)
Chloride: 106 mmol/L (ref 101–111)
Creatinine, Ser: 0.74 mg/dL (ref 0.44–1.00)
GFR calc Af Amer: >60 mL/min (ref >60)
GFR calc non Af Amer: >60 mL/min (ref >60)
Glucose, Bld: 108 mg/dL — ABNORMAL HIGH (ref 65–99)
Potassium: 3.5 mmol/L (ref 3.5–5.1)
Sodium: 142 mmol/L (ref 135–145)

## 2015-04-19 LAB — GLUCOSE, CAPILLARY
GLUCOSE-CAPILLARY: 94 mg/dL (ref 65–99)
GLUCOSE-CAPILLARY: 98 mg/dL (ref 65–99)
GLUCOSE-CAPILLARY: 109 mg/dL — AB (ref 65–99)
Glucose-Capillary: 134 mg/dL — ABNORMAL HIGH (ref 65–99)

## 2015-04-19 LAB — TROPONIN I
Troponin I: <0.03 ng/mL (ref <0.031)
Troponin I: <0.03 ng/mL (ref <0.031)

## 2015-04-19 LAB — D-DIMER, QUANTITATIVE (NOT AT ARMC): D DIMER QUANT: 0.65 ug{FEU}/mL — AB (ref 0.00–0.50)

## 2015-04-19 MED ORDER — HEPARIN SODIUM (PORCINE) 5000 UNIT/ML IJ SOLN
5000.0000 [IU] | Freq: Three times a day (TID) | INTRAMUSCULAR | Status: DC
  Administered 2015-04-19 – 2015-04-21 (×6): 5000 [IU] via SUBCUTANEOUS
  Filled 2015-04-19 (×6): qty 1

## 2015-04-19 MED ORDER — SENNOSIDES-DOCUSATE SODIUM 8.6-50 MG PO TABS
1.0000 | ORAL_TABLET | Freq: Every evening | ORAL | Status: DC | PRN
  Administered 2015-04-19 – 2015-04-20 (×2): 2 via ORAL
  Filled 2015-04-19 (×2): qty 2

## 2015-04-19 MED ORDER — IOHEXOL 350 MG/ML SOLN
80.0000 mL | Freq: Once | INTRAVENOUS | Status: AC | PRN
  Administered 2015-04-19: 15:00:00 via INTRAVENOUS

## 2015-04-19 MED ORDER — ISOSORBIDE MONONITRATE ER 30 MG PO TB24
30.0000 mg | ORAL_TABLET | Freq: Every day | ORAL | Status: DC
  Administered 2015-04-19 – 2015-04-21 (×3): 30 mg via ORAL
  Filled 2015-04-19 (×3): qty 1

## 2015-04-19 NOTE — Progress Notes (Signed)
Pt continues to c/o CP 6/10.  Describes as a sharp, stabbing pain.  Dr. Eula Fried (Cards Fellow) notifed.  Orders to d/c Ntg gtt, give 250cc Bolus, and cycle troponins acknowledged.  Morphine 2mg  IV given.  Pt states the morphine helps a little but then the came comes right back.  Will Continue to monitor.

## 2015-04-19 NOTE — Progress Notes (Signed)
    Subjective:  Pt continues to have chest pain. "Sharp" mid sternal  Objective:  Vital Signs in the last 24 hours: Temp:  [97.7 F (36.5 C)-98.3 F (36.8 C)] 98.3 F (36.8 C) (03/12 0431) Pulse Rate:  [68-86] 81 (03/12 0635) Resp:  [16-20] 18 (03/12 0635) BP: (86-145)/(40-79) 98/56 mmHg (03/12 0703) SpO2:  [96 %-100 %] 100 % (03/12 0635) FiO2 (%):  [2 %] 2 % (03/11 1012) Weight:  [177 lb 14.4 oz (80.695 kg)] 177 lb 14.4 oz (80.695 kg) (03/12 0431)  Intake/Output from previous day:  Intake/Output Summary (Last 24 hours) at 04/19/15 0737 Last data filed at 04/18/15 0816  Gross per 24 hour  Intake    120 ml  Output      0 ml  Net    120 ml    Physical Exam: General appearance: alert, cooperative, no distress and moderately obese Lungs: clear to auscultation bilaterally Heart: regular rate and rhythm Extremities: no edema Neurologic: Grossly normal   Rate: 78  Rhythm: normal sinus rhythm  Lab Results:  Recent Labs  04/18/15 0455 04/19/15 0546  WBC 8.4 7.2  HGB 10.6* 10.3*  PLT 296 286    Recent Labs  04/18/15 0455  NA 144  K 2.9*  CL 108  CO2 28  GLUCOSE 120*  BUN 11  CREATININE 0.82    Recent Labs  04/18/15 2335 04/19/15 0546  TROPONINI <0.03 <0.03   No results for input(s): INR in the last 72 hours.  Scheduled Meds: . amLODipine  2.5 mg Oral Daily  . aspirin EC  81 mg Oral Daily  . atorvastatin  80 mg Oral QHS  . buPROPion  150 mg Oral QPM  . FLUoxetine  20 mg Oral Daily  . fluticasone  2 spray Each Nare Daily  . insulin aspart  0-15 Units Subcutaneous TID WC  . levothyroxine  112 mcg Oral QAC breakfast  . pantoprazole  40 mg Oral BID  . QUEtiapine  50 mg Oral BID  . QUEtiapine  600 mg Oral QHS  . sodium chloride flush  3 mL Intravenous Q12H  . ticagrelor  90 mg Oral BID   Continuous Infusions: . sodium chloride 100 mL/hr at 04/18/15 0500   PRN Meds:.sodium chloride, acetaminophen, albuterol, diazepam, hydrALAZINE, morphine  injection, nitroGLYCERIN, ondansetron (ZOFRAN) IV, propranolol, sodium chloride flush   Imaging: No results found.   Assessment/Plan:  55 y.o. female with a history of CABG x 3 in [place] 2015. She has had chronic chest pain since CABG. Recently she had chest pain suspicious for angina. OP Myoview showed small ant/lat defect. Pt admitted for cath 04/17/15. She had an 85% LM stenosis that was treated with DES. She continues to complain of pain- Troponin normal.   Principal Problem:   CAD involving native coronary artery of native heart with [place] Active Problems:   Chest pain   CAD -S/P LM DES 04/17/15   Abnormal nuclear cardiac imaging test   CAD- CABG x 09 Feb 2013 ([place])   Essential hypertension   Obesity- BMI 31   Dyslipidemia   Hypothyroid   Bipolar disorder, unspecified ([place])   Hyperlipidemia   PLAN: She continues to have chest pain. She has subtle EKG changes but Troponin remains negative. Her symptoms do not sound anginal to me.   [employer] PA-C 04/19/2015, 7:37 AM [phone]

## 2015-04-20 ENCOUNTER — Encounter (HOSPITAL_COMMUNITY): Payer: Self-pay | Admitting: Cardiovascular Disease

## 2015-04-20 DIAGNOSIS — F319 Bipolar disorder, unspecified: Secondary | ICD-10-CM

## 2015-04-20 DIAGNOSIS — I2510 Atherosclerotic heart disease of native coronary artery without angina pectoris: Secondary | ICD-10-CM | POA: Insufficient documentation

## 2015-04-20 DIAGNOSIS — R0789 Other chest pain: Secondary | ICD-10-CM

## 2015-04-20 LAB — GLUCOSE, CAPILLARY
Glucose-Capillary: 126 mg/dL — ABNORMAL HIGH (ref 65–99)
Glucose-Capillary: 140 mg/dL — ABNORMAL HIGH (ref 65–99)
Glucose-Capillary: 100 mg/dL — ABNORMAL HIGH (ref 65–99)
Glucose-Capillary: 114 mg/dL — ABNORMAL HIGH (ref 65–99)

## 2015-04-20 LAB — BRAIN NATRIURETIC PEPTIDE: B Natriuretic Peptide: 92.3 pg/mL (ref 0.0–100.0)

## 2015-04-20 MED ORDER — BISACODYL 10 MG RE SUPP
10.0000 mg | Freq: Every day | RECTAL | Status: DC | PRN
  Administered 2015-04-20: 10 mg via RECTAL
  Filled 2015-04-20: qty 1

## 2015-04-20 MED ORDER — POTASSIUM CHLORIDE CRYS ER 20 MEQ PO TBCR
40.0000 meq | EXTENDED_RELEASE_TABLET | Freq: Once | ORAL | Status: AC
  Administered 2015-04-20: 40 meq via ORAL
  Filled 2015-04-20: qty 2

## 2015-04-20 NOTE — Progress Notes (Signed)
Subjective:  She says she still has localized chest pain. She says she needs O2- can't walk to the BR without it. Not on O2 at home.   Objective:  Vital Signs in the last 24 hours: Temp:  [97.2 F (36.2 C)-98.5 F (36.9 C)] 97.9 F (36.6 C) (03/13 0500) Pulse Rate:  [74-85] 74 (03/13 0500) Resp:  [13-20] 13 (03/13 0040) BP: (91-132)/(45-93) 125/67 mmHg (03/13 0500) SpO2:  [99 %-100 %] 100 % (03/13 0500) Weight:  [177 lb 14.4 oz (80.695 kg)] 177 lb 14.4 oz (80.695 kg) (03/13 0500)  Intake/Output from previous day:  Intake/Output Summary (Last 24 hours) at 04/20/15 0753 Last data filed at 04/20/15 0620  Gross per 24 hour  Intake   1880 ml  Output    625 ml  Net   1255 ml    Physical Exam: General appearance: alert, cooperative, no distress and moderately obese Lungs: decreased breath sounds  Heart: regular rate and rhythm Extremities: no edema Skin: warm and dry Neurologic: Grossly normal   Rate: 74  Rhythm: normal sinus rhythm  Lab Results:  Recent Labs  04/18/15 0455 04/19/15 0546  WBC 8.4 7.2  HGB 10.6* 10.3*  PLT 296 286    Recent Labs  04/18/15 0455 04/19/15 1111  NA 144 142  K 2.9* 3.5  CL 108 106  CO2 28 28  GLUCOSE 120* 108*  BUN 11 10  CREATININE 0.82 0.74    Recent Labs  04/19/15 0546 04/19/15 1111  TROPONINI <0.03 <0.03   No results for input(s): INR in the last 72 hours.  Scheduled Meds: . amLODipine  2.5 mg Oral Daily  . aspirin EC  81 mg Oral Daily  . atorvastatin  80 mg Oral QHS  . buPROPion  150 mg Oral QPM  . FLUoxetine  20 mg Oral Daily  . fluticasone  2 spray Each Nare Daily  . heparin subcutaneous  5,000 Units Subcutaneous 3 times per day  . insulin aspart  0-15 Units Subcutaneous TID WC  . isosorbide mononitrate  30 mg Oral Daily  . levothyroxine  112 mcg Oral QAC breakfast  . pantoprazole  40 mg Oral BID  . potassium chloride  40 mEq Oral Once  . QUEtiapine  50 mg Oral BID  . QUEtiapine  600 mg Oral QHS  .  sodium chloride flush  3 mL Intravenous Q12H  . ticagrelor  90 mg Oral BID   Continuous Infusions: . sodium chloride 100 mL/hr at 04/18/15 0500   PRN Meds:.sodium chloride, acetaminophen, albuterol, diazepam, hydrALAZINE, morphine injection, nitroGLYCERIN, ondansetron (ZOFRAN) IV, propranolol, senna-docusate, sodium chloride flush   Imaging: Dg Chest 2 View  04/19/2015  CLINICAL DATA:  Mid chest pain off and on for 2 days. Coronary stent placed 2 days ago. Shortness of breath. EXAM: CHEST  2 VIEW COMPARISON:  03/31/2014 FINDINGS: Prior CABG. Mild cardiomegaly. Linear densities in the right middle lobe, likely atelectasis. Left lung is clear. No effusions or acute bony abnormality. IMPRESSION: Right basilar atelectasis.  Mild cardiomegaly. Electronically Signed   By: Rolm Baptise M.D.   On: 04/19/2015 12:12   Ct Angio Chest Pe W/cm &/or Wo Cm  04/19/2015  CLINICAL DATA:  Pleuritic chest pain x2 days EXAM: CT ANGIOGRAPHY CHEST WITH CONTRAST  IMPRESSION: No evidence of pulmonary embolism. No evidence of acute cardiopulmonary disease. Electronically Signed   By: Julian Hy M.D.   On: 04/19/2015 15:37    Cardiac Studies: Cath 04/17/15  Prox RCA lesion, 30% stenosed.  Mid RCA lesion, 60% stenosed.  Dist RCA lesion, 90% stenosed.  SVG was injected is normal in caliber, and is anatomically normal.  RIMA was injected is normal in caliber, and is anatomically normal.  Free RIMA graft patent to diagonal.  Mid Cx lesion, 50% stenosed.  LIMA was injected .  There is severe disease in the graft.  Atretic LIMA graft which had supplied the LAD.  Mid Graft to Dist Graft lesion, 100% stenosed.  LM lesion, 85% stenosed. Post intervention, there is a 0% residual stenosis.  The left ventricular systolic function is normal.   Assessment/Plan:  55 y.o. female with a history of CABG x 3 in [place] 2015. She has had chronic chest pain since CABG. Recently she had chest pain suspicious for  angina. OP Myoview showed small ant/lat defect. Pt admitted for cath 04/17/15. She had an 85% LM stenosis that was treated with DES. She continues to complain of pain (atypical) and SOB- Troponin normal.   Principal Problem:   CAD involving native coronary artery of native heart with [place] Active Problems:   Chest pain   CAD -S/P LM DES 04/17/15   Abnormal nuclear cardiac imaging test   CAD- CABG x 09 Feb 2013 ([place])   Essential hypertension   Obesity- BMI 31   Dyslipidemia   Hypothyroid   Bipolar disorder, unspecified ([place])   Hyperlipidemia   PLAN: Check BNP (though no CHF noted on CXR or CT). Check RA O2 sats.   Kerin Ransom PA-C 04/20/2015, 7:53 AM [phone]  Attending Note:   The patient was seen and examined.  Agree with assessment and plan as noted above.  Changes made to the above note as needed.  She has ambulated 240 feet.  sats stayed above 96%  Still having chronic cp  - has had for years  Now having a different type of chest pain  Worse with movement  Worse with twising and turning her torso. Ct angio yesterday was negative for PE and for any dissection or vascular issue.  She thinks that she is not feeling well enough to go home today . Will get her home tomorrow   Ambulated with cardiac rehab without any problems    Thayer Headings, Brooke Bonito., MD, [facility] 04/20/2015, 1:52 PM [address]  [place] Pager [phone]

## 2015-04-20 NOTE — Plan of Care (Signed)
Problem: Education: Goal: Knowledge of [facility] Education information/materials will improve Outcome: Progressing Patient aware of plan of care, states understanding.  RN provided medication education to patient on all medications administered thus far this shift.  Patient stated understanding.  RN instructed patient to call and wait for staff assistance prior to getting out of bed.  Patient stated understanding.

## 2015-04-20 NOTE — Progress Notes (Signed)
CARDIAC REHAB PHASE I   PRE:  Rate/Rhythm: 95 SR  BP:  Sitting: 137/84        SaO2: 100 RA  MODE:  Ambulation: 240 ft   POST:  Rate/Rhythm: 96 SR  BP:  Sitting: 137/97         SaO2: 96 RA  Pt lying in bed, c/o 4/10 chest discomfort. Pt ambulated 240 ft on RA, rolling walker (pt states she uses a walker at home), assist x1, slow, steady gait, tolerated fair. Pt c/o dizziness with ambulation, DOE (sats 100% on RA), brief standing rest x4. Denies worsening chest pain with activity. VSS. Pt states she has not received 30-day free brilinta card (pt does not have insurance), RN aware, RN states she will notify case Freight forwarder. Pt states she has no other questions regarding education at this time. Phase 2 cardiac rehab referral sent to [facility]. Pt to bed per pt request after walk, call bell within reach. Will follow.   [id number] Lenna Sciara, RN, BSN 04/20/2015 8:59 AM

## 2015-04-20 NOTE — Progress Notes (Addendum)
UR Completed Tahje Borawski Graves-Bigelow, RN,BSN [phone]  

## 2015-04-20 NOTE — Care Management Note (Signed)
Case Management Note  Patient Details  Name: Lindsay Rivera MRN: [id number] Date of Birth: 07-Apr-1960  Subjective/Objective:  Pt admitted for CP- stent placement and increasing cp post procedure. Pt initiated on Brilinta. Pt is without insurance. CM did provide pt with 30 day free card. Pt will need Rx to go along with card- no refills and the original Rx with refills. Pt can get medications from the [facility] and [facility]. Brilinta is in stock.                   Action/Plan: Patient Assistance form left in [facility] Chart- MD please fill out. Staff RN to return to patient before d/c. Unit Secretary to schedule a hospital f/u for the [facility]. No further needs from CM at this time.     Expected Discharge Date:                  Expected Discharge Plan:  Home/Self Care  In-House Referral:  NA  Discharge planning Services  CM Consult, Follow-up appt scheduled, [facility], Medication Assistance  Post Acute Care Choice:  NA Choice offered to:  NA  DME Arranged:  N/A DME Agency:  NA  HH Arranged:  NA HH Agency:  NA  Status of Service:  Completed, signed off  Medicare Important Message Given:    Date Medicare IM Given:    Medicare IM give by:    Date Additional Medicare IM Given:    Additional Medicare Important Message give by:     If discussed at [place] of Stay Meetings, dates discussed:    Additional Comments:  Bethena Roys, RN 04/20/2015, 11:28 AM

## 2015-04-21 LAB — GLUCOSE, CAPILLARY: GLUCOSE-CAPILLARY: 116 mg/dL — AB (ref 65–99)

## 2015-04-21 MED ORDER — AMLODIPINE BESYLATE 2.5 MG PO TABS: 2.5000 mg | ORAL_TABLET | Freq: Every day | ORAL | Status: DC

## 2015-04-21 MED ORDER — ACETAMINOPHEN 325 MG PO TABS: 650.0000 mg | ORAL_TABLET | ORAL | Status: DC | PRN

## 2015-04-21 MED ORDER — ISOSORBIDE MONONITRATE ER 30 MG PO TB24: 30.0000 mg | ORAL_TABLET | Freq: Every day | ORAL | Status: DC

## 2015-04-21 MED ORDER — TICAGRELOR 90 MG PO TABS: 90.0000 mg | ORAL_TABLET | Freq: Two times a day (BID) | ORAL | Status: DC

## 2015-04-21 MED FILL — AMLODIPINE BESYLATE 2.5 MG: 2.5 | 30 days supply | Qty: 30 | Fill #0

## 2015-04-21 MED FILL — BRILINTA 90 MG TABLET: 90 | 30 days supply | Qty: 60 | Fill #0

## 2015-04-21 NOTE — Discharge Summary (Signed)
Discharge Summary    Patient ID: Lindsay Rivera,  MRN: [id number], DOB/AGE: 1960-10-12 55 y.o.  Admit date: 04/17/2015 Discharge date: 04/21/2015  Primary Care Provider: Lance Bosch Primary Cardiologist: Dr Acie Fredrickson  Discharge Diagnoses    Principal Problem:   CAD involving native coronary artery of native heart with [place] Active Problems:   Chest pain   CAD -S/P LM DES 04/17/15   Abnormal nuclear cardiac imaging test   CAD- CABG x 09 Feb 2013 ([place])   Essential hypertension   Obesity- BMI 31   Dyslipidemia   Hypothyroid   Bipolar disorder, unspecified (H[facility])   Hyperlipidemia   Allergies Allergies  Allergen Reactions  . Penicillins Swelling    Has patient had a PCN reaction causing immediate rash, facial/tongue/throat swelling, SOB or lightheadedness with hypotension: No Has patient had a PCN reaction causing severe rash involving mucus membranes or skin necrosis: No Has patient had a PCN reaction that required hospitalization No Has patient had a PCN reaction occurring within the last 10 years: No If all of the above answers are "NO", then may proceed with Cephalosporin use.   . Latex Rash    Diagnostic Studies/Procedures    04/17/15- Cath/ LM DES 04/19/15 Chest CTA _____________   History of Present Illness     55 y/o female, s/p CABG 2015 with chronic chest pain, admitted for cath after an [facility]      54 y.o. female with a history of CABG x 3 in [place] Jan 2015. She has had chronic chest pain since her CABG. Recently she had chest pain that was suspicious for angina. An OP Myoview 04/06/15 showed a small ant/lat defect. Pt was admitted for cath 04/17/15. She had an 85% LM stenosis that was treated with DES. The LIMA-LAD was atretic and 100% occluded mid graft. The free RIMA to Dx was patent, the SVG-RCA was patent. LVF was normal at cath. She continued to complain of pain (atypical) and SOB post PCI although her symptoms after her PCI sounded more  M/S.  Troponin was normal, O2 sats normal on RA, CTA negative for PE. Pt was seen by Dr Acie Fredrickson 04/21/15 and felt she was stable for discharge.    _____________  Discharge Vitals Blood pressure 150/85, pulse 91, temperature 97.5 F (36.4 C), temperature source Oral, resp. rate 16, height 5\' 2"  (1.575 m), weight 175 lb (79.379 kg), SpO2 100 %.  Filed Weights   04/19/15 0431 04/20/15 0500 04/21/15 0500  Weight: 177 lb 14.4 oz (80.695 kg) 177 lb 14.4 oz (80.695 kg) 175 lb (79.379 kg)    Labs & Radiologic Studies     CBC  Recent Labs  04/19/15 0546  WBC 7.2  HGB 10.3*  HCT 32.9*  MCV 85.0  PLT [id number]   Basic Metabolic Panel  Recent Labs  04/19/15 1111  NA 142  K 3.5  CL 106  CO2 28  GLUCOSE 108*  BUN 10  CREATININE 0.74  CALCIUM 9.0   Liver Function Tests No results for input(s): AST, ALT, ALKPHOS, BILITOT, PROT, ALBUMIN in the last 72 hours. No results for input(s): LIPASE, AMYLASE in the last 72 hours. Cardiac Enzymes  Recent Labs  04/18/15 2335 04/19/15 0546 04/19/15 1111  TROPONINI <0.03 <0.03 <0.03   BNP Invalid input(s): POCBNP D-Dimer  Recent Labs  04/19/15 1117  DDIMER 0.65*   Hemoglobin A1C No results for input(s): HGBA1C in the last 72 hours. Fasting Lipid Panel No results for  input(s): CHOL, HDL, LDLCALC, TRIG, CHOLHDL, LDLDIRECT in the last 72 hours. Thyroid Function Tests No results for input(s): TSH, T4TOTAL, T3FREE, THYROIDAB in the last 72 hours.  Invalid input(s): FREET3  Dg Chest 2 View  04/19/2015  CLINICAL DATA:  Mid chest pain off and on for 2 days. Coronary stent placed 2 days ago. Shortness of breath. EXAM: CHEST  2 VIEW COMPARISON:  03/31/2014 FINDINGS: Prior CABG. Mild cardiomegaly. Linear densities in the right middle lobe, likely atelectasis. Left lung is clear. No effusions or acute bony abnormality. IMPRESSION: Right basilar atelectasis.  Mild cardiomegaly. Electronically Signed   By: Rolm Baptise M.D.   On: 04/19/2015 12:12    Ct Angio Chest Pe W/cm &/or Wo Cm  04/19/2015  CLINICAL DATA:  Pleuritic chest pain x2 days EXAM: CT ANGIOGRAPHY CHEST WITH CONTRAST TECHNIQUE: Multidetector CT imaging of the chest was performed using the standard protocol during bolus administration of intravenous contrast. Multiplanar CT image reconstructions and MIPs were obtained to evaluate the vascular anatomy. CONTRAST:  80 mL Omnipaque 350 IV COMPARISON:  Chest radiographs dated 04/19/2015. CT chest dated 04/02/2014. FINDINGS: No evidence of pulmonary embolism. Although not tailored for evaluation of the thoracic aorta, there is no evidence of thoracic aortic aneurysm or dissection. Mediastinum/Nodes: Cardiomegaly.  No pericardial effusion. Coronary atherosclerosis in the LAD. Postsurgical changes related to prior CABG. Mild atherosclerotic calcifications of the aortic arch. Small mediastinal lymph nodes which do not meet pathologic CT size criteria. Lungs/Pleura: Evaluation of the lung parenchyma is constrained by respiratory motion. Linear scarring in the left upper lobe. Mild scarring/ atelectasis in the right middle lobe. Mild dependent atelectasis in the bilateral lower lobes. No focal consolidation. No suspicious pulmonary nodules. No pleural effusion or pneumothorax. Upper abdomen: Visualized upper abdomen is unremarkable. Musculoskeletal: Degenerative changes of the visualized thoracolumbar spine. Median sternotomy. No rib fracture is seen. Review of the MIP images confirms the above findings. IMPRESSION: No evidence of pulmonary embolism. No evidence of acute cardiopulmonary disease. Electronically Signed   By: Julian Hy M.D.   On: 04/19/2015 15:37    Disposition   Pt is being discharged home today in good condition.  Follow-up Plans & Appointments    Follow-up Information    Follow up with Lance Bosch, NP. Go on 04/30/2015.   Specialty:  Internal Medicine   Why:  Hospital follow up @ 4pm   Contact information:   [place] [address] [phone]       Follow up with Nahser, Wonda Cheng, MD.   Specialty:  Cardiology   Why:  office will contact you   Contact information:   [address] [place] [address] [phone]      Discharge Instructions    Amb Referral to Cardiac Rehabilitation    Complete by:  As directed   Diagnosis:  PCI           Discharge Medications   Current Discharge Medication List    START taking these medications   Details  acetaminophen (TYLENOL) 325 MG tablet Take 2 tablets (650 mg total) by mouth every 4 (four) hours as needed for headache or mild pain.    ticagrelor (BRILINTA) 90 MG TABS tablet Take 1 tablet (90 mg total) by mouth 2 (two) times daily. Qty: 60 tablet, Refills: 11      CONTINUE these medications which have CHANGED   Details  amLODipine (NORVASC) 2.5 MG tablet Take 1 tablet (2.5 mg total) by mouth  daily. Qty: 90 tablet, Refills: 3    isosorbide mononitrate (IMDUR) 30 MG 24 hr tablet Take 1 tablet (30 mg total) by mouth daily. Qty: 60 tablet, Refills: 2      CONTINUE these medications which have NOT CHANGED   Details  albuterol (PROVENTIL HFA;VENTOLIN HFA) 108 (90 BASE) MCG/ACT inhaler Inhale 2 puffs into the lungs every 6 (six) hours as needed for wheezing. Qty: 3 Inhaler, Refills: 2    aspirin EC 81 MG tablet Take 1 tablet (81 mg total) by mouth daily.    atorvastatin (LIPITOR) 80 MG tablet Take 1 tablet (80 mg total) by mouth at bedtime. Qty: 30 tablet, Refills: 11    buPROPion (WELLBUTRIN XL) 150 MG 24 hr tablet Take 1 tablet (150 mg total) by mouth every evening. Qty: 30 tablet, Refills: 0    Calcium Carbonate-Vitamin D 600-400 MG-UNIT per chew tablet Chew 1 tablet by mouth daily. Reported on 03/25/2015    clotrimazole (LOTRIMIN) 1 % cream Apply 1 application topically 2 (two) times daily as needed. Fungus    diazepam (VALIUM) 5 MG tablet Take 5 mg by mouth 2 (two) times daily as needed for anxiety.      FLUoxetine (PROZAC) 20 MG tablet Take 20 mg by mouth daily. Refills: 2    fluticasone (FLONASE) 50 MCG/ACT nasal spray Place 2 sprays into both nostrils daily. Qty: 16 g, Refills: 3   Associated Diagnoses: Nasal congestion    levothyroxine (SYNTHROID, LEVOTHROID) 112 MCG tablet Take 1 tablet (112 mcg total) by mouth daily. Qty: 30 tablet, Refills: 3   Associated Diagnoses: Thyroid disease    NITROSTAT 0.4 MG SL tablet PLACE 1 TABLET UNDER THE TONGUE EVERY 5 MINS AS NEEDED FOR CHEST PAIN Qty: 25 tablet, Refills: 0    oxyCODONE-acetaminophen (PERCOCET) 7.5-325 MG per tablet Take 1 tablet by mouth every 4 (four) hours as needed for pain. Reported on 03/25/2015    pantoprazole (PROTONIX) 40 MG tablet Take 40 mg by mouth 2 (two) times daily. Refills: 4    Polyethylene Glycol 3350 (MIRALAX PO) Take 17 g by mouth daily as needed. Constipation    propranolol (INDERAL) 20 MG tablet Take 1 tablet (20 mg total) by mouth 2 (two) times daily as needed (anxiety). Qty: 60 tablet, Refills: 0    !! QUEtiapine (SEROQUEL) 400 MG tablet Take 600 mg by mouth at bedtime.    !! QUEtiapine (SEROQUEL) 50 MG tablet Take 50 mg by mouth 2 (two) times daily.    lidocaine (LIDODERM) 5 % Place 1 patch onto the skin daily as needed (pain). Remove & Discard patch within 12 hours or as directed by MD     !! - Potential duplicate medications found. Please discuss with provider.       Aspirin prescribed at discharge?  Yes High Intensity Statin Prescribed? (Lipitor 40-80mg  or Crestor 20-40mg ): Yes Beta Blocker Prescribed? Yes For EF 45% or less, Was ACEI/ARB Prescribed? No: NA ADP Receptor Inhibitor Prescribed? (i.e. Plavix etc.-Includes Medically Managed Patients): Yes For EF <40%, Aldosterone Inhibitor Prescribed? No: NA Was EF assessed during THIS hospitalization? Yes Was Cardiac Rehab II ordered? (Included Medically managed Patients): Yes   Outstanding Labs/Studies    Duration of Discharge Encounter    Greater than 30 minutes including physician time.  Angelena Form K PA 04/21/2015, 10:40 AM   Attending Note:   The patient was seen and examined.  Agree with assessment and plan as noted above.  Changes made to the above note as needed.  Pt had stenting of the LAD. Did well  had some noncardiac CP the day after the procedure. CT scan showed on PE, no dissection or aneurism.   Has chronic pain for the past 2 years   Feels better this am Ready to go home   Thayer Headings, Brooke Bonito., MD, [facility] 04/21/2015, 4:57 PM [address]  [place] Pager [phone]

## 2015-04-21 NOTE — Plan of Care (Signed)
Problem: Activity: Goal: Ability to return to baseline activity level will improve Outcome: Completed/Met Date Met:  04/21/15 Pt ambulating around the room.

## 2015-04-21 NOTE — Progress Notes (Signed)
Subjective:  She says she still has localized chest pain. "I don't feel good"   Objective:  Vital Signs in the last 24 hours: Temp:  [97.5 F (36.4 C)-98.3 F (36.8 C)] 97.5 F (36.4 C) (03/14 0500) Pulse Rate:  [83-91] 91 (03/14 0500) Resp:  [16-20] 16 (03/13 2105) BP: (130-150)/(83-85) 150/85 mmHg (03/14 0500) SpO2:  [100 %] 100 % (03/14 0500) Weight:  [175 lb (79.379 kg)] 175 lb (79.379 kg) (03/14 0500)  Intake/Output from previous day:  Intake/Output Summary (Last 24 hours) at 04/21/15 0744 Last data filed at 04/21/15 0658  Gross per 24 hour  Intake    120 ml  Output   1200 ml  Net  -1080 ml    Physical Exam: General appearance: alert, cooperative, no distress and moderately obese Lungs: decreased breath sounds  Heart: regular rate and rhythm Extremities: no edema Skin: warm and dry Neurologic: Grossly normal   Rate: 74  Rhythm: normal sinus rhythm  Lab Results:  Recent Labs  04/19/15 0546  WBC 7.2  HGB 10.3*  PLT 286    Recent Labs  04/19/15 1111  NA 142  K 3.5  CL 106  CO2 28  GLUCOSE 108*  BUN 10  CREATININE 0.74    Recent Labs  04/19/15 0546 04/19/15 1111  TROPONINI <0.03 <0.03   No results for input(s): INR in the last 72 hours.  Scheduled Meds: . amLODipine  2.5 mg Oral Daily  . aspirin EC  81 mg Oral Daily  . atorvastatin  80 mg Oral QHS  . buPROPion  150 mg Oral QPM  . FLUoxetine  20 mg Oral Daily  . fluticasone  2 spray Each Nare Daily  . heparin subcutaneous  5,000 Units Subcutaneous 3 times per day  . insulin aspart  0-15 Units Subcutaneous TID WC  . isosorbide mononitrate  30 mg Oral Daily  . levothyroxine  112 mcg Oral QAC breakfast  . pantoprazole  40 mg Oral BID  . QUEtiapine  50 mg Oral BID  . QUEtiapine  600 mg Oral QHS  . sodium chloride flush  3 mL Intravenous Q12H  . ticagrelor  90 mg Oral BID   Continuous Infusions: . sodium chloride 100 mL/hr at 04/18/15 0500   PRN Meds:.sodium chloride,  acetaminophen, albuterol, bisacodyl, diazepam, hydrALAZINE, morphine injection, nitroGLYCERIN, ondansetron (ZOFRAN) IV, propranolol, senna-docusate, sodium chloride flush   Imaging: Dg Chest 2 View  04/19/2015  CLINICAL DATA:  Mid chest pain off and on for 2 days. Coronary stent placed 2 days ago. Shortness of breath. EXAM: CHEST  2 VIEW COMPARISON:  03/31/2014 FINDINGS: Prior CABG. Mild cardiomegaly. Linear densities in the right middle lobe, likely atelectasis. Left lung is clear. No effusions or acute bony abnormality. IMPRESSION: Right basilar atelectasis.  Mild cardiomegaly. Electronically Signed   By: Rolm Baptise M.D.   On: 04/19/2015 12:12   Ct Angio Chest Pe W/cm &/or Wo Cm  04/19/2015  CLINICAL DATA:  Pleuritic chest pain x2 days EXAM: CT ANGIOGRAPHY CHEST WITH CONTRAST  IMPRESSION: No evidence of pulmonary embolism. No evidence of acute cardiopulmonary disease. Electronically Signed   By: Julian Hy M.D.   On: 04/19/2015 15:37    Cardiac Studies: Cath 04/17/15  Prox RCA lesion, 30% stenosed.  Mid RCA lesion, 60% stenosed.  Dist RCA lesion, 90% stenosed.  SVG was injected is normal in caliber, and is anatomically normal.  RIMA was injected is normal in caliber, and is anatomically normal.  Free RIMA graft patent  to diagonal.  Mid Cx lesion, 50% stenosed.  LIMA was injected .  There is severe disease in the graft.  Atretic LIMA graft which had supplied the LAD.  Mid Graft to Dist Graft lesion, 100% stenosed.  LM lesion, 85% stenosed. Post intervention, there is a 0% residual stenosis.  The left ventricular systolic function is normal.   Assessment/Plan:  55 y.o. female with a history of CABG x 3 in [place] 2015. She has had chronic chest pain since CABG. Recently she had chest pain suspicious for angina. OP Myoview showed small ant/lat defect. Pt admitted for cath 04/17/15. She had an 85% LM stenosis that was treated with DES. She continues to complain of pain  (atypical) and SOB- Troponin normal.   Principal Problem:   CAD involving native coronary artery of native heart with [place] Active Problems:   Chest pain   CAD -S/P LM DES 04/17/15   Abnormal nuclear cardiac imaging test   CAD- CABG x 09 Feb 2013 ([place])   Essential hypertension   Obesity- BMI 31   Dyslipidemia   Hypothyroid   Bipolar disorder, unspecified ([place])   Hyperlipidemia   CAD in native artery   PLAN: Difficult situation- chest pain is atypical and chronic since her CABG. She is s/p LM stent. BNP, CTA, Troponin, O2 sats all unremarkable.   Kerin Ransom PA-C 04/21/2015, 7:44 AM [phone]   Attending Note:   The patient was seen and examined.  Agree with assessment and plan as noted above.  Changes made to the above note as needed.  Pt was sleeping soundly when I came in this am Said that she was feeling well  OK for DC Follow up with PA / NP in several weeks and will see me in 3 months  She should get her noncardiac meds filled by her primary medical doctor    Thayer Headings, Brooke Bonito., MD, [facility] 04/21/2015, 9:19 AM [address],  [place] Pager [phone]

## 2015-04-21 NOTE — Discharge Instructions (Signed)
Coronary Angiogram With Stent, Care After °Refer to this sheet in the next few weeks. These instructions provide you with information about caring for yourself after your procedure. Your health care provider may also give you more specific instructions. Your treatment has been planned according to current medical practices, but problems sometimes occur. Call your health care provider if you have any problems or questions after your procedure. °WHAT TO EXPECT AFTER THE PROCEDURE  °After your procedure, it is typical to have the following: °· Bruising at the catheter insertion site that usually fades within 1-2 weeks. °· Blood collecting in the tissue (hematoma) that may be painful to the touch. It should usually decrease in size and tenderness within 1-2 weeks. °HOME CARE INSTRUCTIONS °· Take medicines only as directed by your health care provider. Blood thinners may be prescribed after your procedure to improve blood flow through the stent. °· You may shower 24-48 hours after the procedure or as directed by your health care provider. Remove the bandage (dressing) and gently wash the catheter insertion site with plain soap and water. Pat the area dry with a clean towel. Do not rub the site, because this may cause bleeding. °· Do not take baths, swim, or use a hot tub until your health care provider approves. °· Check your catheter insertion site every day for redness, swelling, or drainage. °· Do not apply powder or lotion to the site. °· Do not lift over 10 lb (4.5 kg) for 5 days after your procedure or as directed by your health care provider. °· Ask your health care provider when it is okay to: °¨ Return to work or school. °¨ Resume usual physical activities or sports. °¨ Resume sexual activity. °· Eat a heart-healthy diet. This should include plenty of fresh fruits and vegetables. Meat should be lean cuts. Avoid the following types of food: °¨ Food that is high in salt. °¨ Canned or highly processed food. °¨ Food  that is high in saturated fat or sugar. °¨ Fried food. °· Make any other lifestyle changes as recommended by your health care provider. These may include: °¨ Not using any tobacco products, including cigarettes, chewing tobacco, or electronic cigarettes. If you need help quitting, ask your health care provider. °¨ Managing your weight. °¨ Getting regular exercise. °¨ Managing your blood pressure. °¨ Limiting your alcohol intake. °¨ Managing other health problems, such as diabetes. °· If you need an MRI after your heart stent has been placed, be sure to tell the health care provider who orders the MRI that you have a heart stent. °· Keep all follow-up visits as directed by your health care provider. This is important. °SEEK MEDICAL CARE IF: °· You have a fever. °· You have chills. °· You have increased bleeding from the catheter insertion site. Hold pressure on the site. °SEEK IMMEDIATE MEDICAL CARE IF: °· You develop chest pain or shortness of breath, feel faint, or pass out. °· You have unusual pain at the catheter insertion site. °· You have redness, warmth, or swelling at the catheter insertion site. °· You have drainage (other than a small amount of blood on the dressing) from the catheter insertion site. °· The catheter insertion site is bleeding, and the bleeding does not stop after 30 minutes of holding steady pressure on the site. °· You develop bleeding from any other place, such as from your rectum. There may be bright red blood in your urine or stool, or it may appear as black, tarry stool. °  °  This information is not intended to replace advice given to you by your health care provider. Make sure you discuss any questions you have with your health care provider. °  °Document Released: 08/13/2004 Document Revised: 02/14/2014 Document Reviewed: 06/18/2012 °Elsevier Interactive Patient Education ©2016 [employer] ° °

## 2015-04-21 NOTE — Progress Notes (Signed)
Pt refusing bed alarm and telemetry monitor. Educated pt the importance of the bed alarm and telemetry box. Will continue to monitor.    Ruben Reason, RN

## 2015-04-22 ENCOUNTER — Telehealth: Payer: Self-pay | Admitting: Physician Assistant

## 2015-04-22 MED FILL — ISOSORBIDE MN ER 30 MG TAB: 30 | 30 days supply | Qty: 30 | Fill #0

## 2015-04-22 NOTE — Telephone Encounter (Signed)
New message      TCM appt on 04-28-15 per [facility]

## 2015-04-23 NOTE — Telephone Encounter (Signed)
Patient contacted regarding discharge from [facility] on 04/21/15.  Patient understands to follow up with provider Rosaria Ferries, PA on Tues. 3/21 at 9:30 am at [facility]. Patient understands discharge instructions? Yes  Patient understands medications and regiment? Yes Patient understands to bring all medications to this visit? Yes  Patient denies questions or concerns at this time.

## 2015-04-27 MED FILL — ?LEVOTHYROXINE 112 MCG TAB: 112 | 30 days supply | Qty: 30 | Fill #1

## 2015-04-28 ENCOUNTER — Ambulatory Visit (INDEPENDENT_AMBULATORY_CARE_PROVIDER_SITE_OTHER): Payer: Self-pay | Admitting: Physician Assistant

## 2015-04-28 ENCOUNTER — Encounter: Payer: Self-pay | Admitting: Physician Assistant

## 2015-04-28 VITALS — BP 140/90 | HR 73 | Ht 62.0 in | Wt 170.0 lb

## 2015-04-28 DIAGNOSIS — I25700 Atherosclerosis of coronary artery bypass graft(s), unspecified, with unstable angina pectoris: Secondary | ICD-10-CM

## 2015-04-28 DIAGNOSIS — I10 Essential (primary) hypertension: Secondary | ICD-10-CM

## 2015-04-28 DIAGNOSIS — R072 Precordial pain: Secondary | ICD-10-CM

## 2015-04-28 MED ORDER — AMLODIPINE BESYLATE 5 MG PO TABS: 5.0000 mg | ORAL_TABLET | Freq: Every day | ORAL | Status: DC

## 2015-04-28 MED FILL — !VENTOLIN HFA INHALER: 108 (90 BAS | 25 days supply | Qty: 18 | Fill #0

## 2015-04-28 NOTE — Patient Instructions (Signed)
Increase Norvasc to 5 mg daily   Your physician wants you to follow-up in: 6 months with Dr.Nahser. You will receive a reminder letter in the mail two months in advance. If you don't receive a letter, please call our office to schedule the follow-up appointment.
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
## 2015-04-28 NOTE — Addendum Note (Signed)
Addended by: Lonn Georgia on: 04/28/2015 04:11 PM   Modules accepted: Level of Service

## 2015-04-29 ENCOUNTER — Inpatient Hospital Stay: Payer: Self-pay | Admitting: Critical Care Medicine

## 2015-05-01 ENCOUNTER — Inpatient Hospital Stay: Payer: Self-pay | Admitting: Internal Medicine

## 2015-05-05 MED FILL — FLUoxetine HCL 40 MG CAPS: 40 | 30 days supply | Qty: 30 | Fill #0

## 2015-05-06 ENCOUNTER — Inpatient Hospital Stay: Payer: Self-pay | Admitting: Critical Care Medicine

## 2015-05-06 MED FILL — AMLODIPINE BESYLATE 5 MG TA: 5 | 30 days supply | Qty: 30 | Fill #0

## 2015-05-11 ENCOUNTER — Other Ambulatory Visit: Payer: Self-pay

## 2015-05-11 DIAGNOSIS — Z1231 Encounter for screening mammogram for malignant neoplasm of breast: Secondary | ICD-10-CM

## 2015-05-18 MED FILL — IBUPROFEN 800 MG TABLET: 800 | 7 days supply | Qty: 20 | Fill #0

## 2015-06-01 MED FILL — FLUoxetine HCL 40 MG CAPS: 40 | 30 days supply | Qty: 30 | Fill #1

## 2015-06-01 MED FILL — LEVOTHYROXINE 112 MCG TAB: 112 | 30 days supply | Qty: 30 | Fill #2

## 2015-06-01 MED FILL — ?BUPROPION HCL SR 150 MG TA: 150 | 30 days supply | Qty: 60 | Fill #2

## 2015-06-01 MED FILL — BRILINTA 90 MG TABLET: 90 | 30 days supply | Qty: 60 | Fill #1

## 2015-06-01 MED FILL — AMLODIPINE BESYLATE 5 MG TA: 5 | 30 days supply | Qty: 30 | Fill #1

## 2015-06-01 MED FILL — ATORVASTATIN 80 MG TABLET: 80 | 30 days supply | Qty: 30 | Fill #1

## 2015-06-01 MED FILL — PROPRANOLOL 20 MG TABLET: 20 | 30 days supply | Qty: 60 | Fill #2

## 2015-06-02 ENCOUNTER — Ambulatory Visit: Payer: Self-pay

## 2015-06-05 ENCOUNTER — Ambulatory Visit
Admission: RE | Admit: 2015-06-05 | Discharge: 2015-06-05 | Disposition: A | Discharge status: Home / Self Care | Payer: No Typology Code available for payment source | Source: Ambulatory Visit

## 2015-06-05 DIAGNOSIS — Z1231 Encounter for screening mammogram for malignant neoplasm of breast: Secondary | ICD-10-CM

## 2015-06-22 ENCOUNTER — Other Ambulatory Visit: Payer: Self-pay | Admitting: Internal Medicine

## 2015-06-22 MED ORDER — TICAGRELOR 90 MG PO TABS: 90.0000 mg | ORAL_TABLET | Freq: Two times a day (BID) | ORAL | Status: DC

## 2015-06-30 ENCOUNTER — Other Ambulatory Visit: Payer: Self-pay | Admitting: Internal Medicine

## 2015-06-30 MED FILL — BRILINTA 90 MG TABLET: 90 | 30 days supply | Qty: 60 | Fill #2

## 2015-06-30 MED FILL — LEVOTHYROXINE 112 MCG TAB: 112 | 30 days supply | Qty: 30 | Fill #3

## 2015-06-30 MED FILL — AMLODIPINE BESYLATE 5 MG TA: 5 | 30 days supply | Qty: 30 | Fill #2

## 2015-06-30 MED FILL — OLANZapine 20 MG TABS: 20 | 30 days supply | Qty: 30 | Fill #0

## 2015-06-30 MED FILL — BUPROPION SR 150 MG TABLET: 150 | 30 days supply | Qty: 60 | Fill #0

## 2015-06-30 MED FILL — HYDROXYZINE PAM 25 MG CAP: 25 | 30 days supply | Qty: 60 | Fill #0

## 2015-06-30 MED FILL — FLUoxetine HCL 20 MG CAPS: 20 | 30 days supply | Qty: 90 | Fill #0

## 2015-06-30 MED FILL — ?PANTOPRAZOLE SOD DR 40MG: 40 MG | 30 days supply | Qty: 60 | Fill #0

## 2015-06-30 MED FILL — ATORVASTATIN 80 MG TABLET: 80 | 30 days supply | Qty: 30 | Fill #2

## 2015-06-30 MED FILL — PROPRANOLOL 20 MG TABLET: 20 | 30 days supply | Qty: 60 | Fill #0

## 2015-07-06 ENCOUNTER — Ambulatory Visit: Payer: No Typology Code available for payment source | Admitting: Family Medicine

## 2015-07-09 ENCOUNTER — Ambulatory Visit: Payer: No Typology Code available for payment source | Attending: Family Medicine | Admitting: Physician Assistant

## 2015-07-09 ENCOUNTER — Encounter: Payer: Self-pay | Admitting: Physician Assistant

## 2015-07-09 VITALS — BP 138/90 | HR 66 | Temp 98.1°F | Wt 165.4 lb

## 2015-07-09 DIAGNOSIS — M1990 Unspecified osteoarthritis, unspecified site: Secondary | ICD-10-CM | POA: Insufficient documentation

## 2015-07-09 DIAGNOSIS — I10 Essential (primary) hypertension: Secondary | ICD-10-CM

## 2015-07-09 DIAGNOSIS — F329 Major depressive disorder, single episode, unspecified: Secondary | ICD-10-CM | POA: Insufficient documentation

## 2015-07-09 DIAGNOSIS — E038 Other specified hypothyroidism: Secondary | ICD-10-CM

## 2015-07-09 DIAGNOSIS — Z8673 Personal history of transient ischemic attack (TIA), and cerebral infarction without residual deficits: Secondary | ICD-10-CM | POA: Insufficient documentation

## 2015-07-09 DIAGNOSIS — R0981 Nasal congestion: Secondary | ICD-10-CM

## 2015-07-09 DIAGNOSIS — E089 Diabetes mellitus due to underlying condition without complications: Secondary | ICD-10-CM

## 2015-07-09 DIAGNOSIS — Z7982 Long term (current) use of aspirin: Secondary | ICD-10-CM | POA: Insufficient documentation

## 2015-07-09 DIAGNOSIS — E119 Type 2 diabetes mellitus without complications: Secondary | ICD-10-CM | POA: Insufficient documentation

## 2015-07-09 DIAGNOSIS — Z79899 Other long term (current) drug therapy: Secondary | ICD-10-CM | POA: Insufficient documentation

## 2015-07-09 DIAGNOSIS — E039 Hypothyroidism, unspecified: Secondary | ICD-10-CM | POA: Insufficient documentation

## 2015-07-09 LAB — THYROID PANEL WITH TSH
Free Thyroxine Index: 3.7 (ref 1.4–3.8)
T3 UPTAKE: 28 % (ref 22–35)
T4 TOTAL: 13.1 ug/dL — AB (ref 4.5–12.0)
TSH: 1.21 m[IU]/L

## 2015-07-09 LAB — POCT CBG (FASTING - GLUCOSE)-MANUAL ENTRY: GLUCOSE FASTING, POC: 94 mg/dL (ref 70–99)

## 2015-07-09 LAB — POCT GLYCOSYLATED HEMOGLOBIN (HGB A1C): Hemoglobin A1C: 6.1

## 2015-07-09 MED ORDER — CLOTRIMAZOLE 1 % EX CREA: 1.0000 "application " | TOPICAL_CREAM | Freq: Two times a day (BID) | CUTANEOUS | Status: DC | PRN

## 2015-07-09 MED ORDER — FLUTICASONE PROPIONATE 50 MCG/ACT NA SUSP: 2.0000 | Freq: Every day | NASAL | Status: DC

## 2015-07-09 MED FILL — CLOTRIMAZOLE 1% CREAM: 1 | 30 days supply | Qty: 57 | Fill #0

## 2015-07-09 MED FILL — FLUTICASONE PROP 50 MCG SPR: 50 | 30 days supply | Qty: 16 | Fill #0

## 2015-07-09 NOTE — Progress Notes (Signed)
Patient ID: Lindsay Rivera, female   DOB: [birth year], 55 y.o.   MRN: [id number]   Lindsay Rivera, is a 55 y.o. female  [id number]  [id number]  DOB - September 21, 1960  Chief Complaint  Patient presents with  . Establish Care    Reestablish Care; Thyroid         Subjective:  Chief Complaint and HPI: Lindsay Rivera is a 55 y.o. female here today for thyroid and diabetes check.  She checks her sugar at home. It is almost always <100.  She is compliant with her medications.  She is taking 141mcg of her synthoid. She c/o fatigue.  She cares for her grandchildren.  She is concerned about weight gain(but she is 13 pounds lighter than she was 12 weeks ago!).  She has been having trouble with depression and was just seen at [facility] this week and her Prozac dose was increased.  Denies SI/HI.  She saw the cardiologist about 7 weeks ago and had a good check up.  Requests RF on Lotrimin and Flonase.    ROS:   Constitutional:  No f/c, No night sweats, No unexplained weight loss. +fatigue EENT:  No vision changes, No blurry vision, No hearing changes. No mouth, throat, or ear problems.  Respiratory: No cough, No SOB Cardiac: No CP(except where wires from CABG are-she may have these removed.  She had to have dental work done first which she has now completed), no palpitations GI:  No abd pain, No N/V/D. GU: No Urinary s/sx Musculoskeletal: No joint pain Neuro: No headache, no dizziness, no motor weakness.  Skin: frequent ringworm Endocrine:  No polydipsia. No polyuria.  Psych: Denies SI/HI  No problems updated.  ALLERGIES: Allergies  Allergen Reactions  . Penicillins Swelling    Has patient had a PCN reaction causing immediate rash, facial/tongue/throat swelling, SOB or lightheadedness with hypotension: No Has patient had a PCN reaction causing severe rash involving mucus membranes or skin necrosis: No Has patient had a PCN reaction that required hospitalization No Has patient had a PCN  reaction occurring within the last 10 years: No If all of the above answers are "NO", then may proceed with Cephalosporin use.   . Latex Rash    PAST MEDICAL HISTORY: Past Medical History  Diagnosis Date  . Depression   . Bipolar disorder ([place])   . Hypertension   . Asthma   . Anxiety   . GERD (gastroesophageal reflux disease)   . Hyperlipidemia   . Constipation   . Anemia   . CHF (congestive heart failure) ([place])   . MI (myocardial infarction) ([employer]) 02/08/2013  . Hypothyroidism   . Type II diabetes mellitus ([place])     "diet controlled" (04/17/2015)  . Migraine     "q 3-4 months" (04/17/2015)  . CVA (cerebral vascular accident) ([place]) 2010    denies residual on 04/17/2015  . Arthritis     "lower back" (04/17/2015)  . Chronic lower back pain     MEDICATIONS AT HOME: Prior to Admission medications   Medication Sig Start Date End Date Taking? Authorizing Provider  acetaminophen (TYLENOL) 325 MG tablet Take 2 tablets (650 mg total) by mouth every 4 (four) hours as needed for headache or mild pain. 04/21/15  Yes Luke K Kilroy, PA-C  albuterol (PROVENTIL HFA;VENTOLIN HFA) 108 (90 BASE) MCG/ACT inhaler Inhale 2 puffs into the lungs every 6 (six) hours as needed for wheezing. 12/22/14  Yes Josalyn Funches, MD  amLODipine (NORVASC) 5 MG tablet Take 1 tablet (5  mg total) by mouth daily. 04/28/15  Yes Rhonda G Barrett, PA-C  aspirin EC 81 MG tablet Take 1 tablet (81 mg total) by mouth daily. 04/15/15  Yes Thayer Headings, MD  atorvastatin (LIPITOR) 80 MG tablet Take 1 tablet (80 mg total) by mouth at bedtime. 04/16/15  Yes Thayer Headings, MD  buPROPion (WELLBUTRIN XL) 150 MG 24 hr tablet Take 1 tablet (150 mg total) by mouth every evening. 02/13/14  Yes Lorayne Marek, MD  Calcium Carbonate-Vitamin D 600-400 MG-UNIT per chew tablet Chew 1 tablet by mouth daily. Reported on 03/25/2015   Yes Historical Provider, MD  FLUoxetine (PROZAC) 20 MG tablet Take 20 mg by mouth daily. 03/20/15  Yes Historical  Provider, MD  hydrOXYzine (ATARAX/VISTARIL) 25 MG tablet Take 25 mg by mouth at bedtime.   Yes Historical Provider, MD  isosorbide mononitrate (IMDUR) 30 MG 24 hr tablet Take 1 tablet (30 mg total) by mouth daily. 04/21/15  Yes Luke K Kilroy, PA-C  levothyroxine (SYNTHROID, LEVOTHROID) 112 MCG tablet Take 1 tablet (112 mcg total) by mouth daily. 01/28/15  Yes Lance Bosch, NP  lidocaine (LIDODERM) 5 % Place 1 patch onto the skin daily as needed (pain). Remove & Discard patch within 12 hours or as directed by MD   Yes Historical Provider, MD  NITROSTAT 0.4 MG SL tablet PLACE 1 TABLET UNDER THE TONGUE EVERY 5 MINS AS NEEDED FOR CHEST PAIN 04/16/15  Yes Tresa Garter, MD  pantoprazole (PROTONIX) 40 MG tablet Take 1 tablet by mouth twice daily 06/30/15  Yes Jerene Bears, MD  Polyethylene Glycol 3350 (MIRALAX PO) Take 17 g by mouth daily as needed. Constipation   Yes Historical Provider, MD  propranolol (INDERAL) 20 MG tablet Take 1 tablet (20 mg total) by mouth 2 (two) times daily as needed (anxiety). 02/13/14  Yes Lorayne Marek, MD  QUEtiapine (SEROQUEL) 400 MG tablet Take 600 mg by mouth at bedtime.   Yes Historical Provider, MD  QUEtiapine (SEROQUEL) 50 MG tablet Take 50 mg by mouth 2 (two) times daily.   Yes Historical Provider, MD  ticagrelor (BRILINTA) 90 MG TABS tablet Take 1 tablet (90 mg total) by mouth 2 (two) times daily. 06/22/15  Yes Tresa Garter, MD  aspirin 325 MG tablet Take 325 mg by mouth daily. Reported on 07/09/2015    Historical Provider, MD  clotrimazole (LOTRIMIN) 1 % cream Apply 1 application topically 2 (two) times daily as needed. Reported on 07/09/2015 07/09/15   Argentina Donovan, PA-C  diazepam (VALIUM) 5 MG tablet Take 5 mg by mouth 2 (two) times daily as needed for anxiety. Reported on 07/09/2015    Historical Provider, MD  fluticasone (FLONASE) 50 MCG/ACT nasal spray Place 2 sprays into both nostrils daily. 07/09/15   Argentina Donovan, PA-C     Objective:  EXAM:   Filed  Vitals:   07/09/15 1627  BP: 138/90  Pulse: 66  Temp: 98.1 F (36.7 C)  TempSrc: Oral  Weight: 165 lb 6.4 oz (75.025 kg)    General appearance : A&OX3. NAD. Non-toxic-appearing. Pleasant.  HEENT: Atraumatic and Normocephalic.  PERRLA. EOM intact.  TM clear B. Mouth-MMM, post pharynx WNL w/o erythema, No PND. Neck: supple, no JVD. No cervical lymphadenopathy. No thyromegaly Chest/Lungs:  Breathing-non-labored, Good air entry bilaterally, breath sounds normal without rales, rhonchi, or wheezing  CVS: RRR w/o m/g/r  Extremities: Bilateral Lower Ext shows no edema, both legs are warm to touch with = pulse throughout Neurology:  CN II-XII grossly intact, Non focal.   Psych:  TP linear. J/I WNL. Normal speech. Appropriate eye contact and affect.  Skin:  No Rash  Data Review Lab Results  Component Value Date   HGBA1C 6.1 07/09/2015   HGBA1C 6.30 01/20/2015   PHQ=24, no SI.   Assessment & Plan   1. Other specified hypothyroidism - Thyroid Panel With TSH  2. Diabetes mellitus due to underlying condition without complication, without long-term current use of insulin (HCC) Controlled-Continue current regimen - POCT A1C - POCT CBG (Fasting - Glucose)  3. Essential hypertension Continue current regimen.  Check BP OOO. We have discussed target BP range and blood pressure goal. I have advised patient to check BP regularly and to call us back or report to clinic if the numbers are consistently higher than 140/90. We discussed the importance of compliance with medical therapy and DASH diet recommended, consequences of uncontrolled hypertension discussed.   4. Nasal congestion - fluticasone (FLONASE) 50 MCG/ACT nasal spray; Place 2 sprays into both nostrils daily.  Dispense: 16 g; Refill: 3  5. Depression-PHQ=24. No SI.  Followed at [facility] with recent visit and medication adjustment.  Patient feels good about plan and her f/up there.     Patient have been counseled extensively about  nutrition and exercise.  Also counseled about self-care in general.   Return in about 3 months (around 10/09/2015) for f/up dr Adrian Blackwater.  The patient was given clear instructions to go to ER or return to medical center if symptoms don't improve, worsen or new problems develop. The patient verbalized understanding. The patient was told to call to get lab results if they haven't heard anything in the next week.    Freeman Caldron, PA-C [facility] and [facility] [place], [place]   07/09/2015, 4:58 PM

## 2015-07-13 ENCOUNTER — Ambulatory Visit: Payer: No Typology Code available for payment source | Attending: Internal Medicine

## 2015-07-14 ENCOUNTER — Telehealth: Payer: Self-pay | Admitting: Cardiovascular Disease

## 2015-07-14 MED ORDER — CLOPIDOGREL BISULFATE 75 MG PO TABS: 75.0000 mg | ORAL_TABLET | Freq: Every day | ORAL | Status: DC

## 2015-07-14 MED FILL — CLOPIDOGREL 75 MG TABLET: 75 | 90 days supply | Qty: 94 | Fill #0

## 2015-07-14 NOTE — Telephone Encounter (Signed)
New message  Pt c/o Shortness Of Breath: STAT if SOB developed within the last 24 hours or pt is noticeably SOB on the phone  1. Are you currently SOB (can you hear that pt is SOB on the phone)? Yes ( Not noticeable)  2. How long have you been experiencing SOB? Every since she left the hospital on march 14. Since the stent was place. She visited her PCP who didn't hear any fluid on her lungs.  3. Are you SOB when sitting or when up moving around? Both  4.  Are you currently experiencing any other symptoms? Just SOB like she cannot breathe.

## 2015-07-14 NOTE — Telephone Encounter (Signed)
Pt calling c/o continued SOB since her stent placement 3/10.  She saw her PCP on Friday who says her lungs are clear.  She has a history of asthma and uses an inhaler however this does not help.  Her wt is down 13 lbs in the past 2 months and she denies any edema at feet/ankles but feels like her abdomin is somewhat swollen.  She denies cp.  She is on Brilliant.  Advised I will forward this information to Dr Acie Fredrickson for review and call her back with any further instructions.

## 2015-07-14 NOTE — Telephone Encounter (Signed)
Agree with Cherre Huger , RN Patient should improve with changing to Plavix .

## 2015-07-14 NOTE — Telephone Encounter (Signed)
Reviewed information with Dr Acie Fredrickson.  He gave verbal orders to d/c Brilinata and start Plavix 300 mg X 1 dose then 75 mg daily thereafter. Spoke with patient and reviewed instructions.  She states understanding to start Plavix 300 mg X 1 dose then take 75 mg a day thereafter.  She is aware to stop Brilinata once she starts Plavix.  RX sent into her pharmacy electronically.  She will call back if no improvement in SOB after the medication change.

## 2015-07-17 ENCOUNTER — Other Ambulatory Visit: Payer: Self-pay

## 2015-07-22 ENCOUNTER — Other Ambulatory Visit (INDEPENDENT_AMBULATORY_CARE_PROVIDER_SITE_OTHER): Payer: No Typology Code available for payment source

## 2015-07-22 DIAGNOSIS — Z79899 Other long term (current) drug therapy: Secondary | ICD-10-CM

## 2015-07-22 LAB — HEPATIC FUNCTION PANEL
ALK PHOS: 139 U/L — AB (ref 33–130)
ALT: 16 U/L (ref 6–29)
AST: 17 U/L (ref 10–35)
Albumin: 4.1 g/dL (ref 3.6–5.1)
BILIRUBIN DIRECT: 0.1 mg/dL (ref <=0.2)
BILIRUBIN INDIRECT: 0.3 mg/dL (ref 0.2–1.2)
Total Bilirubin: 0.4 mg/dL (ref 0.2–1.2)
Total Protein: 7.2 g/dL (ref 6.1–8.1)

## 2015-07-22 LAB — LIPID PANEL
CHOL/HDL RATIO: 3.3 ratio (ref <=5.0)
CHOLESTEROL: 154 mg/dL (ref 125–200)
HDL: 47 mg/dL (ref >=46)
LDL Cholesterol: 81 mg/dL (ref <130)
Triglycerides: 131 mg/dL (ref <150)
VLDL: 26 mg/dL (ref <30)

## 2015-08-03 ENCOUNTER — Encounter: Payer: Self-pay | Admitting: Cardiovascular Disease

## 2015-08-03 ENCOUNTER — Ambulatory Visit (INDEPENDENT_AMBULATORY_CARE_PROVIDER_SITE_OTHER): Payer: No Typology Code available for payment source | Admitting: Cardiovascular Disease

## 2015-08-03 VITALS — BP 130/100 | HR 56 | Ht 62.0 in | Wt 165.8 lb

## 2015-08-03 DIAGNOSIS — R0789 Other chest pain: Secondary | ICD-10-CM | POA: Insufficient documentation

## 2015-08-03 DIAGNOSIS — I25118 Atherosclerotic heart disease of native coronary artery with other forms of angina pectoris: Secondary | ICD-10-CM

## 2015-08-03 DIAGNOSIS — I10 Essential (primary) hypertension: Secondary | ICD-10-CM

## 2015-08-03 DIAGNOSIS — I2510 Atherosclerotic heart disease of native coronary artery without angina pectoris: Secondary | ICD-10-CM

## 2015-08-03 DIAGNOSIS — R079 Chest pain, unspecified: Secondary | ICD-10-CM

## 2015-08-03 MED ORDER — HYDROCHLOROTHIAZIDE 25 MG PO TABS: 25.0000 mg | ORAL_TABLET | Freq: Every day | ORAL | Status: DC

## 2015-08-03 MED ORDER — POTASSIUM CHLORIDE ER 10 MEQ PO TBCR: 10.0000 meq | EXTENDED_RELEASE_TABLET | Freq: Every day | ORAL | Status: DC

## 2015-08-03 MED FILL — POTASSIUM CL 10 MEQ TAB SA: 10 | 30 days supply | Qty: 30 | Fill #0

## 2015-08-03 MED FILL — HYDROCHLOROTHIAZIDE 25 MG T: 25 | 30 days supply | Qty: 30 | Fill #0
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
## 2015-08-03 NOTE — Patient Instructions (Signed)
Medication Instructions:  START HCTZ (hydrochlorothiazide) 25 mg daily - take in the morning START Kdur (potassium supplement) 10 meq once daily   Labwork: Your physician recommends that you return for lab work in: 3 weeks for basic metabolic panel   Testing/Procedures: None Ordered   Follow-Up: Your physician wants you to follow-up in: 6 months with Dr. Acie Fredrickson.  You will receive a reminder letter in the mail two months in advance. If you don't receive a letter, please call our office to schedule the follow-up appointment.   If you need a refill on your cardiac medications before your next appointment, please call your pharmacy.   Thank you for choosing [facility]! Christen Bame, RN [phone]

## 2015-08-12 ENCOUNTER — Other Ambulatory Visit: Payer: Self-pay | Admitting: Internal Medicine

## 2015-08-12 MED FILL — FLUoxetine HCL 20 MG CAPS: 20 | 30 days supply | Qty: 90 | Fill #1

## 2015-08-12 MED FILL — LEVOTHYROXINE 112 MCG TAB: 112 | 30 days supply | Qty: 30 | Fill #0

## 2015-08-12 MED FILL — ?PANTOPRAZOLE SOD DR 40MG: 40 MG | 30 days supply | Qty: 60 | Fill #1

## 2015-08-12 MED FILL — PROPRANOLOL 20 MG TABLET: 20 | 30 days supply | Qty: 60 | Fill #1

## 2015-08-12 MED FILL — OLANZapine 20 MG TABS: 20 | 30 days supply | Qty: 30 | Fill #1

## 2015-08-12 MED FILL — AMLODIPINE BESYLATE 5 MG TA: 5 | 30 days supply | Qty: 30 | Fill #3

## 2015-08-12 MED FILL — BUPROPION SR 150 MG TABLET: 150 | 30 days supply | Qty: 60 | Fill #1

## 2015-08-12 MED FILL — ATORVASTATIN 80 MG TABLET: 80 | 30 days supply | Qty: 30 | Fill #3

## 2015-08-12 MED FILL — HYDROXYZINE PAM 25 MG CAP: 25 | 30 days supply | Qty: 60 | Fill #1

## 2015-08-24 ENCOUNTER — Other Ambulatory Visit: Payer: No Typology Code available for payment source

## 2015-09-10 MED FILL — PROPRANOLOL 20 MG TABLET: 20 | 30 days supply | Qty: 60 | Fill #2

## 2015-09-10 MED FILL — HYDROCHLOROTHIAZIDE 25 MG T: 25 | 30 days supply | Qty: 30 | Fill #1

## 2015-09-10 MED FILL — HYDROXYZINE PAM 25 MG CAP: 25 | 30 days supply | Qty: 60 | Fill #2

## 2015-09-10 MED FILL — AMLODIPINE BESYLATE 5 MG TA: 5 | 30 days supply | Qty: 30 | Fill #4

## 2015-09-10 MED FILL — FLUoxetine HCL 20 MG CAPS: 20 | 30 days supply | Qty: 90 | Fill #2

## 2015-09-10 MED FILL — ATORVASTATIN 80 MG TABLET: 80 | 30 days supply | Qty: 30 | Fill #4

## 2015-09-10 MED FILL — ?LEVOTHYROXINE 112 MCG TAB: 112 | 30 days supply | Qty: 30 | Fill #1

## 2015-09-10 MED FILL — ?PANTOPRAZOLE SOD DR 40MG: 40 MG | 30 days supply | Qty: 60 | Fill #2

## 2015-09-10 MED FILL — POTASSIUM CL 10 MEQ TAB SA: 10 | 30 days supply | Qty: 30 | Fill #1

## 2015-09-10 MED FILL — OLANZapine 20 MG TABS: 20 | 30 days supply | Qty: 30 | Fill #2

## 2015-09-10 MED FILL — ?BUPROPION HCL SR 150 MG TA: 150 | 30 days supply | Qty: 60 | Fill #2

## 2015-09-23 MED FILL — CLOPIDOGREL 75 MG TABLET: 75 | 90 days supply | Qty: 94 | Fill #1

## 2015-10-05 MED FILL — POTASSIUM CL 10 MEQ TAB SA: 10 | 30 days supply | Qty: 30 | Fill #2

## 2015-10-05 MED FILL — AMLODIPINE BESYLATE 5 MG TA: 5 | 30 days supply | Qty: 30 | Fill #5

## 2015-10-05 MED FILL — VENTOLIN HFA 90 MCG INHALER: 108 (90 BAS | 25 days supply | Qty: 18 | Fill #1

## 2015-10-05 MED FILL — FLUTICASONE PROP 50 MCG SPR: 50 | 30 days supply | Qty: 16 | Fill #1

## 2015-10-05 MED FILL — ATORVASTATIN 80 MG TABLET: 80 | 30 days supply | Qty: 30 | Fill #5

## 2015-10-05 MED FILL — ?LEVOTHYROXINE 112 MCG TAB: 112 | 30 days supply | Qty: 30 | Fill #2

## 2015-10-05 MED FILL — HYDROCHLOROTHIAZIDE 25 MG T: 25 | 30 days supply | Qty: 30 | Fill #2

## 2015-10-23 ENCOUNTER — Ambulatory Visit: Payer: Self-pay | Attending: Family Medicine | Admitting: Family Medicine

## 2015-10-23 ENCOUNTER — Ambulatory Visit: Payer: No Typology Code available for payment source | Admitting: Family Medicine

## 2015-10-23 ENCOUNTER — Encounter: Payer: Self-pay | Admitting: Family Medicine

## 2015-10-23 VITALS — BP 102/69 | HR 59 | Temp 98.0°F | Ht 62.0 in | Wt 164.8 lb

## 2015-10-23 DIAGNOSIS — E089 Diabetes mellitus due to underlying condition without complications: Secondary | ICD-10-CM

## 2015-10-23 DIAGNOSIS — N951 Menopausal and female climacteric states: Secondary | ICD-10-CM

## 2015-10-23 DIAGNOSIS — Z7901 Long term (current) use of anticoagulants: Secondary | ICD-10-CM | POA: Insufficient documentation

## 2015-10-23 DIAGNOSIS — Z8673 Personal history of transient ischemic attack (TIA), and cerebral infarction without residual deficits: Secondary | ICD-10-CM | POA: Insufficient documentation

## 2015-10-23 DIAGNOSIS — R0600 Dyspnea, unspecified: Secondary | ICD-10-CM

## 2015-10-23 DIAGNOSIS — Z955 Presence of coronary angioplasty implant and graft: Secondary | ICD-10-CM | POA: Insufficient documentation

## 2015-10-23 DIAGNOSIS — R0602 Shortness of breath: Secondary | ICD-10-CM | POA: Insufficient documentation

## 2015-10-23 DIAGNOSIS — Z7982 Long term (current) use of aspirin: Secondary | ICD-10-CM | POA: Insufficient documentation

## 2015-10-23 DIAGNOSIS — Z90710 Acquired absence of both cervix and uterus: Secondary | ICD-10-CM | POA: Insufficient documentation

## 2015-10-23 DIAGNOSIS — I2510 Atherosclerotic heart disease of native coronary artery without angina pectoris: Secondary | ICD-10-CM | POA: Insufficient documentation

## 2015-10-23 DIAGNOSIS — Z87891 Personal history of nicotine dependence: Secondary | ICD-10-CM | POA: Insufficient documentation

## 2015-10-23 DIAGNOSIS — Z1159 Encounter for screening for other viral diseases: Secondary | ICD-10-CM

## 2015-10-23 DIAGNOSIS — E041 Nontoxic single thyroid nodule: Secondary | ICD-10-CM | POA: Insufficient documentation

## 2015-10-23 DIAGNOSIS — R232 Flushing: Secondary | ICD-10-CM

## 2015-10-23 DIAGNOSIS — E038 Other specified hypothyroidism: Secondary | ICD-10-CM

## 2015-10-23 DIAGNOSIS — F319 Bipolar disorder, unspecified: Secondary | ICD-10-CM | POA: Insufficient documentation

## 2015-10-23 DIAGNOSIS — Z951 Presence of aortocoronary bypass graft: Secondary | ICD-10-CM | POA: Insufficient documentation

## 2015-10-23 LAB — CBC
HEMATOCRIT: 39.7 % (ref 35.0–45.0)
Hemoglobin: 13.1 g/dL (ref 11.7–15.5)
MCH: 27.2 pg (ref 27.0–33.0)
MCHC: 33 g/dL (ref 32.0–36.0)
MCV: 82.4 fL (ref 80.0–100.0)
MPV: 9.4 fL (ref 7.5–12.5)
Platelets: 432 10*3/uL — ABNORMAL HIGH (ref 140–400)
RBC: 4.82 MIL/uL (ref 3.80–5.10)
RDW: 18.6 % — AB (ref 11.0–15.0)
WBC: 10.5 10*3/uL (ref 3.8–10.8)

## 2015-10-23 LAB — TSH: TSH: 5.39 m[IU]/L — AB

## 2015-10-23 LAB — GLUCOSE, POCT (MANUAL RESULT ENTRY): POC GLUCOSE: 85 mg/dL (ref 70–99)

## 2015-10-23 LAB — POCT GLYCOSYLATED HEMOGLOBIN (HGB A1C): HEMOGLOBIN A1C: 6.3

## 2015-10-23 MED ORDER — METFORMIN HCL ER 500 MG PO TB24: 500.0000 mg | ORAL_TABLET | Freq: Every day | ORAL | 5 refills | Status: DC

## 2015-10-23 NOTE — Assessment & Plan Note (Signed)
A: dyspnea in former long term smoker, known CAD P: CBC CXR BNP Continue albuterol prn for now Plan for pulm referral for PFTs if above is non revealing

## 2015-10-23 NOTE — Progress Notes (Signed)
Pt is having real bad hot flashes and thyroid levels need to be checked
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
## 2015-10-23 NOTE — Assessment & Plan Note (Addendum)
Hypothyroidism Compliant with synthroid Check TSH and adjust dose as needed  Lab Results  Component Value Date   TSH 5.39 (H) 10/23/2015   Will increase synthroid from 112 to 125 mcg daily

## 2015-10-23 NOTE — Patient Instructions (Addendum)
Lindsay Rivera was seen today for thyroid problem.  Diagnoses and all orders for this visit:  Diabetes mellitus due to underlying condition without complication, without long-term current use of insulin (HCC) -     POCT glucose (manual entry) -     POCT glycosylated hemoglobin (Hb A1C) -     metFORMIN (GLUCOPHAGE XR) 500 MG 24 hr tablet; Take 1 tablet (500 mg total) by mouth daily with breakfast.  Other specified hypothyroidism -     TSH  Hot flashes -     FSH/LH  Need for hepatitis C screening test -     Hepatitis C antibody, reflex  Dyspnea -     CBC -     DG Chest 2 View; Future -     Brain natriuretic peptide   F/u in 6 weeks   Dr. Adrian Blackwater

## 2015-10-23 NOTE — Assessment & Plan Note (Signed)
Hot flashes Suspect menopausal Checking TSH Check LH/FSH

## 2015-10-24 LAB — HEPATITIS C ANTIBODY: HCV AB: NEGATIVE

## 2015-10-24 LAB — FSH/LH
FSH: 77.7 m[IU]/mL
LH: 42.5 m[IU]/mL

## 2015-10-24 LAB — BRAIN NATRIURETIC PEPTIDE: Brain Natriuretic Peptide: 84.4 pg/mL (ref <100)

## 2015-10-25 MED ORDER — LEVOTHYROXINE SODIUM 125 MCG PO TABS: 125.0000 ug | ORAL_TABLET | Freq: Every day | ORAL | 3 refills | Status: DC

## 2015-10-25 NOTE — Addendum Note (Signed)
Addended by: Boykin Nearing on: 10/25/2015 06:50 PM   Modules accepted: Orders

## 2015-10-26 ENCOUNTER — Telehealth: Payer: Self-pay

## 2015-10-26 MED FILL — ?LEVOTHYROXINE 125 MCG TABL: 125 | 30 days supply | Qty: 30 | Fill #0

## 2015-10-26 MED FILL — METFORMIN HCL ER 500 MG TAB: 500 | 30 days supply | Qty: 30 | Fill #0

## 2015-10-26 NOTE — Telephone Encounter (Signed)
Pt was called and informed of results on 9/18.

## 2015-11-13 ENCOUNTER — Telehealth: Payer: Self-pay

## 2015-11-13 ENCOUNTER — Ambulatory Visit (HOSPITAL_COMMUNITY)
Admission: RE | Admit: 2015-11-13 | Discharge: 2015-11-13 | Disposition: A | Discharge status: Home / Self Care | Payer: No Typology Code available for payment source | Source: Ambulatory Visit | Attending: Family Medicine | Admitting: Family Medicine

## 2015-11-13 DIAGNOSIS — R0600 Dyspnea, unspecified: Secondary | ICD-10-CM

## 2015-11-13 MED FILL — POTASSIUM CL 10 MEQ TAB SA: 10 | 30 days supply | Qty: 30 | Fill #3

## 2015-11-13 MED FILL — AMLODIPINE BESYLATE 5 MG TA: 5 | 30 days supply | Qty: 30 | Fill #6

## 2015-11-13 NOTE — Telephone Encounter (Signed)
-----   Message from Boykin Nearing, MD sent at 11/13/2015 11:56 AM EDT ----- Chest x-ray normal

## 2015-11-13 NOTE — Telephone Encounter (Signed)
Patient hipaa verif per guidelines; Rn advised patient per Dr. Adrian Blackwater: Chest x-ray normal.  No further questions at this time.  Priscille Heidelberg, RN, BSN

## 2015-11-19 ENCOUNTER — Ambulatory Visit: Payer: No Typology Code available for payment source | Admitting: Family Medicine

## 2015-11-30 ENCOUNTER — Other Ambulatory Visit: Payer: Self-pay | Admitting: Internal Medicine

## 2015-11-30 MED FILL — ATORVASTATIN 80 MG TABLET: 80 | 30 days supply | Qty: 30 | Fill #6

## 2015-11-30 MED FILL — ?LEVOTHYROXINE 125 MCG TABL: 125 | 30 days supply | Qty: 30 | Fill #1

## 2015-11-30 MED FILL — HYDROCHLOROTHIAZIDE 25 MG T: 25 | 30 days supply | Qty: 30 | Fill #3

## 2015-11-30 MED FILL — PANTOPRAZOLE SOD DR 40 MG T: 40 | 30 days supply | Qty: 60 | Fill #0

## 2015-11-30 MED FILL — OLANZapine 20 MG TABS: 20 | 30 days supply | Qty: 60 | Fill #0

## 2015-11-30 MED FILL — METFORMIN HCL ER 500 MG TAB: 500 | 30 days supply | Qty: 30 | Fill #1

## 2015-12-01 ENCOUNTER — Ambulatory Visit: Payer: No Typology Code available for payment source | Attending: Family Medicine | Admitting: Family Medicine

## 2015-12-01 ENCOUNTER — Encounter: Payer: Self-pay | Admitting: Family Medicine

## 2015-12-01 VITALS — BP 105/73 | HR 77 | Temp 98.1°F | Ht 62.0 in | Wt 166.0 lb

## 2015-12-01 DIAGNOSIS — L299 Pruritus, unspecified: Secondary | ICD-10-CM

## 2015-12-01 DIAGNOSIS — N951 Menopausal and female climacteric states: Secondary | ICD-10-CM | POA: Insufficient documentation

## 2015-12-01 DIAGNOSIS — Z79899 Other long term (current) drug therapy: Secondary | ICD-10-CM | POA: Insufficient documentation

## 2015-12-01 DIAGNOSIS — E785 Hyperlipidemia, unspecified: Secondary | ICD-10-CM | POA: Insufficient documentation

## 2015-12-01 DIAGNOSIS — I10 Essential (primary) hypertension: Secondary | ICD-10-CM | POA: Insufficient documentation

## 2015-12-01 DIAGNOSIS — I2510 Atherosclerotic heart disease of native coronary artery without angina pectoris: Secondary | ICD-10-CM | POA: Insufficient documentation

## 2015-12-01 DIAGNOSIS — E119 Type 2 diabetes mellitus without complications: Secondary | ICD-10-CM | POA: Insufficient documentation

## 2015-12-01 DIAGNOSIS — Z7984 Long term (current) use of oral hypoglycemic drugs: Secondary | ICD-10-CM | POA: Insufficient documentation

## 2015-12-01 DIAGNOSIS — E039 Hypothyroidism, unspecified: Secondary | ICD-10-CM | POA: Insufficient documentation

## 2015-12-01 DIAGNOSIS — R232 Flushing: Secondary | ICD-10-CM

## 2015-12-01 DIAGNOSIS — F3130 Bipolar disorder, current episode depressed, mild or moderate severity, unspecified: Secondary | ICD-10-CM

## 2015-12-01 DIAGNOSIS — K5900 Constipation, unspecified: Secondary | ICD-10-CM | POA: Insufficient documentation

## 2015-12-01 DIAGNOSIS — R32 Unspecified urinary incontinence: Secondary | ICD-10-CM | POA: Insufficient documentation

## 2015-12-01 DIAGNOSIS — Z7982 Long term (current) use of aspirin: Secondary | ICD-10-CM | POA: Insufficient documentation

## 2015-12-01 DIAGNOSIS — Z87891 Personal history of nicotine dependence: Secondary | ICD-10-CM | POA: Insufficient documentation

## 2015-12-01 DIAGNOSIS — F319 Bipolar disorder, unspecified: Secondary | ICD-10-CM | POA: Insufficient documentation

## 2015-12-01 DIAGNOSIS — E089 Diabetes mellitus due to underlying condition without complications: Secondary | ICD-10-CM

## 2015-12-01 DIAGNOSIS — R0600 Dyspnea, unspecified: Secondary | ICD-10-CM

## 2015-12-01 LAB — POCT URINALYSIS DIPSTICK
Blood, UA: NEGATIVE
Glucose, UA: NEGATIVE
LEUKOCYTES UA: NEGATIVE
NITRITE UA: NEGATIVE
PH UA: 5.5
PROTEIN UA: NEGATIVE
Spec Grav, UA: 1.015
Urobilinogen, UA: 4

## 2015-12-01 LAB — GLUCOSE, POCT (MANUAL RESULT ENTRY): POC GLUCOSE: 152 mg/dL — AB (ref 70–99)

## 2015-12-01 MED ORDER — HYDROXYZINE HCL 25 MG PO TABS: 25.0000 mg | ORAL_TABLET | Freq: Every day | ORAL | 2 refills | Status: DC

## 2015-12-01 MED ORDER — POLYETHYLENE GLYCOL 3350 17 GM/SCOOP PO POWD: 17.0000 g | Freq: Every day | ORAL | 0 refills | Status: DC

## 2015-12-01 MED ORDER — VENLAFAXINE HCL ER 37.5 MG PO CP24: 37.5000 mg | ORAL_CAPSULE | Freq: Every day | ORAL | 2 refills | Status: DC

## 2015-12-01 MED FILL — POLYETHYLENE GLYCOL 3350: 17 days supply | Qty: 255 | Fill #0

## 2015-12-01 MED FILL — VENLAFAXINE HCL ER 37.5 MG: 37.5 | 30 days supply | Qty: 30 | Fill #0

## 2015-12-01 MED FILL — hydrOXYzine HCL 25 MG TABS: 25 | 30 days supply | Qty: 30 | Fill #0

## 2015-12-01 NOTE — Progress Notes (Signed)
Pt hands and feet have been itching badly, pt needs medication for hot flashes, pt also states that she has been urinating on her self.

## 2015-12-01 NOTE — Progress Notes (Signed)
Subjective:  Patient ID: Lindsay Rivera, female    DOB: April 05, 1960  Age: 55 y.o. MRN: [id number]  CC: Diabetes   HPI Lindsay Rivera has CAD, HTN, DM2, HLD, hypothyroidism she  presents for   1. Urinary incontinence: x 3 weeks. No dysuria. Waking up multiple times at night to urinate. Also wetting herself during the day. She leaks urine when she coughs and laughs and sometimes leaks without coughing or laughing. She is s/p hysterectomy. She admits to some constipation.   2. Hot flashes: worsening and frequent hot flashes. Former smoker. Denies spicy foods. Drinks soda. Hot flashes are distress.   3. Itching in hands and feet: x 1 week. No rash. This has never happened before. No exposure to new products.   4. Dyspnea: persist. She gets shortness of breath walking short distances. She completed the ordered chest x-ray and it was normal. Her Hgb is 13.1 on BNP normal on last months check. She is a former smoker. She has known CAD. 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 She is compliant with statin, plavix, propranolol. No chest pain or cough. She has cardiology follow up on 02/11/2016.   Social History  Substance Use Topics  . Smoking status: Former Smoker    Packs/day: 0.12    Years: 38.00    Types: Cigarettes    Quit date: 02/08/2013  . Smokeless tobacco: Never Used  . Alcohol use 0.0 oz/week     Comment: 04/17/2015 "I'll have a glass of wine on birthdays, new year's, etc."    Outpatient Medications Prior to Visit  Medication Sig Dispense Refill  . albuterol (PROVENTIL HFA;VENTOLIN HFA) 108 (90 BASE) MCG/ACT inhaler Inhale 2 puffs into the lungs every 6 (six) hours as needed for wheezing. 3 Inhaler 2  . amLODipine (NORVASC) 5 MG tablet Take 1 tablet (5 mg total) by mouth daily. 90 tablet 3  . aspirin EC 81 MG tablet Take 1 tablet (81 mg total) by mouth daily.    Marland Kitchen  atorvastatin (LIPITOR) 80 MG tablet Take 1 tablet (80 mg total) by mouth at bedtime. 30 tablet 11  . buPROPion (WELLBUTRIN XL) 150 MG 24 hr tablet Take 1 tablet (150 mg total) by mouth every evening. 30 tablet 0  . Calcium Carbonate-Vitamin D 600-400 MG-UNIT per chew tablet Chew 1 tablet by mouth daily. Reported on 03/25/2015    . clopidogrel (PLAVIX) 75 MG tablet Take 1 tablet (75 mg total) by mouth daily. Take 4 tablets the first day then 1 tablet daily thereafter 94 tablet 3  . clotrimazole (LOTRIMIN) 1 % cream Apply 1 application topically 2 (two) times daily as needed. Reported on 07/09/2015 60 g 0  . diazepam (VALIUM) 5 MG tablet Take 5 mg by mouth 2 (two) times daily as needed for anxiety. Reported on 07/09/2015    . FLUoxetine (PROZAC) 20 MG tablet Take 20 mg by mouth daily.  2  . fluticasone (FLONASE) 50 MCG/ACT nasal spray Place 2 sprays into both nostrils daily. 16 g 3  . hydrochlorothiazide (HYDRODIURIL) 25 MG tablet Take 1 tablet (25 mg total) by mouth daily. 30 tablet 11  . hydrOXYzine (ATARAX/VISTARIL) 25 MG tablet Take 25 mg by mouth at bedtime.    Marland Kitchen levothyroxine (SYNTHROID, LEVOTHROID) 125 MCG tablet Take 1 tablet (125 mcg total) by mouth daily. 90 tablet 3  . lidocaine (LIDODERM) 5 % Place  1 patch onto the skin daily as needed (pain). Remove & Discard patch within 12 hours or as directed by MD    . metFORMIN (GLUCOPHAGE XR) 500 MG 24 hr tablet Take 1 tablet (500 mg total) by mouth daily with breakfast. 30 tablet 5  . NITROSTAT 0.4 MG SL tablet PLACE 1 TABLET UNDER THE TONGUE EVERY 5 MINS AS NEEDED FOR CHEST PAIN 25 tablet 0  . OLANZapine (ZYPREXA) 20 MG tablet Take 20 mg by mouth at bedtime.    . pantoprazole (PROTONIX) 40 MG tablet Take 1 tablet by mouth twice daily 60 tablet 2  . Polyethylene Glycol 3350 (MIRALAX PO) Take 17 g by mouth daily as needed. Constipation    . potassium chloride (K-DUR) 10 MEQ tablet Take 1 tablet (10 mEq total) by mouth daily. 30 tablet 11  . propranolol  (INDERAL) 20 MG tablet Take 1 tablet (20 mg total) by mouth 2 (two) times daily as needed (anxiety). 60 tablet 0  . QUEtiapine (SEROQUEL) 400 MG tablet Take 600 mg by mouth at bedtime.    Marland Kitchen QUEtiapine (SEROQUEL) 50 MG tablet Take 50 mg by mouth 2 (two) times daily.     No facility-administered medications prior to visit.     ROS Review of Systems  Constitutional: Negative for chills and fever.  Eyes: Negative for visual disturbance.  Respiratory: Positive for shortness of breath. Negative for cough.   Cardiovascular: Negative for chest pain.  Gastrointestinal: Positive for abdominal distention, abdominal pain and constipation. Negative for blood in stool, diarrhea, nausea and vomiting.  Endocrine: Positive for heat intolerance (hot flashes ).  Genitourinary: Positive for frequency.       Urinary incontinence   Musculoskeletal: Negative for arthralgias and back pain.  Skin: Negative for rash.  Allergic/Immunologic: Negative for immunocompromised state.  Hematological: Negative for adenopathy. Does not bruise/bleed easily.  Psychiatric/Behavioral: Positive for dysphoric mood. Negative for suicidal ideas.    Objective:  BP 105/73 (BP Location: Left Arm, Patient Position: Sitting, Cuff Size: Small)   Pulse 77   Temp 98.1 F (36.7 C) (Oral)   Ht 5\' 2"  (1.575 m)   Wt 166 lb (75.3 kg)   SpO2 98%   BMI 30.36 kg/m   BP/Weight 12/01/2015 10/23/2015 [id number]  Systolic BP [id number]  Diastolic BP 73 69 [id number]  Wt. (Lbs) 166 164.8 165.8  BMI 30.36 30.14 30.32    Physical Exam  Constitutional: She is oriented to person, place, and time. She appears well-developed and well-nourished. No distress.  HENT:  Head: Normocephalic and atraumatic.  Cardiovascular: Normal rate, regular rhythm, normal heart sounds and intact distal pulses.   Pulmonary/Chest: Effort normal and breath sounds normal.  Abdominal: Soft. Bowel sounds are normal. She exhibits no mass. There is no tenderness. There is  no rebound and no guarding.  Musculoskeletal: She exhibits no edema.  Neurological: She is alert and oriented to person, place, and time.  Skin: Skin is warm and dry. No rash noted.     Psychiatric: She has a normal mood and affect.   Lab Results  Component Value Date   HGBA1C 6.3 10/23/2015   CBG 152  UA normal  Assessment & Plan:   Ninfa was seen today for diabetes.  Diagnoses and all orders for this visit:  Diabetes mellitus due to underlying condition without complication, without long-term current use of insulin (HCC) -     POCT glucose (manual entry)  Urinary incontinence, unspecified type -     POCT urinalysis  dipstick  Dyspnea, unspecified type -     Ambulatory referral to Pulmonology -     Pulmonary function test; Future  Itching -     hydrOXYzine (ATARAX/VISTARIL) 25 MG tablet; Take 1 tablet (25 mg total) by mouth at bedtime.  Hot flashes -     venlafaxine XR (EFFEXOR XR) 37.5 MG 24 hr capsule; Take 1 capsule (37.5 mg total) by mouth daily with breakfast.  Bipolar affective disorder, current episode depressed, current episode severity unspecified (HCC) -     venlafaxine XR (EFFEXOR XR) 37.5 MG 24 hr capsule; Take 1 capsule (37.5 mg total) by mouth daily with breakfast.  Constipation, unspecified constipation type -     polyethylene glycol powder (MIRALAX) powder; Take 17 g by mouth daily. Constipation   No orders of the defined types were placed in this encounter.   Follow-up: Return in about 4 weeks (around 12/29/2015) for stress and urge incontinence .   Boykin Nearing MD

## 2015-12-01 NOTE — Patient Instructions (Addendum)
Lindsay Rivera was seen today for diabetes.  Diagnoses and all orders for this visit:  Diabetes mellitus due to underlying condition without complication, without long-term current use of insulin (HCC) -     POCT glucose (manual entry)  Urinary incontinence, unspecified type -     POCT urinalysis dipstick  Dyspnea, unspecified type -     Ambulatory referral to Pulmonology -     Pulmonary function test; Future  Itching -     hydrOXYzine (ATARAX/VISTARIL) 25 MG tablet; Take 1 tablet (25 mg total) by mouth at bedtime.  Hot flashes -     venlafaxine XR (EFFEXOR XR) 37.5 MG 24 hr capsule; Take 1 capsule (37.5 mg total) by mouth daily with breakfast.  Bipolar affective disorder, current episode depressed, current episode severity unspecified (HCC) -     venlafaxine XR (EFFEXOR XR) 37.5 MG 24 hr capsule; Take 1 capsule (37.5 mg total) by mouth daily with breakfast.  Constipation, unspecified constipation type -     polyethylene glycol powder (MIRALAX) powder; Take 17 g by mouth daily. Constipation  Stop Prozac and start Effexor  Start kegels to try to strengthening pelvic floor and prevent   F/u in 4 weeks for pelvic exam to determine of vaginal estrogen will help with incontinence (mixed stress and urge)   Dr. Adrian Blackwater   Patient information: Pelvic muscle (Kegel) exercises (The Basics) View in SpanishWritten by the doctors and editors at [employer]  What are pelvic muscle exercises? - Pelvic muscle exercises are exercises that strengthen the muscles that control the flow of urine and bowel movements. These exercises are also known as "Kegel" exercises. They can help keep you from leaking urine, gas, or bowel movements, if leaks are  How do I learn how to do Kegel exercises? - First ask your doctor or nurse how to do them right. He or she can help you get started. You will need to learn which muscles to tighten. It is sometimes hard to figure out the right muscles.  A woman might learn to do  Kegel exercises by:  ?Putting a finger inside her vagina and squeezing the muscles around her finger; or  ?Pretending that she is sitting on a marble and has to pick up the marble using her vagina A man might learn to do Kegels by tightening his butt muscles as if he were holding back gas.  Both men and women can also learn to do Kegel exercises by stopping and starting the flow of urine. If you do this, make sure to do this only once or twice to figure out the correct muscles. Some doctors think you should not do this at all, because if you get in the habit of doing it, it could cause a bladder infection.  No matter how you learn to do Kegel exercises, the important thing to know is that the muscles involved are not in your belly or your thighs.  After you learn which muscles to tighten, you can do the exercises in any position (sitting in a chair or lying down). You do not need to do them while you are in the bathroom.   How often should I do the exercises? - Do the exercises 3 times a day, on 3 or 4 days a week. Each time, flex your muscles 8 to 12 times, and hold them tight for 6 to 8 seconds each time you tighten. Keep up this routine for at least 3 to 4 months. You will probably notice results, but it  might take a little time.  How do Kegel exercises help? - Kegel exercises can help: ?Reduce urine leaks in people who have "stress incontinence," which means they leak urine when they cough, laugh, sneeze, or strain ?Control sudden urges to urinate that happen to people with "urgency incontinence." (Urgency incontinence is also known as urge incontinence.) ?Control the release of gas or bowel movements ?Reduce pressure or bulging in the vagina caused by pelvic organ prolapse. (If you have a bulge in the vagina, see your doctor or nurse to find out the cause.)  Kegel exercises might also reduce urine leaks in men who have had surgery to treat prostate cancer or an enlarged prostate.     Overactive Bladder, Adult Overactive bladder is a group of urinary symptoms. With overactive bladder, you may suddenly feel the need to pass urine (urinate) right away. After feeling this sudden urge, you might also leak urine if you cannot get to the bathroom fast enough (urinary incontinence). These symptoms might interfere with your daily work or social activities. Overactive bladder symptoms may also wake you up at night. Overactive bladder affects the nerve signals between your bladder and your brain. Your bladder may get the signal to empty before it is full. Very sensitive muscles can also make your bladder squeeze too soon. CAUSES Many things can cause an overactive bladder. Possible causes include:  Urinary tract infection.  Infection of nearby tissues, such as the prostate.  Prostate enlargement.  Being pregnant with twins or more (multiples).  Surgery on the uterus or urethra.  Bladder stones, inflammation, or tumors.  Drinking too much caffeine or alcohol.  Certain medicines, especially those that you take to help your body get rid of extra fluid (diuretics) by increasing urine production.  Muscle or nerve weakness, especially from:  A spinal cord injury.  Stroke.  Multiple sclerosis.  Parkinson disease.  Diabetes. This can cause a high urine volume that fills the bladder so quickly that the normal urge to urinate is triggered very strongly.  Constipation. A buildup of too much stool can put pressure on your bladder. RISK FACTORS You may be at greater risk for overactive bladder if you:  Are an older adult.  Smoke.  Are going through menopause.  Have prostate problems.  Have a neurological disease, such as stroke, dementia, Parkinson disease, or multiple sclerosis (MS).  Eat or drink things that irritate the bladder. These include alcohol, spicy food, and caffeine.  Are overweight or obese. SIGNS AND SYMPTOMS  The signs and symptoms of an  overactive bladder include:  Sudden, strong urges to urinate.  Leaking urine.  Urinating eight or more times per day.  Waking up to urinate two or more times per night. DIAGNOSIS Your health care provider may suspect overactive bladder based on your symptoms. The health care provider will do a physical exam and take your medical history. Blood or urine tests may also be done. For example, you might need to have a bladder function test to check how well you can hold your urine. You might also need to see a health care provider who specializes in the urinary tract (urologist). TREATMENT Treatment for overactive bladder depends on the cause of your condition and whether it is mild or severe. Certain treatments can be done in your health care provider's office or clinic. You can also make lifestyle changes at home. Options include: Behavioral Treatments  Biofeedback. A specialist uses sensors to help you become aware of your body's signals.  Keeping  a daily log of when you need to urinate and what happens after the urge. This may help you manage your condition.  Bladder training. This helps you learn to control the urge to urinate by following a schedule that directs you to urinate at regular intervals (timed voiding). At first, you might have to wait a few minutes after feeling the urge. In time, you should be able to schedule bathroom visits an hour or more apart.  Kegel exercises. These are exercises to strengthen the pelvic floor muscles, which support the bladder. Toning these muscles can help you control urination, even if your bladder muscles are overactive. A specialist will teach you how to do these exercises correctly. They require daily practice.  Weight loss. If you are obese or overweight, losing weight might relieve your symptoms of overactive bladder. Talk to your health care provider about losing weight and whether there is a specific program or method that would work best for  you.  Diet change. This might help if constipation is making your overactive bladder worse. Your health care provider or a dietitian can explain ways to change what you eat to ease constipation. You might also need to consume less alcohol and caffeine or drink other fluids at different times of the day.  Stopping smoking.  Wearing pads to absorb leakage while you wait for other treatments to take effect. Physical Treatments  Electrical stimulation. Electrodes send gentle pulses of electricity to strengthen the nerves or muscles that help to control the bladder. Sometimes, the electrodes are placed outside of the body. In other cases, they might be placed inside the body (implanted). This treatment can take several months to have an effect.  Supportive devices. Women may need a plastic device that fits into the vagina and supports the bladder (pessary). Medicines Several medicines can help treat overactive bladder and are usually used along with other treatments. Some are injected into the muscles involved in urination. Others come in pill form. Your health care provider may prescribe:  Antispasmodics. These medicines block the signals that the nerves send to the bladder. This keeps the bladder from releasing urine at the wrong time.  Tricyclic antidepressants. These types of antidepressants also relax bladder muscles. Surgery  You may have a device implanted to help manage the nerve signals that indicate when you need to urinate.  You may have surgery to implant electrodes for electrical stimulation.  Sometimes, very severe cases of overactive bladder require surgery to change the shape of the bladder. HOME CARE INSTRUCTIONS   Take medicines only as directed by your health care provider.  Use any implants or a pessary as directed by your health care provider.  Make any diet or lifestyle changes that are recommended by your health care provider. These might include:  Drinking less  fluid or drinking at different times of the day. If you need to urinate often during the night, you may need to stop drinking fluids early in the evening.  Cutting down on caffeine or alcohol. Both can make an overactive bladder worse. Caffeine is found in coffee, tea, and sodas.  Doing Kegel exercises to strengthen muscles.  Losing weight if you need to.  Eating a healthy and balanced diet to prevent constipation.  Keep a journal or log to track how much and when you drink and also when you feel the need to urinate. This will help your health care provider to monitor your condition. SEEK MEDICAL CARE IF:  Your symptoms do not get  better after treatment.  Your pain and discomfort are getting worse.  You have more frequent urges to urinate.  You have a fever. SEEK IMMEDIATE MEDICAL CARE IF: You are not able to control your bladder at all.   This information is not intended to replace advice given to you by your health care provider. Make sure you discuss any questions you have with your health care provider.   Document Released: 11/20/2008 Document Revised: 02/14/2014 Document Reviewed: 06/19/2013 Elsevier Interactive Patient Education [employer].    F/u in   Dr. Adrian Blackwater

## 2015-12-02 ENCOUNTER — Telehealth: Payer: Self-pay | Admitting: Family Medicine

## 2015-12-02 DIAGNOSIS — R32 Unspecified urinary incontinence: Secondary | ICD-10-CM | POA: Insufficient documentation

## 2015-12-02 DIAGNOSIS — L299 Pruritus, unspecified: Secondary | ICD-10-CM | POA: Insufficient documentation

## 2015-12-02 NOTE — Assessment & Plan Note (Signed)
Incontinence with features of stress and urge incontinence No HCTZ No evidence of UTI  Plan: kegels miralax for constipation Stop HCTZ at f/u if no improvement and check pelvic to determine of vaginal estrogen would help

## 2015-12-02 NOTE — Assessment & Plan Note (Signed)
A: patient undergoing menopause with bothersome hot flashes. Has bipolar disorder and on low dose prozac  P: Transition from prozac to effexor to help with hot flashes Avoid known triggers If no significant improvement with refer to gyn for possible estrogen replacement therapy

## 2015-12-02 NOTE — Telephone Encounter (Signed)
Given patient's shortness of breath with exertion and negative work up so far,  along with pulmonology referral I would also like her to see her cardiologist sooner that her routine follow up.  Please inform patient and facilitate appt if needed She sees Dr. Acie Fredrickson.

## 2015-12-02 NOTE — Assessment & Plan Note (Signed)
Itching with no rash  Refilled atarax

## 2015-12-02 NOTE — Assessment & Plan Note (Signed)
A: dyspnea in former smoker, known CAD. Normal CXR, Hgb and BNP P: PFTs and pulm referral Patient advised to move up cardiology follow up Noted routed to her cardiologist to facilitate sooner follow up.

## 2015-12-03 NOTE — Telephone Encounter (Signed)
No voicemail option 

## 2015-12-04 NOTE — Telephone Encounter (Signed)
Unsuccessful call attempt to patient at [phone], phone rang 5 times no answer/no voicemail option.  Letter sent to patient requested return call with appropriate contact number and the best time she can be reached. Priscille Heidelberg, RN, BSN

## 2015-12-08 NOTE — Telephone Encounter (Signed)
Patient called and received message from the doctor. Please follow up.

## 2015-12-08 NOTE — Telephone Encounter (Signed)
Patient hipaa verif per guidelines.  Rn advised patient per Dr. Adrian Blackwater Given patient's shortness of breath with exertion and negative work up so far,  along with pulmonology referral I would also like her to see her cardiologist sooner that her routine follow up.  Please inform patient and facilitate appt if needed She sees Dr. Acie Fredrickson.   Patient verbalized understanding no further questions at this time.

## 2015-12-08 NOTE — Telephone Encounter (Signed)
Noted! Thank you

## 2015-12-11 ENCOUNTER — Ambulatory Visit (HOSPITAL_COMMUNITY)
Admission: RE | Admit: 2015-12-11 | Discharge: 2015-12-11 | Disposition: A | Discharge status: Home / Self Care | Payer: No Typology Code available for payment source | Source: Ambulatory Visit | Attending: Family Medicine | Admitting: Family Medicine

## 2015-12-11 DIAGNOSIS — R0600 Dyspnea, unspecified: Secondary | ICD-10-CM | POA: Insufficient documentation

## 2015-12-11 LAB — PULMONARY FUNCTION TEST
DL/VA % pred: 89 %
DL/VA: 4.07 ml/min/mmHg/L
DLCO unc % pred: 70 %
DLCO unc: 15.13 ml/min/mmHg
FEF 25-75 PRE: 2.3 L/s
FEF 25-75 Post: 3.42 L/sec
FEF2575-%CHANGE-POST: 49 %
FEF2575-%Pred-Post: 163 %
FEF2575-%Pred-Pre: 109 %
FEV1-%CHANGE-POST: 0 %
FEV1-%PRED-POST: 110 %
FEV1-%Pred-Pre: 109 %
FEV1-POST: 2.23 L
FEV1-PRE: 2.21 L
FEV1FVC-%CHANGE-POST: 5 %
FEV1FVC-%Pred-Pre: 109 %
FEV6-%Change-Post: -5 %
FEV6-%PRED-POST: 97 %
FEV6-%Pred-Pre: 103 %
FEV6-PRE: 2.54 L
FEV6-Post: 2.4 L
FEV6FVC-%PRED-PRE: 103 %
FEV6FVC-%Pred-Post: 103 %
FVC-%CHANGE-POST: -4 %
FVC-%Pred-Post: 95 %
FVC-%Pred-Pre: 99 %
FVC-Post: 2.41 L
FVC-Pre: 2.54 L
POST FEV1/FVC RATIO: 92 %
PRE FEV6/FVC RATIO: 100 %
Post FEV6/FVC ratio: 100 %
Pre FEV1/FVC ratio: 87 %
RV % PRED: 112 %
RV: 2.02 L
TLC % pred: 96 %
TLC: 4.57 L

## 2015-12-11 MED ORDER — ALBUTEROL SULFATE (2.5 MG/3ML) 0.083% IN NEBU
2.5000 mg | INHALATION_SOLUTION | Freq: Once | RESPIRATORY_TRACT | Status: AC
  Administered 2015-12-11: 2.5 mg via RESPIRATORY_TRACT

## 2015-12-14 ENCOUNTER — Ambulatory Visit: Payer: No Typology Code available for payment source | Attending: Family Medicine

## 2015-12-14 MED FILL — AMLODIPINE BESYLATE 5 MG TA: 5 | 30 days supply | Qty: 30 | Fill #7

## 2015-12-14 MED FILL — POTASSIUM CL 10 MEQ TAB SA: 10 | 30 days supply | Qty: 30 | Fill #4

## 2015-12-23 ENCOUNTER — Ambulatory Visit (INDEPENDENT_AMBULATORY_CARE_PROVIDER_SITE_OTHER): Payer: No Typology Code available for payment source | Admitting: Cardiovascular Disease

## 2015-12-23 ENCOUNTER — Encounter (INDEPENDENT_AMBULATORY_CARE_PROVIDER_SITE_OTHER): Payer: Self-pay

## 2015-12-23 ENCOUNTER — Encounter: Payer: Self-pay | Admitting: Cardiovascular Disease

## 2015-12-23 VITALS — BP 106/80 | HR 60 | Ht 62.0 in | Wt 165.4 lb

## 2015-12-23 DIAGNOSIS — I2510 Atherosclerotic heart disease of native coronary artery without angina pectoris: Secondary | ICD-10-CM

## 2015-12-23 DIAGNOSIS — I25119 Atherosclerotic heart disease of native coronary artery with unspecified angina pectoris: Secondary | ICD-10-CM

## 2015-12-23 DIAGNOSIS — I10 Essential (primary) hypertension: Secondary | ICD-10-CM

## 2015-12-23 MED FILL — FLUoxetine HCL 20 MG CAPS: 20 | 30 days supply | Qty: 90 | Fill #0

## 2015-12-23 MED FILL — OLANZapine 15 MG TABS: 15 | 30 days supply | Qty: 60 | Fill #0

## 2015-12-23 MED FILL — PROPRANOLOL 20 MG TABLET: 20 | 30 days supply | Qty: 60 | Fill #0

## 2015-12-23 MED FILL — ?BUPROPION HCL SR 150 MG TA: 150 | 30 days supply | Qty: 60 | Fill #0

## 2015-12-23 MED FILL — HYDROXYZINE PAM 50 MG CAP: 50 | 30 days supply | Qty: 60 | Fill #0

## 2015-12-23 NOTE — Patient Instructions (Signed)
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
## 2015-12-25 ENCOUNTER — Encounter: Payer: Self-pay | Admitting: Internal Medicine

## 2015-12-25 ENCOUNTER — Ambulatory Visit (INDEPENDENT_AMBULATORY_CARE_PROVIDER_SITE_OTHER): Payer: No Typology Code available for payment source | Admitting: Internal Medicine

## 2015-12-25 VITALS — BP 130/80 | HR 82 | Ht 62.0 in | Wt 167.2 lb

## 2015-12-25 DIAGNOSIS — Z23 Encounter for immunization: Secondary | ICD-10-CM

## 2015-12-25 DIAGNOSIS — R0602 Shortness of breath: Secondary | ICD-10-CM

## 2015-12-25 MED ORDER — FAMOTIDINE 20 MG PO TABS: ORAL_TABLET | ORAL | 2 refills | Status: DC

## 2015-12-25 MED FILL — FAMOTIDINE 20 MG TABLET: 20 | 30 days supply | Qty: 30 | Fill #0

## 2015-12-25 NOTE — Progress Notes (Signed)
Subjective:    Patient ID: Lindsay Rivera, female    DOB: 13-Apr-1960,     MRN: [id number]  HPI  89 yobf quit smoking 2015  At 180lb/ was  HS Cheer[employer] with good ex tol but onset   sob/cough 2009  And placed on advair then much worse sob since cabg Jan 2015 and not right since so referred to pulmonary clinic 12/25/2015 by Dr   Adrian Blackwater    12/25/2015 1st [facility] office visit/ Mikalah Skyles   Chief Complaint  Patient presents with  . Pulmonary Consult    Referred by Dr. Adrian Blackwater. Pt c/o SOB since MI in Jan 2015. She states that she gets out of breath just walking from room to room.  She also c/o CP that comes and goes and non prod cough.  She states she can not lie flat and has to sleep propped up due to SOB.  She is using albuterol inhaler 2 x daily on average.   last time could lie flat was prior to cabg and since then needs 30 degrees or immediate smothering and also with bending over Cough is better since quit smoking Thyroid surgery [place] "it was pushing in on my throat" but still has sense can't swallow despite ppi bid (not ac) and breathing seems  Better on saba " until I exert again" (even w/in 4 h of saba) Very confused with meds, names them according to what they do and not clear whether really takes inderal or not  No obvious day to day or daytime variability or assoc excess/ purulent sputum or mucus plugs or hemoptysis or cp or chest tightness, subjective wheeze or overt sinus or hb symptoms. No unusual exp hx or h/o childhood pna/ asthma or knowledge of premature birth.  Sleeping ok without nocturnal  or early am exacerbation  of respiratory  c/o's or need for noct saba. Also denies any obvious fluctuation of symptoms with weather or environmental changes or other aggravating or alleviating factors except as outlined above   Current Medications, Allergies, Complete Past Medical History, Past Surgical History, Family History, and Social History were reviewed in [employer] record.           Review of Systems  Constitutional: Negative for chills, fever and unexpected weight change.  HENT: Positive for trouble swallowing. Negative for congestion, dental problem, ear pain, nosebleeds, postnasal drip, rhinorrhea, sinus pressure, sneezing, sore throat and voice change.   Eyes: Negative for visual disturbance.  Respiratory: Positive for cough and shortness of breath. Negative for choking.   Cardiovascular: Negative for chest pain and leg swelling.  Gastrointestinal: Negative for abdominal pain, diarrhea and vomiting.  Genitourinary: Negative for difficulty urinating.  Musculoskeletal: Positive for arthralgias.  Skin: Negative for rash.  Neurological: Negative for tremors, syncope and headaches.  Hematological: Does not bruise/bleed easily.       Objective:   Physical Exam  amb bf nad extremely poor insight into meds  Wt Readings from Last 3 Encounters:  12/25/15 167 lb 3.2 oz (75.8 kg)  12/23/15 165 lb 6.4 oz (75 kg)  12/01/15 166 lb (75.3 kg)    Vital signs reviewed - Note on arrival 02 sats  99% on RA     HEENT: nl  turbinates, and oropharynx. Nl external ear canals without cough reflex - very poor dentition    NECK :  without JVD/Nodes/TM/ nl carotid upstrokes bilaterally   LUNGS: no acc muscle use,  Nl contour chest which is  clear to A and P bilaterally without cough on insp or exp maneuvers   CV:  RRR  no s3 or murmur or increase in P2, no edema   ABD:  Obese but soft and nontender with limited inspiratory excursion in the supine position. No bruits or organomegaly, bowel sounds nl  MS:  Nl gait/ ext warm without deformities, calf tenderness, cyanosis or clubbing No obvious joint restrictions   SKIN: warm and dry without lesions    NEURO:  alert, approp, nl sensorium with  no motor deficits     I personally reviewed images and agree with radiology impression as follows:  CTa Chest  04/19/15  No evidence of  pulmonary embolism.  No evidence of acute cardiopulmonary disease.              Assessment & Plan:

## 2015-12-25 NOTE — Patient Instructions (Addendum)
You need an alternative to Inderal (propranonol)   Pantoprazole 40 mg (acid reflux pill) Take 30- 60 min before your first and last meals of the day and Pepcid 20 mg one at bedtime  GERD (REFLUX)  is an extremely common cause of respiratory symptoms just like yours , many times with no obvious heartburn at all.    It can be treated with medication, but also with lifestyle changes including elevation of the head of your bed (ideally with 6 inch  bed blocks),  Smoking cessation, avoidance of late meals, excessive alcohol, and avoid fatty foods, chocolate, peppermint, colas, red wine, and acidic juices such as orange juice.  NO MINT OR MENTHOL PRODUCTS SO NO COUGH DROPS   USE SUGARLESS CANDY INSTEAD (Jolley ranchers or Stover's or Life Savers) or even ice chips will also do - the key is to swallow to prevent all throat clearing. NO OIL BASED VITAMINS - use powdered substitutes.    Weight control is simply a matter of calorie balance which needs to be tilted in your favor by eating less and exercising more.  To get the most out of exercise, you need to be continuously aware that you are short of breath, but never out of breath, for 30 minutes daily. As you improve, it will actually be easier for you to do the same amount of exercise  in  30 minutes so always push to the level where you are short of breath.  If this does not result in gradual weight reduction then I strongly recommend you see a nutritionist with a food diary x 2 weeks so that we can work out a negative calorie balance which is universally effective in steady weight loss programs.  Think of your calorie balance like you do your bank account where in this case you want the balance to go down so you must take in less calories than you burn up.  It's just that simple:  Hard to do, but easy to understand.  Good luck!   Please see patient coordinator before you leave today  to schedule echocardiogram   See Tammy NP w/in 2 weeks with all your  medications, even over the counter meds, separated in two separate bags, the ones you take no matter what vs the ones you stop once you feel better and take only as needed when you feel you need them.   Tammy  will generate for you a new user friendly medication calendar that will put [place] all on the same page re: your medication use.     Without this process, it simply isn't possible to assure that we are providing  your outpatient care  with  the attention to detail we feel you deserve.   If we cannot assure that you're getting that kind of care,  then we cannot manage your problem effectively from this clinic.  Once you have seen Tammy and we are sure that we're all on the same page with your medication use she will arrange follow up with me.

## 2015-12-26 NOTE — Assessment & Plan Note (Addendum)
Spirometry 12/11/15 wnl except for truncated exp portion of f/v loop (note not clear whether took inderal first) - 12/25/2015   Walked RA  2 laps @ 185 ft each stopped due to  Sob at fast pace, sats 100% at end   Symptoms are markedly disproportionate to objective findings and not clear this is a lung problem but pt does appear to have difficult airway management issues. DDX of  difficult airways management almost all start with A and  include Adherence, Ace Inhibitors, Acid Reflux, Active Sinus Disease, Alpha 1 Antitripsin deficiency, Anxiety masquerading as Airways dz,  ABPA,  Allergy(esp in young), Aspiration (esp in elderly), Adverse effects of meds,  Active smokers, A bunch of PE's (a small clot burden can't cause this syndrome unless there is already severe underlying pulm or vascular dz with poor reserve) plus two Bs  = Bronchiectasis and Beta blocker use..and one C= CHF  Adherence is always the initial "prime suspect" and is a multilayered concern that requires a "trust but verify" approach in every patient - starting with knowing how to use medications, especially inhalers, correctly, keeping up with refills and understanding the fundamental difference between maintenance and prns vs those medications only taken for a very short course and then stopped and not refilled.  - clearly in desperate need for medication reconciliation.  To keep things simple, I have asked the patient to first separate medicines that are perceived as maintenance, that is to be taken daily "no matter what", from those medicines that are taken on only on an as-needed basis and I have given the patient examples of both, and then return to see our NP to generate a  detailed  medication calendar which should be followed until the next physician sees the patient and updates it.    ? Acid (or non-acid) GERD > always difficult to exclude as up to 75% of pts in some series report no assoc GI/ Heartburn symptoms> rec max (24h)  acid  suppression and diet restrictions/ reviewed and instructions given in writing.   ? Anxiety related to obesity/ deconditioning ? VCD  > usually at the bottom of this list of usual suspects but should be much higher on this pt's based on H and P and note already on psychotropics .  ? Allergy/ asthma > not supported by hx/ concern perception of need for saba may related to inderal - see below    ? A bunch of PEs > neg CTa 04/2015 for same symptoms  ? Beta blockers: Strongly prefer in this setting: Bystolic, the most beta -1  selective Beta blocker available in sample form, with bisoprolol the most selective generic choice  on the market.   ? CHF > excluded by cards/ LVEDP 18 at last [facility] 04/25/15

## 2015-12-26 NOTE — Assessment & Plan Note (Signed)
Body mass index is 30.58  Lab Results  Component Value Date   TSH 5.39 (H) 10/23/2015     Contributing to gerd tendency/ doe/reviewed the need and the process to achieve and maintain neg calorie balance > defer f/u primary care including intermittently monitoring thyroid status

## 2016-01-08 ENCOUNTER — Encounter: Payer: No Typology Code available for payment source | Admitting: Adult Health

## 2016-01-11 MED FILL — VENLAFAXINE HCL ER 37.5 MG: 37.5 | 30 days supply | Qty: 30 | Fill #1

## 2016-01-11 MED FILL — HYDROCHLOROTHIAZIDE 25 MG T: 25 | 30 days supply | Qty: 30 | Fill #4

## 2016-01-11 MED FILL — ?HYDROXYZINE HCL 25 MG TAB: 25 | 30 days supply | Qty: 30 | Fill #1

## 2016-01-11 MED FILL — OLANZapine 20 MG TABS: 20 | 30 days supply | Qty: 60 | Fill #1

## 2016-01-11 MED FILL — ATORVASTATIN 80 MG TABLET: 80 | 30 days supply | Qty: 30 | Fill #7

## 2016-01-11 MED FILL — ?LEVOTHYROXINE 125 MCG TABL: 125 | 30 days supply | Qty: 30 | Fill #2

## 2016-01-11 MED FILL — FLUTICASONE PROP 50 MCG SPR: 50 | 30 days supply | Qty: 16 | Fill #2

## 2016-01-13 ENCOUNTER — Ambulatory Visit (HOSPITAL_COMMUNITY)
Admission: RE | Admit: 2016-01-13 | Discharge: 2016-01-13 | Disposition: A | Discharge status: Home / Self Care | Payer: Self-pay | Source: Ambulatory Visit | Attending: Internal Medicine | Admitting: Internal Medicine

## 2016-01-13 DIAGNOSIS — R0602 Shortness of breath: Secondary | ICD-10-CM | POA: Insufficient documentation

## 2016-01-13 NOTE — Progress Notes (Signed)
  Echocardiogram 2D Echocardiogram has been performed.  Lindsay Rivera 01/13/2016, 9:38 AM

## 2016-01-14 NOTE — Progress Notes (Signed)
Spoke with pt and notified of results per Dr. Wert. Pt verbalized understanding and denied any questions. 

## 2016-01-21 ENCOUNTER — Encounter: Payer: Self-pay | Admitting: Family Medicine

## 2016-01-21 NOTE — Progress Notes (Signed)
documented PHQ-9 from 12/11/207 faxed from P for [facility]

## 2016-01-28 ENCOUNTER — Ambulatory Visit: Payer: No Typology Code available for payment source | Admitting: Internal Medicine

## 2016-02-10 MED FILL — FAMOTIDINE 20 MG TABLET: 20 | 30 days supply | Qty: 30 | Fill #1

## 2016-02-10 MED FILL — VENLAFAXINE HCL ER 37.5 MG: 37.5 | 30 days supply | Qty: 30 | Fill #2

## 2016-02-10 MED FILL — OLANZapine 20 MG TABS: 20 | 30 days supply | Qty: 60 | Fill #2

## 2016-02-10 MED FILL — PANTOPRAZOLE SOD DR 40 MG T: 40 | 30 days supply | Qty: 60 | Fill #1

## 2016-02-10 MED FILL — FLUoxetine HCL 20 MG CAPS: 20 | 30 days supply | Qty: 90 | Fill #1

## 2016-02-10 MED FILL — BUPROPION SR 150 MG TABLET: 150 | 30 days supply | Qty: 60 | Fill #1

## 2016-02-10 MED FILL — ?HYDROXYZINE HCL 25 MG TAB: 25 | 30 days supply | Qty: 30 | Fill #2

## 2016-02-10 MED FILL — CLOPIDOGREL 75 MG TABLET: 75 | 90 days supply | Qty: 94 | Fill #2

## 2016-02-10 MED FILL — ?AMLODIPINE BESYLATE 5 MG T: 5 | 30 days supply | Qty: 30 | Fill #8

## 2016-02-10 MED FILL — ATORVASTATIN 80 MG TABLET: 80 | 30 days supply | Qty: 30 | Fill #8

## 2016-02-10 MED FILL — ?LEVOTHYROXINE 125 MCG TABL: 125 | 30 days supply | Qty: 30 | Fill #3

## 2016-02-10 MED FILL — HYDROCHLOROTHIAZIDE 25 MG T: 25 | 30 days supply | Qty: 30 | Fill #5

## 2016-02-10 MED FILL — POTASSIUM CL 10 MEQ TAB SA: 10 | 30 days supply | Qty: 30 | Fill #5

## 2016-02-11 ENCOUNTER — Ambulatory Visit: Payer: No Typology Code available for payment source | Admitting: Cardiovascular Disease

## 2016-02-17 MED FILL — ?BUPROPION HCL SR 100 MG TA: 100 | 30 days supply | Qty: 15 | Fill #0

## 2016-02-17 MED FILL — PROPRANOLOL 20 MG TABLET: 20 | 30 days supply | Qty: 60 | Fill #0

## 2016-02-17 MED FILL — HYDROXYZINE PAM 50 MG CAP: 50 | 30 days supply | Qty: 60 | Fill #0

## 2016-02-19 ENCOUNTER — Ambulatory Visit: Payer: No Typology Code available for payment source | Attending: Internal Medicine

## 2016-02-22 ENCOUNTER — Ambulatory Visit: Payer: No Typology Code available for payment source | Attending: Family Medicine | Admitting: Family Medicine

## 2016-02-22 ENCOUNTER — Encounter: Payer: Self-pay | Admitting: Family Medicine

## 2016-02-22 VITALS — BP 132/87 | HR 66 | Temp 98.3°F | Ht 62.0 in | Wt 169.2 lb

## 2016-02-22 DIAGNOSIS — I2510 Atherosclerotic heart disease of native coronary artery without angina pectoris: Secondary | ICD-10-CM | POA: Insufficient documentation

## 2016-02-22 DIAGNOSIS — Z7984 Long term (current) use of oral hypoglycemic drugs: Secondary | ICD-10-CM | POA: Insufficient documentation

## 2016-02-22 DIAGNOSIS — E089 Diabetes mellitus due to underlying condition without complications: Secondary | ICD-10-CM

## 2016-02-22 DIAGNOSIS — R32 Unspecified urinary incontinence: Secondary | ICD-10-CM

## 2016-02-22 DIAGNOSIS — E038 Other specified hypothyroidism: Secondary | ICD-10-CM

## 2016-02-22 DIAGNOSIS — N951 Menopausal and female climacteric states: Secondary | ICD-10-CM | POA: Insufficient documentation

## 2016-02-22 DIAGNOSIS — E119 Type 2 diabetes mellitus without complications: Secondary | ICD-10-CM

## 2016-02-22 DIAGNOSIS — Z79899 Other long term (current) drug therapy: Secondary | ICD-10-CM | POA: Insufficient documentation

## 2016-02-22 DIAGNOSIS — Z23 Encounter for immunization: Secondary | ICD-10-CM

## 2016-02-22 DIAGNOSIS — R232 Flushing: Secondary | ICD-10-CM

## 2016-02-22 DIAGNOSIS — Z7902 Long term (current) use of antithrombotics/antiplatelets: Secondary | ICD-10-CM | POA: Insufficient documentation

## 2016-02-22 DIAGNOSIS — Z87891 Personal history of nicotine dependence: Secondary | ICD-10-CM | POA: Insufficient documentation

## 2016-02-22 DIAGNOSIS — I10 Essential (primary) hypertension: Secondary | ICD-10-CM

## 2016-02-22 DIAGNOSIS — N3281 Overactive bladder: Secondary | ICD-10-CM | POA: Insufficient documentation

## 2016-02-22 DIAGNOSIS — Z7982 Long term (current) use of aspirin: Secondary | ICD-10-CM | POA: Insufficient documentation

## 2016-02-22 LAB — POCT URINALYSIS DIPSTICK
Bilirubin, UA: NEGATIVE
Blood, UA: NEGATIVE
GLUCOSE UA: NEGATIVE
Ketones, UA: NEGATIVE
Leukocytes, UA: NEGATIVE
Nitrite, UA: NEGATIVE
Protein, UA: NEGATIVE
SPEC GRAV UA: 1.015
UROBILINOGEN UA: 1
pH, UA: 6

## 2016-02-22 LAB — TSH: TSH: 4.15 mIU/L

## 2016-02-22 LAB — GLUCOSE, POCT (MANUAL RESULT ENTRY): POC Glucose: 114 mg/dl — AB (ref 70–99)

## 2016-02-22 MED ORDER — PAROXETINE HCL 10 MG PO TABS: 10.0000 mg | ORAL_TABLET | Freq: Every day | ORAL | 5 refills | Status: DC

## 2016-02-22 MED ORDER — SOLIFENACIN SUCCINATE 5 MG PO TABS: 5.0000 mg | ORAL_TABLET | Freq: Every day | ORAL | 2 refills | Status: DC

## 2016-02-22 MED FILL — VESIcare 5 MG TABS: 5 | 30 days supply | Qty: 30 | Fill #0

## 2016-02-22 MED FILL — PARoxetine HCL 10 MG TABS: 10 | 30 days supply | Qty: 30 | Fill #0

## 2016-02-22 NOTE — Assessment & Plan Note (Signed)
BP at goal. Continue current regimen.  

## 2016-02-22 NOTE — Assessment & Plan Note (Signed)
Controlled. Continue current regimen. 

## 2016-02-22 NOTE — Progress Notes (Signed)
Subjective:  Patient ID: Lindsay Rivera, female    DOB: Jan 13, 1961  Age: 56 y.o. MRN: [id number]  CC: Diabetes; Hypertension; and Hypothyroidism ypothyroidism   HPI Lindsay Rivera presents for    1. Hypothyroidism:  2. CHRONIC DIABETES  Disease Monitoring  Blood Sugar Ranges: 100  Polyuria: no   Visual problems: no   Medication Compliance: yes  Medication Side Effects  Hypoglycemia: no   Preventitive Health Care  Eye Exam: due   Foot Exam: done today   Diet pattern: eating low carb, still eating fried foods   Exercise: no  3. CHRONIC HYPERTENSION  Disease Monitoring  Blood pressure range: not checking   Chest pain: no   Dyspnea: no   Claudication: no   Medication compliance: no  Medication Side Effects  Lightheadedness: no   Urinary frequency: no   Edema: no   4. Overactive bladder: wetting herself 2-3 times per day. This has been going on for past 6 weeks. Previously she had urgency.. No she has incontinence without urgency. No dysuria.   5. Hot flashes: she tried Effexor. Noticed hair thinning in front of scalp. Stopped Effexor. Reports hair grew back. Still with significant with hot flashes. She has CAD. She reports that previous treatment with vaginal estrogen was helpful.     Social History  Substance Use Topics  . Smoking status: Former Smoker    Packs/day: 0.12    Years: 38.00    Types: Cigarettes    Quit date: 02/08/2013  . Smokeless tobacco: Never Used  . Alcohol use 0.0 oz/week     Comment: 04/17/2015 "I'll have a glass of wine on birthdays, new year's, etc."    Outpatient Medications Prior to Visit  Medication Sig Dispense Refill  . albuterol (PROVENTIL HFA;VENTOLIN HFA) 108 (90 BASE) MCG/ACT inhaler Inhale 2 puffs into the lungs every 6 (six) hours as needed for wheezing. 3 Inhaler 2  . amLODipine (NORVASC) 5 MG tablet Take 1 tablet (5 mg total) by mouth daily. 90 tablet 3  . aspirin EC 81 MG tablet Take 1 tablet (81 mg total) by mouth  daily.    Marland Kitchen atorvastatin (LIPITOR) 80 MG tablet Take 1 tablet (80 mg total) by mouth at bedtime. 30 tablet 11  . buPROPion (WELLBUTRIN XL) 150 MG 24 hr tablet Take 1 tablet (150 mg total) by mouth every evening. 30 tablet 0  . Calcium Carbonate-Vitamin D 600-400 MG-UNIT per chew tablet Chew 1 tablet by mouth daily. Reported on 03/25/2015    . clopidogrel (PLAVIX) 75 MG tablet Take 1 tablet (75 mg total) by mouth daily. Take 4 tablets the first day then 1 tablet daily thereafter 94 tablet 3  . clotrimazole (LOTRIMIN) 1 % cream Apply 1 application topically 2 (two) times daily as needed. Reported on 07/09/2015 60 g 0  . diazepam (VALIUM) 5 MG tablet Take 5 mg by mouth 2 (two) times daily as needed for anxiety. Reported on 07/09/2015    . famotidine (PEPCID) 20 MG tablet One at bedtime 30 tablet 2  . fluticasone (FLONASE) 50 MCG/ACT nasal spray Place 2 sprays into both nostrils daily. 16 g 3  . hydrochlorothiazide (HYDRODIURIL) 25 MG tablet Take 1 tablet (25 mg total) by mouth daily. 30 tablet 11  . hydrOXYzine (ATARAX/VISTARIL) 25 MG tablet Take 1 tablet (25 mg total) by mouth at bedtime. 30 tablet 2  . levothyroxine (SYNTHROID, LEVOTHROID) 125 MCG tablet Take 1 tablet (125 mcg total) by mouth daily. 90 tablet 3  .  lidocaine (LIDODERM) 5 % Place 1 patch onto the skin daily as needed (pain). Remove & Discard patch within 12 hours or as directed by MD    . metFORMIN (GLUCOPHAGE XR) 500 MG 24 hr tablet Take 1 tablet (500 mg total) by mouth daily with breakfast. 30 tablet 5  . NITROSTAT 0.4 MG SL tablet PLACE 1 TABLET UNDER THE TONGUE EVERY 5 MINS AS NEEDED FOR CHEST PAIN 25 tablet 0  . OLANZapine (ZYPREXA) 20 MG tablet Take 20 mg by mouth at bedtime.    . pantoprazole (PROTONIX) 40 MG tablet Take 1 tablet by mouth twice daily 60 tablet 2  . polyethylene glycol powder (MIRALAX) powder Take 17 g by mouth daily. Constipation 850 g 0  . potassium chloride (K-DUR) 10 MEQ tablet Take 1 tablet (10 mEq total) by  mouth daily. 30 tablet 11  . QUEtiapine (SEROQUEL) 400 MG tablet Take 600 mg by mouth at bedtime.    Marland Kitchen QUEtiapine (SEROQUEL) 50 MG tablet Take 50 mg by mouth 2 (two) times daily.    Marland Kitchen venlafaxine XR (EFFEXOR XR) 37.5 MG 24 hr capsule Take 1 capsule (37.5 mg total) by mouth daily with breakfast. 30 capsule 2   No facility-administered medications prior to visit.     ROS Review of Systems  Constitutional: Negative for chills and fever.  Eyes: Negative for visual disturbance.  Respiratory: Positive for shortness of breath. Negative for cough.   Cardiovascular: Negative for chest pain.  Gastrointestinal: Positive for abdominal distention, abdominal pain and constipation. Negative for blood in stool, diarrhea, nausea and vomiting.  Endocrine: Positive for heat intolerance (hot flashes ).  Genitourinary: Positive for frequency.       Urinary incontinence   Musculoskeletal: Negative for arthralgias and back pain.  Skin: Negative for rash.  Allergic/Immunologic: Negative for immunocompromised state.  Hematological: Negative for adenopathy. Does not bruise/bleed easily.  Psychiatric/Behavioral: Positive for dysphoric mood. Negative for suicidal ideas.    Objective:  BP 132/87 (BP Location: Left Arm, Patient Position: Sitting, Cuff Size: Small)   Pulse 66   Temp 98.3 F (36.8 C) (Oral)   Ht 5\' 2"  (1.575 m)   Wt 169 lb 3.2 oz (76.7 kg)   SpO2 100%   BMI 30.95 kg/m   BP/Weight 02/22/2016 12/25/2015 0000000  Systolic BP [id number]  Diastolic BP 87 80 80  Wt. (Lbs) 169.2 167.2 165.4  BMI 30.95 30.58 30.25    Physical Exam  Constitutional: She is oriented to person, place, and time. She appears well-developed and well-nourished. No distress.  obese  HENT:  Head: Normocephalic and atraumatic.  Cardiovascular: Normal rate, regular rhythm, normal heart sounds and intact distal pulses.   Pulmonary/Chest: Effort normal and breath sounds normal.  Musculoskeletal: She exhibits no  edema.  Neurological: She is alert and oriented to person, place, and time.  Skin: Skin is warm and dry. No rash noted.  Psychiatric: She has a normal mood and affect.   Lab Results  Component Value Date   HGBA1C 6.3 10/23/2015   CBG 114   Lab Results  Component Value Date   TSH 5.39 (H) 10/23/2015   Depression screen PHQ 2/9 01/21/2016 12/01/2015 10/23/2015  Decreased Interest 3 3 2   Down, Depressed, Hopeless 3 3 2   PHQ - 2 Score 6 6 4   Altered sleeping 3 3 3   Tired, decreased energy 1 3 3   Change in appetite 3 3 3   Feeling bad or failure about yourself  0 1 0  Trouble  concentrating 1 3 3   Moving slowly or fidgety/restless 0 1 0  Suicidal thoughts 0 0 0  PHQ-9 Score 14 20 16   Difficult doing work/chores - - -   GAD 7 : Generalized Anxiety Score 12/01/2015 10/23/2015 07/09/2015  Nervous, Anxious, on Edge 3 3 3   Control/stop worrying 3 3 3   Worry too much - different things 3 3 3   Trouble relaxing 3 3 3   Restless 2 2 3   Easily annoyed or irritable 3 3 3   Afraid - awful might happen 0 0 0  Total GAD 7 Score 17 17 18   Anxiety Difficulty - - Extremely difficult     Assessment & Plan:   Dashia was seen today for diabetes, hypertension and hypothyroidism.  Diagnoses and all orders for this visit:  Diabetes mellitus due to underlying condition without complication, without long-term current use of insulin (HCC) -     POCT glucose (manual entry) -     Microalbumin/Creatinine Ratio, Urine -     Ambulatory referral to Ophthalmology  Other specified hypothyroidism -     TSH  Overactive bladder -     Urinalysis Dipstick -     solifenacin (VESICARE) 5 MG tablet; Take 1 tablet (5 mg total) by mouth daily.  Hot flashes -     PARoxetine (PAXIL) 10 MG tablet; Take 1 tablet (10 mg total) by mouth daily.     No orders of the defined types were placed in this encounter.   Follow-up: Return in about 4 weeks (around 03/21/2016) for overactive bladder and hot flashes .    Boykin Nearing MD

## 2016-02-22 NOTE — Assessment & Plan Note (Addendum)
Urinary incontinence with 2-3 episode of incontinence a day  Continue Kegel nad miralax Add vesciare  Consider stopping HCTZ

## 2016-02-22 NOTE — Assessment & Plan Note (Signed)
Intolerant of effexor Add paxil  Avoid estrogen due to CAD

## 2016-02-22 NOTE — Patient Instructions (Addendum)
Lindsay Rivera was seen today for diabetes, hypertension and hypothyroidism.  Diagnoses and all orders for this visit:  Diabetes mellitus due to underlying condition without complication, without long-term current use of insulin (HCC) -     POCT glucose (manual entry) -     Microalbumin/Creatinine Ratio, Urine -     Ambulatory referral to Ophthalmology  Other specified hypothyroidism -     TSH  Overactive bladder -     Urinalysis Dipstick -     solifenacin (VESICARE) 5 MG tablet; Take 1 tablet (5 mg total) by mouth daily.  Hot flashes -     PARoxetine (PAXIL) 10 MG tablet; Take 1 tablet (10 mg total) by mouth daily.    Replace effexor with paxil 10 mg daily  There is no alopecia risk with paxil   Start low dose vesicare for overactive bladder. Be mindful that constipation may worsen.   We cannot use estrogen due to your history of MI.    F/u in 4 weeks for hot flashes and over active bladder Dr. Adrian Blackwater

## 2016-02-23 LAB — MICROALBUMIN / CREATININE URINE RATIO
Creatinine, Urine: 110 mg/dL (ref 20–320)
MICROALB UR: 0.2 mg/dL
MICROALB/CREAT RATIO: 2 ug/mg{creat} (ref <30)

## 2016-02-26 ENCOUNTER — Emergency Department (HOSPITAL_COMMUNITY): Payer: Self-pay

## 2016-02-26 ENCOUNTER — Emergency Department (HOSPITAL_COMMUNITY)
Admission: EM | Admit: 2016-02-26 | Discharge: 2016-02-26 | Disposition: A | Discharge status: Home / Self Care | Payer: Self-pay | Attending: Emergency Medicine | Admitting: Emergency Medicine

## 2016-02-26 DIAGNOSIS — Z8673 Personal history of transient ischemic attack (TIA), and cerebral infarction without residual deficits: Secondary | ICD-10-CM | POA: Insufficient documentation

## 2016-02-26 DIAGNOSIS — Y9301 Activity, walking, marching and hiking: Secondary | ICD-10-CM | POA: Insufficient documentation

## 2016-02-26 DIAGNOSIS — Z87891 Personal history of nicotine dependence: Secondary | ICD-10-CM | POA: Insufficient documentation

## 2016-02-26 DIAGNOSIS — I110 Hypertensive heart disease with heart failure: Secondary | ICD-10-CM | POA: Insufficient documentation

## 2016-02-26 DIAGNOSIS — I2510 Atherosclerotic heart disease of native coronary artery without angina pectoris: Secondary | ICD-10-CM | POA: Insufficient documentation

## 2016-02-26 DIAGNOSIS — Y929 Unspecified place or not applicable: Secondary | ICD-10-CM | POA: Insufficient documentation

## 2016-02-26 DIAGNOSIS — M545 Low back pain: Secondary | ICD-10-CM | POA: Insufficient documentation

## 2016-02-26 DIAGNOSIS — M7918 Myalgia, other site: Secondary | ICD-10-CM

## 2016-02-26 DIAGNOSIS — I509 Heart failure, unspecified: Secondary | ICD-10-CM | POA: Insufficient documentation

## 2016-02-26 DIAGNOSIS — Z7982 Long term (current) use of aspirin: Secondary | ICD-10-CM | POA: Insufficient documentation

## 2016-02-26 DIAGNOSIS — E119 Type 2 diabetes mellitus without complications: Secondary | ICD-10-CM | POA: Insufficient documentation

## 2016-02-26 DIAGNOSIS — Z951 Presence of aortocoronary bypass graft: Secondary | ICD-10-CM | POA: Insufficient documentation

## 2016-02-26 DIAGNOSIS — I252 Old myocardial infarction: Secondary | ICD-10-CM | POA: Insufficient documentation

## 2016-02-26 DIAGNOSIS — Z91040 Latex allergy status: Secondary | ICD-10-CM | POA: Insufficient documentation

## 2016-02-26 DIAGNOSIS — W19XXXA Unspecified fall, initial encounter: Secondary | ICD-10-CM

## 2016-02-26 DIAGNOSIS — Z955 Presence of coronary angioplasty implant and graft: Secondary | ICD-10-CM | POA: Insufficient documentation

## 2016-02-26 DIAGNOSIS — Y999 Unspecified external cause status: Secondary | ICD-10-CM | POA: Insufficient documentation

## 2016-02-26 DIAGNOSIS — E039 Hypothyroidism, unspecified: Secondary | ICD-10-CM | POA: Insufficient documentation

## 2016-02-26 DIAGNOSIS — Z7984 Long term (current) use of oral hypoglycemic drugs: Secondary | ICD-10-CM | POA: Insufficient documentation

## 2016-02-26 DIAGNOSIS — J45909 Unspecified asthma, uncomplicated: Secondary | ICD-10-CM | POA: Insufficient documentation

## 2016-02-26 DIAGNOSIS — W108XXA Fall (on) (from) other stairs and steps, initial encounter: Secondary | ICD-10-CM | POA: Insufficient documentation

## 2016-02-26 MED ORDER — ONDANSETRON 4 MG PO TBDP
4.0000 mg | ORAL_TABLET | Freq: Once | ORAL | Status: AC
  Administered 2016-02-26: 4 mg via ORAL
  Filled 2016-02-26: qty 1

## 2016-02-26 MED ORDER — MORPHINE SULFATE (PF) 4 MG/ML IV SOLN
4.0000 mg | Freq: Once | INTRAVENOUS | Status: AC
  Administered 2016-02-26: 4 mg via INTRAMUSCULAR
  Filled 2016-02-26: qty 1

## 2016-02-26 MED ORDER — METHOCARBAMOL 500 MG PO TABS: ORAL_TABLET | ORAL | 0 refills | Status: DC

## 2016-02-26 NOTE — ED Notes (Signed)
Patient d/c'd self care.  F/U and medication reviewed.  Patient verbalized understanding. 

## 2016-02-26 NOTE — ED Notes (Signed)
Bed: [facility] Expected date:  Expected time:  Means of arrival:  Comments: 
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
## 2016-02-26 NOTE — ED Triage Notes (Signed)
Pt from home with complaints of back pain that started after she was walking out of her home and slipped on ice and fell. Pt stated that her family came out and got her up and got her in the car. Pt rates pain 10/10 and in tears upon assessment. Pt reports having a cardiac history.

## 2016-02-26 NOTE — ED Notes (Signed)
Per Chong Sicilian, RN- c-collar placed on patient

## 2016-02-26 NOTE — Discharge Instructions (Signed)
You can also take  tylenol (acetaminophen) 975mg (this is 3 over the counter pills) four times a day. Do not drink alcohol or combine with other medications that have acetaminophen as an ingredient (Read the labels!).   ° °For breakthrough pain you may take Robaxin. Do not drink alcohol, drive or operate heavy machinery when taking Robaxin. ° °Please follow with your primary care doctor in the next 2 days for a check-up. They must obtain records for further management.  ° °Do not hesitate to return to the Emergency Department for any new, worsening or concerning symptoms.  ° ° °

## 2016-02-26 NOTE — ED Notes (Signed)
Patient was able to ambulate to from bed to wheelchair.  Patient was stiff and sore but was able to be on her feet.

## 2016-03-02 ENCOUNTER — Telehealth: Payer: Self-pay

## 2016-03-02 NOTE — Telephone Encounter (Signed)
Pt was called and informed of lab results. 

## 2016-03-15 ENCOUNTER — Other Ambulatory Visit: Payer: Self-pay | Admitting: Family Medicine

## 2016-03-15 DIAGNOSIS — L299 Pruritus, unspecified: Secondary | ICD-10-CM

## 2016-03-15 DIAGNOSIS — R232 Flushing: Secondary | ICD-10-CM

## 2016-03-15 DIAGNOSIS — F3130 Bipolar disorder, current episode depressed, mild or moderate severity, unspecified: Secondary | ICD-10-CM

## 2016-03-15 MED FILL — OLANZapine 20 MG TABS: 20 | 30 days supply | Qty: 30 | Fill #0

## 2016-03-15 MED FILL — ?AMLODIPINE BESYLATE 5 MG T: 5 | 30 days supply | Qty: 30 | Fill #9

## 2016-03-15 MED FILL — HYDROXYZINE PAM 50 MG CAP: 50 | 30 days supply | Qty: 60 | Fill #1

## 2016-03-15 MED FILL — FLUoxetine HCL 20 MG CAPS: 20 | 30 days supply | Qty: 90 | Fill #2

## 2016-03-15 MED FILL — HYDROCHLOROTHIAZIDE 25 MG T: 25 | 30 days supply | Qty: 30 | Fill #6

## 2016-03-15 MED FILL — PANTOPRAZOLE SOD DR 40 MG T: 40 | 30 days supply | Qty: 60 | Fill #2

## 2016-03-15 MED FILL — ?LEVOTHYROXINE 125 MCG TABL: 125 | 30 days supply | Qty: 30 | Fill #4

## 2016-03-15 MED FILL — ATORVASTATIN 80 MG TABLET: 80 | 30 days supply | Qty: 30 | Fill #9

## 2016-03-30 MED FILL — PROPRANOLOL 20 MG TABLET: 20 | 30 days supply | Qty: 60 | Fill #0

## 2016-03-30 MED FILL — BUPROPION HCL SR 100 MG TAB: 100 | 30 days supply | Qty: 15 | Fill #0

## 2016-04-19 ENCOUNTER — Other Ambulatory Visit: Payer: Self-pay | Admitting: Internal Medicine

## 2016-04-19 ENCOUNTER — Other Ambulatory Visit: Payer: Self-pay | Admitting: Family Medicine

## 2016-04-19 ENCOUNTER — Other Ambulatory Visit: Payer: Self-pay | Admitting: Cardiovascular Disease

## 2016-04-19 DIAGNOSIS — R232 Flushing: Secondary | ICD-10-CM

## 2016-04-19 DIAGNOSIS — F3130 Bipolar disorder, current episode depressed, mild or moderate severity, unspecified: Secondary | ICD-10-CM

## 2016-04-19 MED FILL — CLOPIDOGREL 75 MG TABLET: 75 | 90 days supply | Qty: 94 | Fill #3

## 2016-04-19 MED FILL — BUPROPION SR 150 MG TABLET: 150 | 30 days supply | Qty: 60 | Fill #2

## 2016-04-19 MED FILL — FAMOTIDINE 20 MG TABLET: 20 | 30 days supply | Qty: 30 | Fill #2

## 2016-04-19 MED FILL — LEVOTHYROXINE 125 MCG TAB: 125 | 30 days supply | Qty: 30 | Fill #5

## 2016-04-19 MED FILL — HYDROCHLOROTHIAZIDE 25 MG T: 25 | 30 days supply | Qty: 30 | Fill #7

## 2016-04-19 MED FILL — ?BUPROPION HCL SR 100 MG TA: 100 | 30 days supply | Qty: 15 | Fill #1

## 2016-04-19 MED FILL — ?HYDROXYZINE HCL 25 MG TAB: 25 MG | 30 days supply | Qty: 30 | Fill #0

## 2016-04-19 MED FILL — ?AMLODIPINE BESYLATE 5 MG T: 5 | 30 days supply | Qty: 30 | Fill #10

## 2016-04-19 MED FILL — PROPRANOLOL 20 MG TABLET: 20 | 30 days supply | Qty: 60 | Fill #1

## 2016-04-20 ENCOUNTER — Other Ambulatory Visit: Payer: Self-pay | Admitting: Internal Medicine

## 2016-04-20 ENCOUNTER — Other Ambulatory Visit: Payer: Self-pay | Admitting: Family Medicine

## 2016-04-20 MED FILL — VENTOLIN HFA 90 MCG INHALER: 108 (90 BAS | 25 days supply | Qty: 18 | Fill #0

## 2016-04-20 MED FILL — ATORVASTATIN 80 MG TABLET: 80 | 30 days supply | Qty: 30 | Fill #0

## 2016-04-20 MED FILL — OLANZapine 15 MG TABS: 15 | 30 days supply | Qty: 60 | Fill #0

## 2016-04-22 ENCOUNTER — Other Ambulatory Visit: Payer: Self-pay | Admitting: Family Medicine

## 2016-04-22 MED ORDER — PANTOPRAZOLE SODIUM 40 MG PO TBEC: 40.0000 mg | DELAYED_RELEASE_TABLET | Freq: Two times a day (BID) | ORAL | 5 refills | Status: DC

## 2016-04-22 MED FILL — ?PANTOPRAZOLE SOD DR 40MG: 40 MG | 30 days supply | Qty: 60 | Fill #0

## 2016-05-04 ENCOUNTER — Other Ambulatory Visit: Payer: Self-pay | Admitting: Family Medicine

## 2016-05-04 DIAGNOSIS — Z1231 Encounter for screening mammogram for malignant neoplasm of breast: Secondary | ICD-10-CM

## 2016-05-24 ENCOUNTER — Other Ambulatory Visit: Payer: Self-pay | Admitting: Internal Medicine

## 2016-05-24 MED FILL — LEVOTHYROXINE 125 MCG TAB: 125 | 30 days supply | Qty: 30 | Fill #6

## 2016-05-24 MED FILL — PROPRANOLOL 20 MG TABLET: 20 | 30 days supply | Qty: 60 | Fill #2

## 2016-05-24 MED FILL — ATORVASTATIN 80 MG TABLET: 80 | 30 days supply | Qty: 30 | Fill #1

## 2016-05-24 MED FILL — PANTOPRAZOLE SOD DR 40 MG T: 40 | 30 days supply | Qty: 60 | Fill #1

## 2016-05-24 MED FILL — HYDROXYZINE PAM 50 MG CAP: 50 | 30 days supply | Qty: 60 | Fill #2

## 2016-05-24 MED FILL — HYDROCHLOROTHIAZIDE 25 MG T: 25 | 30 days supply | Qty: 30 | Fill #8

## 2016-05-24 MED FILL — OLANZapine 15 MG TABS: 15 | 30 days supply | Qty: 60 | Fill #1

## 2016-06-06 ENCOUNTER — Ambulatory Visit: Payer: No Typology Code available for payment source

## 2016-06-09 ENCOUNTER — Ambulatory Visit: Payer: Self-pay | Attending: Family Medicine

## 2016-06-09 MED FILL — PROPRANOLOL 20 MG TABLET: 20 | 30 days supply | Qty: 60 | Fill #0

## 2016-06-09 MED FILL — BUPROPION HCL SR 100 MG TAB: 100 | 30 days supply | Qty: 15 | Fill #0

## 2016-06-09 MED FILL — FLUoxetine HCL 20 MG CAPS: 20 | 30 days supply | Qty: 90 | Fill #0

## 2016-06-09 MED FILL — BUPROPION SR 150 MG TABLET: 150 | 30 days supply | Qty: 60 | Fill #0

## 2016-06-16 ENCOUNTER — Telehealth: Payer: Self-pay | Admitting: Family Medicine

## 2016-06-16 NOTE — Telephone Encounter (Signed)
Patient need OV to check spots

## 2016-06-16 NOTE — Telephone Encounter (Signed)
Pt left 2nd message,  Left message on voicemail to call back.

## 2016-06-16 NOTE — Telephone Encounter (Signed)
Will route to PCP 

## 2016-06-16 NOTE — Telephone Encounter (Signed)
Patient called the office asking to speak with with PCP or nurse regarding the black itchy spots that keeps spreading through her body. Pt is very concerned. Please follow up.  Thank you.

## 2016-06-17 ENCOUNTER — Encounter: Payer: Self-pay | Admitting: Physician Assistant

## 2016-06-17 ENCOUNTER — Ambulatory Visit: Payer: Self-pay | Attending: Family Medicine | Admitting: Physician Assistant

## 2016-06-17 VITALS — BP 134/92 | HR 58 | Temp 98.0°F | Resp 16 | Wt 169.0 lb

## 2016-06-17 DIAGNOSIS — E039 Hypothyroidism, unspecified: Secondary | ICD-10-CM

## 2016-06-17 DIAGNOSIS — R21 Rash and other nonspecific skin eruption: Secondary | ICD-10-CM

## 2016-06-17 DIAGNOSIS — L42 Pityriasis rosea: Secondary | ICD-10-CM

## 2016-06-17 DIAGNOSIS — K219 Gastro-esophageal reflux disease without esophagitis: Secondary | ICD-10-CM | POA: Insufficient documentation

## 2016-06-17 DIAGNOSIS — H539 Unspecified visual disturbance: Secondary | ICD-10-CM

## 2016-06-17 DIAGNOSIS — Z7984 Long term (current) use of oral hypoglycemic drugs: Secondary | ICD-10-CM | POA: Insufficient documentation

## 2016-06-17 DIAGNOSIS — E785 Hyperlipidemia, unspecified: Secondary | ICD-10-CM | POA: Insufficient documentation

## 2016-06-17 DIAGNOSIS — F319 Bipolar disorder, unspecified: Secondary | ICD-10-CM | POA: Insufficient documentation

## 2016-06-17 DIAGNOSIS — Z7982 Long term (current) use of aspirin: Secondary | ICD-10-CM | POA: Insufficient documentation

## 2016-06-17 DIAGNOSIS — F419 Anxiety disorder, unspecified: Secondary | ICD-10-CM | POA: Insufficient documentation

## 2016-06-17 DIAGNOSIS — Z880 Allergy status to penicillin: Secondary | ICD-10-CM | POA: Insufficient documentation

## 2016-06-17 DIAGNOSIS — Z79899 Other long term (current) drug therapy: Secondary | ICD-10-CM | POA: Insufficient documentation

## 2016-06-17 DIAGNOSIS — Z888 Allergy status to other drugs, medicaments and biological substances status: Secondary | ICD-10-CM | POA: Insufficient documentation

## 2016-06-17 DIAGNOSIS — I252 Old myocardial infarction: Secondary | ICD-10-CM | POA: Insufficient documentation

## 2016-06-17 DIAGNOSIS — I110 Hypertensive heart disease with heart failure: Secondary | ICD-10-CM | POA: Insufficient documentation

## 2016-06-17 DIAGNOSIS — I509 Heart failure, unspecified: Secondary | ICD-10-CM | POA: Insufficient documentation

## 2016-06-17 DIAGNOSIS — E119 Type 2 diabetes mellitus without complications: Secondary | ICD-10-CM | POA: Insufficient documentation

## 2016-06-17 DIAGNOSIS — Z8673 Personal history of transient ischemic attack (TIA), and cerebral infarction without residual deficits: Secondary | ICD-10-CM | POA: Insufficient documentation

## 2016-06-17 DIAGNOSIS — I10 Essential (primary) hypertension: Secondary | ICD-10-CM

## 2016-06-17 DIAGNOSIS — Z114 Encounter for screening for human immunodeficiency virus [HIV]: Secondary | ICD-10-CM

## 2016-06-17 DIAGNOSIS — J45909 Unspecified asthma, uncomplicated: Secondary | ICD-10-CM | POA: Insufficient documentation

## 2016-06-17 MED ORDER — CETIRIZINE HCL 10 MG PO TABS: 10.0000 mg | ORAL_TABLET | Freq: Every day | ORAL | 11 refills | Status: DC

## 2016-06-17 MED FILL — ?CETIRIZINE HCL 10 MG TABLE: 10 | 30 days supply | Qty: 30 | Fill #0

## 2016-06-17 NOTE — Progress Notes (Signed)
Lindsay Rivera, is a 56 y.o. female  RKY:[id number]  BTD:[id number]  DOB - [birth year]  Subjective:  Chief Complaint and HPI: Lindsay Rivera is a 56 y.o. female here today for a rash that is concerning her.  She is scared because her friends have her convinced that she has HIV or Lupus.  She says the rash started about 3 weeks ago in her R axilla.  Initially it was one spot that was itching.  A few days later, she developed multiple more lesions under her R arm, L arm, and upper thighs.  It is itching.  She denies f/c.  No recent tick bites or detergent changes.    She also needs her thyroid testing done.  She forgot to taker her BP med today.  Also wants a referral for opthalmology for eye exam and glasses.  ROS:   Constitutional:  No f/c, No night sweats, No unexplained weight loss. EENT:  + vision changes, No blurry vision, No hearing changes. No mouth, throat, or ear problems.  Respiratory: No cough, No SOB Cardiac: No CP, no palpitations GI:  No abd pain, No N/V/D. GU: No Urinary s/sx Musculoskeletal: No joint pain Neuro: No headache, no dizziness, no motor weakness.  Skin: + rash Endocrine:  No polydipsia. No polyuria.  Psych: Denies SI/HI  No problems updated.  ALLERGIES: Allergies  Allergen Reactions  . Penicillins Swelling    Has patient had a PCN reaction causing immediate rash, facial/tongue/throat swelling, SOB or lightheadedness with hypotension: No Has patient had a PCN reaction causing severe rash involving mucus membranes or skin necrosis: No Has patient had a PCN reaction that required hospitalization No Has patient had a PCN reaction occurring within the last 10 years: No If all of the above answers are "NO", then may proceed with Cephalosporin use.   . Latex Rash    PAST MEDICAL HISTORY: Past Medical History:  Diagnosis Date  . Anemia   . Anxiety   . Arthritis    "lower back" (04/17/2015)  . Asthma   . Bipolar disorder ([place])   . CHF (congestive heart  failure) ([place])   . Chronic lower back pain   . Constipation   . CVA (cerebral vascular accident) ([place]) 2010   denies residual on 04/17/2015  . Depression   . GERD (gastroesophageal reflux disease)   . Hyperlipidemia   . Hypertension   . Hypothyroidism   . MI (myocardial infarction) ([place]) 02/08/2013  . Migraine    "q 3-4 months" (04/17/2015)  . Type II diabetes mellitus ([place])    "diet controlled" (04/17/2015)    MEDICATIONS AT HOME: Prior to Admission medications   Medication Sig Start Date End Date Taking? Authorizing Provider  amLODipine (NORVASC) 5 MG tablet Take 1 tablet (5 mg total) by mouth daily. 04/28/15   Barrett, Evelene Croon, PA-C  aspirin EC 81 MG tablet Take 1 tablet (81 mg total) by mouth daily. 04/15/15   Nahser, Wonda Cheng, MD  atorvastatin (LIPITOR) 80 MG tablet TAKE 1 TABLET BY MOUTH AT BEDTIME 04/20/16   Nahser, Wonda Cheng, MD  buPROPion (WELLBUTRIN XL) 150 MG 24 hr tablet Take 1 tablet (150 mg total) by mouth every evening. 02/13/14   Lorayne Marek, MD  Calcium Carbonate-Vitamin D 600-400 MG-UNIT per chew tablet Chew 1 tablet by mouth daily. Reported on 03/25/2015    [provider]  cetirizine (ZYRTEC) 10 MG tablet Take 1 tablet (10 mg total) by mouth daily. 06/17/16   Argentina Donovan, PA-C  clopidogrel (  PLAVIX) 75 MG tablet Take 1 tablet (75 mg total) by mouth daily. Take 4 tablets the first day then 1 tablet daily thereafter 07/14/15   Nahser, Wonda Cheng, MD  clotrimazole (LOTRIMIN) 1 % cream Apply 1 application topically 2 (two) times daily as needed. Reported on 07/09/2015 07/09/15   Argentina Donovan, PA-C  diazepam (VALIUM) 5 MG tablet Take 5 mg by mouth 2 (two) times daily as needed for anxiety. Reported on 07/09/2015    [provider]  famotidine (PEPCID) 20 MG tablet One at bedtime Patient taking differently: Take 20 mg by mouth daily. One at bedtime 12/25/15   Tanda Rockers, MD  fluticasone [facility]) 50 MCG/ACT nasal spray Place 2 sprays into both nostrils daily.  07/09/15   Argentina Donovan, PA-C  hydrochlorothiazide (HYDRODIURIL) 25 MG tablet Take 1 tablet (25 mg total) by mouth daily. 08/03/15   Nahser, Wonda Cheng, MD  hydrOXYzine (ATARAX/VISTARIL) 25 MG tablet TAKE 1 TABLET BY MOUTH AT BEDTIME. 03/15/16   Funches, Adriana Mccallum, MD  levothyroxine (SYNTHROID, LEVOTHROID) 125 MCG tablet Take 1 tablet (125 mcg total) by mouth daily. 10/25/15   Funches, Adriana Mccallum, MD  lidocaine (LIDODERM) 5 % Place 1 patch onto the skin daily as needed (pain). Remove & Discard patch within 12 hours or as directed by MD    [provider]  metFORMIN (GLUCOPHAGE XR) 500 MG 24 hr tablet Take 1 tablet (500 mg total) by mouth daily with breakfast. 10/23/15   Boykin Nearing, MD  methocarbamol (ROBAXIN) 500 MG tablet Can take up to 1-2 tabs every 6 hours PRN PAIN 02/26/16   Pisciotta, Elmyra Ricks, PA-C  NITROSTAT 0.4 MG SL tablet PLACE 1 TABLET UNDER THE TONGUE EVERY 5 MINS AS NEEDED FOR CHEST PAIN 04/16/15   Tresa Garter, MD  OLANZapine (ZYPREXA) 20 MG tablet Take 20 mg by mouth at bedtime.    [provider]  pantoprazole (PROTONIX) 40 MG tablet Take 1 tablet (40 mg total) by mouth 2 (two) times daily. 04/22/16   Funches, Adriana Mccallum, MD  PARoxetine (PAXIL) 10 MG tablet Take 1 tablet (10 mg total) by mouth daily. 02/22/16   Funches, Adriana Mccallum, MD  polyethylene glycol powder (MIRALAX) powder Take 17 g by mouth daily. Constipation 12/01/15   Funches, Adriana Mccallum, MD  potassium chloride (K-DUR) 10 MEQ tablet Take 1 tablet (10 mEq total) by mouth daily. 08/03/15   Nahser, Wonda Cheng, MD  QUEtiapine (SEROQUEL) 400 MG tablet Take 600 mg by mouth at bedtime.    [provider]  QUEtiapine (SEROQUEL) 50 MG tablet Take 50 mg by mouth 2 (two) times daily.    [provider]  solifenacin (VESICARE) 5 MG tablet Take 1 tablet (5 mg total) by mouth daily. 02/22/16   Funches, Adriana Mccallum, MD  VENTOLIN HFA 108 (90 Base) MCG/ACT inhaler INHALE 2 PUFFS INTO THE LUNGS EVERY 6 HOURS AS NEEDED FOR  WHEEZING. 04/20/16   Funches, Adriana Mccallum, MD     Objective:  EXAM:   Vitals:   06/17/16 1003  BP: (!) 134/92  Pulse: (!) 58  Resp: 16  Temp: 98 F (36.7 C)  TempSrc: Oral  SpO2: 98%  Weight: 169 lb (76.7 kg)    General appearance : A&OX3. NAD. Non-toxic-appearing, crying at encounter onset but calms as we continue to talk- HEENT: Atraumatic and Normocephalic.  PERRLA. EOM intact.   Neck: supple, no JVD. No cervical lymphadenopathy. No thyromegaly Chest/Lungs:  Breathing-non-labored, Good air entry bilaterally, breath sounds normal without rales, rhonchi, or wheezing  CVS: S1 S2 regular, no murmurs, gallops, rubs  Extremities: Bilateral Lower Ext shows no edema, both legs are warm to touch with = pulse throughout Neurology:  CN II-XII grossly intact, Non focal.   Psych:  TP linear. J/I WNL. Normal speech. Appropriate eye contact and affect.  Skin:  + Rash.  Classic "herald patch" under R axilla with multiple other hyperpigmented oval patches present.  l axilla also affected(no LN).  Upper thighs also affected.  No secondary infection.  Typical Christmas tree pattern.  Data Review Lab Results  Component Value Date   HGBA1C 6.3 10/23/2015   HGBA1C 6.1 07/09/2015   HGBA1C 6.30 01/20/2015     Assessment & Plan   1. Pityriasis rosea - cetirizine (ZYRTEC) 10 MG tablet; Take 1 tablet (10 mg total) by mouth daily.  Dispense: 30 tablet; Refill: 11 Handout given  2. Rash Due to #1 - cetirizine (ZYRTEC) 10 MG tablet; Take 1 tablet (10 mg total) by mouth daily.  Dispense: 30 tablet; Refill: 11 - ANA  3. Encounter for screening for HIV - HIV antibody  4. Hypothyroidism, unspecified type - TSH  5. Vision changes - Ambulatory referral to Ophthalmology  6. Essential hypertension Check BP OOO and record.  Most likely it is up bc she is scared and was upset and crying and didn't take meds this morning.  Continue current regimen.     Patient have been counseled extensively  about nutrition and exercise  Return in about 2 months (around 08/17/2016) for Dr Adrian Blackwater; f/up hypothyroid and htn.  The patient was given clear instructions to go to ER or return to medical center if symptoms don't improve, worsen or new problems develop. The patient verbalized understanding. The patient was told to call to get lab results if they haven't heard anything in the next week.     Freeman Caldron, PA-C [facility] and [place], [place]   06/17/2016, 10:23 AMPatient ID: [name], female   DOB: [birth year], 56 y.o.   MRN: [id number]

## 2016-06-17 NOTE — Patient Instructions (Signed)
Pityriasis Rosea Pityriasis rosea is a rash that usually appears on the trunk of the body. It may also appear on the upper arms and upper legs. It usually begins as a single patch, and then more patches begin to develop. The rash may cause mild itching, but it normally does not cause other problems. It usually goes away without treatment. However, it may take weeks or months for the rash to go away completely. What are the causes? The cause of this condition is not known. The condition does not spread from person to person (is noncontagious). What increases the risk? This condition is more likely to develop in young adults and children. It is most common in the spring and fall. What are the signs or symptoms? The main symptom of this condition is a rash.  The rash usually begins with a single oval patch that is larger than the ones that follow. This is called a herald patch. It generally appears a week or more before the rest of the rash appears.  When more patches start to develop, they spread quickly on the trunk, back, and arms. These patches are smaller than the first one.  The patches that make up the rash are usually oval-shaped and pink or red in color. They are usually flat, but they may sometimes be raised so that they can be felt with a finger. They may also be finely crinkled and have a scaly ring around the edge.  The rash does not typically appear on areas of the skin that are exposed to the sun. Most people who have this condition do not have other symptoms, but some have mild itching. In a few cases, a mild headache or body aches may occur before the rash appears and then go away. How is this diagnosed? Your health care provider may diagnose this condition by doing a physical exam and taking your medical history. To rule out other possible causes for the rash, the health care provider may order blood tests or take a skin sample from the rash to be looked at under a microscope. How  is this treated? Usually, treatment is not needed for this condition. The rash will probably go away on its own in 4-8 weeks. In some cases, a health care provider may recommend or prescribe medicine to reduce itching. Follow these instructions at home:  Take medicines only as directed by your health care provider.  Avoid scratching the affected areas of skin.  Do not take hot baths or use a sauna. Use only warm water when bathing or showering. Heat can increase itching. Contact a health care provider if:  Your rash does not go away in 8 weeks.  Your rash gets much worse.  You have a fever.  You have swelling or pain in the rash area.  You have fluid, blood, or pus coming from the rash area. This information is not intended to replace advice given to you by your health care provider. Make sure you discuss any questions you have with your health care provider. Document Released: 03/02/2001 Document Revised: 07/02/2015 Document Reviewed: 01/01/2014 Elsevier Interactive Patient Education  2017 [employer]vier Inc.  

## 2016-06-18 LAB — TSH: TSH: 2.36 u[IU]/mL (ref 0.450–4.500)

## 2016-06-18 LAB — HIV ANTIBODY (ROUTINE TESTING W REFLEX): HIV Screen 4th Generation wRfx: NONREACTIVE

## 2016-06-18 LAB — ANA: ANA: NEGATIVE

## 2016-06-21 ENCOUNTER — Telehealth: Payer: Self-pay

## 2016-06-21 NOTE — Telephone Encounter (Signed)
Contacted pt to go over lab results pt didn't answer lvm asking pt to give me a call at her earliest convienence    If pt calls back please give results: HIV and lupus testing were negative.

## 2016-06-21 NOTE — Telephone Encounter (Signed)
OV 06/17/16 on walkin schudule with A.McClung, PA

## 2016-06-22 ENCOUNTER — Encounter: Payer: Self-pay | Admitting: Family Medicine

## 2016-07-07 MED FILL — HYDROCHLOROTHIAZIDE 25 MG T: 25 | 30 days supply | Qty: 30 | Fill #9

## 2016-07-07 MED FILL — BUPROPION SR 150 MG TABLET: 150 | 30 days supply | Qty: 60 | Fill #1

## 2016-07-07 MED FILL — BUPROPION HCL SR 100 MG TAB: 100 | 30 days supply | Qty: 15 | Fill #1

## 2016-07-07 MED FILL — OLANZapine 15 MG TABS: 15 | 30 days supply | Qty: 60 | Fill #2

## 2016-07-07 MED FILL — FLUoxetine HCL 20 MG CAPS: 20 | 30 days supply | Qty: 90 | Fill #1

## 2016-07-07 MED FILL — LEVOTHYROXINE 125 MCG TAB: 125 | 30 days supply | Qty: 30 | Fill #7

## 2016-07-07 MED FILL — ?PANTOPRAZOLE SOD DR 40MG: 40 MG | 30 days supply | Qty: 60 | Fill #2

## 2016-07-07 MED FILL — PROPRANOLOL 20 MG TABLET: 20 | 30 days supply | Qty: 60 | Fill #1

## 2016-08-05 ENCOUNTER — Ambulatory Visit: Payer: Self-pay | Attending: Family Medicine | Admitting: Family Medicine

## 2016-08-05 ENCOUNTER — Other Ambulatory Visit: Payer: Self-pay | Admitting: Cardiovascular Disease

## 2016-08-05 ENCOUNTER — Encounter: Payer: Self-pay | Admitting: Family Medicine

## 2016-08-05 VITALS — BP 130/77 | HR 77 | Temp 98.1°F | Wt 169.0 lb

## 2016-08-05 DIAGNOSIS — R21 Rash and other nonspecific skin eruption: Secondary | ICD-10-CM | POA: Insufficient documentation

## 2016-08-05 DIAGNOSIS — E038 Other specified hypothyroidism: Secondary | ICD-10-CM | POA: Insufficient documentation

## 2016-08-05 DIAGNOSIS — Z87891 Personal history of nicotine dependence: Secondary | ICD-10-CM | POA: Insufficient documentation

## 2016-08-05 DIAGNOSIS — Z951 Presence of aortocoronary bypass graft: Secondary | ICD-10-CM | POA: Insufficient documentation

## 2016-08-05 DIAGNOSIS — E089 Diabetes mellitus due to underlying condition without complications: Secondary | ICD-10-CM

## 2016-08-05 DIAGNOSIS — Z7982 Long term (current) use of aspirin: Secondary | ICD-10-CM | POA: Insufficient documentation

## 2016-08-05 DIAGNOSIS — B009 Herpesviral infection, unspecified: Secondary | ICD-10-CM | POA: Insufficient documentation

## 2016-08-05 DIAGNOSIS — I10 Essential (primary) hypertension: Secondary | ICD-10-CM | POA: Insufficient documentation

## 2016-08-05 DIAGNOSIS — T148XXA Other injury of unspecified body region, initial encounter: Secondary | ICD-10-CM | POA: Insufficient documentation

## 2016-08-05 DIAGNOSIS — Z79899 Other long term (current) drug therapy: Secondary | ICD-10-CM | POA: Insufficient documentation

## 2016-08-05 DIAGNOSIS — F319 Bipolar disorder, unspecified: Secondary | ICD-10-CM | POA: Insufficient documentation

## 2016-08-05 DIAGNOSIS — E119 Type 2 diabetes mellitus without complications: Secondary | ICD-10-CM | POA: Insufficient documentation

## 2016-08-05 DIAGNOSIS — I2510 Atherosclerotic heart disease of native coronary artery without angina pectoris: Secondary | ICD-10-CM | POA: Insufficient documentation

## 2016-08-05 DIAGNOSIS — Z7984 Long term (current) use of oral hypoglycemic drugs: Secondary | ICD-10-CM | POA: Insufficient documentation

## 2016-08-05 LAB — GLUCOSE, POCT (MANUAL RESULT ENTRY): POC Glucose: 135 mg/dl — AB (ref 70–99)

## 2016-08-05 LAB — POCT GLYCOSYLATED HEMOGLOBIN (HGB A1C): Hemoglobin A1C: 6.4

## 2016-08-05 MED ORDER — KETOCONAZOLE 2 % EX CREA: 1.0000 "application " | TOPICAL_CREAM | Freq: Two times a day (BID) | CUTANEOUS | 2 refills | Status: DC

## 2016-08-05 MED ORDER — ACYCLOVIR 400 MG PO TABS: 800.0000 mg | ORAL_TABLET | Freq: Two times a day (BID) | ORAL | 0 refills | Status: DC

## 2016-08-05 MED ORDER — LEVOTHYROXINE SODIUM 125 MCG PO TABS: 125.0000 ug | ORAL_TABLET | Freq: Every day | ORAL | 3 refills | Status: DC

## 2016-08-05 MED FILL — ATORVASTATIN 80 MG TABLET: 80 | 30 days supply | Qty: 30 | Fill #2

## 2016-08-05 MED FILL — ACYCLOVIR 400 MG TABLET: 400 | 5 days supply | Qty: 20 | Fill #0

## 2016-08-05 MED FILL — BUPROPION SR 150 MG TABLET: 150 | 30 days supply | Qty: 60 | Fill #2

## 2016-08-05 MED FILL — KETOCONAZOLE 2% CREAM: 2 | 14 days supply | Qty: 60 | Fill #0

## 2016-08-05 MED FILL — ?PANTOPRAZOLE SOD DR 40MG: 40 MG | 30 days supply | Qty: 60 | Fill #3

## 2016-08-05 MED FILL — PROPRANOLOL 20 MG TABLET: 20 | 30 days supply | Qty: 60 | Fill #2

## 2016-08-05 MED FILL — ?FLUOXETINE HCL 20 MG CAP: 20 | 30 days supply | Qty: 90 | Fill #2

## 2016-08-05 MED FILL — ?LEVOTHYROXINE 125 MCG TABL: 125 | 30 days supply | Qty: 30 | Fill #8

## 2016-08-05 MED FILL — HYDROCHLOROTHIAZIDE 25 MG T: 25 | 30 days supply | Qty: 30 | Fill #0

## 2016-08-05 NOTE — Assessment & Plan Note (Signed)
  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 This is an antiviral

## 2016-08-05 NOTE — Progress Notes (Signed)
Subjective:  Patient ID: Lindsay Rivera, female    DOB: [birth year]  Age: 56 y.o. MRN: [id number]  CC: Rash   HPI TYREANNA BISESI has diabetes, hypothyroidism, HTN, CAD s/p CABG, bipolar disorder she presents for   1. Rash: pruritic rash started in axilla in about 2 months ago. Spread to upper thighs. She came in for evaluation due to concern for HIV and lupus. Screening HIV test was negative. ANA was negative. She was diagnosed with pityriasis rosea and treated with zyrtec. She has hot flashes. No fever or chills.   2. Gluteal lesions: coming and going for 2 years. Burn and itch. Cluster of small blisters.   Social History  Substance Use Topics  . Smoking status: Former Smoker    Packs/day: 0.12    Years: 38.00    Types: Cigarettes    Quit date: 02/08/2013  . Smokeless tobacco: Never Used  . Alcohol use 0.0 oz/week     Comment: 04/17/2015 "I'll have a glass of wine on birthdays, new year's, etc."   Outpatient Medications Prior to Visit  Medication Sig Dispense Refill  . amLODipine (NORVASC) 5 MG tablet Take 1 tablet (5 mg total) by mouth daily. 90 tablet 3  . aspirin EC 81 MG tablet Take 1 tablet (81 mg total) by mouth daily.    Marland Kitchen atorvastatin (LIPITOR) 80 MG tablet TAKE 1 TABLET BY MOUTH AT BEDTIME 30 tablet 7  . buPROPion (WELLBUTRIN XL) 150 MG 24 hr tablet Take 1 tablet (150 mg total) by mouth every evening. 30 tablet 0  . Calcium Carbonate-Vitamin D 600-400 MG-UNIT per chew tablet Chew 1 tablet by mouth daily. Reported on 03/25/2015    . cetirizine (ZYRTEC) 10 MG tablet Take 1 tablet (10 mg total) by mouth daily. 30 tablet 11  . clopidogrel (PLAVIX) 75 MG tablet Take 1 tablet (75 mg total) by mouth daily. Take 4 tablets the first day then 1 tablet daily thereafter 94 tablet 3  . clotrimazole (LOTRIMIN) 1 % cream Apply 1 application topically 2 (two) times daily as needed. Reported on 07/09/2015 60 g 0  . diazepam (VALIUM) 5 MG tablet Take 5 mg by mouth 2 (two) times daily as needed  for anxiety. Reported on 07/09/2015    . famotidine (PEPCID) 20 MG tablet One at bedtime (Patient taking differently: Take 20 mg by mouth daily. One at bedtime) 30 tablet 2  . fluticasone (FLONASE) 50 MCG/ACT nasal spray Place 2 sprays into both nostrils daily. 16 g 3  . hydrochlorothiazide (HYDRODIURIL) 25 MG tablet TAKE 1 TABLET BY MOUTH DAILY 30 tablet 6  . hydrOXYzine (ATARAX/VISTARIL) 25 MG tablet TAKE 1 TABLET BY MOUTH AT BEDTIME. 30 tablet 0  . levothyroxine (SYNTHROID, LEVOTHROID) 125 MCG tablet Take 1 tablet (125 mcg total) by mouth daily. 90 tablet 3  . lidocaine (LIDODERM) 5 % Place 1 patch onto the skin daily as needed (pain). Remove & Discard patch within 12 hours or as directed by MD    . metFORMIN (GLUCOPHAGE XR) 500 MG 24 hr tablet Take 1 tablet (500 mg total) by mouth daily with breakfast. 30 tablet 5  . methocarbamol (ROBAXIN) 500 MG tablet Can take up to 1-2 tabs every 6 hours PRN PAIN 20 tablet 0  . NITROSTAT 0.4 MG SL tablet PLACE 1 TABLET UNDER THE TONGUE EVERY 5 MINS AS NEEDED FOR CHEST PAIN 25 tablet 0  . OLANZapine (ZYPREXA) 20 MG tablet Take 20 mg by mouth at bedtime.    Marland Kitchen  pantoprazole (PROTONIX) 40 MG tablet Take 1 tablet (40 mg total) by mouth 2 (two) times daily. 60 tablet 5  . PARoxetine (PAXIL) 10 MG tablet Take 1 tablet (10 mg total) by mouth daily. 30 tablet 5  . polyethylene glycol powder (MIRALAX) powder Take 17 g by mouth daily. Constipation 850 g 0  . potassium chloride (K-DUR) 10 MEQ tablet Take 1 tablet (10 mEq total) by mouth daily. 30 tablet 11  . QUEtiapine (SEROQUEL) 400 MG tablet Take 600 mg by mouth at bedtime.    Marland Kitchen QUEtiapine (SEROQUEL) 50 MG tablet Take 50 mg by mouth 2 (two) times daily.    . solifenacin (VESICARE) 5 MG tablet Take 1 tablet (5 mg total) by mouth daily. 30 tablet 2  . VENTOLIN HFA 108 (90 Base) MCG/ACT inhaler INHALE 2 PUFFS INTO THE LUNGS EVERY 6 HOURS AS NEEDED FOR WHEEZING. 1 Inhaler 1   No facility-administered medications prior to  visit.     ROS Review of Systems  Constitutional: Negative for chills and fever.  Eyes: Negative for visual disturbance.  Respiratory: Negative for shortness of breath.   Cardiovascular: Negative for chest pain.  Gastrointestinal: Negative for abdominal pain and blood in stool.  Musculoskeletal: Negative for arthralgias and back pain.  Skin: Positive for rash.  Allergic/Immunologic: Negative for immunocompromised state.  Hematological: Negative for adenopathy. Does not bruise/bleed easily.  Psychiatric/Behavioral: Negative for dysphoric mood and suicidal ideas.    Objective:  BP 130/77   Pulse 77   Temp 98.1 F (36.7 C) (Oral)   Wt 169 lb (76.7 kg)   SpO2 100%   BMI 30.91 kg/m   BP/Weight 08/05/2016 06/17/2016 9/93/7169  Systolic BP 678 938 101  Diastolic BP 77 92 72  Wt. (Lbs) 169 169 -  BMI 30.91 30.91 -   Physical Exam  Constitutional: She is oriented to person, place, and time. She appears well-developed and well-nourished. No distress.  HENT:  Head: Normocephalic and atraumatic.  Cardiovascular: Normal rate, regular rhythm, normal heart sounds and intact distal pulses.   Pulmonary/Chest: Effort normal and breath sounds normal.  Musculoskeletal: She exhibits no edema.  Lymphadenopathy:    She has no axillary adenopathy.       Right: No inguinal adenopathy present.       Left: No inguinal adenopathy present.  Neurological: She is alert and oriented to person, place, and time.  Skin: Skin is warm and dry. No rash noted.     Psychiatric: She has a normal mood and affect.    Lab Results  Component Value Date   HGBA1C 6.4 08/05/2016   CBG 135  Lab Results  Component Value Date   TSH 2.360 06/17/2016    Assessment & Plan:  Shaquasia was seen today for rash.  Diagnoses and all orders for this visit:  Diabetes mellitus due to underlying condition without complication, without long-term current use of insulin (HCC) -     POCT glucose (manual entry) -     POCT  glycosylated hemoglobin (Hb A1C)  Other specified hypothyroidism -     levothyroxine (SYNTHROID, LEVOTHROID) 125 MCG tablet; Take 1 tablet (125 mcg total) by mouth daily.  Blister -     HSV(herpes smplx)abs-1+2(IgG+IgM)-bld -     acyclovir (ZOVIRAX) 400 MG tablet; Take 2 tablets (800 mg total) by mouth 2 (two) times daily. For 5 days  Rash -     ketoconazole (NIZORAL) 2 % cream; Apply 1 application topically 2 (two) times daily. To rash under arms  and upper thighs twice daily for 14 days     No orders of the defined types were placed in this encounter.   Follow-up: Return in about 3 weeks (around 08/26/2016) for rash.   Boykin Nearing MD

## 2016-08-05 NOTE — Assessment & Plan Note (Signed)
I suspect tinea versicolor causing dark rash under arms and upper thighs- use nizoral cream for this. This is an antifungal.

## 2016-08-05 NOTE — Patient Instructions (Signed)
Lindsay Rivera was seen today for rash.  Diagnoses and all orders for this visit:  Diabetes mellitus due to underlying condition without complication, without long-term current use of insulin (HCC) -     POCT glucose (manual entry) -     POCT glycosylated hemoglobin (Hb A1C)  Other specified hypothyroidism -     levothyroxine (SYNTHROID, LEVOTHROID) 125 MCG tablet; Take 1 tablet (125 mcg total) by mouth daily.  Blister -     HSV(herpes smplx)abs-1+2(IgG+IgM)-bld -     acyclovir (ZOVIRAX) 400 MG tablet; Take 2 tablets (800 mg total) by mouth 2 (two) times daily. For 5 days  Rash -     ketoconazole (NIZORAL) 2 % cream; Apply 1 application topically 2 (two) times daily. To rash under arms and upper thighs twice daily for 14 days   I suspect tinea versicolor causing dark rash under arms and upper thighs- use nizoral cream for this. This is an antifungal.  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 This is an antiviral   Return in 3 weeks for follow up rash   Dr. Adrian Blackwater   Tinea Versicolor Tinea versicolor is a common fungal infection of the skin. It causes a rash that appears as light or dark patches on the skin. The rash most often occurs on the chest, back, neck, or upper arms. This condition is more common during warm weather. Other than affecting how your skin looks, tinea versicolor usually does not cause other problems. In most cases, the infection goes away in a few weeks with treatment. It may take a few months for the patches on your skin to clear up. What are the causes? Tinea versicolor occurs when a type of fungus that is normally present on the skin starts to overgrow. This fungus is a kind of yeast. The exact cause of the overgrowth is not known. This condition cannot be passed from one person to another (noncontagious). What increases the risk? This condition is more likely to develop when certain factors are present, such as:  Heat and  humidity.  Sweating too much.  Hormone changes.  Oily skin.  A weak defense (immune) system.  What are the signs or symptoms? Symptoms of this condition may include:  A rash on your skin that is made up of light or dark patches. The rash may have: ? Patches of tan or pink spots on light skin. ? Patches of white or brown spots on dark skin. ? Patches of skin that do not tan. ? Well-marked edges. ? Scales on the discolored areas.  Mild itching.  How is this diagnosed? A health care provider can usually diagnose this condition by looking at your skin. During the exam, he or she may use ultraviolet light to help determine the extent of the infection. In some cases, a skin sample may be taken by scraping the rash. This sample will be viewed under a microscope to check for yeast overgrowth. How is this treated? Treatment for this condition may include:  Dandruff shampoo that is applied to the affected skin during showers or bathing.  Over-the-counter medicated skin cream, lotion, or soaps.  Prescription antifungal medicine in the form of skin cream or pills.  Medicine to help reduce itching.  Follow these instructions at home:  Take medicines only as directed by your health care provider.  Apply dandruff shampoo to the affected area if told to do so by your health care provider. You may be instructed to  scrub the affected skin for several minutes each day.  Do not scratch the affected area of skin.  Avoid hot and humid conditions.  Do not use tanning booths.  Try to avoid sweating a lot. Contact a health care provider if:  Your symptoms get worse.  You have a fever.  You have redness, swelling, or pain at the site of your rash.  You have fluid, blood, or pus coming from your rash.  Your rash returns after treatment. This information is not intended to replace advice given to you by your health care provider. Make sure you discuss any questions you have with your  health care provider. Document Released: 01/22/2000 Document Revised: 09/27/2015 Document Reviewed: 11/05/2013 Elsevier Interactive Patient Education  2018 [employer].   Genital Herpes Genital herpes is a common sexually transmitted infection (STI) that is caused by a virus. The virus spreads from person to person through sexual contact. Infection can cause itching, blisters, and sores around the genitals or rectum. Symptoms may last several days and then go away This is called an outbreak. However, the virus remains in your body, so you may have more outbreaks in the future. The time between outbreaks varies and can be months or years. Genital herpes affects men and women. It is particularly concerning for pregnant women because the virus can be passed to the baby during delivery and can cause serious problems. Genital herpes is also a concern for people who have a weak disease-fighting (immune) system. What are the causes? This condition is caused by the herpes simplex virus (HSV) type 1 or type 2. The virus may spread through:  Sexual contact with an infected person, including vaginal, anal, and oral sex.  Contact with fluid from a herpes sore.  The skin. This means that you can get herpes from an infected partner even if he or she does not have a visible sore or does not know that he or she is infected.  What increases the risk? You are more likely to develop this condition if:  You have sex with many partners.  You do not use latex condoms during sex.  What are the signs or symptoms? Most people do not have symptoms (asymptomatic) or have mild symptoms that may be mistaken for other skin problems. Symptoms may include:  Small red bumps near the genitals, rectum, or mouth. These bumps turn into blisters and then turn into sores.  Flu-like symptoms, including: ? Fever. ? Body aches. ? Swollen lymph nodes. ? Headache.  Painful urination.  Pain and itching in the genital area  or rectal area.  Vaginal discharge.  Tingling or shooting pain in the legs and buttocks.  Generally, symptoms are more severe and last longer during the first (primary) outbreak. Flu-like symptoms are also more common during the primary outbreak. How is this diagnosed? Genital herpes may be diagnosed based on:  A physical exam.  Your medical history.  Blood tests.  A test of a fluid sample (culture) from an open sore.  How is this treated? There is no cure for this condition, but treatment with antiviral medicines that are taken by mouth (orally) can do the following:  Speed up healing and relieve symptoms.  Help to reduce the spread of the virus to sexual partners.  Limit the chance of future outbreaks, or make future outbreaks shorter.  Lessen symptoms of future outbreaks.  Your health care provider may also recommend pain relief medicines, such as aspirin or ibuprofen. Follow these instructions at  home: Sexual activity  Do not have sexual contact during active outbreaks.  Practice safe sex. Latex condoms and female condoms may help prevent the spread of the herpes virus. General instructions  Keep the affected areas dry and clean.  Take over-the-counter and prescription medicines only as told by your health care provider.  Avoid rubbing or touching blisters and sores. If you do touch blisters or sores: ? Wash your hands thoroughly with soap and water. ? Do not touch your eyes afterward.  To help relieve pain or itching, you may take the following actions as directed by your health care provider: ? Apply a cold, wet cloth (cold compress) to affected areas 4-6 times a day. ? Apply a substance that protects your skin and reduces bleeding (astringent). ? Apply a gel that helps relieve pain around sores (lidocaine gel). ? Take a warm, shallow bath that cleans the genital area (sitz bath).  Keep all follow-up visits as told by your health care provider. This is  important. How is this prevented?  Use condoms. Although anyone can get genital herpes during sexual contact, even with the use of a condom, a condom can provide some protection.  Avoid having multiple sexual partners.  Talk with your sexual partner about any symptoms either of you may have. Also, talk with your partner about any history of STIs.  Get tested for STIs before you have sex. Ask your partner to do the same.  Do not have sexual contact if you have symptoms of genital herpes. Contact a health care provider if:  Your symptoms are not improving with medicine.  Your symptoms return.  You have new symptoms.  You have a fever.  You have abdominal pain.  You have redness, swelling, or pain in your eye.  You notice new sores on other parts of your body.  You are a woman and experience bleeding between menstrual periods.  You have had herpes and you become pregnant or plan to become pregnant. Summary  Genital herpes is a common sexually transmitted infection (STI) that is caused by the herpes simplex virus (HSV) type 1 or type 2.  These viruses are most often spread through sexual contact with an infected person.  You are more likely to develop this condition if you have sex with many partners or you have unprotected sex.  Most people do not have symptoms (asymptomatic) or have mild symptoms that may be mistaken for other skin problems. Symptoms occur as outbreaks that may happen months or years apart.  There is no cure for this condition, but treatment with oral antiviral medicines can reduce symptoms, reduce the chance of spreading the virus to a partner, prevent future outbreaks, or shorten future outbreaks. This information is not intended to replace advice given to you by your health care provider. Make sure you discuss any questions you have with your health care provider. Document Released: 01/22/2000 Document Revised: 12/25/2015 Document Reviewed:  12/25/2015 Elsevier Interactive Patient Education  2017 [employer].

## 2016-08-09 LAB — HSV(HERPES SMPLX)ABS-I+II(IGG+IGM)-BLD
HSV 1 Glycoprotein G Ab, IgG: 22.6 index — ABNORMAL HIGH (ref 0.00–0.90)
HSV 2 Glycoprotein G Ab, IgG: 4.64 index — ABNORMAL HIGH (ref 0.00–0.90)

## 2016-08-12 ENCOUNTER — Telehealth: Payer: Self-pay

## 2016-08-12 NOTE — Telephone Encounter (Signed)
Pt was called and informed of lab results. 

## 2016-08-26 ENCOUNTER — Ambulatory Visit: Payer: Self-pay | Admitting: Family Medicine

## 2016-09-19 MED FILL — HYDROCHLOROTHIAZIDE 25 MG T: 25 | 30 days supply | Qty: 30 | Fill #1

## 2016-09-19 MED FILL — ?LEVOTHYROXINE 125 MCG TABL: 125 | 30 days supply | Qty: 30 | Fill #9

## 2016-09-19 MED FILL — ?PANTOPRAZOLE SOD DR 40MG: 40 MG | 30 days supply | Qty: 60 | Fill #4

## 2016-09-20 ENCOUNTER — Ambulatory Visit: Payer: Self-pay | Attending: Internal Medicine | Admitting: Physician Assistant

## 2016-09-20 ENCOUNTER — Other Ambulatory Visit: Payer: Self-pay

## 2016-09-20 ENCOUNTER — Emergency Department (HOSPITAL_COMMUNITY): Payer: Self-pay

## 2016-09-20 ENCOUNTER — Encounter (HOSPITAL_COMMUNITY): Payer: Self-pay

## 2016-09-20 ENCOUNTER — Observation Stay (HOSPITAL_COMMUNITY)
Admission: EM | Admit: 2016-09-20 | Discharge: 2016-09-21 | Disposition: A | Discharge status: Home / Self Care | Payer: Self-pay | Attending: Cardiovascular Disease | Admitting: Cardiovascular Disease

## 2016-09-20 VITALS — BP 102/67 | HR 79 | Resp 16 | Wt 166.7 lb

## 2016-09-20 DIAGNOSIS — E038 Other specified hypothyroidism: Secondary | ICD-10-CM | POA: Insufficient documentation

## 2016-09-20 DIAGNOSIS — Z8673 Personal history of transient ischemic attack (TIA), and cerebral infarction without residual deficits: Secondary | ICD-10-CM | POA: Insufficient documentation

## 2016-09-20 DIAGNOSIS — K056 Periodontal disease, unspecified: Secondary | ICD-10-CM | POA: Insufficient documentation

## 2016-09-20 DIAGNOSIS — I110 Hypertensive heart disease with heart failure: Secondary | ICD-10-CM | POA: Insufficient documentation

## 2016-09-20 DIAGNOSIS — R32 Unspecified urinary incontinence: Secondary | ICD-10-CM | POA: Insufficient documentation

## 2016-09-20 DIAGNOSIS — Z87891 Personal history of nicotine dependence: Secondary | ICD-10-CM | POA: Insufficient documentation

## 2016-09-20 DIAGNOSIS — Z7982 Long term (current) use of aspirin: Secondary | ICD-10-CM | POA: Insufficient documentation

## 2016-09-20 DIAGNOSIS — F419 Anxiety disorder, unspecified: Secondary | ICD-10-CM | POA: Insufficient documentation

## 2016-09-20 DIAGNOSIS — Z90710 Acquired absence of both cervix and uterus: Secondary | ICD-10-CM | POA: Insufficient documentation

## 2016-09-20 DIAGNOSIS — I252 Old myocardial infarction: Secondary | ICD-10-CM | POA: Insufficient documentation

## 2016-09-20 DIAGNOSIS — X58XXXA Exposure to other specified factors, initial encounter: Secondary | ICD-10-CM | POA: Insufficient documentation

## 2016-09-20 DIAGNOSIS — I2510 Atherosclerotic heart disease of native coronary artery without angina pectoris: Secondary | ICD-10-CM | POA: Insufficient documentation

## 2016-09-20 DIAGNOSIS — E7800 Pure hypercholesterolemia, unspecified: Secondary | ICD-10-CM

## 2016-09-20 DIAGNOSIS — L42 Pityriasis rosea: Secondary | ICD-10-CM | POA: Insufficient documentation

## 2016-09-20 DIAGNOSIS — Z7902 Long term (current) use of antithrombotics/antiplatelets: Secondary | ICD-10-CM | POA: Insufficient documentation

## 2016-09-20 DIAGNOSIS — N951 Menopausal and female climacteric states: Secondary | ICD-10-CM | POA: Insufficient documentation

## 2016-09-20 DIAGNOSIS — I4581 Long QT syndrome: Secondary | ICD-10-CM | POA: Insufficient documentation

## 2016-09-20 DIAGNOSIS — I25119 Atherosclerotic heart disease of native coronary artery with unspecified angina pectoris: Secondary | ICD-10-CM

## 2016-09-20 DIAGNOSIS — Z6829 Body mass index (BMI) 29.0-29.9, adult: Secondary | ICD-10-CM | POA: Insufficient documentation

## 2016-09-20 DIAGNOSIS — E785 Hyperlipidemia, unspecified: Secondary | ICD-10-CM | POA: Insufficient documentation

## 2016-09-20 DIAGNOSIS — Z833 Family history of diabetes mellitus: Secondary | ICD-10-CM | POA: Insufficient documentation

## 2016-09-20 DIAGNOSIS — Z8249 Family history of ischemic heart disease and other diseases of the circulatory system: Secondary | ICD-10-CM | POA: Insufficient documentation

## 2016-09-20 DIAGNOSIS — Z79899 Other long term (current) drug therapy: Secondary | ICD-10-CM | POA: Insufficient documentation

## 2016-09-20 DIAGNOSIS — E119 Type 2 diabetes mellitus without complications: Secondary | ICD-10-CM | POA: Insufficient documentation

## 2016-09-20 DIAGNOSIS — E089 Diabetes mellitus due to underlying condition without complications: Secondary | ICD-10-CM

## 2016-09-20 DIAGNOSIS — Z7984 Long term (current) use of oral hypoglycemic drugs: Secondary | ICD-10-CM | POA: Insufficient documentation

## 2016-09-20 DIAGNOSIS — R072 Precordial pain: Secondary | ICD-10-CM

## 2016-09-20 DIAGNOSIS — I509 Heart failure, unspecified: Secondary | ICD-10-CM | POA: Insufficient documentation

## 2016-09-20 DIAGNOSIS — Z955 Presence of coronary angioplasty implant and graft: Secondary | ICD-10-CM | POA: Insufficient documentation

## 2016-09-20 DIAGNOSIS — I200 Unstable angina: Secondary | ICD-10-CM | POA: Diagnosis present

## 2016-09-20 DIAGNOSIS — L989 Disorder of the skin and subcutaneous tissue, unspecified: Secondary | ICD-10-CM | POA: Insufficient documentation

## 2016-09-20 DIAGNOSIS — G43909 Migraine, unspecified, not intractable, without status migrainosus: Secondary | ICD-10-CM | POA: Insufficient documentation

## 2016-09-20 DIAGNOSIS — K5900 Constipation, unspecified: Secondary | ICD-10-CM | POA: Insufficient documentation

## 2016-09-20 DIAGNOSIS — I10 Essential (primary) hypertension: Secondary | ICD-10-CM

## 2016-09-20 DIAGNOSIS — K219 Gastro-esophageal reflux disease without esophagitis: Secondary | ICD-10-CM | POA: Insufficient documentation

## 2016-09-20 DIAGNOSIS — L819 Disorder of pigmentation, unspecified: Secondary | ICD-10-CM

## 2016-09-20 DIAGNOSIS — F319 Bipolar disorder, unspecified: Secondary | ICD-10-CM | POA: Insufficient documentation

## 2016-09-20 DIAGNOSIS — T148XXA Other injury of unspecified body region, initial encounter: Secondary | ICD-10-CM

## 2016-09-20 DIAGNOSIS — J45909 Unspecified asthma, uncomplicated: Secondary | ICD-10-CM | POA: Insufficient documentation

## 2016-09-20 DIAGNOSIS — E039 Hypothyroidism, unspecified: Secondary | ICD-10-CM | POA: Insufficient documentation

## 2016-09-20 DIAGNOSIS — D649 Anemia, unspecified: Secondary | ICD-10-CM | POA: Insufficient documentation

## 2016-09-20 DIAGNOSIS — Z880 Allergy status to penicillin: Secondary | ICD-10-CM | POA: Insufficient documentation

## 2016-09-20 DIAGNOSIS — I25110 Atherosclerotic heart disease of native coronary artery with unstable angina pectoris: Principal | ICD-10-CM | POA: Insufficient documentation

## 2016-09-20 DIAGNOSIS — Z91040 Latex allergy status: Secondary | ICD-10-CM | POA: Insufficient documentation

## 2016-09-20 DIAGNOSIS — Z951 Presence of aortocoronary bypass graft: Secondary | ICD-10-CM | POA: Insufficient documentation

## 2016-09-20 DIAGNOSIS — G8929 Other chronic pain: Secondary | ICD-10-CM | POA: Insufficient documentation

## 2016-09-20 DIAGNOSIS — Z800 Family history of malignant neoplasm of digestive organs: Secondary | ICD-10-CM | POA: Insufficient documentation

## 2016-09-20 DIAGNOSIS — M545 Low back pain: Secondary | ICD-10-CM | POA: Insufficient documentation

## 2016-09-20 DIAGNOSIS — R079 Chest pain, unspecified: Secondary | ICD-10-CM | POA: Insufficient documentation

## 2016-09-20 LAB — TROPONIN I: Troponin I: <0.03 ng/mL (ref <0.03)

## 2016-09-20 LAB — BASIC METABOLIC PANEL
ANION GAP: 8 (ref 5–15)
BUN: 8 mg/dL (ref 6–20)
CHLORIDE: 102 mmol/L (ref 101–111)
CO2: 29 mmol/L (ref 22–32)
Calcium: 9.4 mg/dL (ref 8.9–10.3)
Creatinine, Ser: 0.77 mg/dL (ref 0.44–1.00)
Glucose, Bld: 112 mg/dL — ABNORMAL HIGH (ref 65–99)
POTASSIUM: 3 mmol/L — AB (ref 3.5–5.1)
SODIUM: 139 mmol/L (ref 135–145)

## 2016-09-20 LAB — CBC
HEMATOCRIT: 37.5 % (ref 36.0–46.0)
Hemoglobin: 12.4 g/dL (ref 12.0–15.0)
MCH: 26.8 pg (ref 26.0–34.0)
MCHC: 33.1 g/dL (ref 30.0–36.0)
MCV: 81 fL (ref 78.0–100.0)
Platelets: 488 10*3/uL — ABNORMAL HIGH (ref 150–400)
RBC: 4.63 MIL/uL (ref 3.87–5.11)
RDW: 15.6 % — AB (ref 11.5–15.5)
WBC: 9.3 10*3/uL (ref 4.0–10.5)

## 2016-09-20 LAB — URINALYSIS, ROUTINE W REFLEX MICROSCOPIC
Bacteria, UA: NONE SEEN
Bilirubin Urine: NEGATIVE
GLUCOSE, UA: NEGATIVE mg/dL
Ketones, ur: NEGATIVE mg/dL
Leukocytes, UA: NEGATIVE
NITRITE: NEGATIVE
Protein, ur: NEGATIVE mg/dL
RBC / HPF: NONE SEEN RBC/hpf (ref 0–5)
Specific Gravity, Urine: 1.012 (ref 1.005–1.030)
pH: 6 (ref 5.0–8.0)

## 2016-09-20 LAB — I-STAT TROPONIN, ED: Troponin i, poc: 0.01 ng/mL (ref 0.00–0.08)

## 2016-09-20 LAB — GLUCOSE, CAPILLARY: GLUCOSE-CAPILLARY: 158 mg/dL — AB (ref 65–99)

## 2016-09-20 LAB — GLUCOSE, POCT (MANUAL RESULT ENTRY): POC Glucose: 117 mg/dl — AB (ref 70–99)

## 2016-09-20 MED ORDER — ONDANSETRON HCL 4 MG/2ML IJ SOLN: 4.0000 mg | Freq: Four times a day (QID) | INTRAMUSCULAR | Status: DC | PRN

## 2016-09-20 MED ORDER — BUPROPION HCL ER (XL) 150 MG PO TB24
150.0000 mg | ORAL_TABLET | Freq: Every evening | ORAL | Status: DC
  Administered 2016-09-20: 150 mg via ORAL
  Filled 2016-09-20 (×2): qty 1

## 2016-09-20 MED ORDER — PAROXETINE HCL 10 MG PO TABS
10.0000 mg | ORAL_TABLET | Freq: Every day | ORAL | Status: DC
  Filled 2016-09-20: qty 1

## 2016-09-20 MED ORDER — ACYCLOVIR 800 MG PO TABS
800.0000 mg | ORAL_TABLET | Freq: Two times a day (BID) | ORAL | Status: DC
  Administered 2016-09-20: 800 mg via ORAL
  Filled 2016-09-20 (×2): qty 1

## 2016-09-20 MED ORDER — ASPIRIN 81 MG PO CHEW
324.0000 mg | CHEWABLE_TABLET | Freq: Once | ORAL | Status: AC
  Administered 2016-09-20: 324 mg via ORAL
  Filled 2016-09-20: qty 4

## 2016-09-20 MED ORDER — ATORVASTATIN CALCIUM 80 MG PO TABS
80.0000 mg | ORAL_TABLET | Freq: Every day | ORAL | Status: DC
  Administered 2016-09-20: 80 mg via ORAL
  Filled 2016-09-20: qty 1

## 2016-09-20 MED ORDER — HYDROCHLOROTHIAZIDE 25 MG PO TABS
25.0000 mg | ORAL_TABLET | Freq: Every day | ORAL | Status: DC
  Filled 2016-09-20: qty 1

## 2016-09-20 MED ORDER — DIAZEPAM 5 MG PO TABS: 5.0000 mg | ORAL_TABLET | Freq: Two times a day (BID) | ORAL | Status: DC | PRN

## 2016-09-20 MED ORDER — OLANZAPINE 10 MG PO TABS
20.0000 mg | ORAL_TABLET | Freq: Every day | ORAL | Status: DC
  Administered 2016-09-20: 20 mg via ORAL
  Filled 2016-09-20: qty 2

## 2016-09-20 MED ORDER — ASPIRIN EC 81 MG PO TBEC
81.0000 mg | DELAYED_RELEASE_TABLET | Freq: Every day | ORAL | Status: DC
  Filled 2016-09-20: qty 1

## 2016-09-20 MED ORDER — POTASSIUM CHLORIDE CRYS ER 10 MEQ PO TBCR
40.0000 meq | EXTENDED_RELEASE_TABLET | Freq: Every day | ORAL | Status: DC
  Administered 2016-09-20: 40 meq via ORAL
  Filled 2016-09-20 (×2): qty 4

## 2016-09-20 MED ORDER — POLYETHYLENE GLYCOL 3350 17 G PO PACK: 17.0000 g | PACK | Freq: Every day | ORAL | Status: DC | PRN

## 2016-09-20 MED ORDER — QUETIAPINE FUMARATE 50 MG PO TABS
50.0000 mg | ORAL_TABLET | Freq: Two times a day (BID) | ORAL | Status: DC
  Filled 2016-09-20: qty 2
  Filled 2016-09-20 (×2): qty 1

## 2016-09-20 MED ORDER — PANTOPRAZOLE SODIUM 40 MG PO TBEC
40.0000 mg | DELAYED_RELEASE_TABLET | Freq: Two times a day (BID) | ORAL | Status: DC
  Administered 2016-09-20: 40 mg via ORAL
  Filled 2016-09-20 (×2): qty 1

## 2016-09-20 MED ORDER — LORATADINE 10 MG PO TABS
10.0000 mg | ORAL_TABLET | Freq: Every day | ORAL | Status: DC
  Filled 2016-09-20: qty 1

## 2016-09-20 MED ORDER — INSULIN ASPART 100 UNIT/ML ~~LOC~~ SOLN: 0.0000 [IU] | Freq: Three times a day (TID) | SUBCUTANEOUS | Status: DC

## 2016-09-20 MED ORDER — POTASSIUM CHLORIDE CRYS ER 20 MEQ PO TBCR
40.0000 meq | EXTENDED_RELEASE_TABLET | Freq: Once | ORAL | Status: AC
  Administered 2016-09-20: 40 meq via ORAL
  Filled 2016-09-20: qty 2

## 2016-09-20 MED ORDER — ACYCLOVIR 400 MG PO TABS: 800.0000 mg | ORAL_TABLET | Freq: Two times a day (BID) | ORAL | 0 refills | Status: DC

## 2016-09-20 MED ORDER — LEVOTHYROXINE SODIUM 25 MCG PO TABS
125.0000 ug | ORAL_TABLET | Freq: Every day | ORAL | Status: DC
  Administered 2016-09-21: 125 ug via ORAL
  Filled 2016-09-20: qty 5

## 2016-09-20 MED ORDER — ACYCLOVIR 400 MG PO TABS: 800.0000 mg | ORAL_TABLET | Freq: Two times a day (BID) | ORAL | 5 refills | Status: DC

## 2016-09-20 MED ORDER — ALBUTEROL SULFATE (2.5 MG/3ML) 0.083% IN NEBU: 3.0000 mL | INHALATION_SOLUTION | Freq: Four times a day (QID) | RESPIRATORY_TRACT | Status: DC | PRN

## 2016-09-20 MED ORDER — CLOPIDOGREL BISULFATE 75 MG PO TABS
75.0000 mg | ORAL_TABLET | Freq: Every day | ORAL | Status: DC
  Filled 2016-09-20: qty 1

## 2016-09-20 MED ORDER — FLUTICASONE PROPIONATE 50 MCG/ACT NA SUSP
2.0000 | Freq: Every day | NASAL | Status: DC
  Filled 2016-09-20: qty 16

## 2016-09-20 MED ORDER — FAMOTIDINE 20 MG PO TABS
20.0000 mg | ORAL_TABLET | Freq: Every day | ORAL | Status: DC
  Filled 2016-09-20: qty 1

## 2016-09-20 MED ORDER — DARIFENACIN HYDROBROMIDE ER 7.5 MG PO TB24
7.5000 mg | ORAL_TABLET | Freq: Every day | ORAL | Status: DC
  Filled 2016-09-20: qty 1

## 2016-09-20 MED ORDER — NITROGLYCERIN 0.4 MG SL SUBL: 0.4000 mg | SUBLINGUAL_TABLET | SUBLINGUAL | Status: DC | PRN

## 2016-09-20 MED ORDER — AMLODIPINE BESYLATE 5 MG PO TABS
5.0000 mg | ORAL_TABLET | Freq: Every day | ORAL | Status: DC
  Filled 2016-09-20: qty 1

## 2016-09-20 MED ORDER — HEPARIN BOLUS VIA INFUSION
3500.0000 [IU] | Freq: Once | INTRAVENOUS | Status: AC
  Administered 2016-09-21: 3500 [IU] via INTRAVENOUS
  Filled 2016-09-20: qty 3500

## 2016-09-20 MED ORDER — ACETAMINOPHEN 325 MG PO TABS
650.0000 mg | ORAL_TABLET | ORAL | Status: DC | PRN
  Administered 2016-09-20: 650 mg via ORAL
  Filled 2016-09-20: qty 2

## 2016-09-20 MED ORDER — QUETIAPINE FUMARATE 300 MG PO TABS
600.0000 mg | ORAL_TABLET | Freq: Every day | ORAL | Status: DC
  Administered 2016-09-20: 600 mg via ORAL
  Filled 2016-09-20: qty 2

## 2016-09-20 MED ORDER — HEPARIN (PORCINE) IN NACL 100-0.45 UNIT/ML-% IJ SOLN
800.0000 [IU]/h | INTRAMUSCULAR | Status: DC
  Administered 2016-09-20 – 2016-09-21 (×2): 800 [IU]/h via INTRAVENOUS
  Filled 2016-09-20 (×2): qty 250

## 2016-09-20 MED ORDER — CALCIUM CARBONATE-VITAMIN D 500-200 MG-UNIT PO TABS
1.0000 | ORAL_TABLET | Freq: Every day | ORAL | Status: DC
  Filled 2016-09-20: qty 1

## 2016-09-20 MED FILL — ACYCLOVIR 400 MG TABLET: 400 | 5 days supply | Qty: 20 | Fill #0

## 2016-09-20 NOTE — Progress Notes (Signed)
Patient ID: Lindsay Rivera, female   DOB: 12-19-1960, 56 y.o.   MRN: [id number]   Lindsay Rivera, is a 56 y.o. female  AYT:[id number]  [id number]  DOB - 26-Aug-1960  Subjective:  Chief Complaint and HPI: [name] is a 56 y.o. 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  En route she developed L arm paresthesias, CP, and SOB.  She took a Ntg and had complete resolution of her CP within 10 mins.  This was just PTA.  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  She wants to see a specialist. No f/c.    ROS:   Constitutional:  No f/c, No night sweats, No unexplained weight loss. EENT:  No vision changes, No blurry vision, No hearing changes. No mouth, throat, or ear problems.  Respiratory: No cough, + SOB Cardiac: +CP, no palpitations GI:  No abd pain, No N/V/D. GU: No Urinary s/sx Musculoskeletal: No joint pain Neuro: No headache, no dizziness, no motor weakness. L arm paresthesias  Skin: No rash Endocrine:  No polydipsia. No polyuria.  Psych: Denies SI/HI  No problems updated.  ALLERGIES: Allergies  Allergen Reactions  . Penicillins Swelling    Has patient had a PCN reaction causing immediate rash, facial/tongue/throat swelling, SOB or lightheadedness with hypotension: No Has patient had a PCN reaction causing severe rash involving mucus membranes or skin necrosis: No Has patient had a PCN reaction that required hospitalization No Has patient had a PCN reaction occurring within the last 10 years: No If all of the above answers are "NO", then may proceed with Cephalosporin use.   . Latex Rash    PAST MEDICAL HISTORY: Past Medical History:  Diagnosis Date  . Anemia   . Anxiety   . Arthritis    "lower back" (04/17/2015)  . Asthma   . Bipolar disorder ([place])   . CHF (congestive heart failure) ([place])   . Chronic lower  back pain   . Constipation   . CVA (cerebral vascular accident) ([place]) 2010   denies residual on 04/17/2015  . Depression   . GERD (gastroesophageal reflux disease)   . Hyperlipidemia   . Hypertension   . Hypothyroidism   . MI (myocardial infarction) ([place]) 02/08/2013  . Migraine    "q 3-4 months" (04/17/2015)  . Type II diabetes mellitus ([place])    "diet controlled" (04/17/2015)    MEDICATIONS AT HOME: Prior to Admission medications   Medication Sig Start Date End Date Taking? Authorizing Provider  acyclovir (ZOVIRAX) 400 MG tablet Take 2 tablets (800 mg total) by mouth 2 (two) times daily. For 5 days 09/20/16  Yes Argentina Donovan, PA-C  amLODipine (NORVASC) 5 MG tablet Take 1 tablet (5 mg total) by mouth daily. 04/28/15  Yes Barrett, Evelene Croon, PA-C  aspirin EC 81 MG tablet Take 1 tablet (81 mg total) by mouth daily. 04/15/15  Yes Nahser, Wonda Cheng, MD  atorvastatin (LIPITOR) 80 MG tablet TAKE 1 TABLET BY MOUTH AT BEDTIME 04/20/16  Yes Nahser, Wonda Cheng, MD  buPROPion (WELLBUTRIN XL) 150 MG 24 hr tablet Take 1 tablet (150 mg total) by mouth every evening. 02/13/14  Yes Advani, Vernon Prey, MD  Calcium Carbonate-Vitamin D 600-400 MG-UNIT per chew tablet Chew 1 tablet by mouth daily. Reported on 03/25/2015   Yes [provider]  cetirizine (ZYRTEC) 10 MG tablet Take 1 tablet (10 mg total) by mouth daily. 06/17/16  Yes Argentina Donovan, PA-C  clopidogrel (PLAVIX) 75 MG tablet Take 1 tablet (75 mg total) by mouth daily. Take 4 tablets the first day then 1 tablet daily thereafter 07/14/15  Yes Nahser, Wonda Cheng, MD  fluticasone [facility]) 50 MCG/ACT nasal spray Place 2 sprays into both nostrils daily. 07/09/15  Yes Ryleeann Urquiza, Levada Dy M, PA-C  hydrochlorothiazide (HYDRODIURIL) 25 MG tablet TAKE 1 TABLET BY MOUTH DAILY 08/05/16  Yes Nahser, Wonda Cheng, MD  hydrOXYzine (ATARAX/VISTARIL) 25 MG tablet TAKE 1 TABLET BY MOUTH AT BEDTIME. 03/15/16  Yes Funches, Josalyn, MD  ketoconazole (NIZORAL) 2 % cream Apply 1  application topically 2 (two) times daily. To rash under arms and upper thighs twice daily for 14 days 08/05/16  Yes Funches, Josalyn, MD  levothyroxine (SYNTHROID, LEVOTHROID) 125 MCG tablet Take 1 tablet (125 mcg total) by mouth daily. 08/05/16  Yes Funches, Josalyn, MD  lidocaine (LIDODERM) 5 % Place 1 patch onto the skin daily as needed (pain). Remove & Discard patch within 12 hours or as directed by MD   Yes [provider]  metFORMIN (GLUCOPHAGE XR) 500 MG 24 hr tablet Take 1 tablet (500 mg total) by mouth daily with breakfast. 10/23/15  Yes Funches, Josalyn, MD  methocarbamol (ROBAXIN) 500 MG tablet Can take up to 1-2 tabs every 6 hours PRN PAIN 02/26/16  Yes Pisciotta, Nicole, PA-C  NITROSTAT 0.4 MG SL tablet PLACE 1 TABLET UNDER THE TONGUE EVERY 5 MINS AS NEEDED FOR CHEST PAIN 04/16/15  Yes Jegede, Olugbemiga E, MD  OLANZapine (ZYPREXA) 20 MG tablet Take 20 mg by mouth at bedtime.   Yes [provider]  pantoprazole (PROTONIX) 40 MG tablet Take 1 tablet (40 mg total) by mouth 2 (two) times daily. 04/22/16  Yes Funches, Josalyn, MD  clotrimazole (LOTRIMIN) 1 % cream Apply 1 application topically 2 (two) times daily as needed. Reported on 07/09/2015 07/09/15   Argentina Donovan, PA-C  diazepam (VALIUM) 5 MG tablet Take 5 mg by mouth 2 (two) times daily as needed for anxiety. Reported on 07/09/2015    [provider]  famotidine (PEPCID) 20 MG tablet One at bedtime Patient taking differently: Take 20 mg by mouth daily. One at bedtime 12/25/15   Tanda Rockers, MD  PARoxetine (PAXIL) 10 MG tablet Take 1 tablet (10 mg total) by mouth daily. 02/22/16   Funches, Adriana Mccallum, MD  polyethylene glycol powder (MIRALAX) powder Take 17 g by mouth daily. Constipation 12/01/15   Funches, Adriana Mccallum, MD  potassium chloride (K-DUR) 10 MEQ tablet Take 1 tablet (10 mEq total) by mouth daily. 08/03/15   Nahser, Wonda Cheng, MD  QUEtiapine (SEROQUEL) 400 MG tablet Take 600 mg by mouth at bedtime.    [provider]  QUEtiapine (SEROQUEL) 50 MG tablet Take 50 mg by mouth 2 (two) times daily.    [provider]  solifenacin (VESICARE) 5 MG tablet Take 1 tablet (5 mg total) by mouth daily. 02/22/16   Funches, Adriana Mccallum, MD  VENTOLIN HFA 108 (90 Base) MCG/ACT inhaler INHALE 2 PUFFS INTO THE LUNGS EVERY 6 HOURS AS NEEDED FOR WHEEZING. 04/20/16   Funches, Adriana Mccallum, MD     Objective:  EXAM:   Vitals:   09/20/16 1128  BP: 102/67  Pulse: 79  Resp: 16  SpO2: 99%  Weight: 166 lb 11.2 oz (75.6 kg)    General appearance : A&OX3. NAD. Non-toxic-appearing, appears flushed, is  lying on examining table when I enter the room.  Glucose is 117.  No CP currently HEENT: Atraumatic and Normocephalic.  PERRLA. EOM intact.  TM clear B. Mouth-MMM, post pharynx WNL w/o erythema, No PND. Vesicular lesions R corner of mouth Neck: supple, no JVD. No cervical lymphadenopathy. No thyromegaly Chest/Lungs:  Breathing-non-labored, Good air entry bilaterally, breath sounds normal without rales, rhonchi, or wheezing  CVS: S1 S2 regular, no murmurs, gallops, rubs  Extremities: Bilateral Lower Ext shows no edema, both legs are warm to touch with = pulse throughout Neurology:  CN II-XII grossly intact, Non focal.   Psych:  TP linear. J/I WNL. Normal speech. Appropriate eye contact and affect.  Skin:  Rash unchanged(see note from May 2018);  No diaphoresis.  Data Review Lab Results  Component Value Date   HGBA1C 6.4 08/05/2016   HGBA1C 6.3 10/23/2015   HGBA1C 6.1 07/09/2015     Assessment & Plan   1. Chest pain due to CAD To ED.  Decline EMS transport.  She was driven by a family member and they have agreed to go directly to the ED across the street. They have been instructed to call 911 if there is any return of symptoms. VS stable and she is pain free leaving our office.  Discussed case with Dr Ned Clines and reviewed EKG with him.    2. Essential hypertension Normal BP today.   - Glucose (CBG)  3.  Diabetes mellitus due to underlying condition without complication, without long-term current use of insulin (HCC) - Glucose (CBG)  4. Blister - acyclovir (ZOVIRAX) 400 MG tablet; Take 2 tablets (800 mg total) by mouth 2 (two) times daily. For 5 days  Dispense: 20 tablet; Refill: 5  5. Hyperpigmented skin lesion - Ambulatory referral to Dermatology  Patient attended to emergently.   Patient have been counseled extensively about nutrition and exercise  Return in about 1 week (around 09/27/2016) for assign new PCP and f/up CP.  The patient was given clear instructions to go to ER or return to medical center if symptoms don't improve, worsen or new problems develop. The patient verbalized understanding. The patient was told to call to get lab results if they haven't heard anything in the next week.     Freeman Caldron, PA-C [facility] and [place], [place]   09/20/2016, 1:00 PM

## 2016-09-20 NOTE — ED Triage Notes (Signed)
Per Pt, Pt was on her way to a Clinic visit when she started to have left-sided chest pain. Pt reports taking Nitro and pain decreasing. Original episode came with some SOB. Clinic took EKG and noted changes. Pt denies pain at this time.

## 2016-09-20 NOTE — Progress Notes (Signed)
ANTICOAGULATION CONSULT NOTE - Initial Consult  Pharmacy Consult for heparin  Indication: chest pain/ACS  Allergies  Allergen Reactions  . Penicillins Swelling    Has patient had a PCN reaction causing immediate rash, facial/tongue/throat swelling, SOB or lightheadedness with hypotension: No Has patient had a PCN reaction causing severe rash involving mucus membranes or skin necrosis: No Has patient had a PCN reaction that required hospitalization No Has patient had a PCN reaction occurring within the last 10 years: No If all of the above answers are "NO", then may proceed with Cephalosporin use.   . Latex Rash    Patient Measurements: Height: 5\' 2"  (157.5 cm) Weight: 166 lb (75.3 kg) IBW/kg (Calculated) : 50.1 Heparin Dosing Weight: 61  Vital Signs: Temp: 98.2 F (36.8 C) (08/14 1220) Temp Source: Oral (08/14 1220) BP: 132/76 (08/14 2100) Pulse Rate: 67 (08/14 2100)  Labs:  Recent Labs  09/20/16 1219  HGB 12.4  HCT 37.5  PLT 488*  CREATININE 0.77    Estimated Creatinine Clearance: 74.6 mL/min (by C-G formula based on SCr of 0.77 mg/dL).   Medical History: Past Medical History:  Diagnosis Date  . Anemia   . Anxiety   . Arthritis    "lower back" (04/17/2015)  . Asthma   . Bipolar disorder ([place])   . CHF (congestive heart failure) ([place])   . Chronic lower back pain   . Constipation   . CVA (cerebral vascular accident) ([place]) 2010   denies residual on 04/17/2015  . Depression   . GERD (gastroesophageal reflux disease)   . Hyperlipidemia   . Hypertension   . Hypothyroidism   . MI (myocardial infarction) ([place]) 02/08/2013  . Migraine    "q 3-4 months" (04/17/2015)  . Type II diabetes mellitus ([place])    "diet controlled" (04/17/2015)    Assessment: 56 yo female to start heparin infusion for CP. No known a/c PTA. CBC stable.   Goal of Therapy:  Heparin level 0.3-0.7 units/ml Monitor platelets by anticoagulation protocol: Yes   Plan:  1. Give 3500 units  bolus x 1 2. Start heparin infusion at 800 units/hr 3. Check anti-Xa level in 6 hours and daily while on heparin 4. Continue to monitor H&H and platelets  Vincenza Hews, PharmD, BCPS 09/20/2016, 9:34 PM
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
## 2016-09-20 NOTE — ED Notes (Signed)
Dinner tray ordered.

## 2016-09-20 NOTE — ED Notes (Signed)
Heart healthy dinner ordered for patient, provided with gingerale and crackers and peanut butter in the meantime.

## 2016-09-20 NOTE — Progress Notes (Signed)
Follow up OV rash and medication : acyclovir  While checking in for appointment,  patient informed clerical she had chest pain and needed to take NTG while in route to appointment. Pain in left arm and tingling in fingers, running to chest ( "same symptoms that I Had before when I had my heart attack") NTG 15 min ago at 11:25 am Pain 10/10  Denies pain presently since NTG Pt alert and oriented, in NAD

## 2016-09-21 ENCOUNTER — Other Ambulatory Visit: Payer: Self-pay

## 2016-09-21 LAB — LIPID PANEL
Cholesterol: 137 mg/dL (ref 0–200)
HDL: 45 mg/dL (ref >40)
LDL Cholesterol: 59 mg/dL (ref 0–99)
Total CHOL/HDL Ratio: 3 RATIO
Triglycerides: 166 mg/dL — ABNORMAL HIGH (ref <150)
VLDL: 33 mg/dL (ref 0–40)

## 2016-09-21 LAB — CBC
HEMATOCRIT: 34.5 % — AB (ref 36.0–46.0)
HEMOGLOBIN: 11.4 g/dL — AB (ref 12.0–15.0)
MCH: 26.8 pg (ref 26.0–34.0)
MCHC: 33 g/dL (ref 30.0–36.0)
MCV: 81.2 fL (ref 78.0–100.0)
Platelets: 421 10*3/uL — ABNORMAL HIGH (ref 150–400)
RBC: 4.25 MIL/uL (ref 3.87–5.11)
RDW: 15.9 % — ABNORMAL HIGH (ref 11.5–15.5)
WBC: 9 10*3/uL (ref 4.0–10.5)

## 2016-09-21 LAB — BASIC METABOLIC PANEL
ANION GAP: 9 (ref 5–15)
BUN: 7 mg/dL (ref 6–20)
CALCIUM: 9.2 mg/dL (ref 8.9–10.3)
CO2: 30 mmol/L (ref 22–32)
CREATININE: 0.83 mg/dL (ref 0.44–1.00)
Chloride: 103 mmol/L (ref 101–111)
GLUCOSE: 126 mg/dL — AB (ref 65–99)
Potassium: 3.2 mmol/L — ABNORMAL LOW (ref 3.5–5.1)
Sodium: 142 mmol/L (ref 135–145)

## 2016-09-21 LAB — GLUCOSE, CAPILLARY
Glucose-Capillary: 112 mg/dL — ABNORMAL HIGH (ref 65–99)
Glucose-Capillary: 98 mg/dL (ref 65–99)

## 2016-09-21 LAB — TSH: TSH: 0.544 u[IU]/mL (ref 0.350–4.500)

## 2016-09-21 LAB — HEPARIN LEVEL (UNFRACTIONATED)
Heparin Unfractionated: 0.43 IU/mL (ref 0.30–0.70)
Heparin Unfractionated: 0.1 IU/mL — ABNORMAL LOW (ref 0.30–0.70)

## 2016-09-21 LAB — TROPONIN I

## 2016-09-21 MED ORDER — POTASSIUM CHLORIDE ER 10 MEQ PO TBCR: 20.0000 meq | EXTENDED_RELEASE_TABLET | Freq: Every day | ORAL | 11 refills | Status: DC

## 2016-09-21 NOTE — Progress Notes (Signed)
ANTICOAGULATION CONSULT NOTE - Follow Up Consult  Pharmacy Consult for heparin Indication: chest pain/ACS  Labs:  Recent Labs  09/20/16 1219 09/20/16 2135 09/21/16 0324  HGB 12.4  --  11.4*  HCT 37.5  --  34.5*  PLT 488*  --  421*  HEPARINUNFRC  --   --  0.43  CREATININE 0.77  --   --   TROPONINI  --  <0.03  --     Assessment/Plan:  56yo female therapeutic on heparin with initial dosing for CP though bolus and drip started late so level drawn just a few hours after bolus. Will continue gtt at current rate and check additional level.   Wynona Neat, PharmD, BCPS  09/21/2016,4:05 AM

## 2016-09-21 NOTE — Progress Notes (Signed)
Pt has orders to be discharged. Discharge instructions given and pt has no additional questions at this time. Medication regimen reviewed and pt educated. Pt verbalized understanding and has no additional questions. Telemetry box removed. IV removed and site in good condition. Pt stable and waiting for transportation. 

## 2016-09-21 NOTE — Discharge Summary (Signed)
Discharge Summary    Patient ID: Lindsay Rivera,  MRN: [id number], DOB/AGE: 08/15/1960 56 y.o.  Admit date: 09/20/2016 Discharge date: 09/21/2016  Primary Care Provider: System, Pcp Not In Primary Cardiologist: Nahser  Discharge Diagnoses    Active Problems:   Unstable angina [facility])   Coronary artery disease involving native coronary artery of native heart with angina pectoris (HCC)   Allergies Allergies  Allergen Reactions  . Penicillins Swelling    Has patient had a PCN reaction causing immediate rash, facial/tongue/throat swelling, SOB or lightheadedness with hypotension: No Has patient had a PCN reaction causing severe rash involving mucus membranes or skin necrosis: No Has patient had a PCN reaction that required hospitalization No Has patient had a PCN reaction occurring within the last 10 years: No If all of the above answers are "NO", then may proceed with Cephalosporin use.   . Latex Rash    Diagnostic Studies/Procedures    N/a _____________   History of Present Illness     56 y.o. divorced African-American female mother of 2 living boys with a past medical history significant for coronary artery disease status post myocardial infarctions 69 in [place]. She had coronary bypass grafting 3 at that time. She had cardiac catheterization performed by Dr. Claiborne Billings 04/17/15 notable for an atretic LIMA to the LAD with a high-grade ostial LAD stenosis which underwent stenting. The vein to the diagonal branch was patent as was the vein to the distal RCA. She had normal LV function. Her other problems include hypertension, hyperlipidemia, bipolar disorder with depression. She sees Dr. Acie Fredrickson on a semiannual basis. Her last office visit was 12/23/15. She does get substernal pain related to her bypass as well as exertional angina and angina on an infrequent basis. She developed chest pain late the morning of admission with left arm pain and tingling. She said this was  reminiscent of her pain in 2015 when she had her myocardial function. Resolved almost completely with one sublingual nitroglycerin. She was pain-free at the time of assessment.   Hospital Course     She was admitted overnight for observation, placed on heparin, enzymes cycled and negative. She had no further episodes of chest pain throughout the evening. EKG the following morning with no changes. Had mild hypokalemia that was corrected with oral supplement. Her home potassium was increased from 72meq daily to 49meq daily at the time of discharge.   She was seen by Dr. Gwenlyn Found and determined stable for discharge home. Follow up in the office has been arranged. Medications are listed below.   General: Well developed, well nourished, female appearing in no acute distress. Head: Normocephalic, atraumatic.  Neck: Supple without bruits, JVD. Lungs:  Resp regular and unlabored, CTA. Heart: RRR, S1, S2, no S3, S4, or murmur; no rub. Abdomen: Soft, non-tender, non-distended with normoactive bowel sounds. No hepatomegaly. No rebound/guarding. No obvious abdominal masses. Extremities: No clubbing, cyanosis, edema. Distal pedal pulses are 2+ bilaterally. Neuro: Alert and oriented X 3. Moves all extremities spontaneously. Psych: Normal affect.  _____________  Discharge Vitals Blood pressure 93/70, pulse 97, temperature 98 F (36.7 C), temperature source Oral, resp. rate 20, height 5\' 2"  (1.575 m), weight 161 lb 8 oz (73.3 kg), SpO2 100 %.  Filed Weights   09/20/16 1224 09/20/16 2111 09/21/16 0700  Weight: 166 lb (75.3 kg) 162 lb (73.5 kg) 161 lb 8 oz (73.3 kg)    Labs & Radiologic Studies    CBC  Recent Labs  09/20/16  1219 09/21/16 0324  WBC 9.3 9.0  HGB 12.4 11.4*  HCT 37.5 34.5*  MCV 81.0 81.2  PLT 488* 161*   Basic Metabolic Panel  Recent Labs  09/20/16 1219 09/21/16 0324  NA 139 142  K 3.0* 3.2*  CL 102 103  CO2 29 30  GLUCOSE 112* 126*  BUN 8 7  CREATININE 0.77 0.83    CALCIUM 9.4 9.2   Liver Function Tests No results for input(s): AST, ALT, ALKPHOS, BILITOT, PROT, ALBUMIN in the last 72 hours. No results for input(s): LIPASE, AMYLASE in the last 72 hours. Cardiac Enzymes  Recent Labs  09/20/16 2135 09/21/16 0324 09/21/16 0928  TROPONINI <0.03 <0.03 <0.03   BNP Invalid input(s): POCBNP D-Dimer No results for input(s): DDIMER in the last 72 hours. Hemoglobin A1C No results for input(s): HGBA1C in the last 72 hours. Fasting Lipid Panel  Recent Labs  09/21/16 0324  CHOL 137  HDL 45  LDLCALC 59  TRIG 166*  CHOLHDL 3.0   Thyroid Function Tests  Recent Labs  09/21/16 0928  TSH 0.544   _____________  Dg Chest 2 View  Result Date: 09/20/2016 CLINICAL DATA:  Chest and left 5 pain this morning. EXAM: CHEST  2 VIEW COMPARISON:  11/13/2015 FINDINGS: Stable surgical changes with median sternotomy wires. The heart is normal in size. The mediastinal and hilar contours are normal in stable. The lungs are clear. No pleural effusion. The bony thorax is intact. IMPRESSION: No acute cardiopulmonary findings. Electronically Signed   By: Marijo Sanes M.D.   On: 09/20/2016 13:23   Disposition   Pt is being discharged home today in good condition.  Follow-up Plans & Appointments    Follow-up Information    [place], Crista Luria, [place] Follow up on 10/03/2016.   Specialty:  Cardiology Why:  at 2pm for your follow up appt.  Contact information: [address] [place] [address] [phone]          Discharge Instructions    Diet - low sodium heart healthy    Complete by:  As directed    Increase activity slowly    Complete by:  As directed       Discharge Medications   Current Discharge Medication List    CONTINUE these medications which have CHANGED   Details  potassium chloride (K-DUR) 10 MEQ tablet Take 2 tablets (20 mEq total) by mouth daily. Qty: 30 tablet, Refills: 11      CONTINUE these medications which have  NOT CHANGED   Details  acyclovir (ZOVIRAX) 400 MG tablet Take 2 tablets (800 mg total) by mouth 2 (two) times daily. For 5 days Qty: 20 tablet, Refills: 5   Associated Diagnoses: Blister    amLODipine (NORVASC) 5 MG tablet Take 1 tablet (5 mg total) by mouth daily. Qty: 90 tablet, Refills: 3   Associated Diagnoses: Essential hypertension; Coronary artery disease involving coronary bypass graft of native heart with unstable angina pectoris (HCC)    aspirin EC 81 MG tablet Take 1 tablet (81 mg total) by mouth daily.    atorvastatin (LIPITOR) 80 MG tablet TAKE 1 TABLET BY MOUTH AT BEDTIME Qty: 30 tablet, Refills: 7    buPROPion (WELLBUTRIN XL) 150 MG 24 hr tablet Take 1 tablet (150 mg total) by mouth every evening. Qty: 30 tablet, Refills: 0    Calcium Carbonate-Vitamin D 600-400 MG-UNIT per chew tablet Chew 1 tablet by mouth daily. Reported on 03/25/2015    cetirizine (ZYRTEC) 10 MG tablet  Take 1 tablet (10 mg total) by mouth daily. Qty: 30 tablet, Refills: 11   Associated Diagnoses: Pityriasis rosea; Rash    clopidogrel (PLAVIX) 75 MG tablet Take 1 tablet (75 mg total) by mouth daily. Take 4 tablets the first day then 1 tablet daily thereafter Qty: 94 tablet, Refills: 3    clotrimazole (LOTRIMIN) 1 % cream Apply 1 application topically 2 (two) times daily as needed. Reported on 07/09/2015 Qty: 60 g, Refills: 0    diazepam (VALIUM) 5 MG tablet Take 5 mg by mouth 2 (two) times daily as needed for anxiety. Reported on 07/09/2015    famotidine (PEPCID) 20 MG tablet One at bedtime Qty: 30 tablet, Refills: 2    fluticasone (FLONASE) 50 MCG/ACT nasal spray Place 2 sprays into both nostrils daily. Qty: 16 g, Refills: 3   Associated Diagnoses: Nasal congestion    hydrochlorothiazide (HYDRODIURIL) 25 MG tablet TAKE 1 TABLET BY MOUTH DAILY Qty: 30 tablet, Refills: 6    hydrOXYzine (ATARAX/VISTARIL) 25 MG tablet TAKE 1 TABLET BY MOUTH AT BEDTIME. Qty: 30 tablet, Refills: 0   Associated  Diagnoses: Itching    ketoconazole (NIZORAL) 2 % cream Apply 1 application topically 2 (two) times daily. To rash under arms and upper thighs twice daily for 14 days Qty: 60 g, Refills: 2   Associated Diagnoses: Rash    levothyroxine (SYNTHROID, LEVOTHROID) 125 MCG tablet Take 1 tablet (125 mcg total) by mouth daily. Qty: 90 tablet, Refills: 3   Associated Diagnoses: Other specified hypothyroidism    lidocaine (LIDODERM) 5 % Place 1 patch onto the skin daily as needed (pain). Remove & Discard patch within 12 hours or as directed by MD    metFORMIN (GLUCOPHAGE XR) 500 MG 24 hr tablet Take 1 tablet (500 mg total) by mouth daily with breakfast. Qty: 30 tablet, Refills: 5   Associated Diagnoses: Diabetes mellitus due to underlying condition without complication, without long-term current use of insulin (HCC)    methocarbamol (ROBAXIN) 500 MG tablet Can take up to 1-2 tabs every 6 hours PRN PAIN Qty: 20 tablet, Refills: 0    NITROSTAT 0.4 MG SL tablet PLACE 1 TABLET UNDER THE TONGUE EVERY 5 MINS AS NEEDED FOR CHEST PAIN Qty: 25 tablet, Refills: 0    OLANZapine (ZYPREXA) 20 MG tablet Take 20 mg by mouth at bedtime.    pantoprazole (PROTONIX) 40 MG tablet Take 1 tablet (40 mg total) by mouth 2 (two) times daily. Qty: 60 tablet, Refills: 5    PARoxetine (PAXIL) 10 MG tablet Take 1 tablet (10 mg total) by mouth daily. Qty: 30 tablet, Refills: 5   Associated Diagnoses: Hot flashes    polyethylene glycol powder (MIRALAX) powder Take 17 g by mouth daily. Constipation Qty: 850 g, Refills: 0   Associated Diagnoses: Constipation, unspecified constipation type    !! QUEtiapine (SEROQUEL) 400 MG tablet Take 600 mg by mouth at bedtime.    !! QUEtiapine (SEROQUEL) 50 MG tablet Take 50 mg by mouth 2 (two) times daily.    solifenacin (VESICARE) 5 MG tablet Take 1 tablet (5 mg total) by mouth daily. Qty: 30 tablet, Refills: 2   Associated Diagnoses: Urinary incontinence, unspecified type      VENTOLIN HFA 108 (90 Base) MCG/ACT inhaler INHALE 2 PUFFS INTO THE LUNGS EVERY 6 HOURS AS NEEDED FOR WHEEZING. Qty: 1 Inhaler, Refills: 1     !! - Potential duplicate medications found. Please discuss with provider.        Outstanding Labs/Studies  BMET at follow up appt.   Duration of Discharge Encounter   Greater than 30 minutes including physician time.  Signed, Reino Bellis NP-C 09/21/2016, 11:09 AM

## 2016-09-21 NOTE — Progress Notes (Signed)
Patient is alert and oriented x 4. VSS. No complaints of chest pain. No skin breakdown. SR on telemetry. General plan of care initiated. Patient oriented to room and call system.

## 2016-09-21 NOTE — Care Management Note (Signed)
Case Management Note  Patient Details  Name: Lindsay Rivera MRN: [id number] Date of Birth: [birth year]  Subjective/Objective:     Unstable angina               Action/Plan: Discharge Planning: AVS reviewed Chart reviewed. Attempted to speak with pt but she was asleep. Pt goes to the [facility] and info on dc instruction to call to arrange follow up appt. Pt can pick up her RX from the [facility] pharmacy.    Expected Discharge Date:  09/21/16               Expected Discharge Plan:  Home/Self Care  In-House Referral:  NA  Discharge planning Services  CM Consult  Post Acute Care Choice:  NA Choice offered to:  NA  DME Arranged:  N/A DME Agency:  NA  HH Arranged:  NA HH Agency:  NA  Status of Service:  Completed, signed off  If discussed at [place] of Stay Meetings, dates discussed:    Additional Comments:  Erenest Rasher, RN 09/21/2016, 11:38 AM

## 2016-10-03 ENCOUNTER — Ambulatory Visit: Payer: Self-pay | Admitting: Physician Assistant

## 2016-10-17 MED FILL — ?PANTOPRAZOLE SOD DR 40MG: 40 MG | 30 days supply | Qty: 60 | Fill #5

## 2016-10-17 MED FILL — KETOCONAZOLE 2% CREAM: 2 | 14 days supply | Qty: 60 | Fill #1 | Status: TO

## 2016-10-17 MED FILL — ATORVASTATIN 80 MG TABLET: 80 | 30 days supply | Qty: 30 | Fill #3

## 2016-10-17 MED FILL — ?LEVOTHYROXINE 125 MCG TABL: 125 | 30 days supply | Qty: 30 | Fill #10

## 2016-10-18 ENCOUNTER — Encounter: Payer: Self-pay | Admitting: Physician Assistant

## 2016-10-18 ENCOUNTER — Ambulatory Visit (INDEPENDENT_AMBULATORY_CARE_PROVIDER_SITE_OTHER): Payer: Self-pay | Admitting: Physician Assistant

## 2016-10-18 VITALS — BP 130/77 | HR 73 | Ht 62.0 in | Wt 156.0 lb

## 2016-10-18 DIAGNOSIS — I25119 Atherosclerotic heart disease of native coronary artery with unspecified angina pectoris: Secondary | ICD-10-CM

## 2016-10-18 DIAGNOSIS — E039 Hypothyroidism, unspecified: Secondary | ICD-10-CM

## 2016-10-18 DIAGNOSIS — E876 Hypokalemia: Secondary | ICD-10-CM

## 2016-10-18 DIAGNOSIS — I10 Essential (primary) hypertension: Secondary | ICD-10-CM

## 2016-10-18 DIAGNOSIS — I25700 Atherosclerosis of coronary artery bypass graft(s), unspecified, with unstable angina pectoris: Secondary | ICD-10-CM

## 2016-10-18 DIAGNOSIS — R002 Palpitations: Secondary | ICD-10-CM

## 2016-10-18 DIAGNOSIS — E785 Hyperlipidemia, unspecified: Secondary | ICD-10-CM

## 2016-10-18 MED ORDER — CARVEDILOL 3.125 MG PO TABS: 3.1250 mg | ORAL_TABLET | Freq: Two times a day (BID) | ORAL | 3 refills | Status: DC

## 2016-10-18 MED ORDER — ATORVASTATIN CALCIUM 80 MG PO TABS: 80.0000 mg | ORAL_TABLET | Freq: Every day | ORAL | 7 refills | Status: DC

## 2016-10-18 MED ORDER — AMLODIPINE BESYLATE 5 MG PO TABS: 5.0000 mg | ORAL_TABLET | Freq: Every day | ORAL | 3 refills | Status: DC

## 2016-10-18 MED FILL — AMLODIPINE BESYLATE 5 MG TA: 5 | 30 days supply | Qty: 30 | Fill #0

## 2016-10-18 MED FILL — ?CARVEDILOL 3.125 MG TABLET: 3.125 | 30 days supply | Qty: 60 | Fill #0
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
## 2016-10-18 NOTE — Patient Instructions (Signed)
Medication Instructions:   START TAKING COREG 3.125 MG TWICE A DAY    If you need a refill on your cardiac medications before your next appointment, please call your pharmacy.  Labwork: BMET  AND TSH TODAY    Testing/Procedures: Your physician has recommended that you wear an event monitor. Event monitors are medical devices that record the heart's electrical activity. Doctors most often [place] these monitors to diagnose arrhythmias. Arrhythmias are problems with the speed or rhythm of the heartbeat. The monitor is a small, portable device. You can wear one while you do your normal daily activities. This is usually used to diagnose what is causing palpitations/syncope (passing out).    Follow-Up:  IN 2 TO 3 MONTHS WITH DR Acie Fredrickson    Any Other Special Instructions Will Be Listed Below (If Applicable).

## 2016-10-19 ENCOUNTER — Telehealth: Payer: Self-pay | Admitting: *Deleted

## 2016-10-19 DIAGNOSIS — Z79899 Other long term (current) drug therapy: Secondary | ICD-10-CM

## 2016-10-19 LAB — BASIC METABOLIC PANEL
BUN/Creatinine Ratio: 13 (ref 9–23)
BUN: 11 mg/dL (ref 6–24)
CALCIUM: 9.8 mg/dL (ref 8.7–10.2)
CHLORIDE: 96 mmol/L (ref 96–106)
CO2: 26 mmol/L (ref 20–29)
Creatinine, Ser: 0.82 mg/dL (ref 0.57–1.00)
GFR calc non Af Amer: 80 mL/min/{1.73_m2} (ref >59)
GFR, EST AFRICAN AMERICAN: 93 mL/min/{1.73_m2} (ref >59)
GLUCOSE: 91 mg/dL (ref 65–99)
POTASSIUM: 2.8 mmol/L — AB (ref 3.5–5.2)
Sodium: 144 mmol/L (ref 134–144)

## 2016-10-19 LAB — TSH: TSH: 0.485 u[IU]/mL (ref 0.450–4.500)

## 2016-10-19 MED ORDER — POTASSIUM CHLORIDE ER 10 MEQ PO TBCR: 20.0000 meq | EXTENDED_RELEASE_TABLET | Freq: Every day | ORAL | 11 refills | Status: DC

## 2016-10-19 MED FILL — POTASSIUM CL 10 MEQ TAB SA: 10 | 30 days supply | Qty: 60 | Fill #0

## 2016-10-19 NOTE — Telephone Encounter (Signed)
-----   Message from [place], [place] sent at 10/19/2016  2:03 PM EDT ----- Normal thyroid function test. Potassium is still low. Currently she is taking Kdur 32meq daily. Increase to 40 meq BID for x 3 days and eat food rich in potassium then back to daily dose. Recheck BMET Tuesday.

## 2016-10-25 ENCOUNTER — Other Ambulatory Visit: Payer: Self-pay

## 2016-10-25 DIAGNOSIS — Z79899 Other long term (current) drug therapy: Secondary | ICD-10-CM

## 2016-10-26 LAB — BASIC METABOLIC PANEL
BUN/Creatinine Ratio: 10 (ref 9–23)
BUN: 9 mg/dL (ref 6–24)
CALCIUM: 9.6 mg/dL (ref 8.7–10.2)
CHLORIDE: 105 mmol/L (ref 96–106)
CO2: 22 mmol/L (ref 20–29)
Creatinine, Ser: 0.9 mg/dL (ref 0.57–1.00)
GFR calc non Af Amer: 72 mL/min/{1.73_m2} (ref >59)
GFR, EST AFRICAN AMERICAN: 83 mL/min/{1.73_m2} (ref >59)
GLUCOSE: 99 mg/dL (ref 65–99)
POTASSIUM: 3.8 mmol/L (ref 3.5–5.2)
Sodium: 142 mmol/L (ref 134–144)

## 2016-11-09 MED FILL — OLANZapine 15 MG TABS: 15 | 30 days supply | Qty: 60 | Fill #0

## 2016-11-09 MED FILL — FLUoxetine HCL 20 MG CAPS: 20 | 30 days supply | Qty: 90 | Fill #0

## 2016-11-09 MED FILL — HYDROXYZINE PAM 50 MG CAP: 50 | 30 days supply | Qty: 60 | Fill #0

## 2016-11-09 MED FILL — PROPRANOLOL 20 MG TABLET: 20 | 30 days supply | Qty: 60 | Fill #0

## 2016-11-11 MED FILL — BUPROPION SR 150 MG TABLET: 150 | 30 days supply | Qty: 60 | Fill #0

## 2016-11-14 ENCOUNTER — Other Ambulatory Visit: Payer: Self-pay | Admitting: Family Medicine

## 2016-11-14 DIAGNOSIS — E038 Other specified hypothyroidism: Secondary | ICD-10-CM

## 2016-11-14 MED FILL — HYDROCHLOROTHIAZIDE 25 MG T: 25 | 30 days supply | Qty: 30 | Fill #2

## 2016-11-14 MED FILL — AMLODIPINE BESYLATE 5 MG TA: 5 | 30 days supply | Qty: 30 | Fill #1

## 2016-11-14 MED FILL — ATORVASTATIN 80 MG TABLET: 80 | 30 days supply | Qty: 30 | Fill #4

## 2016-11-14 MED FILL — ?POTASSIUM CL ER 10MEQ TAB: 10 | 30 days supply | Qty: 60 | Fill #1

## 2016-11-14 MED FILL — ?CARVEDILOL 3.125 MG TABLET: 3.125 | 30 days supply | Qty: 60 | Fill #1

## 2016-11-16 MED FILL — ?LEVOTHYROXINE 125 MCG TABL: 125 | 30 days supply | Qty: 30 | Fill #0

## 2017-01-17 ENCOUNTER — Ambulatory Visit: Payer: Self-pay | Admitting: Cardiovascular Disease

## 2017-01-20 ENCOUNTER — Encounter: Payer: Self-pay | Admitting: *Deleted

## 2017-01-23 MED FILL — HYDROXYZINE PAM 50 MG CAP: 50 | 30 days supply | Qty: 60 | Fill #0

## 2017-01-23 MED FILL — LEVOTHYROXINE 125 MCG TAB: 125 | 30 days supply | Qty: 30 | Fill #1 | Status: TO

## 2017-01-23 MED FILL — ?CARVEDILOL 3.125 MG TABLET: 3.125 | 30 days supply | Qty: 60 | Fill #2 | Status: TO

## 2017-01-23 MED FILL — AMLODIPINE BESYLATE 5 MG TA: 5 | 30 days supply | Qty: 30 | Fill #2 | Status: TO

## 2017-01-23 MED FILL — ?POTASSIUM CL ER 10MEQ TAB: 10 | 30 days supply | Qty: 60 | Fill #2 | Status: TO

## 2017-01-23 MED FILL — FLUoxetine HCL 20 MG CAPS: 20 | 30 days supply | Qty: 90 | Fill #0

## 2017-01-23 MED FILL — HYDROCHLOROTHIAZIDE 25 MG T: 25 | 30 days supply | Qty: 30 | Fill #3 | Status: TO

## 2017-01-23 MED FILL — ATORVASTATIN 80 MG TABLET: 80 | 30 days supply | Qty: 30 | Fill #5

## 2017-01-24 MED FILL — ACYCLOVIR 400 MG TABLET: 400 | 5 days supply | Qty: 20 | Fill #0 | Status: TO

## 2017-01-24 MED FILL — PROCTOZONE-HC 2.5 % CREA: 2.5 | 15 days supply | Qty: 30 | Fill #0 | Status: TO

## 2017-01-26 ENCOUNTER — Encounter: Payer: Self-pay | Admitting: Physician Assistant

## 2017-01-26 ENCOUNTER — Ambulatory Visit (INDEPENDENT_AMBULATORY_CARE_PROVIDER_SITE_OTHER): Payer: Self-pay | Admitting: Physician Assistant

## 2017-01-26 VITALS — BP 108/74 | HR 61 | Resp 16 | Ht 62.0 in | Wt 148.8 lb

## 2017-01-26 DIAGNOSIS — I25119 Atherosclerotic heart disease of native coronary artery with unspecified angina pectoris: Secondary | ICD-10-CM

## 2017-01-26 DIAGNOSIS — I10 Essential (primary) hypertension: Secondary | ICD-10-CM

## 2017-01-26 DIAGNOSIS — I5032 Chronic diastolic (congestive) heart failure: Secondary | ICD-10-CM

## 2017-01-26 DIAGNOSIS — E785 Hyperlipidemia, unspecified: Secondary | ICD-10-CM
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
## 2017-01-26 NOTE — Patient Instructions (Addendum)
Medication Instructions:  Your physician recommends that you continue on your current medications as directed. Please refer to the Current Medication list given to you today.   Labwork: None ordered  Testing/Procedures: None ordered  Follow-Up: Your physician recommends that you schedule a follow-up appointment in: 4 MONTHS WITH DR. NAHSER   Any Other Special Instructions Will Be Listed Below (If Applicable).    If you need a refill on your cardiac medications before your next appointment, please call your pharmacy.   

## 2017-05-05 ENCOUNTER — Other Ambulatory Visit: Payer: Self-pay

## 2017-05-05 DIAGNOSIS — R0981 Nasal congestion: Secondary | ICD-10-CM

## 2017-05-05 MED ORDER — FLUTICASONE PROPIONATE 50 MCG/ACT NA SUSP: 2.0000 | Freq: Every day | NASAL | 3 refills | Status: DC

## 2017-05-05 MED ORDER — PANTOPRAZOLE SODIUM 40 MG PO TBEC: 40.0000 mg | DELAYED_RELEASE_TABLET | Freq: Two times a day (BID) | ORAL | 0 refills | Status: DC

## 2017-06-14 ENCOUNTER — Other Ambulatory Visit: Payer: Self-pay | Admitting: Physician Assistant

## 2017-06-14 DIAGNOSIS — E038 Other specified hypothyroidism: Secondary | ICD-10-CM

## 2017-06-14 MED ORDER — LEVOTHYROXINE SODIUM 125 MCG PO TABS: 125.0000 ug | ORAL_TABLET | Freq: Every day | ORAL | 3 refills | Status: DC

## 2017-06-14 MED FILL — FLUTICASONE PROP 50 MCG SPR: 50 | 30 days supply | Qty: 16 | Fill #0

## 2017-06-14 MED FILL — HYDROCHLOROTHIAZIDE 25 MG T: 25 | 30 days supply | Qty: 30 | Fill #0

## 2017-06-14 MED FILL — ACYCLOVIR 400 MG TABLET: 400 | 10 days supply | Qty: 20 | Fill #0

## 2017-06-14 MED FILL — POTASSIUM CL 10 MEQ TAB SA: 10 | 30 days supply | Qty: 60 | Fill #0

## 2017-06-14 MED FILL — AMLODIPINE BESYLATE 5 MG TA: 5 | 30 days supply | Qty: 30 | Fill #0

## 2017-06-14 MED FILL — PROCTOZONE-HC 2.5 % CREA: 2.5 | 15 days supply | Qty: 30 | Fill #0

## 2017-06-14 MED FILL — CARVEDILOL 3.125 MG TABLET: 3.125 | 30 days supply | Qty: 60 | Fill #0

## 2017-06-14 MED FILL — KETOCONAZOLE 2% CREAM: 2 | 14 days supply | Qty: 60 | Fill #0

## 2017-06-14 MED FILL — PANTOPRAZOLE SOD DR 40 MG T: 40 | 30 days supply | Qty: 60 | Fill #0

## 2017-06-14 MED FILL — LEVOTHYROXINE 125 MCG TAB: 125 | 30 days supply | Qty: 30 | Fill #0

## 2017-06-14 MED FILL — ATORVASTATIN 80 MG TABLET: 80 | 30 days supply | Qty: 30 | Fill #0

## 2017-06-14 MED FILL — OLANZapine 15 MG TABS: 15 | 30 days supply | Qty: 60 | Fill #0

## 2017-07-13 ENCOUNTER — Telehealth: Payer: Self-pay | Admitting: Cardiovascular Disease

## 2017-07-13 ENCOUNTER — Telehealth: Payer: Self-pay

## 2017-07-13 NOTE — Telephone Encounter (Signed)
The call was for patient reporting SOB, cant go up and down steps but this is not new.  Has had since heart attack. Her landlord wants her to move her upstairs to the second floor. Been in apt for 3 years.  Theres a vacant apt upstairs and they want her to move. Uses walker, gets SOB, cant walk up the steps. Manager of the complex said if the doctor or nurse can email a letter stating she has heart condition and is unable to go up and down steps, then they wont pursue moving the patient upstairs.  He doesn't need to go into detail but just saying that she needs to stay on level ground.  The email address is [email or web address] or fax is [phone].  Advised we will call her when we hear back from Dr. Acie Fredrickson. In addtion--she does need follow up in cardiology.  I scheduled her with Daune Perch, NP on 08/16/17.

## 2017-07-13 NOTE — Telephone Encounter (Signed)
Patient says she needs something that says she has to stay on the ground floor because of medical needs and it needs to be emailed to the apartment at [email or web address]. Please fu with patient at [phone]

## 2017-07-13 NOTE — Telephone Encounter (Signed)
Pt calling.   Pt c/o Shortness Of Breath: STAT if SOB developed within the last 24 hours or pt is noticeably SOB on the phone  1. Are you currently SOB (can you hear that pt is SOB on the phone)? ?  2. How long have you been experiencing SOB? 1 month  3. Are you SOB when sitting or when up moving around? Both  4. Are you currently experiencing any other symptoms? Nausea

## 2017-07-14 NOTE — Telephone Encounter (Signed)
Patient called back and I informed her that she could not have a letter until she has been seen.

## 2017-07-14 NOTE — Telephone Encounter (Signed)
Contacted pt to inform her of Dr. Wynetta Emery response pt didn't answer left a detailed vm

## 2017-07-17 NOTE — Telephone Encounter (Signed)
Lindsay Rivera, You have seen this patient more recently than I have .   I do not remember any specifics of her disability. Would you be able to write a letter on her behalf for this appartment issue? Thanks  [employer]

## 2017-07-18 ENCOUNTER — Encounter: Payer: Self-pay | Admitting: Physician Assistant

## 2017-07-18 NOTE — Telephone Encounter (Signed)
I have sent you a letter

## 2017-07-20 ENCOUNTER — Telehealth: Payer: Self-pay | Admitting: Cardiovascular Disease

## 2017-07-20 NOTE — Telephone Encounter (Signed)
Spoke with patient who called Monday to request a letter from our office to her apartment manager regarding her difficulty with climbing stairs. I advised that the letter is ready and the patient proceeded to yell at me at which time I advised her that I cannot help her if she is going to yell. I advised that if the person she spoke with on Monday told her they were putting the letter in the mail to her that it may still be on its way. She states she cannot come to our office to pick up the letter because she lives out of town and does not have transportation. I advised I can send another copy to the letter and also agreed to fax the letter to her apartment manager at [phone]. She verbalized understanding and agreement and thanked me for the call.

## 2017-07-20 NOTE — Telephone Encounter (Signed)
New message    Pt is calling asking for a call back about a letter that she requested on Monday. Please call.

## 2017-07-20 NOTE — Telephone Encounter (Signed)
Fax sent and confirmation received. Placing copy in mail per patient request.

## 2017-08-04 ENCOUNTER — Encounter: Payer: Self-pay | Admitting: Cardiology

## 2017-08-11 ENCOUNTER — Ambulatory Visit: Payer: Self-pay | Admitting: Internal Medicine

## 2017-08-16 ENCOUNTER — Ambulatory Visit: Payer: Self-pay | Admitting: Cardiology

## 2017-08-24 ENCOUNTER — Other Ambulatory Visit: Payer: Self-pay

## 2017-08-24 DIAGNOSIS — L42 Pityriasis rosea: Secondary | ICD-10-CM

## 2017-08-24 DIAGNOSIS — R21 Rash and other nonspecific skin eruption: Secondary | ICD-10-CM

## 2017-08-24 NOTE — Telephone Encounter (Signed)
Pharmacy gave refill requests for the following meds: cetirizine, clopidogrel, ketoconazole cream, proctosol HC, and olanzapine. Left vm letting pt know she needs appt for refills, gave number for [facility] front desk and suggested pt request refills when she schedules appt and provider can decide whether or not to prescribe them prior to her visit.

## 2017-10-12 ENCOUNTER — Other Ambulatory Visit: Payer: Self-pay | Admitting: Physician Assistant

## 2017-10-12 DIAGNOSIS — T148XXA Other injury of unspecified body region, initial encounter: Secondary | ICD-10-CM

## 2017-10-13 ENCOUNTER — Other Ambulatory Visit: Payer: Self-pay | Admitting: Physician Assistant

## 2017-10-13 DIAGNOSIS — T148XXA Other injury of unspecified body region, initial encounter: Secondary | ICD-10-CM

## 2017-10-13 MED FILL — ACYCLOVIR 400 MG TABLET: 400 | 5 days supply | Qty: 20 | Fill #0

## 2017-10-16 ENCOUNTER — Ambulatory Visit: Payer: Medicaid Other | Attending: Family Medicine | Admitting: Family Medicine

## 2017-10-16 ENCOUNTER — Encounter: Payer: Self-pay | Admitting: Family Medicine

## 2017-10-16 ENCOUNTER — Ambulatory Visit: Payer: Self-pay | Attending: Family Medicine | Admitting: Licensed Clinical Social Worker

## 2017-10-16 ENCOUNTER — Other Ambulatory Visit: Payer: Self-pay

## 2017-10-16 VITALS — BP 115/81 | HR 66 | Temp 97.8°F | Resp 18 | Ht 62.0 in | Wt 156.2 lb

## 2017-10-16 DIAGNOSIS — I110 Hypertensive heart disease with heart failure: Secondary | ICD-10-CM | POA: Diagnosis not present

## 2017-10-16 DIAGNOSIS — I25119 Atherosclerotic heart disease of native coronary artery with unspecified angina pectoris: Secondary | ICD-10-CM

## 2017-10-16 DIAGNOSIS — M1990 Unspecified osteoarthritis, unspecified site: Secondary | ICD-10-CM | POA: Diagnosis not present

## 2017-10-16 DIAGNOSIS — Z79899 Other long term (current) drug therapy: Secondary | ICD-10-CM | POA: Diagnosis not present

## 2017-10-16 DIAGNOSIS — Z7901 Long term (current) use of anticoagulants: Secondary | ICD-10-CM | POA: Insufficient documentation

## 2017-10-16 DIAGNOSIS — F4320 Adjustment disorder, unspecified: Secondary | ICD-10-CM

## 2017-10-16 DIAGNOSIS — I509 Heart failure, unspecified: Secondary | ICD-10-CM | POA: Insufficient documentation

## 2017-10-16 DIAGNOSIS — Z833 Family history of diabetes mellitus: Secondary | ICD-10-CM | POA: Insufficient documentation

## 2017-10-16 DIAGNOSIS — R32 Unspecified urinary incontinence: Secondary | ICD-10-CM | POA: Diagnosis not present

## 2017-10-16 DIAGNOSIS — I25700 Atherosclerosis of coronary artery bypass graft(s), unspecified, with unstable angina pectoris: Secondary | ICD-10-CM

## 2017-10-16 DIAGNOSIS — J309 Allergic rhinitis, unspecified: Secondary | ICD-10-CM

## 2017-10-16 DIAGNOSIS — Z23 Encounter for immunization: Secondary | ICD-10-CM

## 2017-10-16 DIAGNOSIS — Z87891 Personal history of nicotine dependence: Secondary | ICD-10-CM | POA: Insufficient documentation

## 2017-10-16 DIAGNOSIS — F4322 Adjustment disorder with anxiety: Secondary | ICD-10-CM | POA: Diagnosis not present

## 2017-10-16 DIAGNOSIS — F4321 Adjustment disorder with depressed mood: Secondary | ICD-10-CM

## 2017-10-16 DIAGNOSIS — Z7902 Long term (current) use of antithrombotics/antiplatelets: Secondary | ICD-10-CM | POA: Diagnosis not present

## 2017-10-16 DIAGNOSIS — Z800 Family history of malignant neoplasm of digestive organs: Secondary | ICD-10-CM | POA: Diagnosis not present

## 2017-10-16 DIAGNOSIS — F319 Bipolar disorder, unspecified: Secondary | ICD-10-CM | POA: Insufficient documentation

## 2017-10-16 DIAGNOSIS — Z8249 Family history of ischemic heart disease and other diseases of the circulatory system: Secondary | ICD-10-CM | POA: Insufficient documentation

## 2017-10-16 DIAGNOSIS — R079 Chest pain, unspecified: Secondary | ICD-10-CM

## 2017-10-16 DIAGNOSIS — K219 Gastro-esophageal reflux disease without esophagitis: Secondary | ICD-10-CM | POA: Insufficient documentation

## 2017-10-16 DIAGNOSIS — E089 Diabetes mellitus due to underlying condition without complications: Secondary | ICD-10-CM

## 2017-10-16 DIAGNOSIS — E039 Hypothyroidism, unspecified: Secondary | ICD-10-CM

## 2017-10-16 DIAGNOSIS — Z7982 Long term (current) use of aspirin: Secondary | ICD-10-CM | POA: Insufficient documentation

## 2017-10-16 DIAGNOSIS — Z7984 Long term (current) use of oral hypoglycemic drugs: Secondary | ICD-10-CM | POA: Insufficient documentation

## 2017-10-16 DIAGNOSIS — Z9889 Other specified postprocedural states: Secondary | ICD-10-CM | POA: Insufficient documentation

## 2017-10-16 DIAGNOSIS — F419 Anxiety disorder, unspecified: Secondary | ICD-10-CM

## 2017-10-16 DIAGNOSIS — E119 Type 2 diabetes mellitus without complications: Secondary | ICD-10-CM | POA: Diagnosis not present

## 2017-10-16 DIAGNOSIS — F332 Major depressive disorder, recurrent severe without psychotic features: Secondary | ICD-10-CM

## 2017-10-16 DIAGNOSIS — I25110 Atherosclerotic heart disease of native coronary artery with unstable angina pectoris: Secondary | ICD-10-CM | POA: Insufficient documentation

## 2017-10-16 DIAGNOSIS — F3130 Bipolar disorder, current episode depressed, mild or moderate severity, unspecified: Secondary | ICD-10-CM

## 2017-10-16 DIAGNOSIS — I10 Essential (primary) hypertension: Secondary | ICD-10-CM

## 2017-10-16 LAB — POCT GLYCOSYLATED HEMOGLOBIN (HGB A1C)
HbA1c POC (<> result, manual entry): 5.9 %
HbA1c, POC (controlled diabetic range): 5.9 % (ref 0.0–7.0)
HbA1c, POC (prediabetic range): 5.9 % (ref 5.7–6.4)
Hemoglobin A1C: 5.9 % — AB (ref 4.0–5.6)

## 2017-10-16 LAB — GLUCOSE, POCT (MANUAL RESULT ENTRY): POC Glucose: 91 mg/dL (ref 70–99)

## 2017-10-16 MED ORDER — CARVEDILOL 3.125 MG PO TABS: 3.1250 mg | ORAL_TABLET | Freq: Two times a day (BID) | ORAL | 3 refills | Status: DC

## 2017-10-16 MED ORDER — ALBUTEROL SULFATE HFA 108 (90 BASE) MCG/ACT IN AERS: INHALATION_SPRAY | RESPIRATORY_TRACT | 2 refills | Status: DC

## 2017-10-16 MED ORDER — TRUEPLUS LANCETS 28G MISC: 3 refills | Status: DC

## 2017-10-16 MED ORDER — TRUE METRIX METER W/DEVICE KIT: PACK | 0 refills | Status: DC

## 2017-10-16 MED ORDER — ASPIRIN EC 81 MG PO TBEC: 81.0000 mg | DELAYED_RELEASE_TABLET | Freq: Every day | ORAL | Status: DC

## 2017-10-16 MED ORDER — NITROGLYCERIN 0.4 MG SL SUBL: SUBLINGUAL_TABLET | SUBLINGUAL | 11 refills | Status: DC

## 2017-10-16 MED ORDER — AMLODIPINE BESYLATE 5 MG PO TABS: 5.0000 mg | ORAL_TABLET | Freq: Every day | ORAL | 3 refills | Status: DC

## 2017-10-16 MED ORDER — ATORVASTATIN CALCIUM 80 MG PO TABS: 80.0000 mg | ORAL_TABLET | Freq: Every day | ORAL | 7 refills | Status: DC

## 2017-10-16 MED ORDER — PANTOPRAZOLE SODIUM 40 MG PO TBEC: 40.0000 mg | DELAYED_RELEASE_TABLET | Freq: Every day | ORAL | 11 refills | Status: DC

## 2017-10-16 MED ORDER — METFORMIN HCL ER 500 MG PO TB24: 500.0000 mg | ORAL_TABLET | Freq: Every day | ORAL | 5 refills | Status: DC

## 2017-10-16 MED ORDER — SOLIFENACIN SUCCINATE 5 MG PO TABS: 5.0000 mg | ORAL_TABLET | Freq: Every day | ORAL | 5 refills | Status: DC

## 2017-10-16 MED ORDER — GLUCOSE BLOOD VI STRP: ORAL_STRIP | 3 refills | Status: DC

## 2017-10-16 MED ORDER — BUPROPION HCL ER (XL) 150 MG PO TB24: 150.0000 mg | ORAL_TABLET | Freq: Every evening | ORAL | 0 refills | Status: DC

## 2017-10-16 MED ORDER — CLOPIDOGREL BISULFATE 75 MG PO TABS: 75.0000 mg | ORAL_TABLET | Freq: Every day | ORAL | 3 refills | Status: DC

## 2017-10-16 MED ORDER — CETIRIZINE HCL 10 MG PO TABS: 10.0000 mg | ORAL_TABLET | Freq: Every day | ORAL | 11 refills | Status: DC

## 2017-10-16 MED FILL — TRUE METRIX TEST STRIP: 30 days supply | Qty: 100 | Fill #0

## 2017-10-16 MED FILL — CARVEDILOL 3.125 MG TABLET: 3.125 | 30 days supply | Qty: 60 | Fill #1

## 2017-10-16 MED FILL — AMLODIPINE BESYLATE 5 MG TA: 5 | 30 days supply | Qty: 30 | Fill #1

## 2017-10-16 MED FILL — TRUEplus LANCETS 28G MISC: 30 days supply | Qty: 100 | Fill #0

## 2017-10-16 MED FILL — NITROSTAT 0.4 MG TABLET SL: 0.4 | 25 days supply | Qty: 25 | Fill #0

## 2017-10-16 MED FILL — BUPROPION HCL XL 150 MG TAB: 150 | 30 days supply | Qty: 30 | Fill #0

## 2017-10-16 MED FILL — METFORMIN HCL ER 500 MG TAB: 500 | 30 days supply | Qty: 30 | Fill #0

## 2017-10-16 MED FILL — !TRUE METRIX BLOOD GLUCOSE: 30 days supply | Qty: 1 | Fill #0

## 2017-10-16 MED FILL — CLOPIDOGREL 75 MG TABLET: 75 | 30 days supply | Qty: 34 | Fill #0

## 2017-10-16 MED FILL — !VENTOLIN HFA INHALER: 108 (90 BAS | 25 days supply | Qty: 18 | Fill #0

## 2017-10-16 MED FILL — PANTOPRAZOLE SOD DR 40 MG T: 40 | 30 days supply | Qty: 30 | Fill #0

## 2017-10-16 MED FILL — ATORVASTATIN 80 MG TABLET: 80 | 30 days supply | Qty: 30 | Fill #1

## 2017-10-16 NOTE — BH Specialist Note (Signed)
Integrated Behavioral Health Initial Visit  MRN: [id number] Name: Lindsay Rivera  Number of [place] Clinician visits:: 1/6 Session Start time: 9:50   Session End time: 10:30 AM Total time: 40 minutes  Type of Service: [place] Interpretor:No. Interpretor Name and Language: N/A   Warm Hand Off Completed.       SUBJECTIVE: Lindsay Rivera is a 57 y.o. female accompanied by self Patient was referred by Dr. Chapman Fitch for depression and anxiety. Patient reports the following symptoms/concerns: feelings of sadness and worry, difficulty sleeping, low energy, mood swings, decreased concentration, withdrawn behavior, and irritability Duration of problem: Ongoing; Severity of problem: severe  OBJECTIVE: Mood: Anxious and Depressed and Affect: Tearful Risk of harm to self or others: No plan to harm self or others Pt scored positive on the PHQ-9; however, denied current SI/HI/AVH. Protective factors were identified and safety plan was discussed. Crisis intervention resources were provided  LIFE CONTEXT: Family and Social: Pt receives limited support. School/Work: Pt was denied disability. She has obtained representation through [employer] Self-Care: Pt has daily scriptures sent to cell phone to assist with coping with stressors  Life Changes: Pt has ongoing medical conditions and reports inability to afford medications. She has an outstanding balance through the [facility] resulting in non-compliance with medication since May 2019. Pt reports that she is also grieving the loss of her "NaNa"  GOALS ADDRESSED: Patient will: 1. Reduce symptoms of: anxiety, depression and stress 2. Increase knowledge and/or ability of: coping skills  3. Demonstrate ability to: Increase healthy adjustment to current life circumstances, Increase adequate support systems for patient/family and Begin healthy grieving over  loss  INTERVENTIONS: Interventions utilized: Solution-Focused Strategies, Supportive Counseling, Psychoeducation and/or Health Education and Link to [employer]  Standardized Assessments completed: GAD-7 and PHQ 2&9 with C-SSRS  ASSESSMENT: Patient currently experiencing depression and anxiety triggered by ongoing medical conditions, financial strain, and the recent loss of a loved one. She reports feelings of sadness and worry, difficulty sleeping, low energy, mood swings, decreased concentration, withdrawn behavior, and irritability. Pt scored positive on the PHQ-9; however, denied current SI/HI/AVH. Protective factors were identified and safety plan was discussed. Crisis intervention resources were provided.   Patient may benefit from psychoeducation, psychotherapy, and medication management. LCSWA educated pt on the correlation between one's physical and mental health, in addition, to how stress can negatively impact both. Healthy coping skills were discussed to assist pt with managing anxiety and depression. LCSWA provided pt with resources on local organizations that provide financial assistance. Pt agreed to schedule appointment for [facility] to initiate psychotherapy and medication management.  PLAN: 1. Follow up with behavioral health clinician on : Pt was encouraged to contact LCSWA if symptoms worsen or fail to improve to schedule behavioral appointments at [facility]. 2. Behavioral recommendations: LCSWA recommends that pt apply healthy coping skills discussed, schedule appointment with [facility], and utilize provided resources.  3. Referral(s): [place] (In Clinic), [place] (LME/Outside Clinic) and Community Resources:  Finances 4. "From scale of 1-10, how likely are you to follow plan?": 9/10  Rebekah Chesterfield, LCSW 10/19/17 8:55 AM
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
## 2017-10-16 NOTE — Progress Notes (Signed)
declinded flu. Pain chest for last 3 days tightness,

## 2017-10-17 ENCOUNTER — Other Ambulatory Visit: Payer: Self-pay | Admitting: Family Medicine

## 2017-10-17 DIAGNOSIS — E038 Other specified hypothyroidism: Secondary | ICD-10-CM

## 2017-10-17 DIAGNOSIS — I25700 Atherosclerosis of coronary artery bypass graft(s), unspecified, with unstable angina pectoris: Secondary | ICD-10-CM

## 2017-10-17 LAB — LIPID PANEL
Chol/HDL Ratio: 3.3 ratio (ref 0.0–4.4)
Cholesterol, Total: 269 mg/dL — ABNORMAL HIGH (ref 100–199)
HDL: 82 mg/dL
LDL Calculated: 169 mg/dL — ABNORMAL HIGH (ref 0–99)
Triglycerides: 90 mg/dL (ref 0–149)
VLDL Cholesterol Cal: 18 mg/dL (ref 5–40)

## 2017-10-17 LAB — CBC WITH DIFFERENTIAL/PLATELET
Basophils Absolute: 0.1 x10E3/uL (ref 0.0–0.2)
Basos: 1 %
EOS (ABSOLUTE): 0.3 x10E3/uL (ref 0.0–0.4)
Eos: 3 %
Hematocrit: 42.6 % (ref 34.0–46.6)
Hemoglobin: 13.9 g/dL (ref 11.1–15.9)
Immature Grans (Abs): 0.1 x10E3/uL (ref 0.0–0.1)
Immature Granulocytes: 1 %
Lymphocytes Absolute: 2.1 x10E3/uL (ref 0.7–3.1)
Lymphs: 21 %
MCH: 29.2 pg (ref 26.6–33.0)
MCHC: 32.6 g/dL (ref 31.5–35.7)
MCV: 90 fL (ref 79–97)
Monocytes Absolute: 0.7 x10E3/uL (ref 0.1–0.9)
Monocytes: 7 %
Neutrophils Absolute: 7 x10E3/uL (ref 1.4–7.0)
Neutrophils: 67 %
Platelets: 296 x10E3/uL (ref 150–450)
RBC: 4.76 x10E6/uL (ref 3.77–5.28)
RDW: 14.7 % (ref 12.3–15.4)
WBC: 10.1 x10E3/uL (ref 3.4–10.8)

## 2017-10-17 LAB — COMPREHENSIVE METABOLIC PANEL WITH GFR
ALT: 27 IU/L (ref 0–32)
AST: 34 IU/L (ref 0–40)
Albumin/Globulin Ratio: 1.8 (ref 1.2–2.2)
Albumin: 5.1 g/dL (ref 3.5–5.5)
Alkaline Phosphatase: 103 IU/L (ref 39–117)
BUN/Creatinine Ratio: 12 (ref 9–23)
BUN: 20 mg/dL (ref 6–24)
Bilirubin Total: 0.6 mg/dL (ref 0.0–1.2)
CO2: 23 mmol/L (ref 20–29)
Calcium: 10 mg/dL (ref 8.7–10.2)
Chloride: 100 mmol/L (ref 96–106)
Creatinine, Ser: 1.61 mg/dL — ABNORMAL HIGH (ref 0.57–1.00)
GFR calc Af Amer: 41 mL/min/1.73 — ABNORMAL LOW
GFR calc non Af Amer: 35 mL/min/1.73 — ABNORMAL LOW
Globulin, Total: 2.9 g/dL (ref 1.5–4.5)
Glucose: 69 mg/dL (ref 65–99)
Potassium: 3.7 mmol/L (ref 3.5–5.2)
Sodium: 143 mmol/L (ref 134–144)
Total Protein: 8 g/dL (ref 6.0–8.5)

## 2017-10-17 LAB — MICROALBUMIN / CREATININE URINE RATIO
Creatinine, Urine: 475.9 mg/dL
Microalb/Creat Ratio: 4.8 mg/g{creat} (ref 0.0–30.0)
Microalbumin, Urine: 22.9 ug/mL

## 2017-10-17 LAB — TSH: TSH: 80.98 u[IU]/mL — ABNORMAL HIGH (ref 0.450–4.500)

## 2017-10-17 MED ORDER — ATORVASTATIN CALCIUM 80 MG PO TABS: 80.0000 mg | ORAL_TABLET | Freq: Every day | ORAL | 11 refills | Status: DC

## 2017-10-17 MED ORDER — LEVOTHYROXINE SODIUM 125 MCG PO TABS: 125.0000 ug | ORAL_TABLET | Freq: Every day | ORAL | 6 refills | Status: DC

## 2017-10-17 MED FILL — LEVOTHYROXINE 125 MCG TAB: 125 | 30 days supply | Qty: 30 | Fill #0

## 2017-10-17 NOTE — Progress Notes (Signed)
Patient with recent labs showing hyperlipidemia with LDL of 169.  Patient also with elevated creatinine.  Patient with elevated TSH and has not been on her thyroid medication.  Patient will have new prescription sent to pharmacy for thyroid medication refill, levothyroxine 125 mcg daily as well as new prescription for Lipitor 20 mg daily.  Patient will be notified of results and medications by CMA.

## 2017-10-20 ENCOUNTER — Telehealth: Payer: Self-pay | Admitting: Family Medicine

## 2017-10-20 NOTE — Telephone Encounter (Signed)
Patient called wanting to get her lab results because she needs to start a medication for her thyroid. Please follow up with patient as soon as you can. Patient will be in town until Monday, however, they need the "OK" for their thyroid medication based on labs today so that they can pick it up monday.

## 2017-10-23 ENCOUNTER — Telehealth: Payer: Self-pay | Admitting: Licensed Clinical Social Worker

## 2017-10-23 MED FILL — ACYCLOVIR 400 MG TABLET: 400 | 5 days supply | Qty: 20 | Fill #1

## 2017-10-23 NOTE — Telephone Encounter (Signed)
LCSWA returned missed call from pt.   Pt stated that she has contacted various community agencies and churches; however, she was denied financial assistance to pay for pharmacy bill due to not being a current [employer] resident. She shared that [facility] will not fill any of her medications until she pays $77 minimum on her overdue balance of over $200.  LCSWA informed pt of a [employer] to Assistance. Pt agreed to having LCSWA mail out the guide to address on file.   Pt states that she has an upcoming appointment with [facility] to inquire about any additional assistance. No additional concerns noted.

## 2017-10-24 ENCOUNTER — Telehealth: Payer: Self-pay

## 2017-10-24 NOTE — Telephone Encounter (Signed)
Patient was called, verified DOB and was given lab results. Patient expressed that she was upset with the cost of her medications when speaking to the pharmacy, and hung up.

## 2017-10-24 NOTE — Telephone Encounter (Signed)
Patient called requesting to speak with the supervisor b/c she stated that she had received a call form her nurse stating that her cholesterol and tyroid are high. Patient stated that she did not have money to pay for her medication. Asked the patient if she had the [facility] and she said "no". I tried to explain to her that the clinic and pharmacy were separate and would have to speak with the pharmacy. Patient then hanged up the phone.

## 2017-10-24 NOTE — Telephone Encounter (Signed)
-----   Message from Antony Blackbird, MD sent at 10/17/2017  4:58 PM EDT ----- Notify patient that urine creatinine to microalbumin ratio appears normal

## 2017-10-31 MED FILL — ACYCLOVIR 400 MG TABLET: 400 | 5 days supply | Qty: 20 | Fill #2

## 2017-11-13 ENCOUNTER — Ambulatory Visit: Payer: Medicaid Other | Attending: Family Medicine | Admitting: Family Medicine

## 2017-11-13 ENCOUNTER — Ambulatory Visit: Payer: Self-pay | Attending: Family Medicine | Admitting: Licensed Clinical Social Worker

## 2017-11-13 ENCOUNTER — Ambulatory Visit: Payer: Medicaid Other

## 2017-11-13 ENCOUNTER — Encounter: Payer: Self-pay | Admitting: Family Medicine

## 2017-11-13 VITALS — BP 170/101 | HR 60 | Temp 98.2°F | Resp 18 | Ht 62.0 in | Wt 166.0 lb

## 2017-11-13 DIAGNOSIS — E1122 Type 2 diabetes mellitus with diabetic chronic kidney disease: Secondary | ICD-10-CM | POA: Diagnosis not present

## 2017-11-13 DIAGNOSIS — I25119 Atherosclerotic heart disease of native coronary artery with unspecified angina pectoris: Secondary | ICD-10-CM | POA: Diagnosis not present

## 2017-11-13 DIAGNOSIS — Z7982 Long term (current) use of aspirin: Secondary | ICD-10-CM | POA: Diagnosis not present

## 2017-11-13 DIAGNOSIS — Z79899 Other long term (current) drug therapy: Secondary | ICD-10-CM | POA: Diagnosis not present

## 2017-11-13 DIAGNOSIS — I252 Old myocardial infarction: Secondary | ICD-10-CM | POA: Diagnosis not present

## 2017-11-13 DIAGNOSIS — E089 Diabetes mellitus due to underlying condition without complications: Secondary | ICD-10-CM

## 2017-11-13 DIAGNOSIS — Z87891 Personal history of nicotine dependence: Secondary | ICD-10-CM | POA: Insufficient documentation

## 2017-11-13 DIAGNOSIS — R159 Full incontinence of feces: Secondary | ICD-10-CM

## 2017-11-13 DIAGNOSIS — F3130 Bipolar disorder, current episode depressed, mild or moderate severity, unspecified: Secondary | ICD-10-CM

## 2017-11-13 DIAGNOSIS — F419 Anxiety disorder, unspecified: Secondary | ICD-10-CM | POA: Insufficient documentation

## 2017-11-13 DIAGNOSIS — Z8249 Family history of ischemic heart disease and other diseases of the circulatory system: Secondary | ICD-10-CM | POA: Insufficient documentation

## 2017-11-13 DIAGNOSIS — Z7984 Long term (current) use of oral hypoglycemic drugs: Secondary | ICD-10-CM | POA: Diagnosis not present

## 2017-11-13 DIAGNOSIS — Z833 Family history of diabetes mellitus: Secondary | ICD-10-CM | POA: Diagnosis not present

## 2017-11-13 DIAGNOSIS — I130 Hypertensive heart and chronic kidney disease with heart failure and stage 1 through stage 4 chronic kidney disease, or unspecified chronic kidney disease: Secondary | ICD-10-CM | POA: Diagnosis present

## 2017-11-13 DIAGNOSIS — N183 Chronic kidney disease, stage 3 unspecified: Secondary | ICD-10-CM

## 2017-11-13 DIAGNOSIS — F319 Bipolar disorder, unspecified: Secondary | ICD-10-CM | POA: Diagnosis not present

## 2017-11-13 DIAGNOSIS — E785 Hyperlipidemia, unspecified: Secondary | ICD-10-CM | POA: Insufficient documentation

## 2017-11-13 DIAGNOSIS — F332 Major depressive disorder, recurrent severe without psychotic features: Secondary | ICD-10-CM

## 2017-11-13 DIAGNOSIS — Z8673 Personal history of transient ischemic attack (TIA), and cerebral infarction without residual deficits: Secondary | ICD-10-CM | POA: Diagnosis not present

## 2017-11-13 DIAGNOSIS — B009 Herpesviral infection, unspecified: Secondary | ICD-10-CM

## 2017-11-13 DIAGNOSIS — K219 Gastro-esophageal reflux disease without esophagitis: Secondary | ICD-10-CM | POA: Insufficient documentation

## 2017-11-13 DIAGNOSIS — I509 Heart failure, unspecified: Secondary | ICD-10-CM | POA: Diagnosis not present

## 2017-11-13 DIAGNOSIS — Z880 Allergy status to penicillin: Secondary | ICD-10-CM | POA: Diagnosis not present

## 2017-11-13 DIAGNOSIS — I10 Essential (primary) hypertension: Secondary | ICD-10-CM

## 2017-11-13 LAB — GLUCOSE, POCT (MANUAL RESULT ENTRY): POC Glucose: 82 mg/dL (ref 70–99)

## 2017-11-13 MED ORDER — VALACYCLOVIR HCL 500 MG PO TABS: 500.0000 mg | ORAL_TABLET | Freq: Two times a day (BID) | ORAL | 11 refills | Status: AC

## 2017-11-13 MED FILL — VALACYCLOVIR HCL 500 MG TAB: 500 | 3 days supply | Qty: 6 | Fill #0

## 2017-11-13 NOTE — Progress Notes (Signed)
Subjective:    Patient ID: Lindsay Rivera, female    DOB: [birth year], 57 y.o.   MRN: [id number]  HPI 57 yo female who is seen in follow-up of recent visit on 10/16/2017 to establish care for the medical management of chronic issues including type 2 diabetes, coronary artery disease status post three-vessel CABG in 2015 as well as stenting in 2017, hyperlipidemia, hypothyroidism and urinary incontinence.  At the time of her visit in September, patient had been out of her medications since May.  Patient's hemoglobin A1c however was very good at 5.9.  Patient was given prescription to restart metformin.  Patient was also provided with glucometer and testing supplies.  Patient was restarted on blood pressure medications and patient was also referred to cardiology in follow-up of episodes of chest pain. Per CMA, patient's concern's today are that the medication that she takes for herpes outbreaks is not effective and that she is concerned/anxious regarding her renal function.  Patient states that she did take her blood pressure medication shortly before coming to today's appointment.  Patient does have some occasional headaches related to her blood pressure.  Patient believes that her blood pressure is elevated because she has been under a lot of stress and patient also states that she has been worried about her kidney function as she had an aunt and another family member who had renal failure.  Patient also states that she has had onset over the past 3 days of an itchy area on her right buttock followed by a small bump.  Patient states that the medication, acyclovir, that she takes for outbreaks does not seem to be working well.  Patient states that she has about 3-4 outbreaks per year.  Patient would like a different medication to take when these outbreaks occur.  Patient states that her past physician would not believe that this outbreak was a herpes type virus but then she saw another provider here who confirmed  that the rash did appear to be a herpes type virus and placed her on the acyclovir.      Patient states that she has also been having some left-sided chest pressure which occurs off and on.  Patient has not needed to use nitroglycerin.  Patient states that she did not make an appointment with her cardiologist as she thought that the appointment would be made through this office. Patient also reports that she has had past issues with constipation and is on Miralax however she does not take this medication daily as it causes her to have a bowel movement too often which causes irritation of her rectal area.  Patient however states that twice this week she has had episodes of leakage of feces without being aware.  Patient states that this happened once last week while she was sitting on her couch and then happened overnight this weekend while she was in bed.  Patient states that she had only taken MiraLAX once earlier this week and then had a normal bowel movement the next day.  Patient denies any current issues with abdominal pain, no blood in the stool and no dark stools. Past Medical History:  Diagnosis Date  . Anemia   . Anxiety   . Arthritis    "lower back" (04/17/2015)  . Asthma   . Bipolar disorder ([place])   . CHF (congestive heart failure) ([place])   . Chronic lower back pain   . Constipation   . CVA (cerebral vascular accident) ([place]) 2010   denies  residual on 04/17/2015  . Depression   . GERD (gastroesophageal reflux disease)   . Hyperlipidemia   . Hypertension   . Hypothyroidism   . MI (myocardial infarction) ([place]) 02/08/2013  . Migraine    "q 3-4 months" (04/17/2015)  . Type II diabetes mellitus ([place])    "diet controlled" (04/17/2015)   Family History  Problem Relation Age of Onset  . Heart disease Father   . Hypertension Father   . Cancer Father        colon cancer   . Colon cancer Father   . Heart attack Father   . Colon cancer Maternal Grandfather   . Diabetes Mother   .  Hypertension Mother   . Stomach cancer Mother    Social History   Tobacco Use  . Smoking status: Former Smoker    Packs/day: 0.12    Years: 38.00    Pack years: 4.56    Types: Cigarettes    Last attempt to quit: 02/08/2013    Years since quitting: 4.7  . Smokeless tobacco: Never Used  Substance Use Topics  . Alcohol use: No    Alcohol/week: 0.0 standard drinks    Comment: 04/17/2015 "I'll have a glass of wine on birthdays, new year's, etc."  . Drug use: Yes    Types: "Crack" cocaine, Marijuana    Comment: "used crack cocaine in the 1980s"   Allergies  Allergen Reactions  . Penicillins Swelling    Has patient had a PCN reaction causing immediate rash, facial/tongue/throat swelling, SOB or lightheadedness with hypotension: No Has patient had a PCN reaction causing severe rash involving mucus membranes or skin necrosis: No Has patient had a PCN reaction that required hospitalization No Has patient had a PCN reaction occurring within the last 10 years: No If all of the above answers are "NO", then may proceed with Cephalosporin use.   . Latex Rash       Review of Systems  Constitutional: Positive for fatigue. Negative for chills and fever.  Respiratory: Negative for cough and shortness of breath.   Cardiovascular: Positive for chest pain (recurrent brief CP not requiring NTG use) and leg swelling (c/o swelling in her hands, legs and feet). Negative for palpitations.  Genitourinary: Negative for dysuria and frequency.  Skin: Positive for rash. Negative for wound.  Neurological: Positive for headaches (occasional dull headaches). Negative for dizziness.  Psychiatric/Behavioral: Positive for dysphoric mood. Negative for suicidal ideas. The patient is nervous/anxious (Reports anxiety after receiving recent abnormal lab results regarding kidney function).        Objective:   Physical Exam BP (!) 170/101 (BP Location: Left Arm, Patient Position: Sitting, Cuff Size: Normal)    Pulse 60   Temp 98.2 F (36.8 C) (Oral)   Resp 18   Ht 5' 2"  (1.575 m)   Wt 166 lb (75.3 kg)   SpO2 100%   BMI 30.36 kg/m  vital signs and nurse's notes reviewed General-well-nourished, well-developed overweight older female in no acute physical distress but patient is tearful Lungs-clear to auscultation bilaterally Cardiovascular-regular rate and rhythm Abdomen- abdomen is distended and slightly firm.  Abdomen is nontender to palpation Back-no CVA tenderness Extremities- no edema of the hands, lower extremities or feet Skin- patient with one papule versus vesicle on the left buttock with erythema at the base and patient does have other areas of hyperpigmented skin consistent with prior rash Psych- patient is tearful but consolable       Assessment & Plan:  1. Diabetes mellitus  due to underlying condition without complication, without long-term current use of insulin ([place]) Patient's hemoglobin A1c at her last visit showed good control of diabetes and her A1c was 5.9.  Patient with restarted on metformin.  Patient denies any GI issues with the use of metformin.  Per protocol, patient with fingerstick glucose today which was 82.  Patient will also have repeat BMP at today's visit as she also had an elevation in serum creatinine at her last visit. - Glucose (CBG) - Basic Metabolic Panel  2. Stage 3 chronic kidney disease ([place]) On patient's blood work done on 10/16/2017, patient had creatinine which was elevated at 1.61.  Patient's prior creatinine had been 0.90 in October 2019.  Patient however had not been on any medications for her blood pressure or diabetes since May of this year.  I discussed with patient at today's visit the importance of making sure that her blood pressure and blood sugars remain well controlled.  Also discussed with the patient that her microalbumin test was normal which normally indicates no significant structural damage to the kidneys.  Discussed with the patient that  just because her relative had renal failure that it does not mean that this will happen to her especially if she make sure to take her medications as prescribed and follow healthy diet so that her blood pressure and blood sugars remain controlled as this can help stop her slow progression of chronic kidney disease.  Patient will have BMP today to recheck renal function. - Basic Metabolic Panel  3. Uncontrolled hypertension Patient is currently on amlodipine 5 mg, labetalol and hydrochlorothiazide for control of blood pressure.  Patient's blood pressure however was very elevated at today's visit.  Patient reports that she did take her blood pressure medications prior to today's visit but patient does have a history of noncompliance with medications and patient was emotionally upset at today's visit which could also contributed to her elevated blood pressure.  We will have patient return to clinic in approximately 2 weeks to meet with the clinical pharmacist regarding her blood pressure.  Patient does have an elevation in her creatinine but no microalbuminuria.  Patient may still be candidate to add ACE inhibitor therapy or may need increase in her dose of amlodipine if her blood pressure remains elevated at her follow-up visit.   4. Coronary artery disease involving native coronary artery of native heart with angina pectoris [facility]) Patient with coronary artery disease status post CABG and stenting.  Patient is being referred back to cardiology.  Since patient is established, she can call to make her own appointment but referral will again be placed so that the referral coordinator from this office can also contact cardiology on behalf of the patient to schedule follow-up - Ambulatory referral to Cardiology  5. HSV (herpes simplex virus) infection Patient with a lesion on the buttock which appears to be consistent with HSV infection.  Will discontinue acyclovir as patient reports that this is not been  effective.  Will place patient on Valtrex 500 mg twice daily x3 days.  Refills provided in case of future outbreaks.  Patient should be able to tolerate this dose for a short duration even with her renal issues but if her creatinine worsens, medication may require adjustment. - valACYclovir (VALTREX) 500 MG tablet; Take 1 tablet (500 mg total) by mouth 2 (two) times daily for 3 days. As needed for acute rash  Dispense: 6 tablet; Refill: 11  6. Full incontinence of feces Patient reports  episodes x2 of fecal incontinence.  Patient will be referred to gastroenterology for further evaluation and treatment.  Also discussed with the patient that if she is still having constipation then sometimes loose stool will leak around the hard stool.  Patient did have abdominal distention and abdomen was slightly firm at today's visit and patient reports that she does not use MiraLAX on a daily basis but does have a long-standing history of constipation. - Ambulatory referral to Gastroenterology  7. Bipolar affective disorder, current episode depressed, current episode severity  unspecified [facility]) Patient with acute anxiety and depression at today's visit.  I did have the social worker consult with the patient today and patient also has mental health follow-up through [facility] and she is encouraged to make them aware of her current issues with increased anxiety and depression.  An After Visit Summary was printed and given to the patient. Allergies as of 11/13/2017      Reactions   Penicillins Swelling   Has patient had a PCN reaction causing immediate rash, facial/tongue/throat swelling, SOB or lightheadedness with hypotension: No Has patient had a PCN reaction causing severe rash involving mucus membranes or skin necrosis: No Has patient had a PCN reaction that required hospitalization No Has patient had a PCN reaction occurring within the last 10 years: No If all of the above answers are "NO", then may proceed with  Cephalosporin use.   Latex Rash      Medication List        Accurate as of 11/13/17 12:11 PM. Always use your most recent med list.          albuterol 108 (90 Base) MCG/ACT inhaler Commonly known as:  PROVENTIL HFA;VENTOLIN HFA INHALE 2 PUFFS INTO THE LUNGS EVERY 6 HOURS AS NEEDED FOR WHEEZING.   amLODipine 5 MG tablet Commonly known as:  NORVASC Take 1 tablet (5 mg total) by mouth daily.   aspirin EC 81 MG tablet Take 1 tablet (81 mg total) by mouth daily.   atorvastatin 80 MG tablet Commonly known as:  LIPITOR Take 1 tablet (80 mg total) by mouth at bedtime.   buPROPion 150 MG 24 hr tablet Commonly known as:  WELLBUTRIN XL Take 1 tablet (150 mg total) by mouth every evening.   Calcium Carbonate-Vitamin D 600-400 MG-UNIT chew tablet Chew 1 tablet by mouth daily. Reported on 03/25/2015   carvedilol 3.125 MG tablet Commonly known as:  COREG Take 1 tablet (3.125 mg total) by mouth 2 (two) times daily.   cetirizine 10 MG tablet Commonly known as:  ZYRTEC Take 1 tablet (10 mg total) by mouth at bedtime. As needed for nasal congestion   clopidogrel 75 MG tablet Commonly known as:  PLAVIX Take 1 tablet (75 mg total) by mouth daily. Take 4 tablets the first day then 1 tablet daily thereafter   clotrimazole 1 % cream Commonly known as:  LOTRIMIN Apply 1 application topically 2 (two) times daily as needed. Reported on 07/09/2015   diazepam 5 MG tablet Commonly known as:  VALIUM Take 5 mg by mouth 2 (two) times daily as needed for anxiety. Reported on 07/09/2015   famotidine 20 MG tablet Commonly known as:  PEPCID Take 20 mg by mouth daily.   fluticasone 50 MCG/ACT nasal spray Commonly known as:  FLONASE Place 2 sprays into both nostrils daily.   glucose blood test strip Use as instructed once per day to check blood sugar   hydrochlorothiazide 25 MG tablet Commonly known as:  HYDRODIURIL Take  25 mg by mouth daily.   hydrOXYzine 25 MG tablet Commonly known as:   ATARAX/VISTARIL TAKE 1 TABLET BY MOUTH AT BEDTIME.   ketoconazole 2 % cream Commonly known as:  NIZORAL Apply 1 application topically 2 (two) times daily. To rash under arms and upper thighs twice daily for 14 days   levothyroxine 125 MCG tablet Commonly known as:  SYNTHROID, LEVOTHROID Take 1 tablet (125 mcg total) by mouth daily.   lidocaine 5 % Commonly known as:  LIDODERM Place 1 patch onto the skin daily as needed (pain). Remove & Discard patch within 12 hours or as directed by MD   metFORMIN 500 MG 24 hr tablet Commonly known as:  GLUCOPHAGE-XR Take 1 tablet (500 mg total) by mouth daily with breakfast.   methocarbamol 500 MG tablet Commonly known as:  ROBAXIN Can take up to 1-2 tabs every 6 hours PRN PAIN   nitroGLYCERIN 0.4 MG SL tablet Commonly known as:  NITROSTAT PLACE 1 TABLET UNDER THE TONGUE EVERY 5 MINS AS NEEDED FOR CHEST PAIN   OLANZapine 20 MG tablet Commonly known as:  ZYPREXA Take 20 mg by mouth at bedtime.   pantoprazole 40 MG tablet Commonly known as:  PROTONIX Take 1 tablet (40 mg total) by mouth daily.   PARoxetine 10 MG tablet Commonly known as:  PAXIL Take 1 tablet (10 mg total) by mouth daily.   polyethylene glycol packet Commonly known as:  MIRALAX / GLYCOLAX Take 17 g by mouth daily as needed.   potassium chloride 10 MEQ tablet Commonly known as:  K-DUR Take 2 tablets (20 mEq total) by mouth daily.   QUEtiapine 400 MG tablet Commonly known as:  SEROQUEL Take 600 mg by mouth at bedtime.   QUEtiapine 50 MG tablet Commonly known as:  SEROQUEL Take 50 mg by mouth 2 (two) times daily.   solifenacin 5 MG tablet Commonly known as:  VESICARE Take 1 tablet (5 mg total) by mouth daily.   TRUE METRIX METER w/Device Kit Use daily to monitor blood sugar   TRUEPLUS LANCETS 28G Misc Use once daily to check blood sugar   valACYclovir 500 MG tablet Commonly known as:  VALTREX Take 1 tablet (500 mg total) by mouth 2 (two) times daily for 3  days. As needed for acute rash      Return in about 2 months (around 01/13/2018) for BP-2 weeks with CPP; 2 months with PCP.

## 2017-11-13 NOTE — Progress Notes (Signed)
Patient complains of medication for outbreak not helping. Patient is worried about kidney function. Patients main complaint is incontinence with two episodes. Patient no longer feels the sensation to move her bowels and has woke up with stool all around her.

## 2017-11-14 NOTE — BH Specialist Note (Signed)
Integrated Behavioral Health Follow Up Visit  MRN: [id number] Name: Lindsay Rivera  Number of [place] Clinician visits: 2/6 Session Start time: 9:25 AM  Session End time: 9:50 AM Total time: 25 minutes  Type of Service: [employer] Interpretor:No. Interpretor Name and Language: N/A  SUBJECTIVE: Lindsay Rivera is a 57 y.o. female accompanied by self Patient was referred by Dr. Chapman Fitch for depression and anxiety. Patient reports the following symptoms/concerns: feelings of sadness and worry, difficulty sleeping, low energy, mood swings, decreased concentration, withdrawn behavior, and irritability Duration of problem: Ongoing; Pt reports diagnosis of Bipolar Disorder and Schizophrenia; Severity of problem: severe  OBJECTIVE: Mood: Anxious, Depressed and Irritable and Affect: Depressed and Tearful Risk of harm to self or others: No plan to harm self or others Pt scored positive on the PHQ-9; however, denied current SI/HI/AVH. Protective factors were identified and safety plan was discussed at previous appointment. She is aware of local crisis intervention resources  LIFE CONTEXT: Family and Social: Pt receives limited support. She has a friend that checked up on her and encouraged her to come to scheduled appointment today.  School/Work: Pt has a pending disability case. She has no income and is applying for financial assistance  Self-Care: Pt agreed to reinitiate medication management through [facility] to assist with the management of her mental health conditions Life Changes: Pt reports fear regarding her ongoing medical conditions and the onset of new symptoms. She was recently informed of elevated levels regarding her kidneys, which triggered pt's grief of her immediate family members who passed from renal disease.  GOALS ADDRESSED: Patient will: 1.  Reduce symptoms of: anxiety and depression  2.  Increase knowledge and/or ability  of: healthy habits and self-management skills  3.  Demonstrate ability to: Increase healthy adjustment to current life circumstances  INTERVENTIONS: Interventions utilized:  Brief CBT and Supportive Counseling Standardized Assessments completed: GAD-7 and PHQ 2&9 with C-SSRS  ASSESSMENT: Patient currently experiencing depression, irritability, and anxiety triggered by ongoing medical conditions, financial strain, and grief. She reports diagnoses of Bipolar Disorder and Schizophrenia that are currently not being treated. She has feelings of sadness and worry, difficulty sleeping, low energy, mood swings, decreased concentration, withdrawn behavior, and irritability. Protective factors were identified and safety plan was discussed at previous appointment. She is aware of local crisis intervention resources   LCSWA provided support and encouragement. Pt's feelings were validated. LCSWA assisted pt in identifying catastrophizing thought processes and how they can be harmful to pt's health. Strategies were discussed to decrease the cognitive distortions.   Pt completed financial counseling appointment. She agreed to schedule appointment for [facility] tomorrow to re-initiate psychotherapy and medication management.  PLAN: 1. Follow up with behavioral health clinician on : Pt was encouraged to contact LCSWA if symptoms worsen or fail to improve to schedule behavioral appointments at [facility]. 2. Behavioral recommendations: LCSWA recommends that pt apply healthy coping skills discussed and reinitiate services through [facility].  3. Referral(s): [place] (LME/Outside Clinic) 4. "From scale of 1-10, how likely are you to follow plan?": 10/10  Rebekah Chesterfield, LCSW 11/15/17 8:35 AM

## 2017-11-15 LAB — BASIC METABOLIC PANEL WITH GFR
BUN/Creatinine Ratio: 14 (ref 9–23)
BUN: 14 mg/dL (ref 6–24)
CO2: 20 mmol/L (ref 20–29)
Calcium: 10.4 mg/dL — ABNORMAL HIGH (ref 8.7–10.2)
Chloride: 102 mmol/L (ref 96–106)
Creatinine, Ser: 0.98 mg/dL (ref 0.57–1.00)
GFR calc Af Amer: 74 mL/min/1.73
GFR calc non Af Amer: 64 mL/min/1.73
Glucose: 81 mg/dL (ref 65–99)
Potassium: 4.9 mmol/L (ref 3.5–5.2)
Sodium: 146 mmol/L — ABNORMAL HIGH (ref 134–144)

## 2017-11-17 ENCOUNTER — Telehealth: Payer: Self-pay | Admitting: *Deleted

## 2017-11-17 NOTE — Telephone Encounter (Signed)
-----   Message from Antony Blackbird, MD sent at 11/16/2017  4:20 PM EDT ----- Please notify patient that her creatinine is normal at 0.98 but she has a very mild increase in her sodium and potassium levels

## 2017-11-17 NOTE — Telephone Encounter (Signed)
Medical Assistant left message on patient's home and cell voicemail. Voicemail states to give a call back to [place] with [facility] at [phone]. Patient is aware of kidney function being normal and needing to drink 80-100 oz of water daily. Patient will have potassium and sodium levels rechecked at the next visit.

## 2017-11-27 ENCOUNTER — Encounter (HOSPITAL_COMMUNITY): Payer: Self-pay | Admitting: Emergency Medicine

## 2017-11-27 ENCOUNTER — Ambulatory Visit: Payer: Medicaid Other | Admitting: Physician Assistant

## 2017-11-27 ENCOUNTER — Other Ambulatory Visit: Payer: Self-pay | Admitting: Cardiovascular Disease

## 2017-11-27 ENCOUNTER — Emergency Department (HOSPITAL_COMMUNITY): Payer: Medicaid Other

## 2017-11-27 ENCOUNTER — Ambulatory Visit: Payer: Medicaid Other | Attending: Family Medicine | Admitting: Pharmacist

## 2017-11-27 ENCOUNTER — Other Ambulatory Visit: Payer: Self-pay

## 2017-11-27 ENCOUNTER — Inpatient Hospital Stay (HOSPITAL_COMMUNITY)
Admission: EM | Admit: 2017-11-27 | Discharge: 2017-11-30 | DRG: 287 | Disposition: A | Discharge status: Home / Self Care | Payer: Medicaid Other | Attending: Cardiovascular Disease | Admitting: Cardiovascular Disease

## 2017-11-27 ENCOUNTER — Ambulatory Visit: Payer: Medicaid Other

## 2017-11-27 ENCOUNTER — Telehealth: Payer: Self-pay | Admitting: *Deleted

## 2017-11-27 ENCOUNTER — Other Ambulatory Visit: Payer: Self-pay | Admitting: Family Medicine

## 2017-11-27 VITALS — BP 146/91 | HR 73

## 2017-11-27 DIAGNOSIS — Z9114 Patient's other noncompliance with medication regimen: Secondary | ICD-10-CM | POA: Diagnosis not present

## 2017-11-27 DIAGNOSIS — E089 Diabetes mellitus due to underlying condition without complications: Secondary | ICD-10-CM | POA: Diagnosis present

## 2017-11-27 DIAGNOSIS — Z833 Family history of diabetes mellitus: Secondary | ICD-10-CM

## 2017-11-27 DIAGNOSIS — I2510 Atherosclerotic heart disease of native coronary artery without angina pectoris: Principal | ICD-10-CM | POA: Diagnosis present

## 2017-11-27 DIAGNOSIS — R079 Chest pain, unspecified: Secondary | ICD-10-CM | POA: Diagnosis not present

## 2017-11-27 DIAGNOSIS — K219 Gastro-esophageal reflux disease without esophagitis: Secondary | ICD-10-CM | POA: Diagnosis present

## 2017-11-27 DIAGNOSIS — Z951 Presence of aortocoronary bypass graft: Secondary | ICD-10-CM

## 2017-11-27 DIAGNOSIS — E785 Hyperlipidemia, unspecified: Secondary | ICD-10-CM | POA: Diagnosis present

## 2017-11-27 DIAGNOSIS — F4321 Adjustment disorder with depressed mood: Secondary | ICD-10-CM

## 2017-11-27 DIAGNOSIS — Z90710 Acquired absence of both cervix and uterus: Secondary | ICD-10-CM

## 2017-11-27 DIAGNOSIS — F419 Anxiety disorder, unspecified: Secondary | ICD-10-CM | POA: Diagnosis present

## 2017-11-27 DIAGNOSIS — Z8673 Personal history of transient ischemic attack (TIA), and cerebral infarction without residual deficits: Secondary | ICD-10-CM

## 2017-11-27 DIAGNOSIS — Z8249 Family history of ischemic heart disease and other diseases of the circulatory system: Secondary | ICD-10-CM

## 2017-11-27 DIAGNOSIS — R402142 Coma scale, eyes open, spontaneous, at arrival to emergency department: Secondary | ICD-10-CM | POA: Diagnosis present

## 2017-11-27 DIAGNOSIS — R402362 Coma scale, best motor response, obeys commands, at arrival to emergency department: Secondary | ICD-10-CM | POA: Diagnosis present

## 2017-11-27 DIAGNOSIS — E039 Hypothyroidism, unspecified: Secondary | ICD-10-CM | POA: Diagnosis present

## 2017-11-27 DIAGNOSIS — Z955 Presence of coronary angioplasty implant and graft: Secondary | ICD-10-CM

## 2017-11-27 DIAGNOSIS — E119 Type 2 diabetes mellitus without complications: Secondary | ICD-10-CM | POA: Diagnosis present

## 2017-11-27 DIAGNOSIS — Z7902 Long term (current) use of antithrombotics/antiplatelets: Secondary | ICD-10-CM

## 2017-11-27 DIAGNOSIS — I10 Essential (primary) hypertension: Secondary | ICD-10-CM | POA: Diagnosis present

## 2017-11-27 DIAGNOSIS — I25119 Atherosclerotic heart disease of native coronary artery with unspecified angina pectoris: Secondary | ICD-10-CM

## 2017-11-27 DIAGNOSIS — Z9119 Patient's noncompliance with other medical treatment and regimen: Secondary | ICD-10-CM

## 2017-11-27 DIAGNOSIS — R402252 Coma scale, best verbal response, oriented, at arrival to emergency department: Secondary | ICD-10-CM | POA: Diagnosis present

## 2017-11-27 DIAGNOSIS — F4320 Adjustment disorder, unspecified: Secondary | ICD-10-CM

## 2017-11-27 DIAGNOSIS — M545 Low back pain: Secondary | ICD-10-CM | POA: Diagnosis present

## 2017-11-27 DIAGNOSIS — I110 Hypertensive heart disease with heart failure: Secondary | ICD-10-CM | POA: Diagnosis present

## 2017-11-27 DIAGNOSIS — E876 Hypokalemia: Secondary | ICD-10-CM | POA: Diagnosis present

## 2017-11-27 DIAGNOSIS — I252 Old myocardial infarction: Secondary | ICD-10-CM

## 2017-11-27 DIAGNOSIS — Z79890 Hormone replacement therapy (postmenopausal): Secondary | ICD-10-CM

## 2017-11-27 DIAGNOSIS — G8929 Other chronic pain: Secondary | ICD-10-CM | POA: Diagnosis present

## 2017-11-27 DIAGNOSIS — Z91040 Latex allergy status: Secondary | ICD-10-CM

## 2017-11-27 DIAGNOSIS — Z800 Family history of malignant neoplasm of digestive organs: Secondary | ICD-10-CM

## 2017-11-27 DIAGNOSIS — Z7982 Long term (current) use of aspirin: Secondary | ICD-10-CM

## 2017-11-27 DIAGNOSIS — Z87891 Personal history of nicotine dependence: Secondary | ICD-10-CM

## 2017-11-27 DIAGNOSIS — Z7984 Long term (current) use of oral hypoglycemic drugs: Secondary | ICD-10-CM

## 2017-11-27 DIAGNOSIS — Z880 Allergy status to penicillin: Secondary | ICD-10-CM

## 2017-11-27 DIAGNOSIS — I5032 Chronic diastolic (congestive) heart failure: Secondary | ICD-10-CM | POA: Diagnosis present

## 2017-11-27 DIAGNOSIS — G43909 Migraine, unspecified, not intractable, without status migrainosus: Secondary | ICD-10-CM | POA: Diagnosis present

## 2017-11-27 DIAGNOSIS — F319 Bipolar disorder, unspecified: Secondary | ICD-10-CM | POA: Diagnosis present

## 2017-11-27 DIAGNOSIS — F3130 Bipolar disorder, current episode depressed, mild or moderate severity, unspecified: Secondary | ICD-10-CM

## 2017-11-27 LAB — CREATININE, SERUM
Creatinine, Ser: 0.85 mg/dL (ref 0.44–1.00)
GFR calc Af Amer: >60 mL/min (ref >60)

## 2017-11-27 LAB — BASIC METABOLIC PANEL
Anion gap: 14 (ref 5–15)
BUN: 11 mg/dL (ref 6–20)
CHLORIDE: 105 mmol/L (ref 98–111)
CO2: 20 mmol/L — ABNORMAL LOW (ref 22–32)
CREATININE: 0.8 mg/dL (ref 0.44–1.00)
Calcium: 9.8 mg/dL (ref 8.9–10.3)
GFR calc Af Amer: >60 mL/min (ref >60)
GFR calc non Af Amer: >60 mL/min (ref >60)
Glucose, Bld: 86 mg/dL (ref 70–99)
Potassium: 3.6 mmol/L (ref 3.5–5.1)
SODIUM: 139 mmol/L (ref 135–145)

## 2017-11-27 LAB — CBC
HEMATOCRIT: 53.9 % — AB (ref 36.0–46.0)
HEMATOCRIT: 43.8 % (ref 36.0–46.0)
HEMOGLOBIN: 14 g/dL (ref 12.0–15.0)
Hemoglobin: 15.5 g/dL — ABNORMAL HIGH (ref 12.0–15.0)
MCH: 28.5 pg (ref 26.0–34.0)
MCH: 28.7 pg (ref 26.0–34.0)
MCHC: 28.8 g/dL — ABNORMAL LOW (ref 30.0–36.0)
MCHC: 32 g/dL (ref 30.0–36.0)
MCV: 99.1 fL (ref 80.0–100.0)
MCV: 89.8 fL (ref 80.0–100.0)
Platelets: 288 10*3/uL (ref 150–400)
Platelets: 369 10*3/uL (ref 150–400)
RBC: 5.44 MIL/uL — ABNORMAL HIGH (ref 3.87–5.11)
RBC: 4.88 MIL/uL (ref 3.87–5.11)
RDW: 17.8 % — AB (ref 11.5–15.5)
RDW: 17 % — ABNORMAL HIGH (ref 11.5–15.5)
WBC: 8.2 10*3/uL (ref 4.0–10.5)
WBC: 8.9 10*3/uL (ref 4.0–10.5)
nRBC: 0 % (ref 0.0–0.2)
nRBC: 0 % (ref 0.0–0.2)

## 2017-11-27 LAB — CBG MONITORING, ED: Glucose-Capillary: 85 mg/dL (ref 70–99)

## 2017-11-27 LAB — GLUCOSE, CAPILLARY: GLUCOSE-CAPILLARY: 142 mg/dL — AB (ref 70–99)

## 2017-11-27 LAB — I-STAT TROPONIN, ED: Troponin i, poc: 0.07 ng/mL (ref 0.00–0.08)

## 2017-11-27 LAB — TROPONIN I

## 2017-11-27 LAB — I-STAT BETA HCG BLOOD, ED (MC, WL, AP ONLY): I-stat hCG, quantitative: <5 m[IU]/mL (ref <5)

## 2017-11-27 LAB — TSH: TSH: 37.385 u[IU]/mL — ABNORMAL HIGH (ref 0.350–4.500)

## 2017-11-27 MED ORDER — ASPIRIN EC 81 MG PO TBEC
81.0000 mg | DELAYED_RELEASE_TABLET | Freq: Every day | ORAL | Status: DC
  Administered 2017-11-27 – 2017-11-30 (×4): 81 mg via ORAL
  Filled 2017-11-27 (×4): qty 1

## 2017-11-27 MED ORDER — QUETIAPINE FUMARATE 300 MG PO TABS
600.0000 mg | ORAL_TABLET | Freq: Every day | ORAL | Status: DC
  Administered 2017-11-29: 600 mg via ORAL
  Filled 2017-11-27 (×3): qty 2

## 2017-11-27 MED ORDER — HYDROXYZINE HCL 25 MG PO TABS
25.0000 mg | ORAL_TABLET | Freq: Every day | ORAL | Status: DC
  Administered 2017-11-27 – 2017-11-29 (×3): 25 mg via ORAL
  Filled 2017-11-27 (×3): qty 1

## 2017-11-27 MED ORDER — BUPROPION HCL ER (XL) 150 MG PO TB24
150.0000 mg | ORAL_TABLET | Freq: Every evening | ORAL | Status: DC
  Administered 2017-11-27 – 2017-11-28 (×2): 150 mg via ORAL
  Filled 2017-11-27 (×2): qty 1

## 2017-11-27 MED ORDER — LEVOTHYROXINE SODIUM 25 MCG PO TABS
125.0000 ug | ORAL_TABLET | Freq: Every day | ORAL | Status: DC
  Administered 2017-11-28 – 2017-11-30 (×3): 125 ug via ORAL
  Filled 2017-11-27 (×3): qty 1

## 2017-11-27 MED ORDER — ACETAMINOPHEN 325 MG PO TABS
650.0000 mg | ORAL_TABLET | ORAL | Status: DC | PRN
  Administered 2017-11-27: 650 mg via ORAL
  Filled 2017-11-27 (×2): qty 2

## 2017-11-27 MED ORDER — QUETIAPINE FUMARATE 50 MG PO TABS
50.0000 mg | ORAL_TABLET | Freq: Two times a day (BID) | ORAL | Status: DC
  Administered 2017-11-27: 50 mg via ORAL
  Filled 2017-11-27 (×2): qty 1

## 2017-11-27 MED ORDER — ATORVASTATIN CALCIUM 80 MG PO TABS
80.0000 mg | ORAL_TABLET | Freq: Every day | ORAL | Status: DC
  Administered 2017-11-27 – 2017-11-29 (×3): 80 mg via ORAL
  Filled 2017-11-27 (×3): qty 1

## 2017-11-27 MED ORDER — OLANZAPINE 10 MG PO TABS
20.0000 mg | ORAL_TABLET | Freq: Every day | ORAL | Status: DC
  Administered 2017-11-29: 22:00:00 20 mg via ORAL
  Filled 2017-11-27 (×4): qty 2

## 2017-11-27 MED ORDER — ALBUTEROL SULFATE (2.5 MG/3ML) 0.083% IN NEBU: 3.0000 mL | INHALATION_SOLUTION | RESPIRATORY_TRACT | Status: DC | PRN

## 2017-11-27 MED ORDER — PAROXETINE HCL 10 MG PO TABS
10.0000 mg | ORAL_TABLET | Freq: Every day | ORAL | Status: DC
  Administered 2017-11-27: 10 mg via ORAL
  Filled 2017-11-27 (×2): qty 1

## 2017-11-27 MED ORDER — ONDANSETRON HCL 4 MG/2ML IJ SOLN: 4.0000 mg | Freq: Four times a day (QID) | INTRAMUSCULAR | Status: DC | PRN

## 2017-11-27 MED ORDER — NITROGLYCERIN 0.4 MG SL SUBL
0.4000 mg | SUBLINGUAL_TABLET | SUBLINGUAL | Status: AC | PRN
  Administered 2017-11-27 (×3): 0.4 mg via SUBLINGUAL
  Filled 2017-11-27: qty 1

## 2017-11-27 MED ORDER — HEPARIN SODIUM (PORCINE) 5000 UNIT/ML IJ SOLN
5000.0000 [IU] | Freq: Three times a day (TID) | INTRAMUSCULAR | Status: DC
  Administered 2017-11-27 – 2017-11-29 (×6): 5000 [IU] via SUBCUTANEOUS
  Filled 2017-11-27 (×6): qty 1

## 2017-11-27 MED ORDER — ASPIRIN 81 MG PO CHEW
324.0000 mg | CHEWABLE_TABLET | Freq: Once | ORAL | Status: AC
  Administered 2017-11-27: 324 mg via ORAL

## 2017-11-27 MED ORDER — CARVEDILOL 3.125 MG PO TABS
6.2500 mg | ORAL_TABLET | Freq: Two times a day (BID) | ORAL | Status: DC
  Administered 2017-11-28 – 2017-11-30 (×5): 6.25 mg via ORAL
  Filled 2017-11-27: qty 2
  Filled 2017-11-27: qty 1
  Filled 2017-11-27: qty 2
  Filled 2017-11-27 (×3): qty 1

## 2017-11-27 MED ORDER — AMLODIPINE BESYLATE 5 MG PO TABS
5.0000 mg | ORAL_TABLET | Freq: Every day | ORAL | Status: DC
  Administered 2017-11-27 – 2017-11-30 (×4): 5 mg via ORAL
  Filled 2017-11-27 (×4): qty 1

## 2017-11-27 MED ORDER — ASPIRIN EC 81 MG PO TBEC: 81.0000 mg | DELAYED_RELEASE_TABLET | Freq: Every day | ORAL | Status: DC

## 2017-11-27 MED ORDER — PANTOPRAZOLE SODIUM 40 MG PO TBEC
40.0000 mg | DELAYED_RELEASE_TABLET | Freq: Every day | ORAL | Status: DC
  Administered 2017-11-27 – 2017-11-30 (×4): 40 mg via ORAL
  Filled 2017-11-27 (×4): qty 1

## 2017-11-27 MED ORDER — DARIFENACIN HYDROBROMIDE ER 7.5 MG PO TB24
7.5000 mg | ORAL_TABLET | Freq: Every day | ORAL | Status: DC
  Administered 2017-11-27 – 2017-11-30 (×4): 7.5 mg via ORAL
  Filled 2017-11-27 (×4): qty 1

## 2017-11-27 MED ORDER — NITROGLYCERIN 0.4 MG SL SUBL
0.4000 mg | SUBLINGUAL_TABLET | SUBLINGUAL | Status: DC | PRN
  Administered 2017-11-27 – 2017-11-29 (×3): 0.4 mg via SUBLINGUAL
  Filled 2017-11-27 (×3): qty 1

## 2017-11-27 MED FILL — PANTOPRAZOLE SOD DR 40 MG T: 40 | 30 days supply | Qty: 30 | Fill #1

## 2017-11-27 MED FILL — METFORMIN HCL ER 500 MG TAB: 500 | 30 days supply | Qty: 30 | Fill #1

## 2017-11-27 MED FILL — NITROSTAT 0.4 MG TABLET SL: 0.4 | 25 days supply | Qty: 25 | Fill #1

## 2017-11-27 MED FILL — AMLODIPINE BESYLATE 5 MG TA: 5 | 90 days supply | Qty: 90 | Fill #0

## 2017-11-27 MED FILL — CLOPIDOGREL 75 MG TABLET: 75 | 30 days supply | Qty: 30 | Fill #1

## 2017-11-27 MED FILL — POTASSIUM CL ER 10 MEQ TAB: 10 | 30 days supply | Qty: 60 | Fill #1

## 2017-11-27 MED FILL — ACYCLOVIR 400 MG TABLET: 400 | 5 days supply | Qty: 20 | Fill #2

## 2017-11-27 MED FILL — ATORVASTATIN 80 MG TABLET: 80 | 30 days supply | Qty: 30 | Fill #0

## 2017-11-27 MED FILL — CARVEDILOL 3.125 MG TABLET: 3.125 | 30 days supply | Qty: 60 | Fill #2

## 2017-11-27 MED FILL — LEVOTHYROXINE 125 MCG TAB: 125 | 30 days supply | Qty: 30 | Fill #1

## 2017-11-27 MED FILL — ?VALACYCLOVIR HCL 500MG TAB: 500 | 3 days supply | Qty: 6 | Fill #1

## 2017-11-27 MED FILL — BUPROPION HCL XL 150 MG TAB: 150 | 30 days supply | Qty: 30 | Fill #0

## 2017-11-27 NOTE — Progress Notes (Signed)
Chaplain responded to spiritual care consult. Pt stated she was not interested in creating an Advanced Directive. Will be available if pt changes mind. Tamsen Snider  pager [phone]
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
## 2017-11-27 NOTE — Telephone Encounter (Signed)
Call transferred into triage. Pt at [facility]. There today for blood pressure check.  Around the end of the visit the patient reported to him that she has been experiencing chest pressure that is worsening over the last 3 days and her left arm is tingling. EKG done this morning that was reviewed by the patient's PCP there and reported no acute changes. PCP recommended a call to cardiology to review EKG and possibly be seen today.  I adv that if pt is currently having chest pressure and left arm tingling she needs to be seen in the ER. Adv that I will forward to Dr. Acie Fredrickson to inform. Lurena Joiner will inform patient and Dr. Chapman Fitch. Pt already has follow up scheduled on 10/23 with Lyda Jester, PA-C

## 2017-11-27 NOTE — Telephone Encounter (Signed)
Agree with plan per Rodman Key

## 2017-11-27 NOTE — ED Triage Notes (Signed)
Per EMS- pt here from doctors office for eval of left sided chest pain radiating into arm, with anxiety, nausea, vomiting and diarrhea since Friday. Given 324 Asprin.
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
## 2017-11-27 NOTE — Progress Notes (Signed)
   S:    Patient arrives complaining of "constant chest pain". She is here for hypertension management. Patient was referred by Dr. Chapman Fitch on 11/13/17.  BP 170/101 at that time. No medication regimen changes.  Today, patient denies adherence with medications. Reports not taking anti-hypertensives in ~6 months d/t lack of funds.  Complains of chest pressure and left arm numbness. Consulted with her PCP, Dr. Chapman Fitch who saw patient briefly. Pt seen by triage; EKG performed. Pt reports not using SL NTG since the beginning of September.   Current BP Medications include:   - Amlodipine 5 mg daily - Carvedilol 3.125 BID - HCTZ 25 mg daily  O:  146/91, HR 73 Last 3 Office BP readings: BP Readings from Last 3 Encounters:  11/13/17 (!) 170/101  10/16/17 115/81  01/26/17 108/74    BMET    Component Value Date/Time   NA 146 (H) 11/14/2017 1051   K 4.9 11/14/2017 1051   CL 102 11/14/2017 1051   CO2 20 11/14/2017 1051   GLUCOSE 81 11/14/2017 1051   GLUCOSE 126 (H) 09/21/2016 0324   BUN 14 11/14/2017 1051   CREATININE 0.98 11/14/2017 1051   CREATININE 0.88 04/15/2015 1128   CALCIUM 10.4 (H) 11/14/2017 1051   GFRNONAA 64 11/14/2017 1051   GFRNONAA 71 09/10/2014 1448   GFRAA 74 11/14/2017 1051   GFRAA 82 09/10/2014 1448    Renal function: CrCl cannot be calculated (Unknown ideal weight.).  A/P: Hypertension longstanding currently uncontrolled. Pt seen by Dr. Chapman Fitch. EKG interpreted by her. She instructed me to administer 4 (81mg ) baby ASA and to call EMS. EMS arrived and patient taken to hospital.   Results reviewed and written information provided.   Total time in face-to-face counseling 30 minutes.    Benard Halsted, PharmD, [place] [phone]

## 2017-11-28 ENCOUNTER — Observation Stay (HOSPITAL_COMMUNITY): Payer: Medicaid Other

## 2017-11-28 DIAGNOSIS — R079 Chest pain, unspecified: Secondary | ICD-10-CM

## 2017-11-28 LAB — NM MYOCAR MULTI W/SPECT W/WALL MOTION / EF
CHL CUP RESTING HR STRESS: 64 {beats}/min
CSEPEW: 1 METS
CSEPHR: 163 %
MPHR: 55 {beats}/min
Peak HR: 90 {beats}/min

## 2017-11-28 LAB — CBC
HEMATOCRIT: 40.5 % (ref 36.0–46.0)
HEMOGLOBIN: 12.8 g/dL (ref 12.0–15.0)
MCH: 28.4 pg (ref 26.0–34.0)
MCHC: 31.6 g/dL (ref 30.0–36.0)
MCV: 89.8 fL (ref 80.0–100.0)
Platelets: 324 10*3/uL (ref 150–400)
RBC: 4.51 MIL/uL (ref 3.87–5.11)
RDW: 17.2 % — ABNORMAL HIGH (ref 11.5–15.5)
WBC: 8.8 10*3/uL (ref 4.0–10.5)
nRBC: 0 % (ref 0.0–0.2)

## 2017-11-28 LAB — TROPONIN I
Troponin I: <0.03 ng/mL (ref <0.03)
Troponin I: <0.03 ng/mL (ref <0.03)

## 2017-11-28 LAB — BASIC METABOLIC PANEL
Anion gap: 10 (ref 5–15)
BUN: 16 mg/dL (ref 6–20)
CO2: 24 mmol/L (ref 22–32)
Calcium: 9.2 mg/dL (ref 8.9–10.3)
Chloride: 106 mmol/L (ref 98–111)
Creatinine, Ser: 0.91 mg/dL (ref 0.44–1.00)
GFR calc Af Amer: >60 mL/min (ref >60)
GLUCOSE: 100 mg/dL — AB (ref 70–99)
POTASSIUM: 3.1 mmol/L — AB (ref 3.5–5.1)
Sodium: 140 mmol/L (ref 135–145)

## 2017-11-28 LAB — LIPID PANEL
CHOL/HDL RATIO: 3.5 ratio
Cholesterol: 173 mg/dL (ref 0–200)
HDL: 49 mg/dL (ref >40)
LDL CALC: 99 mg/dL (ref 0–99)
TRIGLYCERIDES: 126 mg/dL (ref <150)
VLDL: 25 mg/dL (ref 0–40)

## 2017-11-28 LAB — HIV ANTIBODY (ROUTINE TESTING W REFLEX): HIV SCREEN 4TH GENERATION: NONREACTIVE

## 2017-11-28 MED ORDER — TECHNETIUM TC 99M TETROFOSMIN IV KIT
30.0000 | PACK | Freq: Once | INTRAVENOUS | Status: AC | PRN
  Administered 2017-11-28: 30 via INTRAVENOUS

## 2017-11-28 MED ORDER — SODIUM CHLORIDE 0.9 % WEIGHT BASED INFUSION
1.0000 mL/kg/h | INTRAVENOUS | Status: DC
  Administered 2017-11-29 (×2): 1 mL/kg/h via INTRAVENOUS

## 2017-11-28 MED ORDER — SODIUM CHLORIDE 0.9 % IV SOLN: 250.0000 mL | INTRAVENOUS | Status: DC | PRN

## 2017-11-28 MED ORDER — TECHNETIUM TC 99M TETROFOSMIN IV KIT
10.0000 | PACK | Freq: Once | INTRAVENOUS | Status: AC | PRN
  Administered 2017-11-28: 10 via INTRAVENOUS

## 2017-11-28 MED ORDER — METHOCARBAMOL 500 MG PO TABS
500.0000 mg | ORAL_TABLET | Freq: Three times a day (TID) | ORAL | Status: DC | PRN
  Administered 2017-11-28: 500 mg via ORAL
  Filled 2017-11-28: qty 1

## 2017-11-28 MED ORDER — SODIUM CHLORIDE 0.9% FLUSH: 3.0000 mL | INTRAVENOUS | Status: DC | PRN

## 2017-11-28 MED ORDER — MORPHINE SULFATE (PF) 2 MG/ML IV SOLN
1.0000 mg | Freq: Once | INTRAVENOUS | Status: AC | PRN
  Administered 2017-11-29: 1 mg via INTRAVENOUS
  Filled 2017-11-28: qty 1

## 2017-11-28 MED ORDER — REGADENOSON 0.4 MG/5ML IV SOLN
INTRAVENOUS | Status: AC
  Filled 2017-11-28: qty 5

## 2017-11-28 MED ORDER — REGADENOSON 0.4 MG/5ML IV SOLN
0.4000 mg | Freq: Once | INTRAVENOUS | Status: AC
  Administered 2017-11-28: 0.4 mg via INTRAVENOUS
  Filled 2017-11-28: qty 5

## 2017-11-28 MED ORDER — SODIUM CHLORIDE 0.9% FLUSH
3.0000 mL | Freq: Two times a day (BID) | INTRAVENOUS | Status: DC
  Administered 2017-11-28: 3 mL via INTRAVENOUS

## 2017-11-28 MED ORDER — SODIUM CHLORIDE 0.9 % WEIGHT BASED INFUSION
3.0000 mL/kg/h | INTRAVENOUS | Status: DC
  Administered 2017-11-29: 3 mL/kg/h via INTRAVENOUS

## 2017-11-28 MED ORDER — QUETIAPINE FUMARATE 50 MG PO TABS
50.0000 mg | ORAL_TABLET | Freq: Every day | ORAL | Status: AC
  Administered 2017-11-28: 50 mg via ORAL
  Filled 2017-11-28: qty 1

## 2017-11-28 MED ORDER — QUETIAPINE FUMARATE 50 MG PO TABS
50.0000 mg | ORAL_TABLET | Freq: Every morning | ORAL | Status: DC
  Administered 2017-11-29: 50 mg via ORAL
  Filled 2017-11-28 (×2): qty 1

## 2017-11-28 MED ORDER — ASPIRIN 81 MG PO CHEW
81.0000 mg | CHEWABLE_TABLET | ORAL | Status: AC
  Administered 2017-11-29: 81 mg via ORAL
  Filled 2017-11-28: qty 1

## 2017-11-28 NOTE — Progress Notes (Addendum)
Myoview report:    There was no ST segment deviation noted during stress.  T wave inversion was noted during stress in the I and aVL leads, beginning at 0 minutes of stress. T wave inversion persisted.  There is a small defect of mild severity present in the basal inferolateral and mid inferolateral location. The defect is reversible. Likely related to attenuation artifact but cannot rule out a subtle area of ischemia.  There is a small defect of mild severity present in the mid anterior and apical anterior location. The defect is partially reversible. This is likely related to variations in breast attenuation artifact but cannot rule out a small area of ischemia.  The left ventricular ejection fraction is normal (55-65%).  Nuclear stress EF: 65%.  This is a low to intermediate risk study but poor quality. There is significant extracardiac uptake as well as breast and diaphragmatic attenuation. Counts on stress images are reduced in all areas compared to rest images making interpretation difficult.   Per Dr. Angelena Form, plan for cath tomorrow.   Hilbert Corrigan PA Pager: [phone]  Abnormal stress test. History of complex CAD with prior CABG and left main stenting following her CABG due to atretic LIMA to LAD. Ongoing chest pain with mostly atypical features but given her history and abnormal stress test, will proceed with diagnostic cath tomorrow.  I have reviewed the risks, indications, and alternatives to cardiac catheterization, possible angioplasty, and stenting with the patient. Risks include but are not limited to bleeding, infection, vascular injury, stroke, myocardial infection, arrhythmia, kidney injury, radiation-related injury in the case of prolonged fluoroscopy use, emergency cardiac surgery, and death. The patient understands the risks of serious complication is 1-2 in 4599 with diagnostic cardiac cath and 1-2% or less with angioplasty/stenting.  Lindsay Rivera. 11/28/2017 6:21 PM

## 2017-11-28 NOTE — Progress Notes (Signed)
RN entered Pt's room to give Rx & make up bed, Pt pacing room. Pt verbally aggressive. Stating she does not want RN as her Nurse.

## 2017-11-28 NOTE — Progress Notes (Signed)
Pt requesting to have 50mg  dose of Seroquel tonight instead of 600mg . Consulted with pharmacy and paged cards. Pt is frustrated stating last night they gave her the 50mg  dose and this morning her nurse didn't give her any of it. Pt calling out multiple times complaining that she will be telling somebody about this. This RN told this pt that we need an MD order in place to give her the medications and we are not allowed to just give her anything.

## 2017-11-28 NOTE — Progress Notes (Signed)
Pt's consent form at bedside, signed by pt and this RN. Surgeon to speak with pt in morning.

## 2017-11-28 NOTE — Care Management Note (Signed)
Case Management Note  Patient Details  Name: Lindsay Rivera MRN: [id number] Date of Birth: [birth year]  Subjective/Objective:  57yo female presented from her PCP with CP with no SOB.                 Action/Plan: Patient lives at home and is followed by [facility] for PCP and Rx needs. Patient can obtain her Rx from [facility] for $4-10. CM will continue to follow.  Expected Discharge Date:  11/28/17               Expected Discharge Plan:  Home/Self Care  In-House Referral:  NA  Discharge planning Services  CM Consult  Post Acute Care Choice:  NA Choice offered to:  NA  DME Arranged:  N/A DME Agency:  NA  HH Arranged:  NA HH Agency:  NA  Status of Service:  In process, will continue to follow  If discussed at Long Length of Stay Meetings, dates discussed:    Additional Comments:  Midge Minium RN, BSN, NCM-BC, ACM-RN [phone] 11/28/2017, 4:20 PM

## 2017-11-28 NOTE — Progress Notes (Addendum)
Progress Note  Patient Name: Lindsay Rivera Date of Encounter: 11/28/2017  Primary Cardiologist: Lindsay Moores, MD   Subjective   Persistent chest pain for weeks, no SOB.   Inpatient Medications    Scheduled Meds: . amLODipine  5 mg Oral Daily  . aspirin EC  81 mg Oral Daily  . atorvastatin  80 mg Oral QHS  . buPROPion  150 mg Oral QPM  . carvedilol  6.25 mg Oral BID  . darifenacin  7.5 mg Oral Daily  . heparin  5,000 Units Subcutaneous Q8H  . hydrOXYzine  25 mg Oral QHS  . levothyroxine  125 mcg Oral QAC breakfast  . OLANZapine  20 mg Oral QHS  . pantoprazole  40 mg Oral Daily  . [START ON 11/29/2017] QUEtiapine  50 mg Oral q morning - 10a  . QUEtiapine  600 mg Oral QHS  . regadenoson       Continuous Infusions:  PRN Meds: acetaminophen, albuterol, methocarbamol, morphine injection, nitroGLYCERIN, ondansetron (ZOFRAN) IV   Vital Signs    Vitals:   11/28/17 0928 11/28/17 0929 11/28/17 0931 11/28/17 0933  BP: (!) 123/95 129/88 130/87   Pulse: 85 86 78 76  Resp:      Temp:      TempSrc:      SpO2:       No intake or output data in the 24 hours ending 11/28/17 1102 There were no vitals filed for this visit.  Telemetry    NSR without significant arrhythmia - Personally Reviewed  ECG    NSR without significant ST-T wave changes - Personally Reviewed  Physical Exam   GEN: No acute distress.   Neck: No JVD Cardiac: RRR, no murmurs, rubs, or gallops.  Respiratory: Clear to auscultation bilaterally. GI: Soft, nontender, non-distended  MS: No edema; No deformity. Neuro:  Nonfocal  Psych: Normal affect   Labs    Chemistry Recent Labs  Lab 11/27/17 1139 11/27/17 1526 11/28/17 0611  NA 139  --  140  K 3.6  --  3.1*  CL 105  --  106  CO2 20*  --  24  GLUCOSE 86  --  100*  BUN 11  --  16  CREATININE 0.80 0.85 0.91  CALCIUM 9.8  --  9.2  GFRNONAA >60 >60 >60  GFRAA >60 >60 >60  ANIONGAP 14  --  10     Hematology Recent Labs  Lab  11/27/17 1139 11/27/17 1526 11/28/17 0611  WBC 8.2 8.9 8.8  RBC 5.44* 4.88 4.51  HGB 15.5* 14.0 12.8  HCT 53.9* 43.8 40.5  MCV 99.1 89.8 89.8  MCH 28.5 28.7 28.4  MCHC 28.8* 32.0 31.6  RDW 17.8* 17.0* 17.2*  PLT 288 369 324    Cardiac Enzymes Recent Labs  Lab 11/27/17 1526 11/28/17 0040 11/28/17 0611  TROPONINI <0.03 <0.03 <0.03    Recent Labs  Lab 11/27/17 1220  TROPIPOC 0.07     BNPNo results for input(s): BNP, PROBNP in the last 168 hours.   DDimer No results for input(s): DDIMER in the last 168 hours.   Radiology    Dg Chest 2 View  Result Date: 11/27/2017 CLINICAL DATA:  Chest pain.  Hypertension. EXAM: CHEST - 2 VIEW COMPARISON:  September 20, 2016 FINDINGS: There is no edema or consolidation. Heart size and pulmonary vascularity are normal. No adenopathy. Patient is status post coronary artery bypass grafting. An apparent pacemaker lead is seen slightly to the left of midline anteriorly. There is  aortic atherosclerosis. There is degenerative change in each shoulder and in the thoracic spine. IMPRESSION: No edema or consolidation. Heart size normal. Areas of postoperative change. There is aortic atherosclerosis. Aortic Atherosclerosis (ICD10-I70.0). Electronically Signed   By: Lowella Grip III M.D.   On: 11/27/2017 11:37    Cardiac Studies   Cath 04/17/2015  Prox RCA lesion, 30% stenosed.  Mid RCA lesion, 60% stenosed.  Dist RCA lesion, 90% stenosed.  SVG was injected is normal in caliber, and is anatomically normal.  RIMA was injected is normal in caliber, and is anatomically normal.  Free RIMA graft patent to diagonal.  Mid Cx lesion, 50% stenosed.  LIMA was injected .  There is severe disease in the graft.  Atretic LIMA graft which had supplied the LAD.  Mid Graft to Dist Graft lesion, 100% stenosed.  LM lesion, 85% stenosed. Post intervention, there is a 0% residual stenosis.  The left ventricular systolic function is normal  Patient  Profile     57 y.o. female with a hx of CAD s/p CABG x 3 and PCI afterwards to ostial LAD, DM, hypertension, CVA, hypothyroidism, bipolar disorder, hyperlipidemia, HSV on Valtrex, chronic diastolic heart failure and noncompliance presented with persistent chest pain x 3 week  Assessment & Plan    1. Chest pain in the setting of noncompliance and uncontroleld HTN  - active chest pain for 3 weeks, planning for myoview today, read by [facility] reader. Trop negative.   2. Uncontrolled HTN: BP better controlled  3. Chronic diastolic HF: euvolemic on exam  4. CAD s/p CABG x 4: persistent chest pain for the past 3 weeks, negative serial trop.   5. Palpitation: No significant arrhythmia on telemetry.  6. Hypokalemia: K 3.1 today, improving.     For questions or updates, please contact [place] Please consult [email or web address] for contact info under        Signed, Lindsay Rivera, [place]  11/28/2017, 11:02 AM    I have personally seen and examined this patient with Lindsay Deforest, PA. I agree with the assessment and plan as outlined above. She has atypical chest pain. Troponin is negative. EKG without ischemic changes. Stress test is pending. If her stress test does not show any large areas of ischemia, will discharge home today. Will follow up on stress test this afternoon.   Lindsay Rivera 11/28/2017 12:01 PM

## 2017-11-28 NOTE — Progress Notes (Signed)
Pt gone for stress test

## 2017-11-29 ENCOUNTER — Ambulatory Visit: Payer: Self-pay | Admitting: Cardiology

## 2017-11-29 ENCOUNTER — Encounter (HOSPITAL_COMMUNITY)
Admission: EM | Disposition: A | Discharge status: Home / Self Care | Payer: Self-pay | Source: Home / Self Care | Attending: Cardiovascular Disease

## 2017-11-29 DIAGNOSIS — I2510 Atherosclerotic heart disease of native coronary artery without angina pectoris: Principal | ICD-10-CM

## 2017-11-29 DIAGNOSIS — E785 Hyperlipidemia, unspecified: Secondary | ICD-10-CM

## 2017-11-29 HISTORY — PX: LEFT HEART CATH AND CORS/GRAFTS ANGIOGRAPHY: CATH118250

## 2017-11-29 LAB — BASIC METABOLIC PANEL
Anion gap: 5 (ref 5–15)
BUN: 12 mg/dL (ref 6–20)
CHLORIDE: 109 mmol/L (ref 98–111)
CO2: 28 mmol/L (ref 22–32)
Calcium: 9.1 mg/dL (ref 8.9–10.3)
Creatinine, Ser: 0.82 mg/dL (ref 0.44–1.00)
GFR calc Af Amer: >60 mL/min (ref >60)
GFR calc non Af Amer: >60 mL/min (ref >60)
Glucose, Bld: 104 mg/dL — ABNORMAL HIGH (ref 70–99)
POTASSIUM: 3.8 mmol/L (ref 3.5–5.1)
SODIUM: 142 mmol/L (ref 135–145)

## 2017-11-29 LAB — GLUCOSE, CAPILLARY: Glucose-Capillary: 239 mg/dL — ABNORMAL HIGH (ref 70–99)

## 2017-11-29 SURGERY — LEFT HEART CATH AND CORS/GRAFTS ANGIOGRAPHY
Anesthesia: LOCAL

## 2017-11-29 MED ORDER — LIDOCAINE HCL (PF) 1 % IJ SOLN
INTRAMUSCULAR | Status: DC | PRN
  Administered 2017-11-29: 2 mL

## 2017-11-29 MED ORDER — SODIUM CHLORIDE 0.9 % IV SOLN: 250.0000 mL | INTRAVENOUS | Status: DC | PRN

## 2017-11-29 MED ORDER — SODIUM CHLORIDE 0.9% FLUSH: 3.0000 mL | INTRAVENOUS | Status: DC | PRN

## 2017-11-29 MED ORDER — HEPARIN (PORCINE) IN NACL 1000-0.9 UT/500ML-% IV SOLN
INTRAVENOUS | Status: DC | PRN
  Administered 2017-11-29: 500 mL

## 2017-11-29 MED ORDER — MIDAZOLAM HCL 2 MG/2ML IJ SOLN
INTRAMUSCULAR | Status: DC | PRN
  Administered 2017-11-29: 1 mg via INTRAVENOUS

## 2017-11-29 MED ORDER — LIDOCAINE HCL (PF) 1 % IJ SOLN
INTRAMUSCULAR | Status: AC
  Filled 2017-11-29: qty 30

## 2017-11-29 MED ORDER — SODIUM CHLORIDE 0.9 % WEIGHT BASED INFUSION
1.0000 mL/kg/h | INTRAVENOUS | Status: AC
  Administered 2017-11-29: 1 mL/kg/h via INTRAVENOUS

## 2017-11-29 MED ORDER — IOHEXOL 350 MG/ML SOLN
INTRAVENOUS | Status: DC | PRN
  Administered 2017-11-29: 115 mL via INTRA_ARTERIAL

## 2017-11-29 MED ORDER — VERAPAMIL HCL 2.5 MG/ML IV SOLN
INTRAVENOUS | Status: DC | PRN
  Administered 2017-11-29: 10 mL via INTRA_ARTERIAL

## 2017-11-29 MED ORDER — VERAPAMIL HCL 2.5 MG/ML IV SOLN
INTRAVENOUS | Status: AC
  Filled 2017-11-29: qty 2

## 2017-11-29 MED ORDER — HEPARIN (PORCINE) IN NACL 1000-0.9 UT/500ML-% IV SOLN
INTRAVENOUS | Status: AC
  Filled 2017-11-29: qty 1000

## 2017-11-29 MED ORDER — MIDAZOLAM HCL 2 MG/2ML IJ SOLN
INTRAMUSCULAR | Status: AC
  Filled 2017-11-29: qty 2

## 2017-11-29 MED ORDER — HEPARIN SODIUM (PORCINE) 1000 UNIT/ML IJ SOLN
INTRAMUSCULAR | Status: DC | PRN
  Administered 2017-11-29: 4000 [IU] via INTRAVENOUS

## 2017-11-29 MED ORDER — FENTANYL CITRATE (PF) 100 MCG/2ML IJ SOLN
INTRAMUSCULAR | Status: DC | PRN
  Administered 2017-11-29: 25 ug via INTRAVENOUS

## 2017-11-29 MED ORDER — SODIUM CHLORIDE 0.9% FLUSH: 3.0000 mL | Freq: Two times a day (BID) | INTRAVENOUS | Status: DC

## 2017-11-29 MED ORDER — FENTANYL CITRATE (PF) 100 MCG/2ML IJ SOLN
INTRAMUSCULAR | Status: AC
  Filled 2017-11-29: qty 2

## 2017-11-29 MED FILL — HYDROCHLOROTHIAZIDE 25 MG T: 25 | 30 days supply | Qty: 30 | Fill #0

## 2017-11-29 SURGICAL SUPPLY — 12 items
CATH INFINITI 5 FR RCB (CATHETERS) ×2 IMPLANT
CATH INFINITI 5FR AL1 (CATHETERS) ×2 IMPLANT
CATH INFINITI 5FR MULTPACK ANG (CATHETERS) ×2 IMPLANT
DEVICE RAD COMP TR BAND LRG (VASCULAR PRODUCTS) ×2 IMPLANT
GLIDESHEATH SLEND SS 6F .021 (SHEATH) ×2 IMPLANT
GUIDEWIRE INQWIRE 1.5J.035X260 (WIRE) ×1 IMPLANT
INQWIRE 1.5J .035X260CM (WIRE) ×2
KIT HEART LEFT (KITS) ×2 IMPLANT
PACK CARDIAC CATHETERIZATION (CUSTOM PROCEDURE TRAY) ×2 IMPLANT
SYR MEDRAD MARK V 150ML (SYRINGE) ×2 IMPLANT
TRANSDUCER W/STOPCOCK (MISCELLANEOUS) ×2 IMPLANT
TUBING CIL FLEX 10 FLL-RA (TUBING) ×2 IMPLANT

## 2017-11-29 NOTE — Progress Notes (Signed)
Contacted cath lab for status check. The expect to get the patient around 5 pm.

## 2017-11-29 NOTE — Progress Notes (Addendum)
Progress Note  Patient Name: Lindsay Rivera Date of Encounter: 11/29/2017  Primary Cardiologist: Mertie Moores, MD  Subjective   Had some chest pain this morning and had nitro with relief.   Inpatient Medications    Scheduled Meds: . amLODipine  5 mg Oral Daily  . aspirin EC  81 mg Oral Daily  . atorvastatin  80 mg Oral QHS  . buPROPion  150 mg Oral QPM  . carvedilol  6.25 mg Oral BID  . darifenacin  7.5 mg Oral Daily  . heparin  5,000 Units Subcutaneous Q8H  . hydrOXYzine  25 mg Oral QHS  . levothyroxine  125 mcg Oral QAC breakfast  . OLANZapine  20 mg Oral QHS  . pantoprazole  40 mg Oral Daily  . QUEtiapine  50 mg Oral q morning - 10a  . QUEtiapine  600 mg Oral QHS  . sodium chloride flush  3 mL Intravenous Q12H   Continuous Infusions: . sodium chloride    . sodium chloride 1 mL/kg/hr (11/29/17 0517)   PRN Meds: sodium chloride, acetaminophen, albuterol, methocarbamol, morphine injection, nitroGLYCERIN, ondansetron (ZOFRAN) IV, sodium chloride flush   Vital Signs    Vitals:   11/28/17 2021 11/28/17 2030 11/28/17 2323 11/29/17 0600  BP: 116/82 (!) 88/67 130/90 (!) 138/112  Pulse: 62  68 64  Resp:    18  Temp:   (!) 97.5 F (36.4 C) (!) 97.5 F (36.4 C)  TempSrc:   Oral Oral  SpO2:   100% 100%  Weight:  73.4 kg    Height:  5\' 3"  (1.6 m)     No intake or output data in the 24 hours ending 11/29/17 0846 Filed Weights   11/28/17 2030  Weight: 73.4 kg    Telemetry    SR - Personally Reviewed  ECG    N/a - Personally Reviewed  Physical Exam   General: Well developed, well nourished, female appearing in no acute distress. Head: Normocephalic, atraumatic.  Neck: Supple without bruits, JVD. Lungs:  Resp regular and unlabored, CTA. Heart: RRR, S1, S2, no S3, S4, or murmur; no rub. Abdomen: Soft, non-tender, non-distended with normoactive bowel sounds. No hepatomegaly. No rebound/guarding. No obvious abdominal masses. Extremities: No clubbing,  cyanosis, edema. Distal pedal pulses are 2+ bilaterally. Neuro: Alert and oriented X 3. Moves all extremities spontaneously. Psych: Normal affect.  Labs    Chemistry Recent Labs  Lab 11/27/17 1139 11/27/17 1526 11/28/17 0611  NA 139  --  140  K 3.6  --  3.1*  CL 105  --  106  CO2 20*  --  24  GLUCOSE 86  --  100*  BUN 11  --  16  CREATININE 0.80 0.85 0.91  CALCIUM 9.8  --  9.2  GFRNONAA >60 >60 >60  GFRAA >60 >60 >60  ANIONGAP 14  --  10     Hematology Recent Labs  Lab 11/27/17 1139 11/27/17 1526 11/28/17 0611  WBC 8.2 8.9 8.8  RBC 5.44* 4.88 4.51  HGB 15.5* 14.0 12.8  HCT 53.9* 43.8 40.5  MCV 99.1 89.8 89.8  MCH 28.5 28.7 28.4  MCHC 28.8* 32.0 31.6  RDW 17.8* 17.0* 17.2*  PLT 288 369 324    Cardiac Enzymes Recent Labs  Lab 11/27/17 1526 11/28/17 0040 11/28/17 0611  TROPONINI <0.03 <0.03 <0.03    Recent Labs  Lab 11/27/17 1220  TROPIPOC 0.07     BNPNo results for input(s): BNP, PROBNP in the last 168 hours.  DDimer No results for input(s): DDIMER in the last 168 hours.    Radiology    Dg Chest 2 View  Result Date: 11/27/2017 CLINICAL DATA:  Chest pain.  Hypertension. EXAM: CHEST - 2 VIEW COMPARISON:  September 20, 2016 FINDINGS: There is no edema or consolidation. Heart size and pulmonary vascularity are normal. No adenopathy. Patient is status post coronary artery bypass grafting. An apparent pacemaker lead is seen slightly to the left of midline anteriorly. There is aortic atherosclerosis. There is degenerative change in each shoulder and in the thoracic spine. IMPRESSION: No edema or consolidation. Heart size normal. Areas of postoperative change. There is aortic atherosclerosis. Aortic Atherosclerosis (ICD10-I70.0). Electronically Signed   By: Lowella Grip III M.D.   On: 11/27/2017 11:37   Nm Myocar Multi W/spect W/wall Motion / Ef  Result Date: 11/28/2017  There was no ST segment deviation noted during stress.  T wave inversion was  noted during stress in the I and aVL leads, beginning at 0 minutes of stress. T wave inversion persisted.  There is a small defect of mild severity present in the basal inferolateral and mid inferolateral location. The defect is reversible. Likely related to attenuation artifact but cannot rule out a subtle area of ischemia.  There is a small defect of mild severity present in the mid anterior and apical anterior location. The defect is partially reversible. This is likely related to variations in breast attenuation artifact but cannot rule out a small area of ischemia.  The left ventricular ejection fraction is normal (55-65%).  Nuclear stress EF: 65%.  This is a low to intermediate risk study but poor quality. There is significant extracardiac uptake as well as breast and diaphragmatic attenuation. Counts on stress images are reduced in all areas compared to rest images making interpretation difficult.     Cardiac Studies   Lexiscan: 11/28/17   There was no ST segment deviation noted during stress.  T wave inversion was noted during stress in the I and aVL leads, beginning at 0 minutes of stress. T wave inversion persisted.  There is a small defect of mild severity present in the basal inferolateral and mid inferolateral location. The defect is reversible. Likely related to attenuation artifact but cannot rule out a subtle area of ischemia.  There is a small defect of mild severity present in the mid anterior and apical anterior location. The defect is partially reversible. This is likely related to variations in breast attenuation artifact but cannot rule out a small area of ischemia.  The left ventricular ejection fraction is normal (55-65%).  Nuclear stress EF: 65%.  This is a low to intermediate risk study but poor quality. There is significant extracardiac uptake as well as breast and diaphragmatic attenuation. Counts on stress images are reduced in all areas compared to rest images  making interpretation difficult.  Patient Profile     57 y.o. female with a hx of CAD s/p CABG x 3 and PCI afterwards to ostial LAD, DM,hypertension, CVA, hypothyroidism, bipolar disorder, hyperlipidemia,HSV on Valtrex,chronic diastolic heart failure and noncompliance presented with persistent chest pain x 3 week.   Assessment & Plan    1. Chest pain in the setting of noncompliance and uncontroleld HTN             - active chest pain for 3 weeks, underwent myoview yesterday that was mildly abnormal. With ongoing chest pain, planned for cardiac cath today. Had some chest pain this morning which was relieved with nitro.  --  The patient understands that risks included but are not limited to stroke (1 in 1000), death (1 in 19), kidney failure [usually temporary] (1 in 500), bleeding (1 in 200), allergic reaction [possibly serious] (1 in 200).   2. Uncontrolled HTN: BP better controlled  3. Chronic diastolic HF: euvolemic on exam  4. CAD s/p CABG x 4: persistent chest pain for the past 3 weeks, negative serial trop.   5. Palpitation: No significant arrhythmia on telemetry.  6. Hypokalemia: K 3.1 yesterday. Recheck this morning.   7. HL: LDL 99, on high dose statin.   Signed, Reino Bellis, NP  11/29/2017, 8:46 AM  Pager # [phone]   For questions or updates, please contact [place] Please consult [email or web address] for contact info under Cardiology/STEMI.   Patient seen, examined. Available data reviewed. Agree with findings, assessment, and plan as outlined by Reino Bellis, NP.  The patient is independently interviewed and examined.  She is alert, oriented, in no distress.  Lungs are clear, heart is regular rate and rhythm with no murmur or gallop, abdomen is soft and nontender, extremities show no edema, skin is warm and dry without rash.  The patient continues to have intermittent chest pain requiring nitroglycerin.  Her pain is somewhat atypical.  Her stress test is  reviewed and really does not help much in decision-making.  Considering her history of extensive CAD, prior CABG, and prior PCI, now with ongoing chest discomfort, I think I agree that cardiac catheterization and possible PCI is clearly indicated.  Risks, benefits, and alternatives have been reviewed with the patient.  She is scheduled for later this afternoon.  She will continue on current medical therapy for now.  Sherren Mocha, M.D. 11/29/2017 11:36 AM

## 2017-11-29 NOTE — Progress Notes (Signed)
Pt resting and waiting for cath lab procedure.

## 2017-11-29 NOTE — Interval H&P Note (Signed)
History and Physical Interval Note:  11/29/2017 5:25 PM  Lindsay Rivera  has presented today for surgery, with the diagnosis of ua  The various methods of treatment have been discussed with the patient and family. After consideration of risks, benefits and other options for treatment, the patient has consented to  Procedure(s): LEFT HEART CATH AND CORS/GRAFTS ANGIOGRAPHY (N/A) as a surgical intervention .  The patient's history has been reviewed, patient examined, no change in status, stable for surgery.  I have reviewed the patient's chart and labs.  Questions were answered to the patient's satisfaction.   Cath Lab Visit (complete for each Cath Lab visit)  Clinical Evaluation Leading to the Procedure:   ACS: Yes.    Non-ACS:    Anginal Classification: CCS IV  Anti-ischemic medical therapy: Maximal Therapy (2 or more classes of medications)  Non-Invasive Test Results: Low-risk stress test findings: cardiac mortality <1%/year  Prior CABG: Previous CABG        [name] [facility] 11/29/2017 5:26 PM

## 2017-11-29 NOTE — H&P (View-Only) (Signed)
Progress Note  Patient Name: Lindsay Rivera Date of Encounter: 11/29/2017  Primary Cardiologist: Mertie Moores, MD  Subjective   Had some chest pain this morning and had nitro with relief.   Inpatient Medications    Scheduled Meds: . amLODipine  5 mg Oral Daily  . aspirin EC  81 mg Oral Daily  . atorvastatin  80 mg Oral QHS  . buPROPion  150 mg Oral QPM  . carvedilol  6.25 mg Oral BID  . darifenacin  7.5 mg Oral Daily  . heparin  5,000 Units Subcutaneous Q8H  . hydrOXYzine  25 mg Oral QHS  . levothyroxine  125 mcg Oral QAC breakfast  . OLANZapine  20 mg Oral QHS  . pantoprazole  40 mg Oral Daily  . QUEtiapine  50 mg Oral q morning - 10a  . QUEtiapine  600 mg Oral QHS  . sodium chloride flush  3 mL Intravenous Q12H   Continuous Infusions: . sodium chloride    . sodium chloride 1 mL/kg/hr (11/29/17 0517)   PRN Meds: sodium chloride, acetaminophen, albuterol, methocarbamol, morphine injection, nitroGLYCERIN, ondansetron (ZOFRAN) IV, sodium chloride flush   Vital Signs    Vitals:   11/28/17 2021 11/28/17 2030 11/28/17 2323 11/29/17 0600  BP: 116/82 (!) 88/67 130/90 (!) 138/112  Pulse: 62  68 64  Resp:    18  Temp:   (!) 97.5 F (36.4 C) (!) 97.5 F (36.4 C)  TempSrc:   Oral Oral  SpO2:   100% 100%  Weight:  73.4 kg    Height:  5\' 3"  (1.6 m)     No intake or output data in the 24 hours ending 11/29/17 0846 Filed Weights   11/28/17 2030  Weight: 73.4 kg    Telemetry    SR - Personally Reviewed  ECG    N/a - Personally Reviewed  Physical Exam   General: Well developed, well nourished, female appearing in no acute distress. Head: Normocephalic, atraumatic.  Neck: Supple without bruits, JVD. Lungs:  Resp regular and unlabored, CTA. Heart: RRR, S1, S2, no S3, S4, or murmur; no rub. Abdomen: Soft, non-tender, non-distended with normoactive bowel sounds. No hepatomegaly. No rebound/guarding. No obvious abdominal masses. Extremities: No clubbing,  cyanosis, edema. Distal pedal pulses are 2+ bilaterally. Neuro: Alert and oriented X 3. Moves all extremities spontaneously. Psych: Normal affect.  Labs    Chemistry Recent Labs  Lab 11/27/17 1139 11/27/17 1526 11/28/17 0611  NA 139  --  140  K 3.6  --  3.1*  CL 105  --  106  CO2 20*  --  24  GLUCOSE 86  --  100*  BUN 11  --  16  CREATININE 0.80 0.85 0.91  CALCIUM 9.8  --  9.2  GFRNONAA >60 >60 >60  GFRAA >60 >60 >60  ANIONGAP 14  --  10     Hematology Recent Labs  Lab 11/27/17 1139 11/27/17 1526 11/28/17 0611  WBC 8.2 8.9 8.8  RBC 5.44* 4.88 4.51  HGB 15.5* 14.0 12.8  HCT 53.9* 43.8 40.5  MCV 99.1 89.8 89.8  MCH 28.5 28.7 28.4  MCHC 28.8* 32.0 31.6  RDW 17.8* 17.0* 17.2*  PLT 288 369 324    Cardiac Enzymes Recent Labs  Lab 11/27/17 1526 11/28/17 0040 11/28/17 0611  TROPONINI <0.03 <0.03 <0.03    Recent Labs  Lab 11/27/17 1220  TROPIPOC 0.07     BNPNo results for input(s): BNP, PROBNP in the last 168 hours.  DDimer No results for input(s): DDIMER in the last 168 hours.    Radiology    Dg Chest 2 View  Result Date: 11/27/2017 CLINICAL DATA:  Chest pain.  Hypertension. EXAM: CHEST - 2 VIEW COMPARISON:  September 20, 2016 FINDINGS: There is no edema or consolidation. Heart size and pulmonary vascularity are normal. No adenopathy. Patient is status post coronary artery bypass grafting. An apparent pacemaker lead is seen slightly to the left of midline anteriorly. There is aortic atherosclerosis. There is degenerative change in each shoulder and in the thoracic spine. IMPRESSION: No edema or consolidation. Heart size normal. Areas of postoperative change. There is aortic atherosclerosis. Aortic Atherosclerosis (ICD10-I70.0). Electronically Signed   By: Lowella Grip III M.D.   On: 11/27/2017 11:37   Nm Myocar Multi W/spect W/wall Motion / Ef  Result Date: 11/28/2017  There was no ST segment deviation noted during stress.  T wave inversion was  noted during stress in the I and aVL leads, beginning at 0 minutes of stress. T wave inversion persisted.  There is a small defect of mild severity present in the basal inferolateral and mid inferolateral location. The defect is reversible. Likely related to attenuation artifact but cannot rule out a subtle area of ischemia.  There is a small defect of mild severity present in the mid anterior and apical anterior location. The defect is partially reversible. This is likely related to variations in breast attenuation artifact but cannot rule out a small area of ischemia.  The left ventricular ejection fraction is normal (55-65%).  Nuclear stress EF: 65%.  This is a low to intermediate risk study but poor quality. There is significant extracardiac uptake as well as breast and diaphragmatic attenuation. Counts on stress images are reduced in all areas compared to rest images making interpretation difficult.     Cardiac Studies   Lexiscan: 11/28/17   There was no ST segment deviation noted during stress.  T wave inversion was noted during stress in the I and aVL leads, beginning at 0 minutes of stress. T wave inversion persisted.  There is a small defect of mild severity present in the basal inferolateral and mid inferolateral location. The defect is reversible. Likely related to attenuation artifact but cannot rule out a subtle area of ischemia.  There is a small defect of mild severity present in the mid anterior and apical anterior location. The defect is partially reversible. This is likely related to variations in breast attenuation artifact but cannot rule out a small area of ischemia.  The left ventricular ejection fraction is normal (55-65%).  Nuclear stress EF: 65%.  This is a low to intermediate risk study but poor quality. There is significant extracardiac uptake as well as breast and diaphragmatic attenuation. Counts on stress images are reduced in all areas compared to rest images  making interpretation difficult.  Patient Profile     57 y.o. female with a hx of CAD s/p CABG x 3 and PCI afterwards to ostial LAD, DM,hypertension, CVA, hypothyroidism, bipolar disorder, hyperlipidemia,HSV on Valtrex,chronic diastolic heart failure and noncompliance presented with persistent chest pain x 3 week.   Assessment & Plan    1. Chest pain in the setting of noncompliance and uncontroleld HTN             - active chest pain for 3 weeks, underwent myoview yesterday that was mildly abnormal. With ongoing chest pain, planned for cardiac cath today. Had some chest pain this morning which was relieved with nitro.  --  The patient understands that risks included but are not limited to stroke (1 in 1000), death (1 in 84), kidney failure [usually temporary] (1 in 500), bleeding (1 in 200), allergic reaction [possibly serious] (1 in 200).   2. Uncontrolled HTN: BP better controlled  3. Chronic diastolic HF: euvolemic on exam  4. CAD s/p CABG x 4: persistent chest pain for the past 3 weeks, negative serial trop.   5. Palpitation: No significant arrhythmia on telemetry.  6. Hypokalemia: K 3.1 yesterday. Recheck this morning.   7. HL: LDL 99, on high dose statin.   Signed, Reino Bellis, NP  11/29/2017, 8:46 AM  Pager # [phone]   For questions or updates, please contact [place] Please consult [email or web address] for contact info under Cardiology/STEMI.   Patient seen, examined. Available data reviewed. Agree with findings, assessment, and plan as outlined by Reino Bellis, NP.  The patient is independently interviewed and examined.  She is alert, oriented, in no distress.  Lungs are clear, heart is regular rate and rhythm with no murmur or gallop, abdomen is soft and nontender, extremities show no edema, skin is warm and dry without rash.  The patient continues to have intermittent chest pain requiring nitroglycerin.  Her pain is somewhat atypical.  Her stress test is  reviewed and really does not help much in decision-making.  Considering her history of extensive CAD, prior CABG, and prior PCI, now with ongoing chest discomfort, I think I agree that cardiac catheterization and possible PCI is clearly indicated.  Risks, benefits, and alternatives have been reviewed with the patient.  She is scheduled for later this afternoon.  She will continue on current medical therapy for now.  Sherren Mocha, M.D. 11/29/2017 11:36 AM

## 2017-11-30 ENCOUNTER — Encounter (HOSPITAL_COMMUNITY): Payer: Self-pay | Admitting: Cardiology

## 2017-11-30 DIAGNOSIS — E119 Type 2 diabetes mellitus without complications: Secondary | ICD-10-CM | POA: Diagnosis not present

## 2017-11-30 DIAGNOSIS — R402362 Coma scale, best motor response, obeys commands, at arrival to emergency department: Secondary | ICD-10-CM | POA: Diagnosis not present

## 2017-11-30 DIAGNOSIS — Z951 Presence of aortocoronary bypass graft: Secondary | ICD-10-CM | POA: Diagnosis not present

## 2017-11-30 DIAGNOSIS — Z8673 Personal history of transient ischemic attack (TIA), and cerebral infarction without residual deficits: Secondary | ICD-10-CM | POA: Diagnosis not present

## 2017-11-30 DIAGNOSIS — Z79890 Hormone replacement therapy (postmenopausal): Secondary | ICD-10-CM | POA: Diagnosis not present

## 2017-11-30 DIAGNOSIS — I110 Hypertensive heart disease with heart failure: Secondary | ICD-10-CM | POA: Diagnosis not present

## 2017-11-30 DIAGNOSIS — I252 Old myocardial infarction: Secondary | ICD-10-CM | POA: Diagnosis not present

## 2017-11-30 DIAGNOSIS — M545 Low back pain: Secondary | ICD-10-CM | POA: Diagnosis present

## 2017-11-30 DIAGNOSIS — R402142 Coma scale, eyes open, spontaneous, at arrival to emergency department: Secondary | ICD-10-CM | POA: Diagnosis not present

## 2017-11-30 DIAGNOSIS — I2510 Atherosclerotic heart disease of native coronary artery without angina pectoris: Secondary | ICD-10-CM | POA: Diagnosis not present

## 2017-11-30 DIAGNOSIS — Z955 Presence of coronary angioplasty implant and graft: Secondary | ICD-10-CM | POA: Diagnosis not present

## 2017-11-30 DIAGNOSIS — Z8249 Family history of ischemic heart disease and other diseases of the circulatory system: Secondary | ICD-10-CM | POA: Diagnosis not present

## 2017-11-30 DIAGNOSIS — E039 Hypothyroidism, unspecified: Secondary | ICD-10-CM | POA: Diagnosis not present

## 2017-11-30 DIAGNOSIS — G8929 Other chronic pain: Secondary | ICD-10-CM | POA: Diagnosis present

## 2017-11-30 DIAGNOSIS — R402252 Coma scale, best verbal response, oriented, at arrival to emergency department: Secondary | ICD-10-CM | POA: Diagnosis not present

## 2017-11-30 DIAGNOSIS — G43909 Migraine, unspecified, not intractable, without status migrainosus: Secondary | ICD-10-CM | POA: Diagnosis present

## 2017-11-30 DIAGNOSIS — Z90710 Acquired absence of both cervix and uterus: Secondary | ICD-10-CM | POA: Diagnosis not present

## 2017-11-30 DIAGNOSIS — Z7902 Long term (current) use of antithrombotics/antiplatelets: Secondary | ICD-10-CM | POA: Diagnosis not present

## 2017-11-30 DIAGNOSIS — F419 Anxiety disorder, unspecified: Secondary | ICD-10-CM | POA: Diagnosis present

## 2017-11-30 DIAGNOSIS — E785 Hyperlipidemia, unspecified: Secondary | ICD-10-CM | POA: Diagnosis not present

## 2017-11-30 DIAGNOSIS — Z7982 Long term (current) use of aspirin: Secondary | ICD-10-CM | POA: Diagnosis not present

## 2017-11-30 DIAGNOSIS — I5032 Chronic diastolic (congestive) heart failure: Secondary | ICD-10-CM | POA: Diagnosis not present

## 2017-11-30 DIAGNOSIS — K219 Gastro-esophageal reflux disease without esophagitis: Secondary | ICD-10-CM | POA: Diagnosis not present

## 2017-11-30 DIAGNOSIS — Z7984 Long term (current) use of oral hypoglycemic drugs: Secondary | ICD-10-CM | POA: Diagnosis not present

## 2017-11-30 DIAGNOSIS — R079 Chest pain, unspecified: Secondary | ICD-10-CM | POA: Diagnosis present

## 2017-11-30 LAB — GLUCOSE, CAPILLARY: Glucose-Capillary: 101 mg/dL — ABNORMAL HIGH (ref 70–99)

## 2017-11-30 MED ORDER — QUETIAPINE FUMARATE 300 MG PO TABS: 300.0000 mg | ORAL_TABLET | Freq: Every day | ORAL | 1 refills | Status: DC

## 2017-11-30 MED ORDER — SODIUM CHLORIDE 0.9 % IV BOLUS
250.0000 mL | Freq: Once | INTRAVENOUS | Status: AC
  Administered 2017-11-30: 04:00:00 250 mL via INTRAVENOUS

## 2017-11-30 MED FILL — ?QUETIAPINE FUMARA 300MG TA: 300 | 30 days supply | Qty: 30 | Fill #0

## 2017-11-30 NOTE — Discharge Summary (Addendum)
Discharge Summary    Patient ID: Lindsay Rivera,  MRN: [id number], DOB/AGE: January 06, 1961 57 y.o.  Admit date: 11/27/2017 Discharge date: 11/30/2017  Primary Care Provider: Antony Rivera Primary Cardiologist: Dr. Acie Rivera   Discharge Diagnoses    Active Problems:   Chest pain with moderate risk for cardiac etiology   Diabetes mellitus due to underlying condition without complications [facility])   Essential hypertension   Hyperlipidemia   Allergies Allergies  Allergen Reactions  . Penicillins Swelling    Has patient had a PCN reaction causing immediate rash, facial/tongue/throat swelling, SOB or lightheadedness with hypotension: No Has patient had a PCN reaction causing severe rash involving mucus membranes or skin necrosis: No Has patient had a PCN reaction that required hospitalization No Has patient had a PCN reaction occurring within the last 10 years: No If all of the above answers are "NO", then may proceed with Cephalosporin use.   . Latex Rash    Diagnostic Studies/Procedures    Cath: 11/29/17    Previously placed Ost LAD to Prox LAD stent (unknown type) is widely patent.  2nd Mrg lesion is 50% stenosed.  Mid Cx to Dist Cx lesion is 50% stenosed.  Mid LAD to Dist LAD lesion is 40% stenosed.  Mid RCA to Dist RCA lesion is 100% stenosed.  SVG graft was visualized by angiography and is normal in caliber.  SVG graft was visualized by angiography and is normal in caliber.  LIMA graft was visualized by angiography and is small.  Mid Graft to Dist Graft lesion is 100% stenosed.  The left ventricular systolic function is normal.  LV end diastolic pressure is normal.  The left ventricular ejection fraction is 55-65% by visual estimate.   1. Single vessel occlusive CAD. 100% RCA after the RV marginal branch. The stent in the ostial LAD is patent. 2. Atretic LIMA to the LAD 3. Patent free RIMA to the diagonal 4. Patent SVG to the distal RCA 5. Normal LV  function 6. Normal LVEDP  Plan: medical management.  Recommend Aspirin 38m daily for moderate CAD. _____________   History of Present Illness     57y.o. female with a hx of CAD s/p CABG x 3 and PCI afterwards to ostial LAD, DM, hypertension, CVA, hypothyroidism, bipolar disorder, hyperlipidemia, HSV on Valtrex, chronic diastolic heart failure and noncompliance who presented with chest pain.  Hx ofcoronary artery disease status [place] had coronary bypass grafting 3 at that time. She had cardiac catheterization performed by Dr. KClaiborne Billings3/10/17 notable for an atretic LIMA to the LAD with a high-grade ostial LAD stenosis which underwent stenting. The vein to the diagonal branch was patent as was the vein to the distal RCA. She had normal LV function.  Patient has a history of exertional angina frequently. Admitted 09/2016 for chest pain concerning for unstable angina. She was ruled out. EKG without acute changes.   She was doing well on cardiac standpoint during my last evaluation December 2018. Decline monitor for palpitation.  Planned for outpatient stress test as well for atypical type chest pain.   [name] from PCP office for evaluation of chest pain and elevated blood pressure.  Patient reported noncompliance with medication for at least 3 months.  She did not know which about which medications she should be taking. Prior note from PCP indicated patient had been noncompliant for a long period of time.  Patient stated that she had intermittent chest pain  for past few months.  Occurred with or without exertion. Never required nitro.  For past 3 days prior to admission she had left arm pain with numbness radiating to her chest. Felt like prior MI but never took any nitro.  She also had nausea and diarrhea.  Denied orthopnea, PND or melena.  Continued to have intermittent palpitation without syncope.  EKG showed sinus rhythm with  lateral T wave inversion which was present during last admission 09/2016 and 04/2015.  Point-of-care troponin 0.07.  Electrolyte and serum creatinine are normal.  Chest x-ray without acute abnormality.  Her chest pain improved minimally after sublingual nitroglycerin x3 in ER. Given her symptoms he was admitted for further work up.   Hospital Course     Troponins cycled and remained negative. Underwent a myoview which was mildy abnormal with possible ischemia vs artifact. Given she was still having intermittent episodes of chest pain, she was taken for cardiac cath noted above. Cath showed atretic LIMA-LAD, patent RIMA to the diag, SVG to dRCA. Normal EF and LVEDP. Plan to continue with medical management of CAD. Once restarted on her blood pressure medications her pressure improved significantly. Noted to be very lethargic in the mornings, and therefore her Seroquel was reduced back from 635m in the evening to 3041m   General: Well developed, well nourished, female appearing in no acute distress. Head: Normocephalic, atraumatic.  Neck: Supple without bruits, JVD. Lungs:  Resp regular and unlabored, CTA. Heart: RRR, S1, S2, no murmur; no rub. Abdomen: Soft, non-tender, non-distended with normoactive bowel sounds.  Extremities: No clubbing, cyanosis, edema. Distal pedal pulses are 2+ bilaterally. L radial cath site stable without bruising or hematoma Neuro: Alert and oriented X 3. Moves all extremities spontaneously. Psych: Normal affect.  DeALBIE BAZINas seen by Dr. CoBurt Knacknd determined stable for discharge home. Follow up in the office has been arranged. Medications are listed below.   _____________  Discharge Vitals Blood pressure 103/64, pulse 66, temperature 97.7 F (36.5 C), temperature source Oral, resp. rate 14, height 5' 3"  (1.6 m), weight 74 kg, SpO2 100 %.  Filed Weights   11/28/17 2030 11/30/17 0316  Weight: 73.4 kg 74 kg    Labs & Radiologic Studies    CBC Recent  Labs    11/27/17 1526 11/28/17 0611  WBC 8.9 8.8  HGB 14.0 12.8  HCT 43.8 40.5  MCV 89.8 89.8  PLT 369 32163 Basic Metabolic Panel Recent Labs    11/28/17 0611 11/29/17 0925  NA 140 142  K 3.1* 3.8  CL 106 109  CO2 24 28  GLUCOSE 100* 104*  BUN 16 12  CREATININE 0.91 0.82  CALCIUM 9.2 9.1   Liver Function Tests No results for input(s): AST, ALT, ALKPHOS, BILITOT, PROT, ALBUMIN in the last 72 hours. No results for input(s): LIPASE, AMYLASE in the last 72 hours. Cardiac Enzymes Recent Labs    11/27/17 1526 11/28/17 0040 11/28/17 0611  TROPONINI <0.03 <0.03 <0.03   BNP Invalid input(s): POCBNP D-Dimer No results for input(s): DDIMER in the last 72 hours. Hemoglobin A1C No results for input(s): HGBA1C in the last 72 hours. Fasting Lipid Panel Recent Labs    11/28/17 0611  CHOL 173  HDL 49  LDLCALC 99  TRIG 126  CHOLHDL 3.5   Thyroid Function Tests Recent Labs    11/27/17 1526  TSH 37.385*   _____________  Dg Chest 2 View  Result Date: 11/27/2017 CLINICAL DATA:  Chest pain.  Hypertension. EXAM:  CHEST - 2 VIEW COMPARISON:  September 20, 2016 FINDINGS: There is no edema or consolidation. Heart size and pulmonary vascularity are normal. No adenopathy. Patient is status post coronary artery bypass grafting. An apparent pacemaker lead is seen slightly to the left of midline anteriorly. There is aortic atherosclerosis. There is degenerative change in each shoulder and in the thoracic spine. IMPRESSION: No edema or consolidation. Heart size normal. Areas of postoperative change. There is aortic atherosclerosis. Aortic Atherosclerosis (ICD10-I70.0). Electronically Signed   By: Lowella Grip III M.D.   On: 11/27/2017 11:37   Nm Myocar Multi W/spect W/wall Motion / Ef  Result Date: 11/28/2017  There was no ST segment deviation noted during stress.  T wave inversion was noted during stress in the I and aVL leads, beginning at 0 minutes of stress. T wave inversion  persisted.  There is a small defect of mild severity present in the basal inferolateral and mid inferolateral location. The defect is reversible. Likely related to attenuation artifact but cannot rule out a subtle area of ischemia.  There is a small defect of mild severity present in the mid anterior and apical anterior location. The defect is partially reversible. This is likely related to variations in breast attenuation artifact but cannot rule out a small area of ischemia.  The left ventricular ejection fraction is normal (55-65%).  Nuclear stress EF: 65%.  This is a low to intermediate risk study but poor quality. There is significant extracardiac uptake as well as breast and diaphragmatic attenuation. Counts on stress images are reduced in all areas compared to rest images making interpretation difficult.    Disposition   Pt is being discharged home today in good condition.  Follow-up Plans & Appointments    Follow-up Information    Lindsay Blackbird, MD Follow up on 01/15/2018.   Specialty:  Family Medicine Why:  @ 8:50 am- appointment previously scheduled.  Contact information: [place] [address] [place] Follow up on 12/06/2017.   Why:  10:30 for hospital follow up Contact information: [address] [place] [address] [phone]         Discharge Instructions    Diet - low sodium heart healthy   Complete by:  As directed    Discharge instructions   Complete by:  As directed    Radial Site Care Refer to this sheet in the next few weeks. These instructions provide you with information on caring for yourself after your procedure. Your caregiver may also give you more specific instructions. Your treatment has been planned according to current medical practices, but problems sometimes occur. Call your caregiver if you have any problems or questions after your procedure. HOME  CARE INSTRUCTIONS You may shower the day after the procedure.Remove the bandage (dressing) and gently wash the site with plain soap and water.Gently pat the site dry.  Do not apply powder or lotion to the site.  Do not submerge the affected site in water for 3 to 5 days.  Inspect the site at least twice daily.  Do not flex or bend the affected arm for 24 hours.  No lifting over 5 pounds (2.3 kg) for 5 days after your procedure.  Do not drive home if you are discharged the same day of the procedure. Have someone else drive you.  You may drive 24 hours after the procedure unless otherwise instructed by your caregiver.  What  to expect: Any bruising will usually fade within 1 to 2 weeks.  Blood that collects in the tissue (hematoma) may be painful to the touch. It should usually decrease in size and tenderness within 1 to 2 weeks.  SEEK IMMEDIATE MEDICAL CARE IF: You have unusual pain at the radial site.  You have redness, warmth, swelling, or pain at the radial site.  You have drainage (other than a small amount of blood on the dressing).  You have chills.  You have a fever or persistent symptoms for more than 72 hours.  You have a fever and your symptoms suddenly get worse.  Your arm becomes pale, cool, tingly, or numb.  You have heavy bleeding from the site. Hold pressure on the site.   Increase activity slowly   Complete by:  As directed       Discharge Medications     Medication List    STOP taking these medications   clotrimazole 1 % cream Commonly known as:  LOTRIMIN   ketoconazole 2 % cream Commonly known as:  NIZORAL   methocarbamol 500 MG tablet Commonly known as:  ROBAXIN   PARoxetine 10 MG tablet Commonly known as:  PAXIL     TAKE these medications   albuterol 108 (90 Base) MCG/ACT inhaler Commonly known as:  PROVENTIL HFA;VENTOLIN HFA INHALE 2 PUFFS INTO THE LUNGS EVERY 6 HOURS AS NEEDED FOR WHEEZING.   amLODipine 5 MG tablet Commonly known as:   NORVASC Take 1 tablet (5 mg total) by mouth daily.   aspirin EC 81 MG tablet Take 1 tablet (81 mg total) by mouth daily.   atorvastatin 80 MG tablet Commonly known as:  LIPITOR Take 1 tablet (80 mg total) by mouth at bedtime.   buPROPion 150 MG 24 hr tablet Commonly known as:  WELLBUTRIN XL TAKE 1 TABLET (150 MG TOTAL) BY MOUTH EVERY EVENING.   Calcium Carbonate-Vitamin D 600-400 MG-UNIT chew tablet Chew 1 tablet by mouth daily. Reported on 03/25/2015   carvedilol 3.125 MG tablet Commonly known as:  COREG Take 1 tablet (3.125 mg total) by mouth 2 (two) times daily.   cetirizine 10 MG tablet Commonly known as:  ZYRTEC Take 1 tablet (10 mg total) by mouth at bedtime. As needed for nasal congestion   clopidogrel 75 MG tablet Commonly known as:  PLAVIX Take 1 tablet (75 mg total) by mouth daily. Take 4 tablets the first day then 1 tablet daily thereafter   diazepam 5 MG tablet Commonly known as:  VALIUM Take 5 mg by mouth 2 (two) times daily as needed for anxiety. Reported on 07/09/2015   famotidine 20 MG tablet Commonly known as:  PEPCID Take 20 mg by mouth daily.   fluticasone 50 MCG/ACT nasal spray Commonly known as:  FLONASE Place 2 sprays into both nostrils daily.   glucose blood test strip Use as instructed once per day to check blood sugar   hydrochlorothiazide 25 MG tablet Commonly known as:  HYDRODIURIL TAKE 1 TABLET BY MOUTH DAILY   hydrOXYzine 25 MG tablet Commonly known as:  ATARAX/VISTARIL TAKE 1 TABLET BY MOUTH AT BEDTIME.   levothyroxine 125 MCG tablet Commonly known as:  SYNTHROID, LEVOTHROID Take 1 tablet (125 mcg total) by mouth daily.   lidocaine 5 % Commonly known as:  LIDODERM Place 1 patch onto the skin daily as needed (pain). Remove & Discard patch within 12 hours or as directed by MD   metFORMIN 500 MG 24 hr tablet Commonly known as:  GLUCOPHAGE-XR Take 1 tablet (500 mg total) by mouth daily with breakfast.   nitroGLYCERIN 0.4 MG SL  tablet Commonly known as:  NITROSTAT PLACE 1 TABLET UNDER THE TONGUE EVERY 5 MINS AS NEEDED FOR CHEST PAIN   OLANZapine 20 MG tablet Commonly known as:  ZYPREXA Take 20 mg by mouth at bedtime.   pantoprazole 40 MG tablet Commonly known as:  PROTONIX Take 1 tablet (40 mg total) by mouth daily.   polyethylene glycol packet Commonly known as:  MIRALAX / GLYCOLAX Take 17 g by mouth daily as needed.   potassium chloride 10 MEQ tablet Commonly known as:  K-DUR Take 2 tablets (20 mEq total) by mouth daily.   QUEtiapine 50 MG tablet Commonly known as:  SEROQUEL Take 50 mg by mouth every morning. What changed:  Another medication with the same name was changed. Make sure you understand how and when to take each.   QUEtiapine 300 MG tablet Commonly known as:  SEROQUEL Take 1 tablet (300 mg total) by mouth at bedtime. What changed:    medication strength  how much to take   solifenacin 5 MG tablet Commonly known as:  VESICARE Take 1 tablet (5 mg total) by mouth daily.   TRUE METRIX METER w/Device Kit Use daily to monitor blood sugar   TRUEPLUS LANCETS 28G Misc Use once daily to check blood sugar        Acute coronary syndrome (MI, NSTEMI, STEMI, etc) this admission?: No.   Outstanding Labs/Studies   N/a   Duration of Discharge Encounter   Greater than 30 minutes including physician time.  Signed, Reino Bellis NP-C 11/30/2017, 9:45 AM

## 2017-11-30 NOTE — Progress Notes (Signed)
Pt's BP this am was 85/56. Pt complains of lightheadedness. MD on call updated and ordered 250 ml NS bolus. BP was 97/58 after bolus. Will continue to monitor pt.

## 2017-11-30 NOTE — Progress Notes (Signed)
TR BAND REMOVAL  LOCATION:    left radial  DEFLATED PER PROTOCOL:    Yes.    TIME BAND OFF / DRESSING APPLIED:    2130   SITE UPON ARRIVAL:    Level 0  SITE AFTER BAND REMOVAL:    Level 0  CIRCULATION SENSATION AND MOVEMENT:    Within Normal Limits   Yes.    COMMENTS:   Post TR band instructions given, dsd applied, good cap refill.

## 2017-11-30 NOTE — Care Management Note (Signed)
Case Management Note  Patient Details  Name: Lindsay Rivera MRN: [id number] Date of Birth: [birth year]  Subjective/Objective:   Patient for dc today, NCM spoke with patient to make sure she knows about follow up apt. She states she does and she has her medications already.                   Action/Plan: DC when ready.  Expected Discharge Date:  11/30/17               Expected Discharge Plan:  Home/Self Care  In-House Referral:  NA  Discharge planning Services  CM Consult, Follow-up appt scheduled, Medication Assistance  Post Acute Care Choice:  NA Choice offered to:  NA  DME Arranged:  N/A DME Agency:  NA  HH Arranged:  NA HH Agency:  NA  Status of Service:  Completed, signed off  If discussed at [place] of Stay Meetings, dates discussed:    Additional Comments:  Zenon Mayo, RN 11/30/2017, 11:45 AM

## 2017-12-06 ENCOUNTER — Inpatient Hospital Stay: Payer: Medicaid Other | Admitting: Critical Care Medicine

## 2017-12-08 ENCOUNTER — Ambulatory Visit (INDEPENDENT_AMBULATORY_CARE_PROVIDER_SITE_OTHER): Payer: Self-pay | Admitting: Physician Assistant

## 2017-12-08 ENCOUNTER — Encounter: Payer: Self-pay | Admitting: Physician Assistant

## 2017-12-08 VITALS — BP 110/70 | HR 74 | Ht 63.0 in | Wt 161.0 lb

## 2017-12-08 DIAGNOSIS — K5900 Constipation, unspecified: Secondary | ICD-10-CM

## 2017-12-08 DIAGNOSIS — Z86010 Personal history of colonic polyps: Secondary | ICD-10-CM

## 2017-12-08 DIAGNOSIS — R140 Abdominal distension (gaseous): Secondary | ICD-10-CM

## 2017-12-08 NOTE — Patient Instructions (Signed)
Take a Fiber supplement daily. Also take Miralax, 17 grams in 8 oz of water , Start with a full dose and decrease to 1/2 a dose if too much.  You have been scheduled for a CT scan of the abdomen and pelvis at [facility] ([address]---this is in the same building as Press [facility]).   You are scheduled on Wednesday 11-13 at 3:30 PM. You should arrive at 3:15 PM to your appointment time for registration. Please follow the written instructions below on the day of your exam:  WARNING: IF YOU ARE ALLERGIC TO IODINE/X-RAY DYE, PLEASE NOTIFY RADIOLOGY IMMEDIATELY AT [phone]! YOU WILL BE GIVEN A 13 HOUR PREMEDICATION PREP.  1) Do not eat anything after 11:30 am (4 hours prior to your test) 2) You have been given 2 bottles of oral contrast to drink. The solution may taste better if refrigerated, but do NOT add ice or any other liquid to this solution. Shake well before drinking.    Drink 1 bottle of contrast @ 1:30 PM (2 hours prior to your exam)  Drink 1 bottle of contrast @ 2:30 PM (1 hour prior to your exam)  You may take any medications as prescribed with a small amount of water, if necessary. If you take any of the following medications: METFORMIN, GLUCOPHAGE, GLUCOVANCE, AVANDAMET, RIOMET, FORTAMET, [place] MET, JANUMET, GLUMETZA or METAGLIP, you MAY be asked to HOLD this medication 48 hours AFTER the exam.  The purpose of you drinking the oral contrast is to aid in the visualization of your intestinal tract. The contrast solution may cause some diarrhea. Depending on your individual set of symptoms, you may also receive an intravenous injection of x-ray contrast/dye. Plan on being at [facility] for 30 minutes or longer, depending on the type of exam you are having performed.  This test typically takes 30-45 minutes to complete.  If you have any questions regarding your exam or if you need to reschedule, you may call the CT department at [phone] between the  hours of 8:00 am and 5:00 pm, Monday-Friday.  ________________________________________________________________________
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
## 2017-12-11 ENCOUNTER — Ambulatory Visit: Payer: Medicaid Other | Attending: Critical Care Medicine | Admitting: Critical Care Medicine

## 2017-12-11 ENCOUNTER — Encounter: Payer: Self-pay | Admitting: Critical Care Medicine

## 2017-12-11 VITALS — BP 112/78 | HR 72 | Temp 98.8°F | Resp 18 | Ht 63.0 in | Wt 160.0 lb

## 2017-12-11 DIAGNOSIS — Z79899 Other long term (current) drug therapy: Secondary | ICD-10-CM | POA: Insufficient documentation

## 2017-12-11 DIAGNOSIS — I25810 Atherosclerosis of coronary artery bypass graft(s) without angina pectoris: Secondary | ICD-10-CM | POA: Diagnosis not present

## 2017-12-11 DIAGNOSIS — F319 Bipolar disorder, unspecified: Secondary | ICD-10-CM | POA: Diagnosis not present

## 2017-12-11 DIAGNOSIS — Z79890 Hormone replacement therapy (postmenopausal): Secondary | ICD-10-CM | POA: Diagnosis not present

## 2017-12-11 DIAGNOSIS — I2510 Atherosclerotic heart disease of native coronary artery without angina pectoris: Secondary | ICD-10-CM

## 2017-12-11 DIAGNOSIS — I10 Essential (primary) hypertension: Secondary | ICD-10-CM

## 2017-12-11 DIAGNOSIS — Z7982 Long term (current) use of aspirin: Secondary | ICD-10-CM | POA: Insufficient documentation

## 2017-12-11 DIAGNOSIS — I200 Unstable angina: Secondary | ICD-10-CM

## 2017-12-11 DIAGNOSIS — E7800 Pure hypercholesterolemia, unspecified: Secondary | ICD-10-CM

## 2017-12-11 DIAGNOSIS — M79602 Pain in left arm: Secondary | ICD-10-CM | POA: Diagnosis present

## 2017-12-11 DIAGNOSIS — I25110 Atherosclerotic heart disease of native coronary artery with unstable angina pectoris: Secondary | ICD-10-CM

## 2017-12-11 DIAGNOSIS — I25700 Atherosclerosis of coronary artery bypass graft(s), unspecified, with unstable angina pectoris: Secondary | ICD-10-CM

## 2017-12-11 DIAGNOSIS — Z8673 Personal history of transient ischemic attack (TIA), and cerebral infarction without residual deficits: Secondary | ICD-10-CM | POA: Insufficient documentation

## 2017-12-11 DIAGNOSIS — Z7902 Long term (current) use of antithrombotics/antiplatelets: Secondary | ICD-10-CM | POA: Insufficient documentation

## 2017-12-11 DIAGNOSIS — E785 Hyperlipidemia, unspecified: Secondary | ICD-10-CM | POA: Diagnosis not present

## 2017-12-11 DIAGNOSIS — I509 Heart failure, unspecified: Secondary | ICD-10-CM | POA: Insufficient documentation

## 2017-12-11 DIAGNOSIS — F419 Anxiety disorder, unspecified: Secondary | ICD-10-CM | POA: Diagnosis not present

## 2017-12-11 DIAGNOSIS — F17200 Nicotine dependence, unspecified, uncomplicated: Secondary | ICD-10-CM | POA: Insufficient documentation

## 2017-12-11 DIAGNOSIS — I110 Hypertensive heart disease with heart failure: Secondary | ICD-10-CM | POA: Diagnosis not present

## 2017-12-11 DIAGNOSIS — R202 Paresthesia of skin: Secondary | ICD-10-CM | POA: Diagnosis present

## 2017-12-11 DIAGNOSIS — Z7984 Long term (current) use of oral hypoglycemic drugs: Secondary | ICD-10-CM | POA: Diagnosis not present

## 2017-12-11 DIAGNOSIS — I25119 Atherosclerotic heart disease of native coronary artery with unspecified angina pectoris: Secondary | ICD-10-CM

## 2017-12-11 DIAGNOSIS — E119 Type 2 diabetes mellitus without complications: Secondary | ICD-10-CM | POA: Insufficient documentation

## 2017-12-11 DIAGNOSIS — Z9861 Coronary angioplasty status: Secondary | ICD-10-CM

## 2017-12-11 DIAGNOSIS — K219 Gastro-esophageal reflux disease without esophagitis: Secondary | ICD-10-CM | POA: Diagnosis not present

## 2017-12-11 DIAGNOSIS — Z880 Allergy status to penicillin: Secondary | ICD-10-CM | POA: Insufficient documentation

## 2017-12-11 MED ORDER — LORAZEPAM 1 MG PO TABS: 1.0000 mg | ORAL_TABLET | Freq: Once | ORAL | 0 refills | Status: DC

## 2017-12-11 MED ORDER — LORAZEPAM 1 MG PO TABS: 1.0000 mg | ORAL_TABLET | Freq: Once | ORAL | 0 refills | Status: AC

## 2017-12-11 MED FILL — ?VALACYCLOVIR HCL 500MG TAB: 500 | 3 days supply | Qty: 6 | Fill #2

## 2017-12-11 NOTE — Assessment & Plan Note (Signed)
LDL levels at goal

## 2017-12-11 NOTE — Assessment & Plan Note (Signed)
See note involving coronary disease involving bypass graft

## 2017-12-11 NOTE — Assessment & Plan Note (Signed)
See note involving coronary artery disease involving bypass graft

## 2017-12-11 NOTE — Progress Notes (Signed)
Subjective:    Patient ID: Lindsay Rivera, female    DOB: 1960-08-07, 57 y.o.   MRN: [id number]  57 y.o.F    Chest Pain   This is a recurrent problem. The current episode started more than 1 month ago. The onset quality is gradual. The problem occurs intermittently. The problem has been gradually improving (Pt still has tingling in fingers and pain in left arm,  no chest pain ). The pain is at a severity of 6/10. The pain is moderate. The quality of the pain is described as sharp (tingling). The pain radiates to the left arm. Associated symptoms include a cough, diaphoresis, dizziness, exertional chest pressure, irregular heartbeat, malaise/fatigue, numbness, palpitations, shortness of breath and weakness. Pertinent negatives include no nausea, syncope or vomiting. The cough is non-productive.   Past Medical History:  Diagnosis Date  . Anemia   . Anxiety   . Arthritis    "lower back" (04/17/2015)  . Asthma   . Bipolar disorder ([place])   . CHF (congestive heart failure) ([place])   . Chronic lower back pain   . Constipation   . CVA (cerebral vascular accident) ([place]) 2010   denies residual on 04/17/2015  . Depression   . GERD (gastroesophageal reflux disease)   . Hyperlipidemia   . Hypertension   . Hypothyroidism   . MI (myocardial infarction) ([place]) 02/08/2013  . Migraine    "q 3-4 months" (04/17/2015)  . Type II diabetes mellitus ([place])    "diet controlled" (04/17/2015)     Family History  Problem Relation Age of Onset  . Heart disease Father   . Hypertension Father   . Cancer Father        colon cancer   . Colon cancer Father   . Heart attack Father   . Colon cancer Maternal Grandfather   . Diabetes Mother   . Hypertension Mother   . Stomach cancer Mother      Social History   Socioeconomic History  . Marital status: Divorced    Spouse name: Not on file  . Number of children: Not on file  . Years of education: Not on file  . Highest education level: Not on file    Occupational History  . Not on file  Social Needs  . Financial resource strain: Not on file  . Food insecurity:    Worry: Not on file    Inability: Not on file  . Transportation needs:    Medical: Not on file    Non-medical: Not on file  Tobacco Use  . Smoking status: Former Smoker    Packs/day: 0.12    Years: 38.00    Pack years: 4.56    Types: Cigarettes    Last attempt to quit: 02/08/2013    Years since quitting: 4.8  . Smokeless tobacco: Never Used  Substance and Sexual Activity  . Alcohol use: No    Alcohol/week: 0.0 standard drinks    Comment: 04/17/2015 "I'll have a glass of wine on birthdays, new year's, etc."  . Drug use: Yes    Types: "Crack" cocaine, Marijuana    Comment: "used crack cocaine in the 1980s"  . Sexual activity: Not Currently  Lifestyle  . Physical activity:    Days per week: Not on file    Minutes per session: Not on file  . Stress: Not on file  Relationships  . Social connections:    Talks on phone: Not on file    Gets together: Not on file  Attends religious service: Not on file    Active member of club or organization: Not on file    Attends meetings of clubs or organizations: Not on file    Relationship status: Not on file  . Intimate partner violence:    Fear of current or ex partner: Not on file    Emotionally abused: Not on file    Physically abused: Not on file    Forced sexual activity: Not on file  Other Topics Concern  . Not on file  Social History Narrative   Current smoker     Allergies  Allergen Reactions  . Penicillins Swelling    Has patient had a PCN reaction causing immediate rash, facial/tongue/throat swelling, SOB or lightheadedness with hypotension: No Has patient had a PCN reaction causing severe rash involving mucus membranes or skin necrosis: No Has patient had a PCN reaction that required hospitalization No Has patient had a PCN reaction occurring within the last 10 years: No If all of the above answers are  "NO", then may proceed with Cephalosporin use.   . Latex Rash     Outpatient Medications Prior to Visit  Medication Sig Dispense Refill  . albuterol (VENTOLIN HFA) 108 (90 Base) MCG/ACT inhaler INHALE 2 PUFFS INTO THE LUNGS EVERY 6 HOURS AS NEEDED FOR WHEEZING. 1 Inhaler 2  . amLODipine (NORVASC) 5 MG tablet Take 1 tablet (5 mg total) by mouth daily. 90 tablet 3  . aspirin EC 81 MG tablet Take 1 tablet (81 mg total) by mouth daily.    Marland Kitchen atorvastatin (LIPITOR) 80 MG tablet Take 1 tablet (80 mg total) by mouth at bedtime. 30 tablet 11  . Blood Glucose Monitoring Suppl (TRUE METRIX METER) w/Device KIT Use daily to monitor blood sugar 1 kit 0  . buPROPion (WELLBUTRIN XL) 150 MG 24 hr tablet TAKE 1 TABLET (150 MG TOTAL) BY MOUTH EVERY EVENING. 30 tablet 2  . Calcium Carbonate-Vitamin D 600-400 MG-UNIT per chew tablet Chew 1 tablet by mouth daily. Reported on 03/25/2015    . carvedilol (COREG) 3.125 MG tablet Take 1 tablet (3.125 mg total) by mouth 2 (two) times daily. 180 tablet 3  . cetirizine (ZYRTEC) 10 MG tablet Take 1 tablet (10 mg total) by mouth at bedtime. As needed for nasal congestion 30 tablet 11  . clopidogrel (PLAVIX) 75 MG tablet Take 1 tablet (75 mg total) by mouth daily. Take 4 tablets the first day then 1 tablet daily thereafter 94 tablet 3  . famotidine (PEPCID) 20 MG tablet Take 20 mg by mouth daily.    . fluticasone (FLONASE) 50 MCG/ACT nasal spray Place 2 sprays into both nostrils daily. 16 g 3  . glucose blood (TRUE METRIX BLOOD GLUCOSE TEST) test strip Use as instructed once per day to check blood sugar 100 each 3  . hydrochlorothiazide (HYDRODIURIL) 25 MG tablet TAKE 1 TABLET BY MOUTH DAILY 30 tablet 2  . hydrOXYzine (ATARAX/VISTARIL) 25 MG tablet TAKE 1 TABLET BY MOUTH AT BEDTIME. (Patient taking differently: Take 25 mg by mouth at bedtime. ) 30 tablet 0  . levothyroxine (SYNTHROID, LEVOTHROID) 125 MCG tablet Take 1 tablet (125 mcg total) by mouth daily. 30 tablet 6  .  metFORMIN (GLUCOPHAGE XR) 500 MG 24 hr tablet Take 1 tablet (500 mg total) by mouth daily with breakfast. 30 tablet 5  . nitroGLYCERIN (NITROSTAT) 0.4 MG SL tablet PLACE 1 TABLET UNDER THE TONGUE EVERY 5 MINS AS NEEDED FOR CHEST PAIN 25 tablet 11  . OLANZapine (  ZYPREXA) 20 MG tablet Take 20 mg by mouth at bedtime.    . pantoprazole (PROTONIX) 40 MG tablet Take 1 tablet (40 mg total) by mouth daily. 30 tablet 11  . polyethylene glycol (MIRALAX / GLYCOLAX) packet Take 17 g by mouth daily as needed.    . potassium chloride (K-DUR) 10 MEQ tablet Take 2 tablets (20 mEq total) by mouth daily. 60 tablet 11  . QUEtiapine (SEROQUEL) 300 MG tablet Take 1 tablet (300 mg total) by mouth at bedtime. 30 tablet 1  . solifenacin (VESICARE) 5 MG tablet Take 1 tablet (5 mg total) by mouth daily. 30 tablet 5  . TRUEPLUS LANCETS 28G MISC Use once daily to check blood sugar 100 each 3  . diazepam (VALIUM) 5 MG tablet Take 5 mg by mouth 2 (two) times daily as needed for anxiety. Reported on 07/09/2015    . valACYclovir (VALTREX) 500 MG tablet Take 1 tablet by mouth daily as needed.  11  . lidocaine (LIDODERM) 5 % Place 1 patch onto the skin daily as needed (pain). Remove & Discard patch within 12 hours or as directed by MD    . QUEtiapine (SEROQUEL) 50 MG tablet Take 50 mg by mouth every morning.      No facility-administered medications prior to visit.       Review of Systems  Constitutional: Positive for diaphoresis and malaise/fatigue.  Respiratory: Positive for cough and shortness of breath.   Cardiovascular: Positive for chest pain and palpitations. Negative for syncope.  Gastrointestinal: Negative for nausea and vomiting.  Neurological: Positive for dizziness, weakness and numbness.       Objective:   Physical Exam Vitals:   12/11/17 1428  BP: 112/78  Pulse: 72  Resp: 18  Temp: 98.8 F (37.1 C)  TempSrc: Oral  SpO2: 99%  Weight: 160 lb (72.6 kg)  Height: _0  (1.6 m)    Gen: Pleasant,  anxious female in no acute distress  ENT: No lesions,  mouth clear,  oropharynx clear, no postnasal drip  Neck: No JVD, no TMG, no carotid bruits  Lungs: No use of accessory muscles, no dullness to percussion, clear without rales or rhonchi  Cardiovascular: RRR, heart sounds normal, no murmur or gallops, no peripheral edema  Abdomen: soft and NT, no HSM,  BS normal Minimal hematomas bilaterally in the lower abdomen at site of previous Lovenox injections  Musculoskeletal: No deformities, no cyanosis or clubbing  Neuro: alert, non focal  Skin: Warm, no lesions or rashes  No results found.  Cath Cath: 11/29/17    Previously placed Ost LAD to Prox LAD stent (unknown type) is widely patent.  2nd Mrg lesion is 50% stenosed.  Mid Cx to Dist Cx lesion is 50% stenosed.  Mid LAD to Dist LAD lesion is 40% stenosed.  Mid RCA to Dist RCA lesion is 100% stenosed.  SVG graft was visualized by angiography and is normal in caliber.  SVG graft was visualized by angiography and is normal in caliber.  LIMA graft was visualized by angiography and is small.  Mid Graft to Dist Graft lesion is 100% stenosed.  The left ventricular systolic function is normal.  LV end diastolic pressure is normal.  The left ventricular ejection fraction is 55-65% by visual estimate.  1. Single vessel occlusive CAD. 100% RCA after the RV marginal branch. The stent in the ostial LAD is patent. 2. Atretic LIMA to the LAD 3. Patent free RIMA to the diagonal 4. Patent SVG to the distal  RCA 5. Normal LV function 6. Normal LVEDP  CBC Latest Ref Rng & Units 11/28/2017 11/27/2017 11/27/2017  WBC 4.0 - 10.5 K/uL 8.8 8.9 8.2  Hemoglobin 12.0 - 15.0 g/dL 12.8 14.0 15.5(H)  Hematocrit 36.0 - 46.0 % 40.5 43.8 53.9(H)  Platelets 150 - 400 K/uL 324 369 288   BMP Latest Ref Rng & Units 11/29/2017 11/28/2017 11/27/2017  Glucose 70 - 99 mg/dL 104(H) 100(H) -  BUN 6 - 20 mg/dL 12 16 -  Creatinine 0.44 - 1.00  mg/dL 0.82 0.91 0.85  BUN/Creat Ratio 9 - 23 - - -  Sodium 135 - 145 mmol/L 142 140 -  Potassium 3.5 - 5.1 mmol/L 3.8 3.1(L) -  Chloride 98 - 111 mmol/L 109 106 -  CO2 22 - 32 mmol/L 28 24 -  Calcium 8.9 - 10.3 mg/dL 9.1 9.2 -   Lipid Panel     Component Value Date/Time   CHOL 173 11/28/2017 0611   CHOL 269 (H) 10/16/2017 1045   TRIG 126 11/28/2017 0611   HDL 49 11/28/2017 0611   HDL 82 10/16/2017 1045   CHOLHDL 3.5 11/28/2017 0611   VLDL 25 11/28/2017 0611   LDLCALC 99 11/28/2017 0611   LDLCALC 169 (H) 10/16/2017 1045    Echo reviewed  EF 55-65%  ECG reviewed  Trop Neg.                 Assessment & Plan:  I personally reviewed all images and lab data in the [facility] system as well as any outside material available during this office visit and agree with the  radiology impressions.   Coronary artery disease involving coronary bypass graft of native heart with unstable angina pectoris ([place]) Coronary artery disease involving coronary bypass graft as well as native coronary arteries with unstable angina now improved Note cardiology recommended medication management only  Plan Continue aspirin daily Continue aggressive statin therapy Continue hypertensive medications as prescribed Continue Plavix She is encouraged to follow-up with cardiology   CAD involving native coronary artery of native heart with [place] See note involving coronary disease involving bypass graft  CAD -S/P LM DES 04/17/15 See note involving coronary artery disease involving bypass graft  Hyperlipidemia LDL levels at goal  Essential hypertension Hypertension currently controlled Continue current antihypertensive regimen  Note I did give the patient Ativan 1 mg 1 tablet only to take prior to her upcoming CT scan of her abdomen work-up  Note this patient will follow up with [facility] psychiatry for her other psychiatric medications  Note I made extensive medication reconciliation so the patient's  medication list based on external medications and prescribed medicines pre-and post hospital  Diagnoses and all orders for this visit:  Coronary artery disease involving native coronary artery of native heart with angina pectoris [facility])  Essential hypertension  Unstable angina ([place])  CAD in native artery  Pure hypercholesterolemia  Coronary artery disease involving coronary bypass graft of native heart with unstable angina pectoris ([place])  CAD involving native coronary artery of native heart with [place]  CAD -S/P LM DES 04/17/15  Other orders -     Discontinue: LORazepam (ATIVAN) 1 MG tablet; Take 1 tablet (1 mg total) by mouth once for 1 dose. Take day of CT Scan in the morning 12/20/17 -     Discontinue: LORazepam (ATIVAN) 1 MG tablet; Take 1 tablet (1 mg total) by mouth once for 1 dose. Take day of CT Scan in the morning 12/20/17 -     LORazepam (  ATIVAN) 1 MG tablet; Take 1 tablet (1 mg total) by mouth once for 1 dose. Take day of CT Scan in the morning 12/20/17

## 2017-12-11 NOTE — Patient Instructions (Addendum)
No change in medications Stop taking Lidoderm patch Do not take the 50 mg Seroquel for now A prescription for Ativan 1 mg to take the morning of your abdominal CT scan was given Follow-up with your psychiatry physician at [facility] Return with to primary care 1 month Keep next appointment with cardiology

## 2017-12-11 NOTE — Assessment & Plan Note (Signed)
Hypertension currently controlled Continue current antihypertensive regimen

## 2017-12-11 NOTE — Assessment & Plan Note (Signed)
Coronary artery disease involving coronary bypass graft as well as native coronary arteries with unstable angina now improved Note cardiology recommended medication management only  Plan Continue aspirin daily Continue aggressive statin therapy Continue hypertensive medications as prescribed Continue Plavix She is encouraged to follow-up with cardiology

## 2017-12-20 ENCOUNTER — Ambulatory Visit (INDEPENDENT_AMBULATORY_CARE_PROVIDER_SITE_OTHER)
Admission: RE | Admit: 2017-12-20 | Discharge: 2017-12-20 | Disposition: A | Discharge status: Home / Self Care | Payer: Self-pay | Source: Ambulatory Visit | Attending: Physician Assistant | Admitting: Physician Assistant

## 2017-12-20 DIAGNOSIS — R140 Abdominal distension (gaseous): Secondary | ICD-10-CM

## 2017-12-20 DIAGNOSIS — K5900 Constipation, unspecified: Secondary | ICD-10-CM

## 2017-12-20 MED ORDER — IOPAMIDOL (ISOVUE-300) INJECTION 61%
100.0000 mL | Freq: Once | INTRAVENOUS | Status: AC | PRN
  Administered 2017-12-20: 100 mL via INTRAVENOUS

## 2017-12-21 MED FILL — OLANZapine 15 MG TABS: 15 | 30 days supply | Qty: 60 | Fill #0

## 2017-12-21 MED FILL — FLUoxetine HCL 10 MG CAPS: 10 | 30 days supply | Qty: 30 | Fill #0

## 2017-12-28 ENCOUNTER — Encounter: Payer: Self-pay | Admitting: Cardiovascular Disease

## 2017-12-28 ENCOUNTER — Ambulatory Visit (INDEPENDENT_AMBULATORY_CARE_PROVIDER_SITE_OTHER): Payer: Self-pay | Admitting: Cardiovascular Disease

## 2017-12-28 VITALS — BP 106/70 | HR 64 | Ht 63.0 in | Wt 163.0 lb

## 2017-12-28 DIAGNOSIS — Z951 Presence of aortocoronary bypass graft: Secondary | ICD-10-CM

## 2017-12-28 DIAGNOSIS — I2510 Atherosclerotic heart disease of native coronary artery without angina pectoris: Secondary | ICD-10-CM

## 2017-12-28 DIAGNOSIS — E782 Mixed hyperlipidemia: Secondary | ICD-10-CM

## 2017-12-28 MED ORDER — ROSUVASTATIN CALCIUM 20 MG PO TABS: 20.0000 mg | ORAL_TABLET | Freq: Every day | ORAL | 3 refills | Status: DC

## 2017-12-28 MED FILL — ?QUETIAPINE FUMARA 300MG TA: 300 | 30 days supply | Qty: 30 | Fill #1

## 2017-12-28 MED FILL — ?CLOPIDOGREL 75MG TA: 75 | 30 days supply | Qty: 30 | Fill #2

## 2017-12-28 MED FILL — POTASSIUM CL ER 10 MEQ TAB: 10 | 30 days supply | Qty: 60 | Fill #2

## 2017-12-28 MED FILL — HYDROCHLOROTHIAZIDE 25 MG T: 25 | 30 days supply | Qty: 30 | Fill #1

## 2017-12-28 MED FILL — BUPROPION HCL XL 150 MG TAB: 150 | 30 days supply | Qty: 30 | Fill #1

## 2017-12-28 MED FILL — ROSUVASTATIN CALCIUM 20 MG: 20 | 30 days supply | Qty: 30 | Fill #0

## 2017-12-28 MED FILL — METFORMIN HCL ER 500 MG TAB: 500 | 30 days supply | Qty: 30 | Fill #2

## 2017-12-28 MED FILL — ?CARVEDILOL 3.125 MG TABLET: 3.125 | 30 days supply | Qty: 60 | Fill #3

## 2017-12-28 MED FILL — ACYCLOVIR 400 MG TABLET: 400 | 5 days supply | Qty: 20 | Fill #3

## 2017-12-28 MED FILL — NITROGLYCERIN 0.4 MG TAB SL: 0.4 | 25 days supply | Qty: 25 | Fill #2

## 2017-12-28 MED FILL — ?PANTOPRAZOLE SOD DR 40MG T: 40 | 30 days supply | Qty: 30 | Fill #2

## 2017-12-28 MED FILL — ?LEVOTHYROXINE 125 MCG TABL: 125 | 30 days supply | Qty: 30 | Fill #2
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
## 2017-12-28 NOTE — Patient Instructions (Addendum)
Medication Instructions:  Your physician has recommended you make the following change in your medication:   STOP Atorvastatin (Lipitor) START Rosuvastatin (Crestor) 20 mg once daily  If you need a refill on your cardiac medications before your next appointment, please call your pharmacy.   Lab work: Your physician recommends that you return for lab work in: 3 months You will need to FAST for this appointment - nothing to eat or drink after midnight the night before except water.   If you have labs (blood work) drawn today and your tests are completely normal, you will receive your results only by: Marland Kitchen MyChart Message (if you have MyChart) OR . A paper copy in the mail If you have any lab test that is abnormal or we need to change your treatment, we will call you to review the results.   Testing/Procedures: None Ordered    Follow-Up: You have been referred to [facility] ([facility] for removal of sternal wires   At [facility], you and your health needs are our priority.  As part of our continuing mission to provide you with exceptional heart care, we have created designated Provider Care Teams.  These Care Teams include your primary Cardiologist (physician) and Advanced Practice Providers (APPs -  Physician Assistants and Nurse Practitioners) who all work together to provide you with the care you need, when you need it. You will need a follow up appointment in:  6 months.  Please call our office 2 months in advance to schedule this appointment.  You may see Mertie Moores, MD or one of the following Advanced Practice Providers on your designated Care Team: Richardson Dopp, PA-C [place], [place] . Daune Perch, NP

## 2018-01-03 ENCOUNTER — Encounter: Payer: Self-pay | Admitting: Surgery

## 2018-01-03 ENCOUNTER — Other Ambulatory Visit: Payer: Self-pay | Admitting: *Deleted

## 2018-01-03 ENCOUNTER — Institutional Professional Consult (permissible substitution) (INDEPENDENT_AMBULATORY_CARE_PROVIDER_SITE_OTHER): Payer: No Typology Code available for payment source | Admitting: Surgery

## 2018-01-03 ENCOUNTER — Other Ambulatory Visit: Payer: Self-pay

## 2018-01-03 VITALS — BP 108/72 | HR 74 | Resp 16 | Ht 63.0 in | Wt 160.0 lb

## 2018-01-03 DIAGNOSIS — R0789 Other chest pain: Secondary | ICD-10-CM

## 2018-01-03 DIAGNOSIS — Z951 Presence of aortocoronary bypass graft: Secondary | ICD-10-CM

## 2018-01-03 DIAGNOSIS — I25110 Atherosclerotic heart disease of native coronary artery with unstable angina pectoris: Secondary | ICD-10-CM
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
## 2018-01-08 ENCOUNTER — Emergency Department (HOSPITAL_COMMUNITY): Payer: Medicaid Other

## 2018-01-08 ENCOUNTER — Other Ambulatory Visit: Payer: Self-pay

## 2018-01-08 ENCOUNTER — Inpatient Hospital Stay (HOSPITAL_COMMUNITY)
Admission: EM | Admit: 2018-01-08 | Discharge: 2018-01-14 | DRG: 982 | Disposition: A | Discharge status: Home Health | Payer: Medicaid Other | Attending: Internal Medicine | Admitting: Internal Medicine

## 2018-01-08 ENCOUNTER — Encounter (HOSPITAL_COMMUNITY): Payer: Self-pay

## 2018-01-08 DIAGNOSIS — E119 Type 2 diabetes mellitus without complications: Secondary | ICD-10-CM | POA: Diagnosis present

## 2018-01-08 DIAGNOSIS — R0789 Other chest pain: Principal | ICD-10-CM | POA: Diagnosis present

## 2018-01-08 DIAGNOSIS — Z951 Presence of aortocoronary bypass graft: Secondary | ICD-10-CM

## 2018-01-08 DIAGNOSIS — D649 Anemia, unspecified: Secondary | ICD-10-CM | POA: Diagnosis present

## 2018-01-08 DIAGNOSIS — Z7951 Long term (current) use of inhaled steroids: Secondary | ICD-10-CM

## 2018-01-08 DIAGNOSIS — Z9861 Coronary angioplasty status: Secondary | ICD-10-CM

## 2018-01-08 DIAGNOSIS — F319 Bipolar disorder, unspecified: Secondary | ICD-10-CM | POA: Diagnosis present

## 2018-01-08 DIAGNOSIS — I2510 Atherosclerotic heart disease of native coronary artery without angina pectoris: Secondary | ICD-10-CM | POA: Diagnosis present

## 2018-01-08 DIAGNOSIS — E785 Hyperlipidemia, unspecified: Secondary | ICD-10-CM | POA: Diagnosis present

## 2018-01-08 DIAGNOSIS — Z9114 Patient's other noncompliance with medication regimen: Secondary | ICD-10-CM

## 2018-01-08 DIAGNOSIS — R079 Chest pain, unspecified: Secondary | ICD-10-CM

## 2018-01-08 DIAGNOSIS — Z91040 Latex allergy status: Secondary | ICD-10-CM

## 2018-01-08 DIAGNOSIS — I5032 Chronic diastolic (congestive) heart failure: Secondary | ICD-10-CM | POA: Diagnosis present

## 2018-01-08 DIAGNOSIS — M545 Low back pain: Secondary | ICD-10-CM | POA: Diagnosis present

## 2018-01-08 DIAGNOSIS — I10 Essential (primary) hypertension: Secondary | ICD-10-CM

## 2018-01-08 DIAGNOSIS — Z79899 Other long term (current) drug therapy: Secondary | ICD-10-CM

## 2018-01-08 DIAGNOSIS — I252 Old myocardial infarction: Secondary | ICD-10-CM

## 2018-01-08 DIAGNOSIS — Z87891 Personal history of nicotine dependence: Secondary | ICD-10-CM

## 2018-01-08 DIAGNOSIS — K210 Gastro-esophageal reflux disease with esophagitis: Secondary | ICD-10-CM | POA: Diagnosis present

## 2018-01-08 DIAGNOSIS — R110 Nausea: Secondary | ICD-10-CM

## 2018-01-08 DIAGNOSIS — J45909 Unspecified asthma, uncomplicated: Secondary | ICD-10-CM | POA: Diagnosis present

## 2018-01-08 DIAGNOSIS — G8929 Other chronic pain: Secondary | ICD-10-CM | POA: Diagnosis present

## 2018-01-08 DIAGNOSIS — Z880 Allergy status to penicillin: Secondary | ICD-10-CM

## 2018-01-08 DIAGNOSIS — Z7982 Long term (current) use of aspirin: Secondary | ICD-10-CM

## 2018-01-08 DIAGNOSIS — Z7902 Long term (current) use of antithrombotics/antiplatelets: Secondary | ICD-10-CM

## 2018-01-08 DIAGNOSIS — Z8673 Personal history of transient ischemic attack (TIA), and cerebral infarction without residual deficits: Secondary | ICD-10-CM

## 2018-01-08 DIAGNOSIS — I959 Hypotension, unspecified: Secondary | ICD-10-CM | POA: Diagnosis present

## 2018-01-08 DIAGNOSIS — E089 Diabetes mellitus due to underlying condition without complications: Secondary | ICD-10-CM | POA: Diagnosis present

## 2018-01-08 DIAGNOSIS — I25119 Atherosclerotic heart disease of native coronary artery with unspecified angina pectoris: Secondary | ICD-10-CM | POA: Diagnosis present

## 2018-01-08 DIAGNOSIS — F419 Anxiety disorder, unspecified: Secondary | ICD-10-CM | POA: Diagnosis present

## 2018-01-08 DIAGNOSIS — I110 Hypertensive heart disease with heart failure: Secondary | ICD-10-CM | POA: Diagnosis present

## 2018-01-08 DIAGNOSIS — E039 Hypothyroidism, unspecified: Secondary | ICD-10-CM | POA: Diagnosis present

## 2018-01-08 LAB — BASIC METABOLIC PANEL
Anion gap: 12 (ref 5–15)
BUN: 16 mg/dL (ref 6–20)
CO2: 24 mmol/L (ref 22–32)
Calcium: 9.3 mg/dL (ref 8.9–10.3)
Chloride: 102 mmol/L (ref 98–111)
Creatinine, Ser: 0.88 mg/dL (ref 0.44–1.00)
GFR calc Af Amer: >60 mL/min (ref >60)
GFR calc non Af Amer: >60 mL/min (ref >60)
Glucose, Bld: 103 mg/dL — ABNORMAL HIGH (ref 70–99)
POTASSIUM: 4.1 mmol/L (ref 3.5–5.1)
Sodium: 138 mmol/L (ref 135–145)

## 2018-01-08 LAB — CBC
HCT: 41.3 % (ref 36.0–46.0)
Hemoglobin: 12.9 g/dL (ref 12.0–15.0)
MCH: 29.3 pg (ref 26.0–34.0)
MCHC: 31.2 g/dL (ref 30.0–36.0)
MCV: 93.7 fL (ref 80.0–100.0)
Platelets: 352 10*3/uL (ref 150–400)
RBC: 4.41 MIL/uL (ref 3.87–5.11)
RDW: 16.1 % — ABNORMAL HIGH (ref 11.5–15.5)
WBC: 8.6 10*3/uL (ref 4.0–10.5)
nRBC: 0 % (ref 0.0–0.2)

## 2018-01-08 LAB — POCT I-STAT TROPONIN I: Troponin i, poc: 0 ng/mL (ref 0.00–0.08)

## 2018-01-08 MED ORDER — KETOROLAC TROMETHAMINE 30 MG/ML IJ SOLN
60.0000 mg | Freq: Once | INTRAMUSCULAR | Status: AC
  Administered 2018-01-08: 60 mg via INTRAMUSCULAR
  Filled 2018-01-08: qty 2

## 2018-01-08 MED ORDER — MORPHINE SULFATE (PF) 4 MG/ML IV SOLN
4.0000 mg | Freq: Once | INTRAVENOUS | Status: DC
  Filled 2018-01-08: qty 1
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
## 2018-01-08 NOTE — ED Triage Notes (Signed)
Patient c/o intermittent  left chest pain since yesterday. Patient last took NTG at 1545. Patient c/o SOB and chills.  Patient also c/o right lower back pain and states when she had heart surgery they stopped her pain management for her back pain.  Patient c/o right breast pain that was a sudden onset.

## 2018-01-09 ENCOUNTER — Other Ambulatory Visit: Payer: Self-pay

## 2018-01-09 ENCOUNTER — Encounter (HOSPITAL_COMMUNITY): Payer: Self-pay | Admitting: Internal Medicine

## 2018-01-09 DIAGNOSIS — R0789 Other chest pain: Secondary | ICD-10-CM | POA: Diagnosis present

## 2018-01-09 DIAGNOSIS — R079 Chest pain, unspecified: Secondary | ICD-10-CM

## 2018-01-09 DIAGNOSIS — I10 Essential (primary) hypertension: Secondary | ICD-10-CM

## 2018-01-09 LAB — CBC
HCT: 34.5 % — ABNORMAL LOW (ref 36.0–46.0)
Hemoglobin: 11.3 g/dL — ABNORMAL LOW (ref 12.0–15.0)
MCH: 30.3 pg (ref 26.0–34.0)
MCHC: 32.8 g/dL (ref 30.0–36.0)
MCV: 92.5 fL (ref 80.0–100.0)
Platelets: 325 10*3/uL (ref 150–400)
RBC: 3.73 MIL/uL — ABNORMAL LOW (ref 3.87–5.11)
RDW: 15.9 % — ABNORMAL HIGH (ref 11.5–15.5)
WBC: 8.9 10*3/uL (ref 4.0–10.5)
nRBC: 0 % (ref 0.0–0.2)

## 2018-01-09 LAB — TROPONIN I
Troponin I: <0.03 ng/mL (ref <0.03)
Troponin I: <0.03 ng/mL (ref <0.03)

## 2018-01-09 LAB — CREATININE, SERUM
Creatinine, Ser: 0.84 mg/dL (ref 0.44–1.00)
GFR calc Af Amer: >60 mL/min (ref >60)
GFR calc non Af Amer: >60 mL/min (ref >60)

## 2018-01-09 LAB — GLUCOSE, CAPILLARY
Glucose-Capillary: 115 mg/dL — ABNORMAL HIGH (ref 70–99)
Glucose-Capillary: 118 mg/dL — ABNORMAL HIGH (ref 70–99)
Glucose-Capillary: 114 mg/dL — ABNORMAL HIGH (ref 70–99)
Glucose-Capillary: 148 mg/dL — ABNORMAL HIGH (ref 70–99)

## 2018-01-09 MED ORDER — CALCIUM CARBONATE-VITAMIN D 600-400 MG-UNIT PO CHEW: 1.0000 | CHEWABLE_TABLET | Freq: Every day | ORAL | Status: DC

## 2018-01-09 MED ORDER — ALBUTEROL SULFATE (2.5 MG/3ML) 0.083% IN NEBU: 2.5000 mg | INHALATION_SOLUTION | RESPIRATORY_TRACT | Status: DC | PRN

## 2018-01-09 MED ORDER — CALCIUM CARBONATE-VITAMIN D 500-200 MG-UNIT PO TABS
1.0000 | ORAL_TABLET | Freq: Every day | ORAL | Status: DC
  Administered 2018-01-09 – 2018-01-14 (×5): 1 via ORAL
  Filled 2018-01-09 (×5): qty 1

## 2018-01-09 MED ORDER — CLOPIDOGREL BISULFATE 75 MG PO TABS
75.0000 mg | ORAL_TABLET | Freq: Every day | ORAL | Status: DC
  Administered 2018-01-09 – 2018-01-14 (×5): 75 mg via ORAL
  Filled 2018-01-09 (×5): qty 1

## 2018-01-09 MED ORDER — CARVEDILOL 3.125 MG PO TABS
3.1250 mg | ORAL_TABLET | Freq: Two times a day (BID) | ORAL | Status: DC
  Administered 2018-01-11 – 2018-01-14 (×7): 3.125 mg via ORAL
  Filled 2018-01-09 (×11): qty 1

## 2018-01-09 MED ORDER — ENOXAPARIN SODIUM 40 MG/0.4ML ~~LOC~~ SOLN
40.0000 mg | SUBCUTANEOUS | Status: DC
  Administered 2018-01-09 – 2018-01-14 (×5): 40 mg via SUBCUTANEOUS
  Filled 2018-01-09 (×5): qty 0.4

## 2018-01-09 MED ORDER — QUETIAPINE FUMARATE 50 MG PO TABS
300.0000 mg | ORAL_TABLET | Freq: Every day | ORAL | Status: DC
  Administered 2018-01-09 – 2018-01-13 (×6): 300 mg via ORAL
  Filled 2018-01-09 (×2): qty 6
  Filled 2018-01-09: qty 3
  Filled 2018-01-09: qty 1
  Filled 2018-01-09: qty 6
  Filled 2018-01-09: qty 3

## 2018-01-09 MED ORDER — PANTOPRAZOLE SODIUM 40 MG PO TBEC
40.0000 mg | DELAYED_RELEASE_TABLET | Freq: Every day | ORAL | Status: DC
  Administered 2018-01-09 – 2018-01-11 (×3): 40 mg via ORAL
  Filled 2018-01-09 (×3): qty 1

## 2018-01-09 MED ORDER — INSULIN ASPART 100 UNIT/ML ~~LOC~~ SOLN
0.0000 [IU] | Freq: Three times a day (TID) | SUBCUTANEOUS | Status: DC
  Administered 2018-01-10 (×2): 1 [IU] via SUBCUTANEOUS
  Administered 2018-01-10 – 2018-01-11 (×2): 2 [IU] via SUBCUTANEOUS
  Administered 2018-01-11 (×2): 1 [IU] via SUBCUTANEOUS
  Administered 2018-01-12: 2 [IU] via SUBCUTANEOUS
  Administered 2018-01-13: 1 [IU] via SUBCUTANEOUS
  Administered 2018-01-13 – 2018-01-14 (×2): 2 [IU] via SUBCUTANEOUS

## 2018-01-09 MED ORDER — OLANZAPINE 10 MG PO TABS
20.0000 mg | ORAL_TABLET | Freq: Every day | ORAL | Status: DC
  Administered 2018-01-09 – 2018-01-13 (×5): 20 mg via ORAL
  Filled 2018-01-09: qty 2
  Filled 2018-01-09 (×2): qty 4
  Filled 2018-01-09 (×2): qty 2

## 2018-01-09 MED ORDER — FLUTICASONE PROPIONATE 50 MCG/ACT NA SUSP
2.0000 | Freq: Every day | NASAL | Status: DC
  Administered 2018-01-10 – 2018-01-11 (×2): 2 via NASAL
  Filled 2018-01-09: qty 16

## 2018-01-09 MED ORDER — ONDANSETRON HCL 4 MG/2ML IJ SOLN
4.0000 mg | Freq: Four times a day (QID) | INTRAMUSCULAR | Status: DC | PRN
  Administered 2018-01-09: 4 mg via INTRAVENOUS
  Filled 2018-01-09: qty 2

## 2018-01-09 MED ORDER — NITROGLYCERIN 0.4 MG SL SUBL
0.4000 mg | SUBLINGUAL_TABLET | SUBLINGUAL | Status: DC | PRN
  Administered 2018-01-09 (×3): 0.4 mg via SUBLINGUAL
  Filled 2018-01-09: qty 1

## 2018-01-09 MED ORDER — FAMOTIDINE 20 MG PO TABS
20.0000 mg | ORAL_TABLET | Freq: Every day | ORAL | Status: DC
  Administered 2018-01-09 – 2018-01-11 (×3): 20 mg via ORAL
  Filled 2018-01-09 (×3): qty 1

## 2018-01-09 MED ORDER — KETOROLAC TROMETHAMINE 30 MG/ML IJ SOLN
30.0000 mg | Freq: Four times a day (QID) | INTRAMUSCULAR | Status: DC | PRN
  Administered 2018-01-09 – 2018-01-11 (×8): 30 mg via INTRAVENOUS
  Filled 2018-01-09 (×8): qty 1

## 2018-01-09 MED ORDER — ASPIRIN EC 81 MG PO TBEC
81.0000 mg | DELAYED_RELEASE_TABLET | Freq: Every day | ORAL | Status: DC
  Administered 2018-01-09 – 2018-01-14 (×5): 81 mg via ORAL
  Filled 2018-01-09 (×5): qty 1

## 2018-01-09 MED ORDER — AMLODIPINE BESYLATE 5 MG PO TABS
5.0000 mg | ORAL_TABLET | Freq: Every day | ORAL | Status: DC
  Filled 2018-01-09: qty 1

## 2018-01-09 MED ORDER — HYDROCHLOROTHIAZIDE 25 MG PO TABS
25.0000 mg | ORAL_TABLET | Freq: Every day | ORAL | Status: DC
  Filled 2018-01-09: qty 1

## 2018-01-09 MED ORDER — LEVOTHYROXINE SODIUM 25 MCG PO TABS
125.0000 ug | ORAL_TABLET | Freq: Every day | ORAL | Status: DC
  Administered 2018-01-09 – 2018-01-14 (×6): 125 ug via ORAL
  Filled 2018-01-09 (×6): qty 1

## 2018-01-09 MED ORDER — HYDROCODONE-ACETAMINOPHEN 5-325 MG PO TABS
2.0000 | ORAL_TABLET | Freq: Once | ORAL | Status: AC
  Administered 2018-01-09: 2 via ORAL
  Filled 2018-01-09: qty 2

## 2018-01-09 MED ORDER — SODIUM CHLORIDE 0.9 % IV BOLUS
500.0000 mL | Freq: Once | INTRAVENOUS | Status: AC
  Administered 2018-01-09: 500 mL via INTRAVENOUS

## 2018-01-09 MED ORDER — ACETAMINOPHEN 325 MG PO TABS
650.0000 mg | ORAL_TABLET | Freq: Once | ORAL | Status: AC
  Administered 2018-01-09: 650 mg via ORAL
  Filled 2018-01-09: qty 2

## 2018-01-09 MED ORDER — ROSUVASTATIN CALCIUM 20 MG PO TABS
20.0000 mg | ORAL_TABLET | Freq: Every day | ORAL | Status: DC
  Administered 2018-01-09 – 2018-01-14 (×5): 20 mg via ORAL
  Filled 2018-01-09 (×5): qty 1

## 2018-01-09 MED ORDER — BUPROPION HCL ER (XL) 150 MG PO TB24
150.0000 mg | ORAL_TABLET | Freq: Every evening | ORAL | Status: DC
  Administered 2018-01-09 – 2018-01-13 (×5): 150 mg via ORAL
  Filled 2018-01-09 (×6): qty 1

## 2018-01-09 MED ORDER — HYDROXYZINE HCL 25 MG PO TABS
25.0000 mg | ORAL_TABLET | Freq: Every day | ORAL | Status: DC
  Administered 2018-01-09 – 2018-01-13 (×5): 25 mg via ORAL
  Filled 2018-01-09 (×5): qty 1

## 2018-01-09 MED ORDER — CLONAZEPAM 0.125 MG PO TBDP
0.2500 mg | ORAL_TABLET | Freq: Two times a day (BID) | ORAL | Status: DC
  Administered 2018-01-09 – 2018-01-14 (×10): 0.25 mg via ORAL
  Filled 2018-01-09 (×10): qty 2

## 2018-01-09 MED ORDER — DARIFENACIN HYDROBROMIDE ER 7.5 MG PO TB24
7.5000 mg | ORAL_TABLET | Freq: Every day | ORAL | Status: DC
  Administered 2018-01-09 – 2018-01-14 (×5): 7.5 mg via ORAL
  Filled 2018-01-09 (×6): qty 1

## 2018-01-09 MED ORDER — ACETAMINOPHEN 325 MG PO TABS
650.0000 mg | ORAL_TABLET | ORAL | Status: DC | PRN
  Administered 2018-01-09 – 2018-01-14 (×2): 650 mg via ORAL
  Filled 2018-01-09 (×3): qty 2

## 2018-01-09 MED ORDER — POTASSIUM CHLORIDE CRYS ER 10 MEQ PO TBCR
20.0000 meq | EXTENDED_RELEASE_TABLET | Freq: Every day | ORAL | Status: DC
  Administered 2018-01-09 – 2018-01-14 (×5): 20 meq via ORAL
  Filled 2018-01-09 (×5): qty 2

## 2018-01-09 NOTE — Progress Notes (Signed)
Pt c/o "chronic" lower back pain, unrelieved by PO Tylenol and heat. Dr. Algis Liming paged and made aware.

## 2018-01-09 NOTE — ED Notes (Signed)
In to talk with patient-patient agitated and crying-states she has been here all day-allowed patient to vent and support given. Explained hospital bed will be provided and Dr. Hal Hope paged and patient's night time medication ordered.

## 2018-01-09 NOTE — ED Notes (Signed)
Pt given sandwich, crackers and coke, per request.

## 2018-01-09 NOTE — ED Notes (Signed)
Writer went to room to assist pt to [place] and pt started yelling that "no one was caring for her," and "I don't want that damn nurse back in here," referring to primary RN.  Writer attempted to explain to pt that Probation officer was there to assist pt to BR but pt began speaking on phone to wife stating, "nobody is caring for me and you better call someone before I snap on these fools. I'm laying here in the ED when I should be getting care. This is ridiculous and if you don't come up here I'm going to snap".  Writer asked pt if she needed BR assistance, and pt said, "don't take that tone with me".  Pt ripped off cardiac monitoring and bp cuff and asked where BR was. Pt now in [place] on phone with someone stating, "everyone is so rude I'm going to snap now. This ain't happening".  Charge RN aware and AC has been notified.

## 2018-01-09 NOTE — ED Notes (Signed)
This RN went into the pts room to answer a call light and the pt stated she needed to use the restroom. She then stated she wanted more pain medicine. This RN explained that the pt would be holding in the ED for the night and I would have to get in touch with the admitting doctor for her pain medicine. After this the pt stated, "I know that there is a bed up there for me." "Im uncomfortable." This RN offered a hospital bed for the pt and the pt demanded to speak to a supervisor. This RN was stating what the pt can expect down here and the pt was talking over this RN and would not let this RN explain the circumstances. Dentist notified.

## 2018-01-09 NOTE — ED Notes (Signed)
Pecolia Ades, PA at bedside evaluating patient.

## 2018-01-09 NOTE — ED Notes (Signed)
Manuel Bp  R:90/60 L:98/70
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
## 2018-01-09 NOTE — ED Notes (Addendum)
Pt stating she has been waiting to go to BR for over 45 mins. Writer notified of pt needing restroom assistance at [phone].

## 2018-01-09 NOTE — Plan of Care (Signed)

## 2018-01-09 NOTE — Progress Notes (Signed)
PROGRESS NOTE   Lindsay Rivera  WEX:[id number]    DOB: Nov 30, 1960    DOA: 01/08/2018  PCP: Antony Blackbird, MD   I have briefly reviewed patients previous medical records in [facility]  Brief Narrative:  57 year old female patient with PMH of CAD status post CABG and PCI, anxiety/depression/bipolar disorder, chronic diastolic CHF, chronic low back pain, CVA, GERD, HLD, HTN, hypothyroid, diet-controlled DM 2, recently hospitalized 3 weeks ago for chest pain when she underwent cardiac cath and assigned to medical management, presented again with precordial chest pain with left upper extremity radiation that started approximately 2 days PTA.  She ruled out for MI with negative EKGs and troponins x2.  Cardiology consulted.   Assessment & Plan:   Principal Problem:   Chest pain Active Problems:   CAD- CABG x 09 Feb 2013 (ND)   Hypothyroid   Bipolar disorder ([place])   Diabetes mellitus due to underlying condition without complications ([place])   CAD -S/P LM DES 04/17/15   Coronary artery disease involving native coronary artery of native heart with angina pectoris [facility])   Essential hypertension   Chest pain in patient with history of CAD, CABG, PCI, concerning for angina: EKG without acute ischemic changes.  Troponins x2 negative.  Reportedly chest pain responsive to NTG at home.  Cardiology consulted.  Cardiac cath 11/29/2017 revealed stable CAD.  Patient reports that she is supposed to have her sternal wires removed this Friday which apparently have been source of pain.  However currently states that current chest pain is different than that pain.  Continue aspirin, Plavix, statins and carvedilol.    Hypotension/essential hypertension: Became hypotensive in the ED with SBP of 80s after sublingual NTG x3.  Hypotension likely precipitated by NTG.  Blood pressures improved after normal saline bolus.  At home patient on amlodipine 5 mg daily, carvedilol 3.125 mg twice daily, HCTZ 25 mg daily.  Hold  antihypertensives until blood pressure stabilized.  Monitor closely.  Diet controlled DM2: Good inpatient control.  Continue SSI.  Hyperlipidemia: Continue statins.  Chronic diastolic CHF: Clinically euvolemic.  Hypothyroid: Clinically euthyroid.  Continue Synthroid.  Anxiety/depression/bipolar disorder: Stable without suicidal or homicidal ideations.  Continue bupropion, scheduled clonazepam, hydroxyzine, olanzapine and Seroquel.  Outpatient follow-up.  GERD: Continue famotidine.  Chronic low back pain: Tylenol, heating pad.  Anemia: Follow CBC in a.m.   DVT prophylaxis: Lovenox Code Status: Full Family Communication: None at bedside Disposition: DC home pending clinical improvement.   Consultants:  Cardiology  Procedures:  None  Antimicrobials:  None   Subjective: Patient seen this morning while she was still in the ED.  Reports chest pain 7/10, intermittent but without radiation to left upper extremity.  States that she has had chest pain since Sunday, radiating to left upper extremity, similar to MI that she had in 2015, relieved at home with NTG.  No dyspnea.  Denies any other complaints.  ROS: As above, otherwise negative.  Objective:  Vitals:   01/09/18 0945 01/09/18 1000 01/09/18 1015 01/09/18 1106  BP: (!) 77/60 (!) 77/56 (!) 81/56 118/90  Pulse: 75 75 75 71  Resp: 10 (!) 26 (!) 27 15  Temp:    97.7 F (36.5 C)  TempSrc:    Oral  SpO2: 91% 93% 93% 99%  Weight:    72.6 kg  Height:    5\' 3"  (1.6 m)    Examination:  General exam: Pleasant young female, moderately built and overweight lying comfortably propped up in bed. Respiratory system:  Clear to auscultation. Respiratory effort normal.  Midline sternotomy scar.  No reproducible chest wall tenderness. Cardiovascular system: S1 & S2 heard, RRR. No JVD, murmurs, rubs, gallops or clicks. No pedal edema.  Telemetry personally reviewed: Sinus rhythm. Gastrointestinal system: Abdomen is nondistended, soft  and nontender. No organomegaly or masses felt. Normal bowel sounds heard. Central nervous system: Alert and oriented. No focal neurological deficits. Extremities: Symmetric 5 x 5 power. Skin: No rashes, lesions or ulcers Psychiatry: Judgement and insight appear normal. Mood & affect appropriate.     Data Reviewed: I have personally reviewed following labs and imaging studies  CBC: Recent Labs  Lab 01/08/18 1731 01/09/18 0513  WBC 8.6 8.9  HGB 12.9 11.3*  HCT 41.3 34.5*  MCV 93.7 92.5  PLT 352 016   Basic Metabolic Panel: Recent Labs  Lab 01/08/18 1731 01/09/18 0513  NA 138  --   K 4.1  --   CL 102  --   CO2 24  --   GLUCOSE 103*  --   BUN 16  --   CREATININE 0.88 0.84  CALCIUM 9.3  --    Cardiac Enzymes: Recent Labs  Lab 01/09/18 0513 01/09/18 1108  TROPONINI <0.03 <0.03   CBG: Recent Labs  Lab 01/09/18 0822 01/09/18 1136  GLUCAP 115* 118*    No results found for this or any previous visit (from the past 240 hour(s)).       Radiology Studies: Dg Chest 2 View  Result Date: 01/08/2018 CLINICAL DATA:  Intermittent left chest pain since yesterday. EXAM: CHEST - 2 VIEW COMPARISON:  11/27/2017 FINDINGS: Previous median sternotomy with CABG procedure. The heart size and mediastinal contours are within normal limits. Both lungs are clear. The visualized skeletal structures are unremarkable. IMPRESSION: No active cardiopulmonary disease. Electronically Signed   By: Kerby Moors M.D.   On: 01/08/2018 17:12        Scheduled Meds: . amLODipine  5 mg Oral Daily  . aspirin EC  81 mg Oral Daily  . buPROPion  150 mg Oral QPM  . calcium-vitamin D  1 tablet Oral Q breakfast  . carvedilol  3.125 mg Oral BID  . clonazepam  0.25 mg Oral BID  . clopidogrel  75 mg Oral Daily  . darifenacin  7.5 mg Oral Daily  . enoxaparin (LOVENOX) injection  40 mg Subcutaneous Q24H  . famotidine  20 mg Oral Daily  . fluticasone  2 spray Each Nare Daily  . hydrochlorothiazide   25 mg Oral Daily  . hydrOXYzine  25 mg Oral QHS  . insulin aspart  0-9 Units Subcutaneous TID WC  . levothyroxine  125 mcg Oral Q0600  . OLANZapine  20 mg Oral QHS  . pantoprazole  40 mg Oral Daily  . potassium chloride  20 mEq Oral Daily  . QUEtiapine  300 mg Oral QHS  . rosuvastatin  20 mg Oral Daily   Continuous Infusions:   LOS: 0 days     Vernell Leep, MD, FACP, [facility]. [facility] Pager [phone]  If 7PM-7AM, please contact night-coverage [email or web address] Password [email or web address] 01/09/2018, 1:45 PM

## 2018-01-09 NOTE — ED Notes (Addendum)
Patient placed in hospital bed and night time medications given-patient very apologetic for earlier comments and behavior. Patient made comfortable in bed. Bedside table within reach. Patient given gingerale as requested with cup of ice. Call bell within reach on bedside table.

## 2018-01-09 NOTE — ED Notes (Signed)
Writer attempted to assist pt back to room with charge RN and pt yelled "I don't want her (referring to Probation officer) or that other nurse [employer], RN) in my room. They are so rude and I'm not dealing with them".  Writer left rm. Charge RN currently at bedside.
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
## 2018-01-10 ENCOUNTER — Observation Stay (HOSPITAL_COMMUNITY): Payer: Medicaid Other

## 2018-01-10 ENCOUNTER — Inpatient Hospital Stay (HOSPITAL_COMMUNITY): Payer: Medicaid Other

## 2018-01-10 ENCOUNTER — Encounter (HOSPITAL_COMMUNITY): Payer: Self-pay | Admitting: *Deleted

## 2018-01-10 DIAGNOSIS — G8929 Other chronic pain: Secondary | ICD-10-CM | POA: Diagnosis present

## 2018-01-10 DIAGNOSIS — Z79899 Other long term (current) drug therapy: Secondary | ICD-10-CM | POA: Diagnosis not present

## 2018-01-10 DIAGNOSIS — I25119 Atherosclerotic heart disease of native coronary artery with unspecified angina pectoris: Secondary | ICD-10-CM | POA: Diagnosis present

## 2018-01-10 DIAGNOSIS — I110 Hypertensive heart disease with heart failure: Secondary | ICD-10-CM | POA: Diagnosis present

## 2018-01-10 DIAGNOSIS — Z8673 Personal history of transient ischemic attack (TIA), and cerebral infarction without residual deficits: Secondary | ICD-10-CM | POA: Diagnosis not present

## 2018-01-10 DIAGNOSIS — F419 Anxiety disorder, unspecified: Secondary | ICD-10-CM | POA: Diagnosis present

## 2018-01-10 DIAGNOSIS — Z951 Presence of aortocoronary bypass graft: Secondary | ICD-10-CM | POA: Diagnosis not present

## 2018-01-10 DIAGNOSIS — R0789 Other chest pain: Secondary | ICD-10-CM | POA: Diagnosis present

## 2018-01-10 DIAGNOSIS — R071 Chest pain on breathing: Secondary | ICD-10-CM

## 2018-01-10 DIAGNOSIS — I252 Old myocardial infarction: Secondary | ICD-10-CM | POA: Diagnosis not present

## 2018-01-10 DIAGNOSIS — J45909 Unspecified asthma, uncomplicated: Secondary | ICD-10-CM | POA: Diagnosis present

## 2018-01-10 DIAGNOSIS — F319 Bipolar disorder, unspecified: Secondary | ICD-10-CM | POA: Diagnosis present

## 2018-01-10 DIAGNOSIS — Z7902 Long term (current) use of antithrombotics/antiplatelets: Secondary | ICD-10-CM | POA: Diagnosis not present

## 2018-01-10 DIAGNOSIS — Z880 Allergy status to penicillin: Secondary | ICD-10-CM | POA: Diagnosis not present

## 2018-01-10 DIAGNOSIS — M545 Low back pain: Secondary | ICD-10-CM | POA: Diagnosis present

## 2018-01-10 DIAGNOSIS — I959 Hypotension, unspecified: Secondary | ICD-10-CM | POA: Diagnosis present

## 2018-01-10 DIAGNOSIS — Z7982 Long term (current) use of aspirin: Secondary | ICD-10-CM | POA: Diagnosis not present

## 2018-01-10 DIAGNOSIS — E039 Hypothyroidism, unspecified: Secondary | ICD-10-CM | POA: Diagnosis present

## 2018-01-10 DIAGNOSIS — I5032 Chronic diastolic (congestive) heart failure: Secondary | ICD-10-CM | POA: Diagnosis present

## 2018-01-10 DIAGNOSIS — E119 Type 2 diabetes mellitus without complications: Secondary | ICD-10-CM | POA: Diagnosis present

## 2018-01-10 DIAGNOSIS — R079 Chest pain, unspecified: Secondary | ICD-10-CM | POA: Diagnosis present

## 2018-01-10 DIAGNOSIS — Z91040 Latex allergy status: Secondary | ICD-10-CM | POA: Diagnosis not present

## 2018-01-10 DIAGNOSIS — E785 Hyperlipidemia, unspecified: Secondary | ICD-10-CM | POA: Diagnosis present

## 2018-01-10 DIAGNOSIS — Z7951 Long term (current) use of inhaled steroids: Secondary | ICD-10-CM | POA: Diagnosis not present

## 2018-01-10 DIAGNOSIS — K210 Gastro-esophageal reflux disease with esophagitis: Secondary | ICD-10-CM | POA: Diagnosis present

## 2018-01-10 DIAGNOSIS — Z87891 Personal history of nicotine dependence: Secondary | ICD-10-CM | POA: Diagnosis not present

## 2018-01-10 LAB — CBC
HCT: 35.2 % — ABNORMAL LOW (ref 36.0–46.0)
Hemoglobin: 11.2 g/dL — ABNORMAL LOW (ref 12.0–15.0)
MCH: 29.9 pg (ref 26.0–34.0)
MCHC: 31.8 g/dL (ref 30.0–36.0)
MCV: 94.1 fL (ref 80.0–100.0)
Platelets: 343 10*3/uL (ref 150–400)
RBC: 3.74 MIL/uL — ABNORMAL LOW (ref 3.87–5.11)
RDW: 16 % — ABNORMAL HIGH (ref 11.5–15.5)
WBC: 6.7 10*3/uL (ref 4.0–10.5)
nRBC: 0 % (ref 0.0–0.2)

## 2018-01-10 LAB — GLUCOSE, CAPILLARY
Glucose-Capillary: 122 mg/dL — ABNORMAL HIGH (ref 70–99)
Glucose-Capillary: 125 mg/dL — ABNORMAL HIGH (ref 70–99)
Glucose-Capillary: 156 mg/dL — ABNORMAL HIGH (ref 70–99)
Glucose-Capillary: 143 mg/dL — ABNORMAL HIGH (ref 70–99)

## 2018-01-10 LAB — HEPATIC FUNCTION PANEL
ALK PHOS: 80 U/L (ref 38–126)
ALT: 17 U/L (ref 0–44)
AST: 19 U/L (ref 15–41)
Albumin: 3.2 g/dL — ABNORMAL LOW (ref 3.5–5.0)
BILIRUBIN TOTAL: 0.2 mg/dL — AB (ref 0.3–1.2)
Bilirubin, Direct: <0.1 mg/dL (ref 0.0–0.2)
Total Protein: 6.4 g/dL — ABNORMAL LOW (ref 6.5–8.1)

## 2018-01-10 LAB — LIPASE, BLOOD: Lipase: 25 U/L (ref 11–51)

## 2018-01-10 MED ORDER — IOHEXOL 300 MG/ML  SOLN
30.0000 mL | Freq: Once | INTRAMUSCULAR | Status: AC | PRN
  Administered 2018-01-10: 30 mL via ORAL

## 2018-01-10 MED ORDER — BISACODYL 10 MG RE SUPP
10.0000 mg | Freq: Once | RECTAL | Status: AC
  Administered 2018-01-10: 10 mg via RECTAL
  Filled 2018-01-10: qty 1

## 2018-01-10 MED ORDER — POLYETHYLENE GLYCOL 3350 17 G PO PACK
17.0000 g | PACK | Freq: Two times a day (BID) | ORAL | Status: DC
  Administered 2018-01-10 – 2018-01-14 (×6): 17 g via ORAL
  Filled 2018-01-10 (×7): qty 1

## 2018-01-10 MED ORDER — IOPAMIDOL (ISOVUE-300) INJECTION 61%
INTRAVENOUS | Status: AC
  Filled 2018-01-10: qty 100

## 2018-01-10 MED ORDER — IOPAMIDOL (ISOVUE-300) INJECTION 61%
100.0000 mL | Freq: Once | INTRAVENOUS | Status: AC | PRN
  Administered 2018-01-10: 100 mL via INTRAVENOUS

## 2018-01-10 MED ORDER — HYDROCODONE-ACETAMINOPHEN 5-325 MG PO TABS
2.0000 | ORAL_TABLET | Freq: Once | ORAL | Status: AC
  Administered 2018-01-10: 2 via ORAL
  Filled 2018-01-10: qty 2

## 2018-01-10 NOTE — Progress Notes (Addendum)
Progress Note  Patient Name: Lindsay Rivera Date of Encounter: 01/10/2018  Primary Cardiologist: Mertie Moores, MD   Subjective   Ms Lindsay Rivera continues to complain of significant chest pain, worse with deep breathing and certain positions.  She reports some improvement in her pain after receiving IV Toradol but complains that her pain soon returns.  She is lying almost flat on exam without any difficulty breathing.  Inpatient Medications    Scheduled Meds: . amLODipine  5 mg Oral Daily  . aspirin EC  81 mg Oral Daily  . buPROPion  150 mg Oral QPM  . calcium-vitamin D  1 tablet Oral Q breakfast  . carvedilol  3.125 mg Oral BID  . clonazepam  0.25 mg Oral BID  . clopidogrel  75 mg Oral Daily  . darifenacin  7.5 mg Oral Daily  . enoxaparin (LOVENOX) injection  40 mg Subcutaneous Q24H  . famotidine  20 mg Oral Daily  . fluticasone  2 spray Each Nare Daily  . hydrochlorothiazide  25 mg Oral Daily  . hydrOXYzine  25 mg Oral QHS  . insulin aspart  0-9 Units Subcutaneous TID WC  . levothyroxine  125 mcg Oral Q0600  . OLANZapine  20 mg Oral QHS  . pantoprazole  40 mg Oral Daily  . potassium chloride  20 mEq Oral Daily  . QUEtiapine  300 mg Oral QHS  . rosuvastatin  20 mg Oral Daily   Continuous Infusions:  PRN Meds: acetaminophen, albuterol, ketorolac, nitroGLYCERIN, ondansetron (ZOFRAN) IV   Vital Signs    Vitals:   01/09/18 1106 01/09/18 2132 01/10/18 0146 01/10/18 0454  BP: 118/90 96/66 94/75  97/76  Pulse: 71 78 67 82  Resp: 15 18 12 14   Temp: 97.7 F (36.5 C) 97.8 F (36.6 C) 97.7 F (36.5 C) 97.8 F (36.6 C)  TempSrc: Oral Oral Oral Oral  SpO2: 99% 98% 100% 93%  Weight: 72.6 kg     Height: 5\' 3"  (1.6 m)       Intake/Output Summary (Last 24 hours) at 01/10/2018 0749 Last data filed at 01/09/2018 2125 Gross per 24 hour  Intake 480 ml  Output -  Net 480 ml   Filed Weights   01/08/18 1621 01/09/18 1106  Weight: 72.6 kg 72.6 kg    Telemetry    Sinus  rhythm in the 80s- Personally Reviewed  ECG    No new tracings- Personally Reviewed  Physical Exam   GEN: No acute distress.   Neck: No JVD Cardiac: RRR, no murmurs, rubs, or gallops.  Respiratory: Clear to auscultation bilaterally. GI: Soft, nontender, non-distended  MS: No edema; No deformity. Neuro:  Nonfocal  Psych: Normal affect   Labs    Chemistry Recent Labs  Lab 01/08/18 1731 01/09/18 0513  NA 138  --   K 4.1  --   CL 102  --   CO2 24  --   GLUCOSE 103*  --   BUN 16  --   CREATININE 0.88 0.84  CALCIUM 9.3  --   GFRNONAA >60 >60  GFRAA >60 >60  ANIONGAP 12  --      Hematology Recent Labs  Lab 01/08/18 1731 01/09/18 0513 01/10/18 0525  WBC 8.6 8.9 6.7  RBC 4.41 3.73* 3.74*  HGB 12.9 11.3* 11.2*  HCT 41.3 34.5* 35.2*  MCV 93.7 92.5 94.1  MCH 29.3 30.3 29.9  MCHC 31.2 32.8 31.8  RDW 16.1* 15.9* 16.0*  PLT 352 325 343    Cardiac Enzymes  Recent Labs  Lab 01/09/18 0513 01/09/18 1108 01/09/18 1609  TROPONINI <0.03 <0.03 <0.03    Recent Labs  Lab 01/08/18 1737  TROPIPOC 0.00     BNPNo results for input(s): BNP, PROBNP in the last 168 hours.   DDimer No results for input(s): DDIMER in the last 168 hours.   Radiology    Dg Chest 2 View  Result Date: 01/08/2018 CLINICAL DATA:  Intermittent left chest pain since yesterday. EXAM: CHEST - 2 VIEW COMPARISON:  11/27/2017 FINDINGS: Previous median sternotomy with CABG procedure. The heart size and mediastinal contours are within normal limits. Both lungs are clear. The visualized skeletal structures are unremarkable. IMPRESSION: No active cardiopulmonary disease. Electronically Signed   By: Kerby Moors M.D.   On: 01/08/2018 17:12    Cardiac Studies    Cath: 11/29/17   Previously placed Ost LAD to Prox LAD stent (unknown type) is widely patent.  2nd Mrg lesion is 50% stenosed.  Mid Cx to Dist Cx lesion is 50% stenosed.  Mid LAD to Dist LAD lesion is 40% stenosed.  Mid RCA to Dist  RCA lesion is 100% stenosed.  SVG graft was visualized by angiography and is normal in caliber.  SVG graft was visualized by angiography and is normal in caliber.  LIMA graft was visualized by angiography and is small.  Mid Graft to Dist Graft lesion is 100% stenosed.  The left ventricular systolic function is normal.  LV end diastolic pressure is normal.  The left ventricular ejection fraction is 55-65% by visual estimate.  1. Single vessel occlusive CAD. 100% RCA after the RV marginal branch. The stent in the ostial LAD is patent. 2. Atretic LIMA to the LAD 3. Patent free RIMA to the diagonal 4. Patent SVG to the distal RCA 5. Normal LV function 6. Normal LVEDP  Plan: medical management.  Recommend Aspirin 81mg  daily for moderate CAD.  Echocardiogram 01/13/2016 Study Conclusions - Left ventricle: The cavity size was normal. Wall thickness was normal. Systolic function was normal. The estimated ejection fraction was in the range of 60% to 65%. Wall motion was normal; there were no regional wall motion abnormalities. Features are consistent with a pseudonormal left ventricular filling pattern, with concomitant abnormal relaxation and increased filling pressure (grade 2 diastolic dysfunction). - Aortic valve: There was no stenosis. - Mitral valve: There was no significant regurgitation. - Right ventricle: Poorly visualized. The cavity size was normal. Systolic function was normal. - Tricuspid valve: Peak RV-RA gradient (S): 20 mm Hg. - Pulmonary arteries: PA peak pressure: 23 mm Hg (S). - Inferior vena cava: The vessel was normal in size. The respirophasic diameter changes were in the normal range (>= 50%), consistent with normal central venous pressure.  Impressions: - Normal LV size with EF 60-65%. Moderate diastolic dysfunction. Normal RV size and systolic function. No significant valvular abnormalities.  Patient Profile     57 y.o.  female with a hx of CAD s/p CABG x3 and PCI afterwards to ostial LAD, DM, hypertension, CVA, hypothyroidism, bipolar disorder, hyperlipidemia, HSV on Valtrex, chronic diastolic CHF and noncompliance who is being seen for the evaluation of chest pain.  Recent cath showed patent grafts.  Ruled out for MI.  Plan for removal of sternal wires on Friday due to causing her pain.  Assessment & Plan    Chest pain -With known history of frequent complaints of chest pain.  Cath done on 11/29/2017 revealed stable CAD with patent grafts. -Chest pain is not felt to be  anginal, more pleuritic, worse with deep breathing and movement. -Started on Toradol yesterday for anti-inflammatory with some brief relief but her pain returns before next dose is due. -OK for discharge with pain meds from a cardiac standpoint. -I have made a follow-up appointment in our office for 2 weeks.  CAD s/p CABG 2015 -Cath on 11/29/2017 showed atretic LIMA-LAD, patent RIMA to the diagonal, patent SVG to dRCA, normal EF and LVEDP.  The plan was to continue medical management of CAD.   -She is continued on aspirin 81 mg, Plavix 75 mg, statin carvedilol.    Hypertension -The pressure was soft on presentation and decreased with sublingual nitroglycerin requiring IV bolus.  Amlodipine, carvedilol and hydrochlorothiazide were held yesterday. -BP still in the 90's. We will hold hydrochlorothiazide and put hold parameters on amlodipine   Chronic diastolic heart failure -Cardiac cath 11/29/2017 showed normal EF with normal LVEDP -Pt appears euvolemic on exam.   Hyperlipidemia -LDL 99.  Atorvastatin was switched to rosuvastatin, however the elevated LDL was likely related to medication noncompliance.    [facility] will sign off.   Medication Recommendations: Home with pain medication Other recommendations (labs, testing, etc): None Follow up as an outpatient: Follow-up appointment for 4-6 weeks has been made, on AVS. She will be  seen by Dr. Cyndia Bent for sternal wire removal.   For questions or updates, please contact [facility] Please consult [email or web address] for contact info under        Signed, Daune Perch, NP  01/10/2018, 7:49 AM    Pt seen and examined   I agree with findings as noted by J hammond above   Pt continues to complain of pleuritic CP    I do not think cardiac  Treat with pain meds , antiinflammatories  No evid for coronary ischemia On exam:   Lungs are CTA   Cardiac RRR  No Signif murmurs  Ext are without edema  IMpression OK to d/c from cardiac standpoint  No coronary ischemia   Will make sure has f/u    Dorris Carnes

## 2018-01-10 NOTE — Progress Notes (Signed)
PROGRESS NOTE    Lindsay Rivera  CHE:[id number] DOB: 1960/02/24 DOA: 01/08/2018 PCP: Antony Blackbird, MD   Brief Narrative: 57 year old with past medical history significant for coronary artery disease a status post CABG and PCI, anxiety, bipolar chronic diastolic heart failure, CVA, recently hospitalized 3 weeks ago for chest pain, underwent cardiac cath and recommendation was for medical management. Presented again with chest pain, troponin has been negative.  Cardiology has evaluated her.  Consideration is that the chest pain could be related to the wire that she has in her chest.  The schedule appointment for Friday for these.  He also has been having some epigastric right upper quadrant pain.  She vomited last night.  She has not had a bowel movement since more than 4 days prior to admission.   Assessment & Plan:   Principal Problem:   Chest pain Active Problems:   CAD- CABG x 09 Feb 2013 ([place])   Hypothyroid   Bipolar disorder ([place])   Diabetes mellitus due to underlying condition without complications ([place])   CAD -S/P LM DES 04/17/15   Coronary artery disease involving native coronary artery of native heart with angina pectoris ([place])   Essential hypertension   #1 chest pain; Underwent cardiac catheter last admission.  Etiology recommended medical management at that time. Cardiology evaluated patient on this admission, chest pain considered to be related to the sternal wire. She to follow-up on Friday with her surgeon.  #2 nausea, epigastric, abdominal pain She reports vomiting.  Bowel movement in the last several days. KUB consistent with constipation. Liver function test normal, Right upper quadrant ultrasound with pericholecystic fluid.  No cholecystitis.  They were recommending CT abdomen if further  Concern. Patient still complaining of abdominal pain, will get CT abdomen.  #3 hypotension Continue to hold blood pressure medication. Became hypotensive after  nitroglycerin.  Diabetes with sliding scale insulin.  Chronic diastolic heart failure.  Appears compensated.    Thyroidism continue with Synthroid    RN Pressure Injury Documentation:    Malnutrition Type:      Malnutrition Characteristics:      Nutrition Interventions:     Estimated body mass index is 28.35 kg/m as calculated from the following:   Height as of this encounter: 5\' 3"  (1.6 m).   Weight as of this encounter: 72.6 kg.   DVT prophylaxis: Lovenox Code Status: Full code Family Communication: Care discussed with patient Disposition Plan: We will need to get CT abdomen to further evaluate abdominal pain.  Will need to rule out cholecystitis. Consultants:   Cardiology   Procedures:   Right upper quadrant ultrasound; pericholecystic fluid   Antimicrobials:  None   Subjective: Complaining of chest pain. She is also complaining of epigastric right upper quadrant pain. Constipation.  Objective: Vitals:   01/10/18 0146 01/10/18 0454 01/10/18 0844 01/10/18 1251  BP: 94/75 97/76 101/72 108/70  Pulse: 67 82 80 79  Resp: 12 14    Temp: 97.7 F (36.5 C) 97.8 F (36.6 C) 98 F (36.7 C)   TempSrc: Oral Oral Oral   SpO2: 100% 93% 94% 100%  Weight:      Height:        Intake/Output Summary (Last 24 hours) at 01/10/2018 1600 Last data filed at 01/10/2018 0850 Gross per 24 hour  Intake 720 ml  Output -  Net 720 ml   Filed Weights   01/08/18 1621 01/09/18 1106  Weight: 72.6 kg 72.6 kg    Examination:  General exam: Appears  calm and comfortable  Respiratory system: Clear to auscultation. Respiratory effort normal. Cardiovascular system: S1 & S2 heard, RRR. No JVD, murmurs, rubs, gallops or clicks. No pedal edema. Gastrointestinal system: Abdomen is mildly distended, no rigidity  Central nervous system: Alert and oriented. No focal neurological deficits. Extremities: Symmetric 5 x 5 power. Skin: No rashes, lesions or ulcers Psychiatry:  Judgement and insight appear normal. Mood & affect appropriate.     Data Reviewed: I have personally reviewed following labs and imaging studies  CBC: Recent Labs  Lab 01/08/18 1731 01/09/18 0513 01/10/18 0525  WBC 8.6 8.9 6.7  HGB 12.9 11.3* 11.2*  HCT 41.3 34.5* 35.2*  MCV 93.7 92.5 94.1  PLT 352 325 595   Basic Metabolic Panel: Recent Labs  Lab 01/08/18 1731 01/09/18 0513  NA 138  --   K 4.1  --   CL 102  --   CO2 24  --   GLUCOSE 103*  --   BUN 16  --   CREATININE 0.88 0.84  CALCIUM 9.3  --    GFR: Estimated Creatinine Clearance: 70.6 mL/min (by C-G formula based on SCr of 0.84 mg/dL). Liver Function Tests: Recent Labs  Lab 01/10/18 0934  AST 19  ALT 17  ALKPHOS 80  BILITOT 0.2*  PROT 6.4*  ALBUMIN 3.2*   Recent Labs  Lab 01/10/18 0934  LIPASE 25   No results for input(s): AMMONIA in the last 168 hours. Coagulation Profile: No results for input(s): INR, PROTIME in the last 168 hours. Cardiac Enzymes: Recent Labs  Lab 01/09/18 0513 01/09/18 1108 01/09/18 1609  TROPONINI <0.03 <0.03 <0.03   BNP (last 3 results) No results for input(s): PROBNP in the last 8760 hours. HbA1C: No results for input(s): HGBA1C in the last 72 hours. CBG: Recent Labs  Lab 01/09/18 1136 01/09/18 1619 01/09/18 2134 01/10/18 0729 01/10/18 1134  GLUCAP 118* 114* 148* 122* 125*   Lipid Profile: No results for input(s): CHOL, HDL, LDLCALC, TRIG, CHOLHDL, LDLDIRECT in the last 72 hours. Thyroid Function Tests: No results for input(s): TSH, T4TOTAL, FREET4, T3FREE, THYROIDAB in the last 72 hours. Anemia Panel: No results for input(s): VITAMINB12, FOLATE, FERRITIN, TIBC, IRON, RETICCTPCT in the last 72 hours. Sepsis Labs: No results for input(s): PROCALCITON, LATICACIDVEN in the last 168 hours.  No results found for this or any previous visit (from the past 240 hour(s)).       Radiology Studies: Dg Chest 2 View  Result Date: 01/08/2018 CLINICAL DATA:   Intermittent left chest pain since yesterday. EXAM: CHEST - 2 VIEW COMPARISON:  11/27/2017 FINDINGS: Previous median sternotomy with CABG procedure. The heart size and mediastinal contours are within normal limits. Both lungs are clear. The visualized skeletal structures are unremarkable. IMPRESSION: No active cardiopulmonary disease. Electronically Signed   By: Kerby Moors M.D.   On: 01/08/2018 17:12   Dg Abd 1 View  Result Date: 01/10/2018 CLINICAL DATA:  Nausea chest pain EXAM: ABDOMEN - 1 VIEW COMPARISON:  02/26/2016 FINDINGS: Moderate stool in the right and transverse colon. Negative for bowel obstruction or ileus. Negative for urinary tract calculi. No acute skeletal abnormality. IMPRESSION: Normal bowel gas pattern. Moderate stool in the right and transverse colon. Electronically Signed   By: Franchot Gallo M.D.   On: 01/10/2018 11:14   US Abdomen Limited Ruq  Result Date: 01/10/2018 CLINICAL DATA:  57 year old female with a history of nausea EXAM: ULTRASOUND ABDOMEN LIMITED RIGHT UPPER QUADRANT COMPARISON:  CT 12/20/2017 FINDINGS: Gallbladder: Sonographic Murphy's sign  recorded negative. No evidence of hyperechoic material within the gallbladder. There is trace pericholecystic fluid. No gallbladder wall thickening or striations. Common bile duct: Diameter: 3 mm Liver: Heterogeneous appearance of liver parenchyma. Portal vein is patent on color Doppler imaging with normal direction of blood flow towards the liver. IMPRESSION: Sonographic survey negative for findings of acute cholecystitis. There is nonspecific trace pericholecystic fluid. If there is ongoing concern, abdominal CT may be useful. Heterogeneous appearance of the liver, suggesting steatosis or other medical liver disease. Electronically Signed   By: Corrie Mckusick D.O.   On: 01/10/2018 15:01        Scheduled Meds: . aspirin EC  81 mg Oral Daily  . buPROPion  150 mg Oral QPM  . calcium-vitamin D  1 tablet Oral Q breakfast  .  carvedilol  3.125 mg Oral BID  . clonazepam  0.25 mg Oral BID  . clopidogrel  75 mg Oral Daily  . darifenacin  7.5 mg Oral Daily  . enoxaparin (LOVENOX) injection  40 mg Subcutaneous Q24H  . famotidine  20 mg Oral Daily  . fluticasone  2 spray Each Nare Daily  . hydrOXYzine  25 mg Oral QHS  . insulin aspart  0-9 Units Subcutaneous TID WC  . levothyroxine  125 mcg Oral Q0600  . OLANZapine  20 mg Oral QHS  . pantoprazole  40 mg Oral Daily  . polyethylene glycol  17 g Oral BID  . potassium chloride  20 mEq Oral Daily  . QUEtiapine  300 mg Oral QHS  . rosuvastatin  20 mg Oral Daily   Continuous Infusions:   LOS: 0 days    Time spent: 35 minutes    Elmarie Shiley, MD [facility] Pager [phone]  If 7PM-7AM, please contact night-coverage [email or web address] Password [email or web address] 01/10/2018, 4:00 PM

## 2018-01-11 ENCOUNTER — Inpatient Hospital Stay (HOSPITAL_COMMUNITY): Admit: 2018-01-11 | Payer: No Typology Code available for payment source

## 2018-01-11 ENCOUNTER — Inpatient Hospital Stay (HOSPITAL_COMMUNITY): Payer: Medicaid Other

## 2018-01-11 DIAGNOSIS — K5900 Constipation, unspecified: Secondary | ICD-10-CM

## 2018-01-11 DIAGNOSIS — K219 Gastro-esophageal reflux disease without esophagitis: Secondary | ICD-10-CM

## 2018-01-11 LAB — CBC
HCT: 35.7 % — ABNORMAL LOW (ref 36.0–46.0)
HCT: 39 % (ref 36.0–46.0)
Hemoglobin: 11 g/dL — ABNORMAL LOW (ref 12.0–15.0)
Hemoglobin: 12.3 g/dL (ref 12.0–15.0)
MCH: 29.7 pg (ref 26.0–34.0)
MCH: 30.1 pg (ref 26.0–34.0)
MCHC: 30.8 g/dL (ref 30.0–36.0)
MCHC: 31.5 g/dL (ref 30.0–36.0)
MCV: 96.5 fL (ref 80.0–100.0)
MCV: 95.4 fL (ref 80.0–100.0)
NRBC: 0 % (ref 0.0–0.2)
Platelets: 327 10*3/uL (ref 150–400)
Platelets: 378 10*3/uL (ref 150–400)
RBC: 3.7 MIL/uL — ABNORMAL LOW (ref 3.87–5.11)
RBC: 4.09 MIL/uL (ref 3.87–5.11)
RDW: 16.2 % — ABNORMAL HIGH (ref 11.5–15.5)
RDW: 16.2 % — ABNORMAL HIGH (ref 11.5–15.5)
WBC: 6.5 10*3/uL (ref 4.0–10.5)
WBC: 8 10*3/uL (ref 4.0–10.5)
nRBC: 0 % (ref 0.0–0.2)

## 2018-01-11 LAB — GLUCOSE, CAPILLARY
GLUCOSE-CAPILLARY: 151 mg/dL — AB (ref 70–99)
Glucose-Capillary: 124 mg/dL — ABNORMAL HIGH (ref 70–99)
Glucose-Capillary: 142 mg/dL — ABNORMAL HIGH (ref 70–99)
Glucose-Capillary: 195 mg/dL — ABNORMAL HIGH (ref 70–99)

## 2018-01-11 LAB — PROTIME-INR
INR: 0.84
Prothrombin Time: 11.5 seconds (ref 11.4–15.2)

## 2018-01-11 LAB — COMPREHENSIVE METABOLIC PANEL
ALT: 45 U/L — ABNORMAL HIGH (ref 0–44)
AST: 50 U/L — ABNORMAL HIGH (ref 15–41)
Albumin: 3.6 g/dL (ref 3.5–5.0)
Alkaline Phosphatase: 103 U/L (ref 38–126)
Anion gap: 9 (ref 5–15)
BUN: 17 mg/dL (ref 6–20)
CHLORIDE: 108 mmol/L (ref 98–111)
CO2: 25 mmol/L (ref 22–32)
Calcium: 9.6 mg/dL (ref 8.9–10.3)
Creatinine, Ser: 0.79 mg/dL (ref 0.44–1.00)
GFR calc Af Amer: >60 mL/min (ref >60)
GFR calc non Af Amer: >60 mL/min (ref >60)
Glucose, Bld: 137 mg/dL — ABNORMAL HIGH (ref 70–99)
Potassium: 4.6 mmol/L (ref 3.5–5.1)
Sodium: 142 mmol/L (ref 135–145)
Total Bilirubin: 0.2 mg/dL — ABNORMAL LOW (ref 0.3–1.2)
Total Protein: 7.3 g/dL (ref 6.5–8.1)

## 2018-01-11 LAB — BASIC METABOLIC PANEL
Anion gap: 9 (ref 5–15)
BUN: 22 mg/dL — AB (ref 6–20)
CO2: 26 mmol/L (ref 22–32)
Calcium: 9 mg/dL (ref 8.9–10.3)
Chloride: 107 mmol/L (ref 98–111)
Creatinine, Ser: 0.87 mg/dL (ref 0.44–1.00)
GFR calc Af Amer: >60 mL/min (ref >60)
GFR calc non Af Amer: >60 mL/min (ref >60)
Glucose, Bld: 127 mg/dL — ABNORMAL HIGH (ref 70–99)
Potassium: 4.6 mmol/L (ref 3.5–5.1)
Sodium: 142 mmol/L (ref 135–145)

## 2018-01-11 LAB — APTT: aPTT: 33 seconds (ref 24–36)

## 2018-01-11 MED ORDER — SUCRALFATE 1 GM/10ML PO SUSP
1.0000 g | Freq: Three times a day (TID) | ORAL | Status: DC
  Administered 2018-01-11 – 2018-01-14 (×8): 1 g via ORAL
  Filled 2018-01-11 (×8): qty 10

## 2018-01-11 MED ORDER — FLUTICASONE PROPIONATE 50 MCG/ACT NA SUSP
2.0000 | Freq: Two times a day (BID) | NASAL | Status: DC | PRN
  Filled 2018-01-11 (×2): qty 16

## 2018-01-11 MED ORDER — MAGNESIUM CITRATE PO SOLN
1.0000 | Freq: Once | ORAL | Status: AC
  Administered 2018-01-11: 1 via ORAL
  Filled 2018-01-11: qty 296

## 2018-01-11 MED ORDER — FLEET ENEMA 7-19 GM/118ML RE ENEM
1.0000 | ENEMA | Freq: Once | RECTAL | Status: AC
  Administered 2018-01-11: 1 via RECTAL
  Filled 2018-01-11: qty 1

## 2018-01-11 MED ORDER — PANTOPRAZOLE SODIUM 40 MG PO TBEC
40.0000 mg | DELAYED_RELEASE_TABLET | Freq: Two times a day (BID) | ORAL | Status: DC
  Administered 2018-01-11 – 2018-01-14 (×5): 40 mg via ORAL
  Filled 2018-01-11 (×5): qty 1

## 2018-01-11 NOTE — Care Management Note (Signed)
Case Management Note  Patient Details  Name: Lindsay Rivera MRN: [id number] Date of Birth: 08-30-1960  Subjective/Objective: Acute to acute transfer to [facility] following on chest pain-for sternal wire removal.                   Action/Plan:dc home.   Expected Discharge Date:  (unknown)               Expected Discharge Plan:  Acute to Acute Transfer  In-House Referral:     Discharge planning Services  CM Consult  Post Acute Care Choice:    Choice offered to:     DME Arranged:    DME Agency:     HH Arranged:    HH Agency:     Status of Service:  In process, will continue to follow  If discussed at Long Length of Stay Meetings, dates discussed:    Additional Comments:  Dessa Phi, RN 01/11/2018, 11:44 AM

## 2018-01-11 NOTE — Progress Notes (Signed)
PROGRESS NOTE    Lindsay Rivera  QMG:[id number] DOB: 1960/03/13 DOA: 01/08/2018 PCP: Antony Blackbird, MD   Brief Narrative: 57 year old with past medical history significant for coronary artery disease a status post CABG and PCI, anxiety, bipolar chronic diastolic heart failure, CVA, recently hospitalized 3 weeks ago for chest pain, underwent cardiac cath and recommendation was for medical management. Presented again with chest pain, troponin has been negative.  Cardiology has evaluated her.  Consideration is that the chest pain could be related to the wire that she has in her chest.  The schedule appointment for Friday for these.  Patient also has been having some epigastric right upper quadrant pain.  She vomited the night of 12-04  She has not had a bowel movement since more than 5 days prior to admission. Patient today continues to have diffuse abdominal pain, no bowel movement.  Continues to complain of chest pain.  She has surgery scheduled for chest wire removed by Dr. Lawrence Marseilles tomorrow at [facility].  Discussed with Dr. Lawrence Marseilles nurse, will transfer patient to [facility]  They will see patient when she [facility] to [facility].   Assessment & Plan:   Principal Problem:   Chest pain Active Problems:   CAD- CABG x 09 Feb 2013 ([place])   Hypothyroid   Bipolar disorder ([place])   Diabetes mellitus due to underlying condition without complications ([place])   CAD -S/P LM DES 04/17/15   Coronary artery disease involving native coronary artery of native heart with angina pectoris ([place])   Essential hypertension   #1 chest pain; Underwent cardiac catheter last admission.  Etiology recommended medical management at that time. Cardiology evaluated patient on this admission, chest pain considered to be related to the sternal wire. Transferred to [facility] con for schedule surgery for sternal wire removal.   #2 nausea, epigastric, abdominal pain She reports vomiting.  Bowel movement in the last several days. KUB  consistent with constipation. Liver function test normal, Right upper quadrant ultrasound with pericholecystic fluid.  No cholecystitis.  They were recommending CT abdomen if further  Concern. CT abdomen unrevealing. GI consulted. Fleet enema ordered.  #3 hypotension Continue to hold blood pressure medication. Became hypotensive after nitroglycerin. Blood pressure has remained stable.  Diabetes with sliding scale insulin.  Chronic diastolic heart failure.  Appears compensated.   Thyroidism; continue with Synthroid    RN Pressure Injury Documentation:    Malnutrition Type:      Malnutrition Characteristics:      Nutrition Interventions:     Estimated body mass index is 28.35 kg/m as calculated from the following:   Height as of this encounter: 5\' 3"  (1.6 m).   Weight as of this encounter: 72.6 kg.   DVT prophylaxis: Lovenox Code Status: Full code Family Communication: Care discussed with patient, and family at bedside. Disposition Plan: She will be transferred to [facility] con for further care of chest pain. Consultants:   Cardiology   Procedures:   Right upper quadrant ultrasound; pericholecystic fluid   Antimicrobials:  None   Subjective: She is still complaining of chest pain, has not had a bowel movement yet. She still complaining of abdominal pain  Objective: Vitals:   01/10/18 0844 01/10/18 1251 01/10/18 2111 01/11/18 0524  BP: 101/72 108/70 (!) 96/53 121/83  Pulse: 80 79 75 72  Resp:   16 16  Temp: 98 F (36.7 C)  (!) 97.4 F (36.3 C) 97.6 F (36.4 C)  TempSrc: Oral  Oral Oral  SpO2: 94% 100% 97%  100%  Weight:      Height:        Intake/Output Summary (Last 24 hours) at 01/11/2018 0952 Last data filed at 01/11/2018 0600 Gross per 24 hour  Intake 960 ml  Output -  Net 960 ml   Filed Weights   01/08/18 1621 01/09/18 1106  Weight: 72.6 kg 72.6 kg    Examination:  General exam: Sick appearing, in pain. Respiratory system:  Clear to auscultation Cardiovascular system: S1, S2 regular rhythm and rate Gastrointestinal system: Sounds present, distended, mild tender, no rigidity. Central nervous system: Alert and oriented Extremities: Symmetric power Skin: No rashes   Data Reviewed: I have personally reviewed following labs and imaging studies  CBC: Recent Labs  Lab 01/08/18 1731 01/09/18 0513 01/10/18 0525  WBC 8.6 8.9 6.7  HGB 12.9 11.3* 11.2*  HCT 41.3 34.5* 35.2*  MCV 93.7 92.5 94.1  PLT 352 325 761   Basic Metabolic Panel: Recent Labs  Lab 01/08/18 1731 01/09/18 0513 01/11/18 0557  NA 138  --  142  K 4.1  --  4.6  CL 102  --  107  CO2 24  --  26  GLUCOSE 103*  --  127*  BUN 16  --  22*  CREATININE 0.88 0.84 0.87  CALCIUM 9.3  --  9.0   GFR: Estimated Creatinine Clearance: 68.1 mL/min (by C-G formula based on SCr of 0.87 mg/dL). Liver Function Tests: Recent Labs  Lab 01/10/18 0934  AST 19  ALT 17  ALKPHOS 80  BILITOT 0.2*  PROT 6.4*  ALBUMIN 3.2*   Recent Labs  Lab 01/10/18 0934  LIPASE 25   No results for input(s): AMMONIA in the last 168 hours. Coagulation Profile: No results for input(s): INR, PROTIME in the last 168 hours. Cardiac Enzymes: Recent Labs  Lab 01/09/18 0513 01/09/18 1108 01/09/18 1609  TROPONINI <0.03 <0.03 <0.03   BNP (last 3 results) No results for input(s): PROBNP in the last 8760 hours. HbA1C: No results for input(s): HGBA1C in the last 72 hours. CBG: Recent Labs  Lab 01/10/18 0729 01/10/18 1134 01/10/18 1650 01/10/18 2113 01/11/18 0748  GLUCAP 122* 125* 156* 143* 124*   Lipid Profile: No results for input(s): CHOL, HDL, LDLCALC, TRIG, CHOLHDL, LDLDIRECT in the last 72 hours. Thyroid Function Tests: No results for input(s): TSH, T4TOTAL, FREET4, T3FREE, THYROIDAB in the last 72 hours. Anemia Panel: No results for input(s): VITAMINB12, FOLATE, FERRITIN, TIBC, IRON, RETICCTPCT in the last 72 hours. Sepsis Labs: No results for  input(s): PROCALCITON, LATICACIDVEN in the last 168 hours.  No results found for this or any previous visit (from the past 240 hour(s)).       Radiology Studies: Dg Abd 1 View  Result Date: 01/10/2018 CLINICAL DATA:  Nausea chest pain EXAM: ABDOMEN - 1 VIEW COMPARISON:  02/26/2016 FINDINGS: Moderate stool in the right and transverse colon. Negative for bowel obstruction or ileus. Negative for urinary tract calculi. No acute skeletal abnormality. IMPRESSION: Normal bowel gas pattern. Moderate stool in the right and transverse colon. Electronically Signed   By: Franchot Gallo M.D.   On: 01/10/2018 11:14   Ct Abdomen Pelvis W Contrast  Result Date: 01/10/2018 CLINICAL DATA:  57 year old female with history of epigastric and right upper quadrant abdominal pain. EXAM: CT ABDOMEN AND PELVIS WITH CONTRAST TECHNIQUE: Multidetector CT imaging of the abdomen and pelvis was performed using the standard protocol following bolus administration of intravenous contrast. CONTRAST:  117mL ISOVUE-300 IOPAMIDOL (ISOVUE-300) INJECTION 61%, 37mL OMNIPAQUE IOHEXOL  300 MG/ML SOLN COMPARISON:  CT the abdomen and pelvis 12/20/2017. FINDINGS: Lower chest: Scarring in the inferior segment of the lingula and medial aspect of the left lower lobe. Atherosclerotic calcifications in the left anterior descending coronary artery. Abandoned epicardial pacing lead incidentally noted. Median sternotomy wires. Hepatobiliary: No suspicious cystic or solid hepatic lesions. No intra or extrahepatic biliary ductal dilatation. Gallbladder is normal in appearance. Pancreas: No pancreatic mass. No pancreatic ductal dilatation. No pancreatic or peripancreatic fluid or inflammatory changes. Spleen: Unremarkable. Adrenals/Urinary Tract: Subcentimeter low-attenuation lesion in the lower pole the left kidney, too small to characterize, but statistically likely to represent a tiny cyst. Right kidney and bilateral adrenal glands are normal in  appearance. No hydroureteronephrosis. Urinary bladder is normal in appearance. Stomach/Bowel: Normal appearance of the stomach. No pathologic dilatation of small bowel or colon. The appendix is not confidently identified and may be surgically absent. Regardless, there are no inflammatory changes noted adjacent to the cecum to suggest the presence of an acute appendicitis at this time. Vascular/Lymphatic: Aortic atherosclerosis, without evidence of aneurysm or dissection in the abdominal or pelvic vasculature. No lymphadenopathy noted in the abdomen or pelvis. Reproductive: Status post hysterectomy. Right ovary is unremarkable in appearance. Left ovary is not confidently identified may be surgically absent or atrophic. Other: No significant volume of ascites.  No pneumoperitoneum. Musculoskeletal: There are no aggressive appearing lytic or blastic lesions noted in the visualized portions of the skeleton. IMPRESSION: 1. No acute findings are noted in the abdomen or pelvis to account for the patient's symptoms. 2. Aortic atherosclerosis. 3. Additional incidental findings, as above. Electronically Signed   By: Vinnie Langton M.D.   On: 01/10/2018 20:58   US Abdomen Limited Ruq  Result Date: 01/10/2018 CLINICAL DATA:  57 year old female with a history of nausea EXAM: ULTRASOUND ABDOMEN LIMITED RIGHT UPPER QUADRANT COMPARISON:  CT 12/20/2017 FINDINGS: Gallbladder: Sonographic Murphy's sign recorded negative. No evidence of hyperechoic material within the gallbladder. There is trace pericholecystic fluid. No gallbladder wall thickening or striations. Common bile duct: Diameter: 3 mm Liver: Heterogeneous appearance of liver parenchyma. Portal vein is patent on color Doppler imaging with normal direction of blood flow towards the liver. IMPRESSION: Sonographic survey negative for findings of acute cholecystitis. There is nonspecific trace pericholecystic fluid. If there is ongoing concern, abdominal CT may be useful.  Heterogeneous appearance of the liver, suggesting steatosis or other medical liver disease. Electronically Signed   By: Corrie Mckusick D.O.   On: 01/10/2018 15:01        Scheduled Meds: . aspirin EC  81 mg Oral Daily  . buPROPion  150 mg Oral QPM  . calcium-vitamin D  1 tablet Oral Q breakfast  . carvedilol  3.125 mg Oral BID  . clonazepam  0.25 mg Oral BID  . clopidogrel  75 mg Oral Daily  . darifenacin  7.5 mg Oral Daily  . enoxaparin (LOVENOX) injection  40 mg Subcutaneous Q24H  . famotidine  20 mg Oral Daily  . fluticasone  2 spray Each Nare Daily  . hydrOXYzine  25 mg Oral QHS  . insulin aspart  0-9 Units Subcutaneous TID WC  . levothyroxine  125 mcg Oral Q0600  . OLANZapine  20 mg Oral QHS  . pantoprazole  40 mg Oral BID  . polyethylene glycol  17 g Oral BID  . potassium chloride  20 mEq Oral Daily  . QUEtiapine  300 mg Oral QHS  . rosuvastatin  20 mg Oral Daily  . sodium  phosphate  1 enema Rectal Once   Continuous Infusions:   LOS: 1 day    Time spent: 35 minutes    Elmarie Shiley, MD [facility] Pager [phone]  If 7PM-7AM, please contact night-coverage [email or web address] Password [email or web address] 01/11/2018, 9:52 AM
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
## 2018-01-12 ENCOUNTER — Inpatient Hospital Stay (HOSPITAL_COMMUNITY): Payer: Medicaid Other | Admitting: Certified Registered Nurse Anesthetist

## 2018-01-12 ENCOUNTER — Ambulatory Visit (HOSPITAL_COMMUNITY)
Admission: RE | Admit: 2018-01-12 | Payer: No Typology Code available for payment source | Source: Ambulatory Visit | Admitting: Surgery

## 2018-01-12 ENCOUNTER — Encounter (HOSPITAL_COMMUNITY)
Admission: EM | Disposition: A | Discharge status: Home Health | Payer: Self-pay | Source: Home / Self Care | Attending: Internal Medicine

## 2018-01-12 ENCOUNTER — Encounter (HOSPITAL_COMMUNITY): Payer: Self-pay | Admitting: *Deleted

## 2018-01-12 ENCOUNTER — Inpatient Hospital Stay (HOSPITAL_COMMUNITY): Payer: Medicaid Other

## 2018-01-12 DIAGNOSIS — Z9861 Coronary angioplasty status: Secondary | ICD-10-CM

## 2018-01-12 DIAGNOSIS — I2510 Atherosclerotic heart disease of native coronary artery without angina pectoris: Secondary | ICD-10-CM

## 2018-01-12 DIAGNOSIS — R110 Nausea: Secondary | ICD-10-CM

## 2018-01-12 DIAGNOSIS — T85898A Other specified complication of other internal prosthetic devices, implants and grafts, initial encounter: Secondary | ICD-10-CM

## 2018-01-12 DIAGNOSIS — E038 Other specified hypothyroidism: Secondary | ICD-10-CM

## 2018-01-12 HISTORY — PX: STERNAL WIRES REMOVAL: SHX2441

## 2018-01-12 LAB — SURGICAL PCR SCREEN
MRSA, PCR: NEGATIVE
Staphylococcus aureus: POSITIVE — AB

## 2018-01-12 LAB — GLUCOSE, CAPILLARY
GLUCOSE-CAPILLARY: 169 mg/dL — AB (ref 70–99)
Glucose-Capillary: 133 mg/dL — ABNORMAL HIGH (ref 70–99)
Glucose-Capillary: 107 mg/dL — ABNORMAL HIGH (ref 70–99)
Glucose-Capillary: 96 mg/dL (ref 70–99)
Glucose-Capillary: 192 mg/dL — ABNORMAL HIGH (ref 70–99)

## 2018-01-12 LAB — D-DIMER, QUANTITATIVE: D-Dimer, Quant: 0.63 ug/mL-FEU — ABNORMAL HIGH (ref 0.00–0.50)

## 2018-01-12 SURGERY — REMOVAL, STERNAL WIRE
Anesthesia: Monitor Anesthesia Care | Site: Chest

## 2018-01-12 MED ORDER — LACTATED RINGERS IV SOLN
INTRAVENOUS | Status: DC | PRN
  Administered 2018-01-12: 09:00:00 via INTRAVENOUS

## 2018-01-12 MED ORDER — ONDANSETRON HCL 4 MG/2ML IJ SOLN: 4.0000 mg | Freq: Once | INTRAMUSCULAR | Status: DC | PRN

## 2018-01-12 MED ORDER — LIDOCAINE-EPINEPHRINE (PF) 1 %-1:200000 IJ SOLN
INTRAMUSCULAR | Status: DC | PRN
  Administered 2018-01-12: 30 mL

## 2018-01-12 MED ORDER — OXYCODONE HCL 5 MG PO TABS
5.0000 mg | ORAL_TABLET | ORAL | Status: DC | PRN
  Administered 2018-01-12 – 2018-01-14 (×8): 5 mg via ORAL
  Filled 2018-01-12 (×8): qty 1

## 2018-01-12 MED ORDER — MIDAZOLAM HCL 5 MG/5ML IJ SOLN
INTRAMUSCULAR | Status: DC | PRN
  Administered 2018-01-12: 2 mg via INTRAVENOUS

## 2018-01-12 MED ORDER — IOPAMIDOL (ISOVUE-370) INJECTION 76%
100.0000 mL | Freq: Once | INTRAVENOUS | Status: AC | PRN
  Administered 2018-01-12: 100 mL via INTRAVENOUS

## 2018-01-12 MED ORDER — HEMOSTATIC AGENTS (NO CHARGE) OPTIME: TOPICAL | Status: DC | PRN

## 2018-01-12 MED ORDER — IOPAMIDOL (ISOVUE-370) INJECTION 76%
INTRAVENOUS | Status: AC
  Filled 2018-01-12: qty 100

## 2018-01-12 MED ORDER — THROMBIN (RECOMBINANT) 20000 UNITS EX SOLR
CUTANEOUS | Status: AC
  Filled 2018-01-12: qty 20000

## 2018-01-12 MED ORDER — FENTANYL CITRATE (PF) 100 MCG/2ML IJ SOLN
INTRAMUSCULAR | Status: AC
  Filled 2018-01-12: qty 2

## 2018-01-12 MED ORDER — ONDANSETRON HCL 4 MG/2ML IJ SOLN
INTRAMUSCULAR | Status: DC | PRN
  Administered 2018-01-12: 4 mg via INTRAVENOUS

## 2018-01-12 MED ORDER — FENTANYL CITRATE (PF) 100 MCG/2ML IJ SOLN
INTRAMUSCULAR | Status: DC | PRN
  Administered 2018-01-12 (×4): 50 ug via INTRAVENOUS

## 2018-01-12 MED ORDER — FENTANYL CITRATE (PF) 100 MCG/2ML IJ SOLN
25.0000 ug | INTRAMUSCULAR | Status: DC | PRN
  Administered 2018-01-12: 25 ug via INTRAVENOUS

## 2018-01-12 MED ORDER — KETOROLAC TROMETHAMINE 30 MG/ML IJ SOLN: 15.0000 mg | Freq: Four times a day (QID) | INTRAMUSCULAR | Status: DC | PRN

## 2018-01-12 MED ORDER — PROPOFOL 500 MG/50ML IV EMUL
INTRAVENOUS | Status: DC | PRN
  Administered 2018-01-12: 75 ug/kg/min via INTRAVENOUS

## 2018-01-12 MED ORDER — PHENYLEPHRINE HCL 10 MG/ML IJ SOLN
INTRAMUSCULAR | Status: DC | PRN
  Administered 2018-01-12: 80 ug via INTRAVENOUS
  Administered 2018-01-12: 120 ug via INTRAVENOUS
  Administered 2018-01-12: 160 ug via INTRAVENOUS
  Administered 2018-01-12: 120 ug via INTRAVENOUS
  Administered 2018-01-12: 80 ug via INTRAVENOUS

## 2018-01-12 MED ORDER — HYDROMORPHONE HCL 1 MG/ML IJ SOLN
1.0000 mg | INTRAMUSCULAR | Status: DC | PRN
  Administered 2018-01-12 – 2018-01-13 (×3): 1 mg via INTRAVENOUS
  Filled 2018-01-12 (×3): qty 1

## 2018-01-12 MED ORDER — LEVOFLOXACIN IN D5W 500 MG/100ML IV SOLN
500.0000 mg | Freq: Once | INTRAVENOUS | Status: AC
  Administered 2018-01-12: 500 mg via INTRAVENOUS
  Administered 2018-01-12: 80 mg via INTRAVENOUS
  Filled 2018-01-12: qty 100

## 2018-01-12 MED ORDER — THROMBIN 20000 UNITS EX SOLR: CUTANEOUS | Status: DC | PRN

## 2018-01-12 SURGICAL SUPPLY — 65 items
ATTRACTOMAT 16X20 MAGNETIC DRP (DRAPES) ×3 IMPLANT
BAG DECANTER FOR FLEXI CONT (MISCELLANEOUS) ×3 IMPLANT
BLADE SURG 10 STRL SS (BLADE) ×3 IMPLANT
BNDG GAUZE ELAST 4 BULKY (GAUZE/BANDAGES/DRESSINGS) IMPLANT
CANISTER SUCT 3000ML PPV (MISCELLANEOUS) ×3 IMPLANT
CATH THORACIC 28FR RT ANG (CATHETERS) IMPLANT
CATH THORACIC 36FR (CATHETERS) IMPLANT
CATH THORACIC 36FR RT ANG (CATHETERS) IMPLANT
CLIP VESOCCLUDE SM WIDE 24/CT (CLIP) ×3 IMPLANT
CONT SPEC 4OZ CLIKSEAL STRL BL (MISCELLANEOUS) IMPLANT
COVER SURGICAL LIGHT HANDLE (MISCELLANEOUS) ×6 IMPLANT
COVER WAND RF STERILE (DRAPES) IMPLANT
DERMABOND ADHESIVE PROPEN (GAUZE/BANDAGES/DRESSINGS) ×2
DERMABOND ADVANCED (GAUZE/BANDAGES/DRESSINGS) ×2
DERMABOND ADVANCED .7 DNX12 (GAUZE/BANDAGES/DRESSINGS) ×1 IMPLANT
DERMABOND ADVANCED .7 DNX6 (GAUZE/BANDAGES/DRESSINGS) ×1 IMPLANT
DRAPE INCISE IOBAN 66X45 STRL (DRAPES) ×3 IMPLANT
DRAPE LAPAROSCOPIC ABDOMINAL (DRAPES) ×3 IMPLANT
DRAPE SLUSH/WARMER DISC (DRAPES) IMPLANT
ELECT REM PT RETURN 9FT ADLT (ELECTROSURGICAL) ×3
ELECTRODE REM PT RTRN 9FT ADLT (ELECTROSURGICAL) ×1 IMPLANT
GAUZE SPONGE 4X4 12PLY STRL (GAUZE/BANDAGES/DRESSINGS) IMPLANT
GAUZE XEROFORM 5X9 LF (GAUZE/BANDAGES/DRESSINGS) IMPLANT
GLOVE BIOGEL PI IND STRL 7.0 (GLOVE) ×1 IMPLANT
GLOVE BIOGEL PI INDICATOR 7.0 (GLOVE) ×2
GLOVE EUDERMIC 7 POWDERFREE (GLOVE) ×3 IMPLANT
GOWN STRL REUS W/ TWL LRG LVL3 (GOWN DISPOSABLE) ×2 IMPLANT
GOWN STRL REUS W/ TWL XL LVL3 (GOWN DISPOSABLE) ×1 IMPLANT
GOWN STRL REUS W/TWL LRG LVL3 (GOWN DISPOSABLE) ×4
GOWN STRL REUS W/TWL XL LVL3 (GOWN DISPOSABLE) ×2
HANDPIECE INTERPULSE COAX TIP (DISPOSABLE)
HEMOSTAT POWDER SURGIFOAM 1G (HEMOSTASIS) ×6 IMPLANT
KIT BASIN OR (CUSTOM PROCEDURE TRAY) ×3 IMPLANT
KIT SUCTION CATH 14FR (SUCTIONS) IMPLANT
KIT TURNOVER KIT B (KITS) ×3 IMPLANT
MARKER SKIN DUAL TIP RULER LAB (MISCELLANEOUS) IMPLANT
NEEDLE 22X1 1/2 (OR ONLY) (NEEDLE) ×3 IMPLANT
NS IRRIG 1000ML POUR BTL (IV SOLUTION) ×3 IMPLANT
PACK CHEST (CUSTOM PROCEDURE TRAY) IMPLANT
PAD ARMBOARD 7.5X6 YLW CONV (MISCELLANEOUS) ×6 IMPLANT
PIN SAFETY STERILE (MISCELLANEOUS) IMPLANT
RUBBERBAND STERILE (MISCELLANEOUS) IMPLANT
SET HNDPC FAN SPRY TIP SCT (DISPOSABLE) IMPLANT
SOL PREP POV-IOD 4OZ 10% (MISCELLANEOUS) IMPLANT
SPONGE LAP 18X18 X RAY DECT (DISPOSABLE) ×3 IMPLANT
STRAP MONTGOMERY 1.25X11-1/8 (MISCELLANEOUS) IMPLANT
SUT STEEL 6MS V (SUTURE) IMPLANT
SUT STEEL STERNAL CCS#1 18IN (SUTURE) IMPLANT
SUT STEEL SZ 6 DBL 3X14 BALL (SUTURE) IMPLANT
SUT VIC AB 1 CTX 36 (SUTURE) ×2
SUT VIC AB 1 CTX36XBRD ANBCTR (SUTURE) ×1 IMPLANT
SUT VIC AB 2-0 CTX 36 (SUTURE) ×3 IMPLANT
SUT VIC AB 3-0 SH 27 (SUTURE) ×4
SUT VIC AB 3-0 SH 27X BRD (SUTURE) ×2 IMPLANT
SUT VIC AB 3-0 X1 27 (SUTURE) ×3 IMPLANT
SUT VIC AB 4-0 PS2 27 (SUTURE) ×9 IMPLANT
SWAB COLLECTION DEVICE MRSA (MISCELLANEOUS) IMPLANT
SWAB CULTURE ESWAB REG 1ML (MISCELLANEOUS) IMPLANT
SYR CONTROL 10ML LL (SYRINGE) ×3 IMPLANT
SYRINGE 20CC LL (MISCELLANEOUS) ×3 IMPLANT
SYSTEM SAHARA CHEST DRAIN ATS (WOUND CARE) IMPLANT
TOWEL GREEN STERILE (TOWEL DISPOSABLE) ×3 IMPLANT
TOWEL GREEN STERILE FF (TOWEL DISPOSABLE) ×3 IMPLANT
TRAY FOLEY MTR SLVR 14FR STAT (SET/KITS/TRAYS/PACK) IMPLANT
WATER STERILE IRR 1000ML POUR (IV SOLUTION) IMPLANT

## 2018-01-12 NOTE — OR Nursing (Signed)
Patient has eye brow jewelry and nose jewelry and a navel ring, all metal. Patient said the Leilani Able would not come out. tape was place over navel ring. Sindy Messing

## 2018-01-12 NOTE — Brief Op Note (Signed)
01/12/2018  10:37 AM  PATIENT:  Lindsay Rivera  57 y.o. female  PRE-OPERATIVE DIAGNOSIS:  STERNAL PAIN  POST-OPERATIVE DIAGNOSIS:  STERNAL PAIN  PROCEDURE:  Procedure(s): STERNAL WIRES REMOVAL (N/A)  SURGEON:  Surgeon(s) and Role:    * Bartle, Fernande Boyden, MD - Primary  PHYSICIAN ASSISTANT: none  ASSISTANTS: Delena Serve, RNFA  ANESTHESIA:   local and MAC  EBL: none  BLOOD ADMINISTERED:none  DRAINS: none   LOCAL MEDICATIONS USED:  LIDOCAINE   SPECIMEN:  No Specimen  DISPOSITION OF SPECIMEN:  N/A  COUNTS:  YES  TOURNIQUET:  * No tourniquets in log *  DICTATION: .Note written in EPIC  PLAN OF CARE: Admit to inpatient   PATIENT DISPOSITION:  PACU - hemodynamically stable.   Delay start of Pharmacological VTE agent (>24hrs) due to surgical blood loss or risk of bleeding: yes

## 2018-01-12 NOTE — Transfer of Care (Signed)
Immediate Anesthesia Transfer of Care Note  Patient: Lindsay Rivera  Procedure(s) Performed: STERNAL WIRES REMOVAL (N/A Chest)  Patient Location: PACU  Anesthesia Type:MAC  Level of Consciousness: drowsy  Airway & Oxygen Therapy: Patient Spontanous Breathing and Patient connected to nasal cannula oxygen  Post-op Assessment: Report given to RN and Post -op Vital signs reviewed and stable  Post vital signs: Reviewed and stable  Last Vitals:  Vitals Value Taken Time  BP 103/70 01/12/2018 10:42 AM  Temp    Pulse 94 01/12/2018 10:43 AM  Resp 11 01/12/2018 10:43 AM  SpO2 92 % 01/12/2018 10:43 AM  Vitals shown include unvalidated device data.  Last Pain:  Vitals:   01/12/18 0519  TempSrc: Oral  PainSc:       Patients Stated Pain Goal: 0 (41/66/06 3016)  Complications: No apparent anesthesia complications

## 2018-01-12 NOTE — Anesthesia Postprocedure Evaluation (Signed)
Anesthesia Post Note  Patient: Lindsay Rivera  Procedure(s) Performed: STERNAL WIRES REMOVAL (N/A Chest)     Patient location during evaluation: PACU Anesthesia Type: MAC Level of consciousness: awake and alert, awake and oriented Pain management: pain level controlled Vital Signs Assessment: post-procedure vital signs reviewed and stable Respiratory status: spontaneous breathing, nonlabored ventilation and respiratory function stable Cardiovascular status: stable and blood pressure returned to baseline Postop Assessment: no apparent nausea or vomiting Anesthetic complications: no    Last Vitals:  Vitals:   01/12/18 1153 01/12/18 1215  BP: (!) 101/58 109/62  Pulse: 83   Resp: 16 18  Temp: 36.6 C 36.6 C  SpO2: 94%     Last Pain:  Vitals:   01/12/18 1312  TempSrc:   PainSc: [place]

## 2018-01-12 NOTE — Anesthesia Preprocedure Evaluation (Signed)
Anesthesia Evaluation  Patient identified by MRN, date of birth, ID band Patient awake    Reviewed: Allergy & Precautions, NPO status , Patient's Chart, lab work & pertinent test results, reviewed documented beta blocker date and time   Airway Mallampati: II  TM Distance: >3 FB Neck ROM: Full    Dental  (+) Dental Advisory Given, Missing   Pulmonary asthma , former smoker,    Pulmonary exam normal breath sounds clear to auscultation       Cardiovascular hypertension, Pt. on home beta blockers and Pt. on medications + angina + CAD, + Past MI, + Cardiac Stents, + CABG and +CHF  Normal cardiovascular exam Rhythm:Regular Rate:Normal     Neuro/Psych  Headaches, PSYCHIATRIC DISORDERS Anxiety Depression Bipolar Disorder CVA, No Residual Symptoms    GI/Hepatic Neg liver ROS, GERD  Medicated,  Endo/Other  diabetes, Well Controlled, Type 2Hypothyroidism Obesity   Renal/GU negative Renal ROS     Musculoskeletal  (+) Arthritis ,   Abdominal   Peds  Hematology  (+) Blood dyscrasia, anemia ,   Anesthesia Other Findings Day of surgery medications reviewed with the patient.  Reproductive/Obstetrics                             Anesthesia Physical Anesthesia Plan  ASA: III  Anesthesia Plan: MAC   Post-op Pain Management:    Induction: Intravenous  PONV Risk Score and Plan: 2 and Propofol infusion and Treatment may vary due to age or medical condition  Airway Management Planned: Nasal Cannula and Natural Airway  Additional Equipment:   Intra-op Plan:   Post-operative Plan:   Informed Consent: I have reviewed the patients History and Physical, chart, labs and discussed the procedure including the risks, benefits and alternatives for the proposed anesthesia with the patient or authorized representative who has indicated his/her understanding and acceptance.   Dental advisory given  Plan  Discussed with: CRNA and Anesthesiologist  Anesthesia Plan Comments:         Anesthesia Quick Evaluation

## 2018-01-12 NOTE — Progress Notes (Signed)
Cardiothoracic Surgery Preop Note  The patient is a 57 year old woman with a history of hypertension, hyperlipidemia, hypothyroidism, prior stroke, type 2 diabetes, bipolar disorder, and coronary artery disease status post coronary bypass graft surgery in January 2015 in [place].  The patient subsequently moved here and had a cardiac catheterization in March 2017.  She was noted to have an atretic LIMA graft to the LAD with patent vein graft to the diagonal and right coronary artery.  There is an 85% proximal LAD stenosis which was treated with PCI with a DES.  She said that she has done well since then.  She was referred to my office by Dr. Acie Fredrickson on 01/03/2018 because she has had persistent chest wall discomfort since her bypass surgery.  She describes this as a sometimes aching, sometimes sharp pain around her sternum which is present most of the time but can be made worse with certain movements.  Her anterior chest wall remains very sensitive to touch.  She takes Tylenol as needed. Unfortunately she presented to [facility] on 01/08/2018 with chest pain and had a negative ECG. She was seen by Cardiology and troponins were negative x 2. Her pain was not felt to be cardiac in origin. I think the best option is to remove her sternal wires since that may be causing her pain which has been present since her surgery.  I discussed the operative procedure with her and the possibility that this may not resolve all of her chest discomfort.  I discussed the small risk of infection. She agrees to proceed.

## 2018-01-12 NOTE — Progress Notes (Signed)
[facility]                                                                              Patient Demographics  Lindsay Rivera, is a 57 y.o. female, DOB - [birth year], LAG:[id number]  Admit date - 01/08/2018   Admitting Physician Rise Patience, MD  Outpatient Primary MD for the patient is Antony Blackbird, MD  Outpatient specialists:   LOS - 2  days   Medical records reviewed and are as summarized below:    Chief Complaint  Patient presents with  . Chest Pain  . Back Pain       Brief summary   57 year old with past medical history significant for coronary artery disease a status post CABG and PCI, anxiety, bipolar chronic diastolic heart failure, CVA, recently hospitalized 3 weeks ago for chest pain, underwent cardiac cath and recommendation was for medical management. Presented again with chest pain, troponin has been negative seen by cardiology felt that her chest pain could be related to the sternal wire.  Patient transferred to [facility] for further work-up.  Patient also reported diffuse abdominal pain, epigastric, right upper quadrant region   Assessment & Plan    Principal Problem: Acute on chronic chest pain in the setting of CAD, CABG  x3v 1/15, -Underwent cardiac cath last admission, was recommended medical management. -Cardiology was consulted, given atypical symptoms with pleuritic component, recommended sternal wire removal, trop x3 negative -CT surgery consulted, patient underwent sternal wire removal today, -Seen after the surgery, still complaining of 10 out of 10 chest pain, placed on pain medication, pleuritic component and positional, no hypoxia - will rule out PE, check d-dimer, if elevated, obtain CT angiogram of the chest  Active Problems: Nausea vomiting abdominal pain -KUB consistent with constipation, LFTs normal, right upper quadrant ultrasound with pericholecystic fluid, no cholecystitis, lipase 25 -CT abdomen pelvis showed no  acute findings, GI following -Discontinued Toradol due to esophagitis, nausea and vomiting, likely exacerbating GERD    Hypothyroid -Continue Synthroid    Bipolar disorder (HCC) Currently stable, continue bupropion, clonazepam, Seroquel, olanzapine     Diabetes mellitus due to underlying condition without complications ([place]) Continue sliding scale insulin    Essential hypertension Continue Coreg  Code Status: Full CODE STATUS DVT Prophylaxis:  Lovenox  Family Communication: Discussed in detail with the patient, all imaging results, lab results explained to the patient    Disposition Plan:   Time Spent in minutes   35 minutes  Procedures:  Sternal wires removal on 12/6  Consultants:   Cardiology GI CT surgery  Antimicrobials:      Medications  Scheduled Meds: . aspirin EC  81 mg Oral Daily  . buPROPion  150 mg Oral QPM  . calcium-vitamin D  1 tablet Oral Q breakfast  . carvedilol  3.125 mg Oral BID  . clonazepam  0.25 mg Oral BID  . clopidogrel  75 mg Oral Daily  . darifenacin  7.5 mg Oral Daily  . enoxaparin (LOVENOX) injection  40 mg Subcutaneous Q24H  . hydrOXYzine  25 mg Oral QHS  . insulin aspart  0-9 Units Subcutaneous TID WC  .  levothyroxine  125 mcg Oral Q0600  . OLANZapine  20 mg Oral QHS  . pantoprazole  40 mg Oral BID  . polyethylene glycol  17 g Oral BID  . potassium chloride  20 mEq Oral Daily  . QUEtiapine  300 mg Oral QHS  . rosuvastatin  20 mg Oral Daily  . sucralfate  1 g Oral TID AC   Continuous Infusions: PRN Meds:.acetaminophen, albuterol, fluticasone, HYDROmorphone (DILAUDID) injection, nitroGLYCERIN, ondansetron (ZOFRAN) IV, oxyCODONE   Antibiotics   Anti-infectives (From admission, onward)   Start     Dose/Rate Route Frequency Ordered Stop   01/12/18 0930  levofloxacin (LEVAQUIN) IVPB 500 mg     500 mg 100 mL/hr over 60 Minutes Intravenous  Once 01/12/18 0927 01/12/18 1029        Subjective:   Magdelene Ruark was seen  and examined today.  Tearful, complaining of chest pain 10/10, worse with breathing and positional.  States no improvement after the sternal wire removal today.  States threw up last night and still having nausea.  No fevers  Objective:   Vitals:   01/12/18 1125 01/12/18 1140 01/12/18 1153 01/12/18 1215  BP: 105/80 105/63 (!) 101/58 109/62  Pulse: 85 84 83   Resp: 18 10 16 18   Temp:   97.8 F (36.6 C) 97.9 F (36.6 C)  TempSrc:    Oral  SpO2: 95% 93% 94%   Weight:      Height:        Intake/Output Summary (Last 24 hours) at 01/12/2018 1432 Last data filed at 01/12/2018 1324 Gross per 24 hour  Intake 1000 ml  Output -  Net 1000 ml     Wt Readings from Last 3 Encounters:  01/12/18 78 kg  01/03/18 72.6 kg  12/28/17 73.9 kg     Exam  General: Alert and oriented x 3, tearful, anxious and uncomfortable  Eyes:  HEENT:  Atraumatic, normocephalic,  Cardiovascular: S1 S2 auscultated, RRR.  Respiratory: Clear to auscultation bilaterally, no wheezing, rales or rhonchi  Gastrointestinal: Soft, nontender, nondistended, + bowel sounds  Ext: no pedal edema bilaterally  Neuro: No new deficits  Musculoskeletal: No digital cyanosis, clubbing  Skin: No rashes  Psych: Anxious and tearful   Data Reviewed:  I have personally reviewed following labs and imaging studies  Micro Results Recent Results (from the past 240 hour(s))  Surgical pcr screen     Status: Abnormal   Collection Time: 01/12/18  7:17 AM  Result Value Ref Range Status   MRSA, PCR NEGATIVE NEGATIVE Final   Staphylococcus aureus POSITIVE (A) NEGATIVE Final    Comment: (NOTE) The Xpert SA Assay (FDA approved for NASAL specimens in patients 58 years of age and older), is one component of a comprehensive surveillance program. It is not intended to diagnose infection nor to guide or monitor treatment. Performed at [facility], [place] [address], [place], [place] [address]     Radiology Reports Dg  Chest 2 View  Result Date: 01/11/2018 CLINICAL DATA:  Shortness of breath.  Sternal pain. EXAM: CHEST - 2 VIEW COMPARISON:  January 08, 2018 FINDINGS: Mild bibasilar atelectasis, new in the interval. The heart, hila, mediastinum, lungs, and pleura are otherwise unremarkable. IMPRESSION: Mild bibasilar atelectasis.  No other acute abnormalities. Electronically Signed   By: Dorise Bullion III M.D   On: 01/11/2018 15:44   Dg Chest 2 View  Result Date: 01/08/2018 CLINICAL DATA:  Intermittent left chest pain since yesterday. EXAM: CHEST - 2 VIEW  COMPARISON:  11/27/2017 FINDINGS: Previous median sternotomy with CABG procedure. The heart size and mediastinal contours are within normal limits. Both lungs are clear. The visualized skeletal structures are unremarkable. IMPRESSION: No active cardiopulmonary disease. Electronically Signed   By: Kerby Moors M.D.   On: 01/08/2018 17:12   Dg Abd 1 View  Result Date: 01/10/2018 CLINICAL DATA:  Nausea chest pain EXAM: ABDOMEN - 1 VIEW COMPARISON:  02/26/2016 FINDINGS: Moderate stool in the right and transverse colon. Negative for bowel obstruction or ileus. Negative for urinary tract calculi. No acute skeletal abnormality. IMPRESSION: Normal bowel gas pattern. Moderate stool in the right and transverse colon. Electronically Signed   By: Franchot Gallo M.D.   On: 01/10/2018 11:14   Ct Abdomen Pelvis W Contrast  Result Date: 01/10/2018 CLINICAL DATA:  57 year old female with history of epigastric and right upper quadrant abdominal pain. EXAM: CT ABDOMEN AND PELVIS WITH CONTRAST TECHNIQUE: Multidetector CT imaging of the abdomen and pelvis was performed using the standard protocol following bolus administration of intravenous contrast. CONTRAST:  138mL ISOVUE-300 IOPAMIDOL (ISOVUE-300) INJECTION 61%, 16mL OMNIPAQUE IOHEXOL 300 MG/ML SOLN COMPARISON:  CT the abdomen and pelvis 12/20/2017. FINDINGS: Lower chest: Scarring in the inferior segment of the lingula and medial  aspect of the left lower lobe. Atherosclerotic calcifications in the left anterior descending coronary artery. Abandoned epicardial pacing lead incidentally noted. Median sternotomy wires. Hepatobiliary: No suspicious cystic or solid hepatic lesions. No intra or extrahepatic biliary ductal dilatation. Gallbladder is normal in appearance. Pancreas: No pancreatic mass. No pancreatic ductal dilatation. No pancreatic or peripancreatic fluid or inflammatory changes. Spleen: Unremarkable. Adrenals/Urinary Tract: Subcentimeter low-attenuation lesion in the lower pole the left kidney, too small to characterize, but statistically likely to represent a tiny cyst. Right kidney and bilateral adrenal glands are normal in appearance. No hydroureteronephrosis. Urinary bladder is normal in appearance. Stomach/Bowel: Normal appearance of the stomach. No pathologic dilatation of small bowel or colon. The appendix is not confidently identified and may be surgically absent. Regardless, there are no inflammatory changes noted adjacent to the cecum to suggest the presence of an acute appendicitis at this time. Vascular/Lymphatic: Aortic atherosclerosis, without evidence of aneurysm or dissection in the abdominal or pelvic vasculature. No lymphadenopathy noted in the abdomen or pelvis. Reproductive: Status post hysterectomy. Right ovary is unremarkable in appearance. Left ovary is not confidently identified may be surgically absent or atrophic. Other: No significant volume of ascites.  No pneumoperitoneum. Musculoskeletal: There are no aggressive appearing lytic or blastic lesions noted in the visualized portions of the skeleton. IMPRESSION: 1. No acute findings are noted in the abdomen or pelvis to account for the patient's symptoms. 2. Aortic atherosclerosis. 3. Additional incidental findings, as above. Electronically Signed   By: Vinnie Langton M.D.   On: 01/10/2018 20:58   Ct Abdomen Pelvis W Contrast  Result Date:  12/21/2017 CLINICAL DATA:  Abdominal bloating for years. Hx of appy. Constipation. 100 ml Isovue 300 given. BUN 12 CRE 0.82 EXAM: CT ABDOMEN AND PELVIS WITH CONTRAST TECHNIQUE: Multidetector CT imaging of the abdomen and pelvis was performed using the standard protocol following bolus administration of intravenous contrast. CONTRAST:  169mL ISOVUE-300 IOPAMIDOL (ISOVUE-300) INJECTION 61% COMPARISON:  Abdominal ultrasound on 03/07/2014, CT of the abdomen and pelvis on 05/04 5 FINDINGS: Lower chest: Median sternotomy. Heart size is normal. No pericardial effusion. No pulmonary nodules, pleural effusions, or infiltrates. Hepatobiliary: No focal liver abnormality is seen. No radiopaque gallstones, biliary dilatation, or pericholecystic inflammatory changes. Pancreas: Unremarkable. No pancreatic  ductal dilatation or surrounding inflammatory changes. Spleen: Normal in size without focal abnormality. Adrenals/Urinary Tract: Small cysts are identified within the LEFT kidney. There is no hydronephrosis. Adrenal glands are normal in appearance. Ureters are unremarkable. The bladder and visualized portion of the urethra are normal. Stomach/Bowel: Stomach and small bowel loops are normal in appearance. Appendectomy. Colon is normal in appearance. Vascular/Lymphatic: There is minimal atherosclerotic calcification of the abdominal aorta not associated with aneurysm. There is normal vascular opacification of the celiac axis, superior mesenteric artery, and inferior mesenteric artery. Normal appearance of the portal venous system and inferior vena cava. Reproductive: Hysterectomy.  No adnexal mass. Other: No free pelvic fluid. Musculoskeletal: Mild lumbar spondylosis. No suspicious lytic or blastic lesions are identified. IMPRESSION: 1. No acute abnormality of the abdomen or pelvis. 2. Median sternotomy. 3.  Aortic atherosclerosis.  (ICD10-I70.0) 4. Small LEFT renal cyst. 5. Hysterectomy.  Appendectomy. 6. Mild lumbar spondylosis.  Electronically Signed   By: Nolon Nations M.D.   On: 12/21/2017 08:54   US Abdomen Limited Ruq  Result Date: 01/10/2018 CLINICAL DATA:  57 year old female with a history of nausea EXAM: ULTRASOUND ABDOMEN LIMITED RIGHT UPPER QUADRANT COMPARISON:  CT 12/20/2017 FINDINGS: Gallbladder: Sonographic Murphy's sign recorded negative. No evidence of hyperechoic material within the gallbladder. There is trace pericholecystic fluid. No gallbladder wall thickening or striations. Common bile duct: Diameter: 3 mm Liver: Heterogeneous appearance of liver parenchyma. Portal vein is patent on color Doppler imaging with normal direction of blood flow towards the liver. IMPRESSION: Sonographic survey negative for findings of acute cholecystitis. There is nonspecific trace pericholecystic fluid. If there is ongoing concern, abdominal CT may be useful. Heterogeneous appearance of the liver, suggesting steatosis or other medical liver disease. Electronically Signed   By: Corrie Mckusick D.O.   On: 01/10/2018 15:01    Lab Data:  CBC: Recent Labs  Lab 01/08/18 1731 01/09/18 0513 01/10/18 0525 01/11/18 0557 01/11/18 1712  WBC 8.6 8.9 6.7 6.5 8.0  HGB 12.9 11.3* 11.2* 11.0* 12.3  HCT 41.3 34.5* 35.2* 35.7* 39.0  MCV 93.7 92.5 94.1 96.5 95.4  PLT 352 325 343 327 637   Basic Metabolic Panel: Recent Labs  Lab 01/08/18 1731 01/09/18 0513 01/11/18 0557 01/11/18 1712  NA 138  --  142 142  K 4.1  --  4.6 4.6  CL 102  --  107 108  CO2 24  --  26 25  GLUCOSE 103*  --  127* 137*  BUN 16  --  22* 17  CREATININE 0.88 0.84 0.87 0.79  CALCIUM 9.3  --  9.0 9.6   GFR: Estimated Creatinine Clearance: 76.7 mL/min (by C-G formula based on SCr of 0.79 mg/dL). Liver Function Tests: Recent Labs  Lab 01/10/18 0934 01/11/18 1712  AST 19 50*  ALT 17 45*  ALKPHOS 80 103  BILITOT 0.2* 0.2*  PROT 6.4* 7.3  ALBUMIN 3.2* 3.6   Recent Labs  Lab 01/10/18 0934  LIPASE 25   No results for input(s): AMMONIA in the  last 168 hours. Coagulation Profile: Recent Labs  Lab 01/11/18 1712  INR 0.84   Cardiac Enzymes: Recent Labs  Lab 01/09/18 0513 01/09/18 1108 01/09/18 1609  TROPONINI <0.03 <0.03 <0.03   BNP (last 3 results) No results for input(s): PROBNP in the last 8760 hours. HbA1C: No results for input(s): HGBA1C in the last 72 hours. CBG: Recent Labs  Lab 01/11/18 1652 01/11/18 2123 01/12/18 0730 01/12/18 1048 01/12/18 1214  GLUCAP 142* 195* 133* 107* 96  Lipid Profile: No results for input(s): CHOL, HDL, LDLCALC, TRIG, CHOLHDL, LDLDIRECT in the last 72 hours. Thyroid Function Tests: No results for input(s): TSH, T4TOTAL, FREET4, T3FREE, THYROIDAB in the last 72 hours. Anemia Panel: No results for input(s): VITAMINB12, FOLATE, FERRITIN, TIBC, IRON, RETICCTPCT in the last 72 hours. Urine analysis:    Component Value Date/Time   COLORURINE YELLOW 09/20/2016 1709   APPEARANCEUR CLEAR 09/20/2016 1709   LABSPEC 1.012 09/20/2016 1709   PHURINE 6.0 09/20/2016 1709   GLUCOSEU NEGATIVE 09/20/2016 1709   HGBUR SMALL (A) 09/20/2016 1709   BILIRUBINUR NEGATIVE 09/20/2016 1709   BILIRUBINUR negative 02/22/2016 0929   KETONESUR NEGATIVE 09/20/2016 1709   PROTEINUR NEGATIVE 09/20/2016 1709   UROBILINOGEN 1.0 02/22/2016 0929   UROBILINOGEN 1.0 01/13/2009 1137   NITRITE NEGATIVE 09/20/2016 1709   LEUKOCYTESUR NEGATIVE 09/20/2016 1709     Shelden Raborn M.D. [facility] 01/12/2018, 2:32 PM  Pager: [phone] Between 7am to 7pm - call Pager - [phone]  After 7pm go to [email or web address] - password [email or web address]  Call night coverage person covering after 7pm

## 2018-01-13 ENCOUNTER — Encounter (HOSPITAL_COMMUNITY): Payer: Self-pay | Admitting: Surgery

## 2018-01-13 DIAGNOSIS — R0789 Other chest pain: Principal | ICD-10-CM

## 2018-01-13 LAB — BASIC METABOLIC PANEL
Anion gap: 11 (ref 5–15)
BUN: 12 mg/dL (ref 6–20)
CO2: 27 mmol/L (ref 22–32)
Calcium: 9.1 mg/dL (ref 8.9–10.3)
Chloride: 99 mmol/L (ref 98–111)
Creatinine, Ser: 1.07 mg/dL — ABNORMAL HIGH (ref 0.44–1.00)
GFR calc Af Amer: >60 mL/min (ref >60)
GFR calc non Af Amer: 58 mL/min — ABNORMAL LOW (ref >60)
Glucose, Bld: 158 mg/dL — ABNORMAL HIGH (ref 70–99)
Potassium: 4.2 mmol/L (ref 3.5–5.1)
Sodium: 137 mmol/L (ref 135–145)

## 2018-01-13 LAB — CBC
HCT: 34.1 % — ABNORMAL LOW (ref 36.0–46.0)
Hemoglobin: 10.9 g/dL — ABNORMAL LOW (ref 12.0–15.0)
MCH: 29 pg (ref 26.0–34.0)
MCHC: 32 g/dL (ref 30.0–36.0)
MCV: 90.7 fL (ref 80.0–100.0)
PLATELETS: 359 10*3/uL (ref 150–400)
RBC: 3.76 MIL/uL — ABNORMAL LOW (ref 3.87–5.11)
RDW: 15.9 % — ABNORMAL HIGH (ref 11.5–15.5)
WBC: 9.4 10*3/uL (ref 4.0–10.5)
nRBC: 0 % (ref 0.0–0.2)

## 2018-01-13 LAB — GLUCOSE, CAPILLARY
Glucose-Capillary: 130 mg/dL — ABNORMAL HIGH (ref 70–99)
Glucose-Capillary: 115 mg/dL — ABNORMAL HIGH (ref 70–99)
Glucose-Capillary: 194 mg/dL — ABNORMAL HIGH (ref 70–99)
Glucose-Capillary: 193 mg/dL — ABNORMAL HIGH (ref 70–99)

## 2018-01-13 MED ORDER — OXYCODONE HCL 5 MG PO TABS: 5.0000 mg | ORAL_TABLET | ORAL | 0 refills | Status: AC | PRN

## 2018-01-13 NOTE — Progress Notes (Signed)
[facility]                                                                              Patient Demographics  Lindsay Rivera, is a 57 y.o. female, DOB - [birth year], UUV:[id number]  Admit date - 01/08/2018   Admitting Physician Rise Patience, MD  Outpatient Primary MD for the patient is Antony Blackbird, MD  Outpatient specialists:   LOS - 3  days   Medical records reviewed and are as summarized below:    Chief Complaint  Patient presents with  . Chest Pain  . Back Pain       Brief summary   57 year old with past medical history significant for coronary artery disease a status post CABG and PCI, anxiety, bipolar chronic diastolic heart failure, CVA, recently hospitalized 3 weeks ago for chest pain, underwent cardiac cath and recommendation was for medical management. Presented again with chest pain, troponin has been negative seen by cardiology felt that her chest pain could be related to the sternal wire.  Patient transferred to [facility] for further work-up.  Patient also reported diffuse abdominal pain, epigastric, right upper quadrant region   Assessment & Plan    Principal Problem: Acute on chronic chest pain in the setting of CAD, CABG  x3v 1/15, -Underwent cardiac cath last admission, was recommended medical management. -Cardiology was consulted, given atypical symptoms with pleuritic component, recommended sternal wire removal, trop x3 negative -CT surgery consulted, patient underwent sternal wire removal on 01/12/2018.  Still continuing to complain of 8/10 chest pain.  CT angiogram chest on 12/6 neg for PE -Patient has been seen by GI currently on Protonix 40 twice daily, recommended outpatient follow-up - will stop IV pain medications, mobilize and assess for any pain medication seeking behavior  Active Problems: Nausea vomiting abdominal pain -KUB consistent with constipation, LFTs normal, right upper quadrant ultrasound with pericholecystic  fluid, no cholecystitis, lipase 25 -CT abdomen pelvis showed no acute findings, GI following -Discontinued Toradol due to esophagitis, nausea and vomiting, likely exacerbating GERD    Hypothyroid -Continue Synthroid    Bipolar disorder (HCC) Currently stable, continue bupropion, clonazepam, Seroquel, olanzapine     Diabetes mellitus due to underlying condition without complications ([place]) Continue sliding scale insulin    Essential hypertension Continue Coreg  Code Status: Full CODE STATUS DVT Prophylaxis:  Lovenox  Family Communication: Discussed in detail with the patient, all imaging results, lab results explained to the patient    Disposition Plan: DC IV pain medications today, evaluate on oral pain medications, mobilize, PT eval, Possible DC home in a.m. if stable  Time Spent in minutes   25 minutes Procedures:  Sternal wires removal on 12/6  Consultants:   Cardiology GI CT surgery  Antimicrobials:      Medications  Scheduled Meds: . aspirin EC  81 mg Oral Daily  . buPROPion  150 mg Oral QPM  . calcium-vitamin D  1 tablet Oral Q breakfast  . carvedilol  3.125 mg Oral BID  . clonazepam  0.25 mg Oral BID  . clopidogrel  75 mg Oral Daily  . darifenacin  7.5 mg Oral Daily  .  enoxaparin (LOVENOX) injection  40 mg Subcutaneous Q24H  . hydrOXYzine  25 mg Oral QHS  . insulin aspart  0-9 Units Subcutaneous TID WC  . levothyroxine  125 mcg Oral Q0600  . OLANZapine  20 mg Oral QHS  . pantoprazole  40 mg Oral BID  . polyethylene glycol  17 g Oral BID  . potassium chloride  20 mEq Oral Daily  . QUEtiapine  300 mg Oral QHS  . rosuvastatin  20 mg Oral Daily  . sucralfate  1 g Oral TID AC   Continuous Infusions: PRN Meds:.acetaminophen, albuterol, fluticasone, nitroGLYCERIN, ondansetron (ZOFRAN) IV, oxyCODONE   Antibiotics   Anti-infectives (From admission, onward)   Start     Dose/Rate Route Frequency Ordered Stop   01/12/18 0930  levofloxacin (LEVAQUIN) IVPB  500 mg     500 mg 100 mL/hr over 60 Minutes Intravenous  Once 01/12/18 0927 01/12/18 1029        Subjective:   Lindsay Rivera was seen and examined today.  States chest pain still 8/10 no shortness of breath, no palpitations, abdominal pain nausea vomiting.   Objective:   Vitals:   01/12/18 2151 01/13/18 0458 01/13/18 0749 01/13/18 0933  BP: 101/85 121/90 120/87 112/77  Pulse: (!) 101 97  90  Resp: 20     Temp: 98 F (36.7 C) 99.2 F (37.3 C)    TempSrc: Oral Oral    SpO2: 100% 96%    Weight:      Height:        Intake/Output Summary (Last 24 hours) at 01/13/2018 1339 Last data filed at 01/13/2018 1049 Gross per 24 hour  Intake 960 ml  Output -  Net 960 ml     Wt Readings from Last 3 Encounters:  01/12/18 78 kg  01/03/18 72.6 kg  12/28/17 73.9 kg      Physical Exam  General: Alert and oriented x 3, NAD  Eyes:   HEENT:   Cardiovascular: S1 S2 auscultated, RRR. No pedal edema b/l  Respiratory: CTA B, wounds clean and dry  Gastrointestinal: Soft, nontender, nondistended, + bowel sounds  Ext: no pedal edema bilaterally  Neuro: No new deficits t  Musculoskeletal: No digital cyanosis, clubbing  Skin: No rashes  Psych: Anxious    Data Reviewed:  I have personally reviewed following labs and imaging studies  Micro Results Recent Results (from the past 240 hour(s))  Surgical pcr screen     Status: Abnormal   Collection Time: 01/12/18  7:17 AM  Result Value Ref Range Status   MRSA, PCR NEGATIVE NEGATIVE Final   Staphylococcus aureus POSITIVE (A) NEGATIVE Final    Comment: (NOTE) The Xpert SA Assay (FDA approved for NASAL specimens in patients 55 years of age and older), is one component of a comprehensive surveillance program. It is not intended to diagnose infection nor to guide or monitor treatment. Performed at [facility], [place] [address], [place], [place] [address]     Radiology Reports Dg Chest 2 View  Result Date:  01/11/2018 CLINICAL DATA:  Shortness of breath.  Sternal pain. EXAM: CHEST - 2 VIEW COMPARISON:  January 08, 2018 FINDINGS: Mild bibasilar atelectasis, new in the interval. The heart, hila, mediastinum, lungs, and pleura are otherwise unremarkable. IMPRESSION: Mild bibasilar atelectasis.  No other acute abnormalities. Electronically Signed   By: Dorise Bullion III M.D   On: 01/11/2018 15:44   Dg Chest 2 View  Result Date: 01/08/2018 CLINICAL DATA:  Intermittent left chest pain since  yesterday. EXAM: CHEST - 2 VIEW COMPARISON:  11/27/2017 FINDINGS: Previous median sternotomy with CABG procedure. The heart size and mediastinal contours are within normal limits. Both lungs are clear. The visualized skeletal structures are unremarkable. IMPRESSION: No active cardiopulmonary disease. Electronically Signed   By: Kerby Moors M.D.   On: 01/08/2018 17:12   Dg Abd 1 View  Result Date: 01/10/2018 CLINICAL DATA:  Nausea chest pain EXAM: ABDOMEN - 1 VIEW COMPARISON:  02/26/2016 FINDINGS: Moderate stool in the right and transverse colon. Negative for bowel obstruction or ileus. Negative for urinary tract calculi. No acute skeletal abnormality. IMPRESSION: Normal bowel gas pattern. Moderate stool in the right and transverse colon. Electronically Signed   By: Franchot Gallo M.D.   On: 01/10/2018 11:14   Ct Angio Chest Pe W Or Wo Contrast  Result Date: 01/12/2018 CLINICAL DATA:  Chest pain, elevated D-dimer EXAM: CT ANGIOGRAPHY CHEST WITH CONTRAST TECHNIQUE: Multidetector CT imaging of the chest was performed using the standard protocol during bolus administration of intravenous contrast. Multiplanar CT image reconstructions and MIPs were obtained to evaluate the vascular anatomy. CONTRAST:  55 mL ISOVUE-370 IOPAMIDOL (ISOVUE-370) INJECTION 76% COMPARISON:  04/19/2015 FINDINGS: Cardiovascular: Satisfactory opacification of the pulmonary arteries to the segmental level. No evidence of pulmonary embolism. Normal heart  size. No pericardial effusion. Mediastinum/Nodes: No enlarged mediastinal or axillary lymph nodes. Stable 12 mm right hilar lymph node. Thyroid gland, trachea, and esophagus demonstrate no significant findings. Lungs/Pleura: Mild bibasilar atelectasis. No focal consolidation, pleural effusion or pneumothorax. 4 mm left upper lobe pulmonary nodule. Upper Abdomen: No acute abnormality. Musculoskeletal: No chest wall abnormality. No acute or significant osseous findings. Soft tissue emphysema and haziness in the subcutaneous fat anterior to the sternum consistent with recent sternal wire removal. Review of the MIP images confirms the above findings. IMPRESSION: 1. No evidence pulmonary embolus. 2. Postsurgical changes in the anterior presternal soft tissues from recent sternal wire removal. Electronically Signed   By: Kathreen Devoid   On: 01/12/2018 19:17   Ct Abdomen Pelvis W Contrast  Result Date: 01/10/2018 CLINICAL DATA:  57 year old female with history of epigastric and right upper quadrant abdominal pain. EXAM: CT ABDOMEN AND PELVIS WITH CONTRAST TECHNIQUE: Multidetector CT imaging of the abdomen and pelvis was performed using the standard protocol following bolus administration of intravenous contrast. CONTRAST:  114mL ISOVUE-300 IOPAMIDOL (ISOVUE-300) INJECTION 61%, 30mL OMNIPAQUE IOHEXOL 300 MG/ML SOLN COMPARISON:  CT the abdomen and pelvis 12/20/2017. FINDINGS: Lower chest: Scarring in the inferior segment of the lingula and medial aspect of the left lower lobe. Atherosclerotic calcifications in the left anterior descending coronary artery. Abandoned epicardial pacing lead incidentally noted. Median sternotomy wires. Hepatobiliary: No suspicious cystic or solid hepatic lesions. No intra or extrahepatic biliary ductal dilatation. Gallbladder is normal in appearance. Pancreas: No pancreatic mass. No pancreatic ductal dilatation. No pancreatic or peripancreatic fluid or inflammatory changes. Spleen:  Unremarkable. Adrenals/Urinary Tract: Subcentimeter low-attenuation lesion in the lower pole the left kidney, too small to characterize, but statistically likely to represent a tiny cyst. Right kidney and bilateral adrenal glands are normal in appearance. No hydroureteronephrosis. Urinary bladder is normal in appearance. Stomach/Bowel: Normal appearance of the stomach. No pathologic dilatation of small bowel or colon. The appendix is not confidently identified and may be surgically absent. Regardless, there are no inflammatory changes noted adjacent to the cecum to suggest the presence of an acute appendicitis at this time. Vascular/Lymphatic: Aortic atherosclerosis, without evidence of aneurysm or dissection in the abdominal or pelvic vasculature.  No lymphadenopathy noted in the abdomen or pelvis. Reproductive: Status post hysterectomy. Right ovary is unremarkable in appearance. Left ovary is not confidently identified may be surgically absent or atrophic. Other: No significant volume of ascites.  No pneumoperitoneum. Musculoskeletal: There are no aggressive appearing lytic or blastic lesions noted in the visualized portions of the skeleton. IMPRESSION: 1. No acute findings are noted in the abdomen or pelvis to account for the patient's symptoms. 2. Aortic atherosclerosis. 3. Additional incidental findings, as above. Electronically Signed   By: Vinnie Langton M.D.   On: 01/10/2018 20:58   Ct Abdomen Pelvis W Contrast  Result Date: 12/21/2017 CLINICAL DATA:  Abdominal bloating for years. Hx of appy. Constipation. 100 ml Isovue 300 given. BUN 12 CRE 0.82 EXAM: CT ABDOMEN AND PELVIS WITH CONTRAST TECHNIQUE: Multidetector CT imaging of the abdomen and pelvis was performed using the standard protocol following bolus administration of intravenous contrast. CONTRAST:  148mL ISOVUE-300 IOPAMIDOL (ISOVUE-300) INJECTION 61% COMPARISON:  Abdominal ultrasound on 03/07/2014, CT of the abdomen and pelvis on 05/04 5  FINDINGS: Lower chest: Median sternotomy. Heart size is normal. No pericardial effusion. No pulmonary nodules, pleural effusions, or infiltrates. Hepatobiliary: No focal liver abnormality is seen. No radiopaque gallstones, biliary dilatation, or pericholecystic inflammatory changes. Pancreas: Unremarkable. No pancreatic ductal dilatation or surrounding inflammatory changes. Spleen: Normal in size without focal abnormality. Adrenals/Urinary Tract: Small cysts are identified within the LEFT kidney. There is no hydronephrosis. Adrenal glands are normal in appearance. Ureters are unremarkable. The bladder and visualized portion of the urethra are normal. Stomach/Bowel: Stomach and small bowel loops are normal in appearance. Appendectomy. Colon is normal in appearance. Vascular/Lymphatic: There is minimal atherosclerotic calcification of the abdominal aorta not associated with aneurysm. There is normal vascular opacification of the celiac axis, superior mesenteric artery, and inferior mesenteric artery. Normal appearance of the portal venous system and inferior vena cava. Reproductive: Hysterectomy.  No adnexal mass. Other: No free pelvic fluid. Musculoskeletal: Mild lumbar spondylosis. No suspicious lytic or blastic lesions are identified. IMPRESSION: 1. No acute abnormality of the abdomen or pelvis. 2. Median sternotomy. 3.  Aortic atherosclerosis.  (ICD10-I70.0) 4. Small LEFT renal cyst. 5. Hysterectomy.  Appendectomy. 6. Mild lumbar spondylosis. Electronically Signed   By: Nolon Nations M.D.   On: 12/21/2017 08:54   US Abdomen Limited Ruq  Result Date: 01/10/2018 CLINICAL DATA:  57 year old female with a history of nausea EXAM: ULTRASOUND ABDOMEN LIMITED RIGHT UPPER QUADRANT COMPARISON:  CT 12/20/2017 FINDINGS: Gallbladder: Sonographic Murphy's sign recorded negative. No evidence of hyperechoic material within the gallbladder. There is trace pericholecystic fluid. No gallbladder wall thickening or striations.  Common bile duct: Diameter: 3 mm Liver: Heterogeneous appearance of liver parenchyma. Portal vein is patent on color Doppler imaging with normal direction of blood flow towards the liver. IMPRESSION: Sonographic survey negative for findings of acute cholecystitis. There is nonspecific trace pericholecystic fluid. If there is ongoing concern, abdominal CT may be useful. Heterogeneous appearance of the liver, suggesting steatosis or other medical liver disease. Electronically Signed   By: Corrie Mckusick D.O.   On: 01/10/2018 15:01    Lab Data:  CBC: Recent Labs  Lab 01/09/18 0513 01/10/18 0525 01/11/18 0557 01/11/18 1712 01/13/18 0351  WBC 8.9 6.7 6.5 8.0 9.4  HGB 11.3* 11.2* 11.0* 12.3 10.9*  HCT 34.5* 35.2* 35.7* 39.0 34.1*  MCV 92.5 94.1 96.5 95.4 90.7  PLT 325 343 327 378 300   Basic Metabolic Panel: Recent Labs  Lab 01/08/18 1731 01/09/18 0513 01/11/18  8309 01/11/18 1712 01/13/18 0351  NA 138  --  142 142 137  K 4.1  --  4.6 4.6 4.2  CL 102  --  107 108 99  CO2 24  --  26 25 27   GLUCOSE 103*  --  127* 137* 158*  BUN 16  --  22* 17 12  CREATININE 0.88 0.84 0.87 0.79 1.07*  CALCIUM 9.3  --  9.0 9.6 9.1   GFR: Estimated Creatinine Clearance: 57.3 mL/min (A) (by C-G formula based on SCr of 1.07 mg/dL (H)). Liver Function Tests: Recent Labs  Lab 01/10/18 0934 01/11/18 1712  AST 19 50*  ALT 17 45*  ALKPHOS 80 103  BILITOT 0.2* 0.2*  PROT 6.4* 7.3  ALBUMIN 3.2* 3.6   Recent Labs  Lab 01/10/18 0934  LIPASE 25   No results for input(s): AMMONIA in the last 168 hours. Coagulation Profile: Recent Labs  Lab 01/11/18 1712  INR 0.84   Cardiac Enzymes: Recent Labs  Lab 01/09/18 0513 01/09/18 1108 01/09/18 1609  TROPONINI <0.03 <0.03 <0.03   BNP (last 3 results) No results for input(s): PROBNP in the last 8760 hours. HbA1C: No results for input(s): HGBA1C in the last 72 hours. CBG: Recent Labs  Lab 01/12/18 1214 01/12/18 1739 01/12/18 2135  01/13/18 0735 01/13/18 1150  GLUCAP 96 169* 192* 130* 115*   Lipid Profile: No results for input(s): CHOL, HDL, LDLCALC, TRIG, CHOLHDL, LDLDIRECT in the last 72 hours. Thyroid Function Tests: No results for input(s): TSH, T4TOTAL, FREET4, T3FREE, THYROIDAB in the last 72 hours. Anemia Panel: No results for input(s): VITAMINB12, FOLATE, FERRITIN, TIBC, IRON, RETICCTPCT in the last 72 hours. Urine analysis:    Component Value Date/Time   COLORURINE YELLOW 09/20/2016 1709   APPEARANCEUR CLEAR 09/20/2016 1709   LABSPEC 1.012 09/20/2016 1709   PHURINE 6.0 09/20/2016 1709   GLUCOSEU NEGATIVE 09/20/2016 1709   HGBUR SMALL (A) 09/20/2016 1709   BILIRUBINUR NEGATIVE 09/20/2016 1709   BILIRUBINUR negative 02/22/2016 0929   KETONESUR NEGATIVE 09/20/2016 1709   PROTEINUR NEGATIVE 09/20/2016 1709   UROBILINOGEN 1.0 02/22/2016 0929   UROBILINOGEN 1.0 01/13/2009 1137   NITRITE NEGATIVE 09/20/2016 1709   LEUKOCYTESUR NEGATIVE 09/20/2016 1709     Jacai Kipp M.D. [facility] 01/13/2018, 1:39 PM  Pager: [phone] Between 7am to 7pm - call Pager - [phone]  After 7pm go to [email or web address] - password [email or web address]  Call night coverage person covering after 7pm

## 2018-01-13 NOTE — Progress Notes (Signed)
      [address]       [place] [address]             [phone]        1 Day Post-Op Procedure(s) (LRB): STERNAL WIRES REMOVAL (N/A)  Subjective: Patient with sternal incisional pain this am.  Objective: Vital signs in last 24 hours: Temp:  [97.8 F (36.6 C)-99.2 F (37.3 C)] 99.2 F (37.3 C) (12/07 0458) Pulse Rate:  [83-101] 90 (12/07 0933) Cardiac Rhythm: Normal sinus rhythm (12/07 0752) Resp:  [10-20] 20 (12/06 2151) BP: (96-132)/(58-90) 112/77 (12/07 0933) SpO2:  [93 %-100 %] 96 % (12/07 0458)   Current Weight  01/12/18 78 kg       Intake/Output from previous day: 12/06 0701 - 12/07 0700 In: 6060 [P.O.:840; I.V.:500] Out: -    Physical Exam:  Cardiovascular: RRR Pulmonary: Clear to auscultation bilaterally Wounds: Clean and dry.  No erythema or signs of infection.  Lab Results: CBC: Recent Labs    01/11/18 1712 01/13/18 0351  WBC 8.0 9.4  HGB 12.3 10.9*  HCT 39.0 34.1*  PLT 378 359   BMET:  Recent Labs    01/11/18 1712 01/13/18 0351  NA 142 137  K 4.6 4.2  CL 108 99  CO2 25 27  GLUCOSE 137* 158*  BUN 17 12  CREATININE 0.79 1.07*  CALCIUM 9.6 9.1    PT/INR:  Lab Results  Component Value Date   INR 0.84 01/11/2018   INR 1.02 04/15/2015   ABG:  INR: Will add last result for INR, ABG once components are confirmed Will add last 4 CBG results once components are confirmed  Assessment/Plan:  1. CV - SR. On Coreg 3.125 mg bid, Plavix 75 mg daily 2.  Pulmonary - On room air.  3. Anemia-H and H 10.9 and 34.1 4. Hypothyroidism-on Levothyroxine 125 mcg daily 5. Prescription for Oxycodone on chart. Dr. Vivi Martens office will contact patient with follow up appointment 6. Management per primary Lindsay Pettry M ZimmermanPA-C 01/13/2018,10:07 AM [phone]

## 2018-01-13 NOTE — Discharge Instructions (Signed)
Discharge Instructions:  1. You may shower, please wash incisions daily with soap and water and keep dry.  If you wish to cover wounds with dressing you may do so but please keep clean and change daily.  No tub baths or swimming until incisions have completely healed.  If your incisions become red or develop any drainage please call our office at [phone]  2. No Driving until cleared by Dr. Vivi Martens office and you are no longer using narcotic pain medications  4. Fever of 101.5 for at least 24 hours with no source, please contact our office at [phone]  5. Activity- up as tolerated, please walk at least 3 times per day.  Avoid strenuous activity, no lifting, pushing, or pulling with your arms over 8-10 lbs for a minimum of 6 weeks  6. If any questions or concerns arise, please do not hesitate to contact our office at [phone]

## 2018-01-14 DIAGNOSIS — E089 Diabetes mellitus due to underlying condition without complications: Secondary | ICD-10-CM

## 2018-01-14 LAB — CBC
HCT: 36.8 % (ref 36.0–46.0)
Hemoglobin: 11.8 g/dL — ABNORMAL LOW (ref 12.0–15.0)
MCH: 29.1 pg (ref 26.0–34.0)
MCHC: 32.1 g/dL (ref 30.0–36.0)
MCV: 90.6 fL (ref 80.0–100.0)
NRBC: 0 % (ref 0.0–0.2)
Platelets: 393 10*3/uL (ref 150–400)
RBC: 4.06 MIL/uL (ref 3.87–5.11)
RDW: 15.8 % — ABNORMAL HIGH (ref 11.5–15.5)
WBC: 9.2 10*3/uL (ref 4.0–10.5)

## 2018-01-14 LAB — BASIC METABOLIC PANEL
ANION GAP: 12 (ref 5–15)
BUN: 10 mg/dL (ref 6–20)
CO2: 27 mmol/L (ref 22–32)
Calcium: 9.4 mg/dL (ref 8.9–10.3)
Chloride: 99 mmol/L (ref 98–111)
Creatinine, Ser: 0.81 mg/dL (ref 0.44–1.00)
GFR calc non Af Amer: >60 mL/min (ref >60)
Glucose, Bld: 171 mg/dL — ABNORMAL HIGH (ref 70–99)
POTASSIUM: 4.2 mmol/L (ref 3.5–5.1)
Sodium: 138 mmol/L (ref 135–145)

## 2018-01-14 LAB — GLUCOSE, CAPILLARY: GLUCOSE-CAPILLARY: 158 mg/dL — AB (ref 70–99)

## 2018-01-14 MED ORDER — SUCRALFATE 1 GM/10ML PO SUSP: 1.0000 g | Freq: Three times a day (TID) | ORAL | 0 refills | Status: DC

## 2018-01-14 NOTE — Progress Notes (Signed)
Oxy IR 5 mg given x 2 for  8/10 chest pain.

## 2018-01-14 NOTE — Evaluation (Signed)
Physical Therapy Evaluation Patient Details Name: Lindsay Rivera MRN: [id number] DOB: 1960/05/21 Today's Date: 01/14/2018   History of Present Illness    57 year old with past medical history significant for coronary artery disease a status post CABG and PCI, anxiety, bipolar chronic diastolic heart failure, CVA, recently hospitalized 3 weeks ago for chest pain, underwent cardiac cath and recommendation was for medical management. Presented again with chest pain, troponin has been negative seen by cardiology felt that her chest pain could be related to the sternal wire. Underwent sternal wire removal 12/06.     Clinical Impression  Pt with significant history of falls, admitted as above, presents with mild to moderate limitations to functional mobility due to pain, weakness, balance dysfunction resulting in dependency on assistive device and physical assistance for basic mobility needs and inability to return to home alone.  Plans to d/c home with significant other, and will need RW for home use.  Pt poses a serious fall risk as she has hx falls, is worried about her balance and ability to walk, and needs supervision to some assistance for safety when walking.  Reports s/s consistent with vestibular dysfunction but unable to screen for or test due to pain in sternum with position changes.  Pt needs further PT services at d/c to address multiple balance issues, improve ability to move safely, and increase activity to better manage chronic health conditions.  Will need maximum support from [facility] and/or CSW as pt is uninsured currently.  See below for details of exam findings.     Follow Up Recommendations Home health PT    Equipment Recommendations  Rolling walker with 5" wheels    Recommendations for Other Services       Precautions / Restrictions Precautions Precautions: Fall Precaution Comments: hx falls due to instability, up with device Restrictions Weight Bearing Restrictions: No       Mobility  Bed Mobility Overal bed mobility: Independent             General bed mobility comments: attempted sidelying to screen vestibular system, immediate exacerbation w/lean to right and tearful, unable to continue; able to get back into bed scooting and long sit to supine  Transfers Overall transfer level: Needs assistance Equipment used: Rolling walker (2 wheeled) Transfers: Sit to/from Stand Sit to Stand: Min guard         General transfer comment: unsure with first attempt to stand, but able to obtain upright without physical assist and cues to stand for a moment to allow head to clear  Ambulation/Gait Ambulation/Gait assistance: Min guard Gait Distance (Feet): 50 Feet Assistive device: Rolling walker (2 wheeled) Gait Pattern/deviations: Step-through pattern Gait velocity: 0.783 ft/sec Gait velocity interpretation: <1.31 ft/sec, indicative of household ambulator General Gait Details: slow, guarded, occasional cue for posture or RW proximity, one brief knee buckle left self-recovered  Stairs            Wheelchair Mobility    Modified Rankin (Stroke Patients Only)       Balance Overall balance assessment: Needs assistance;History of Falls Sitting-balance support: No upper extremity supported;Feet supported Sitting balance-Leahy Scale: Good     Standing balance support: No upper extremity supported Standing balance-Leahy Scale: Fair Standing balance comment: see rhomberg... able to stand unassisted in normal stance, able to stand EO in feet together x 20 sed         Rhomberg - Eyes Opened: 20 Rhomberg - Eyes Closed: 10(attempted to screen vestib, unable d/t sternal pain)  Standardized Balance Assessment Standardized Balance Assessment : Dynamic Gait Index   Dynamic Gait Index Level Surface: Mild Impairment Change in Gait Speed: Mild Impairment Gait with Horizontal Head Turns: Mild Impairment Gait with Vertical Head Turns: Mild  Impairment Gait and Pivot Turn: Mild Impairment Step Over Obstacle: Moderate Impairment Step Around Obstacles: Mild Impairment Steps: Mild Impairment Total Score: 15       Pertinent Vitals/Pain Pain Assessment: Faces Faces Pain Scale: Hurts whole lot Pain Location: sternum, noting decr pain at rest, incr with attempt at sidelying and with emotional reactions Pain Descriptors / Indicators: Crying;Grimacing;Moaning Pain Intervention(s): Limited activity within patient's tolerance;Monitored during session;Repositioned;Utilized relaxation techniques    Home Living Family/patient expects to be discharged to:: Private residence Living Arrangements: Alone Available Help at Discharge: Family(significant other - going to her home at d/c) Type of Home: House Home Access: Stairs to enter Entrance Stairs-Rails: Can reach both Entrance Stairs-Number of Steps: 2 Home Layout: One level Home Equipment: None Additional Comments: lives normally in apartment alone    Prior Function Level of Independence: Independent               Hand Dominance   Dominant Hand: Right    Extremity/Trunk Assessment   Upper Extremity Assessment Upper Extremity Assessment: Overall WFL for tasks assessed    Lower Extremity Assessment Lower Extremity Assessment: Generalized weakness(some c/o weakness and left knee chronic pain)    Cervical / Trunk Assessment Cervical / Trunk Assessment: Normal  Communication   Communication: No difficulties  Cognition Arousal/Alertness: Awake/alert Behavior During Therapy: WFL for tasks assessed/performed(generally appropriate w/some emotional outbursts) Overall Cognitive Status: Within Functional Limits for tasks assessed                                        General Comments General comments (skin integrity, edema, etc.): incr fall risk due to mulitiple balance issues as noted    Exercises     Assessment/Plan    PT Assessment All further  PT needs can be met in the next venue of care  PT Problem List Pain;Decreased mobility;Decreased balance;Decreased activity tolerance;Decreased strength       PT Treatment Interventions      PT Goals (Current goals can be found in the Care Plan section)  Acute Rehab PT Goals Patient Stated Goal: no pain, no falls PT Goal Formulation: All assessment and education complete, DC therapy Potential to Achieve Goals: Good    Frequency     Barriers to discharge        Co-evaluation               AM-PAC PT "6 Clicks" Mobility  Outcome Measure Help needed turning from your back to your side while in a flat bed without using bedrails?: A Little Help needed moving from lying on your back to sitting on the side of a flat bed without using bedrails?: A Little Help needed moving to and from a bed to a chair (including a wheelchair)?: A Little Help needed standing up from a chair using your arms (e.g., wheelchair or bedside chair)?: A Little Help needed to walk in hospital room?: A Little Help needed climbing 3-5 steps with a railing? : A Little 6 Click Score: 18    End of Session Equipment Utilized During Treatment: Gait belt Activity Tolerance: Patient limited by pain Patient left: in bed;with call bell/phone within reach;with family/visitor present Nurse Communication:  Mobility status PT Visit Diagnosis: Pain;Unsteadiness on feet (R26.81);History of falling (Z91.81);Dizziness and giddiness (R42)    Time: 4835-0757 PT Time Calculation (min) (ACUTE ONLY): 31 min   Charges:   PT Evaluation $PT Eval Low Complexity: 1 Low PT Treatments $Gait Training: 8-22 mins        Kearney Hard, PT, DPT, MS Board Certified Geriatric Clinical Specialist  Herbie Drape 01/14/2018, 10:39 AM

## 2018-01-14 NOTE — Op Note (Signed)
  CARDIOVASCULAR SURGERY OPERATIVE NOTE  01/12/2018  Surgeon:  Gaye Pollack, MD  First Assistant: Delena Serve, PA-C   Preoperative Diagnosis:  Chest wall pain   Postoperative Diagnosis:  Same   Procedure:  Removal of sternal wires   Anesthesia: MAC and local   Clinical History/Surgical Indication:  The patient is a 57 year old woman with a history of hypertension, hyperlipidemia, hypothyroidism, prior stroke, type 2 diabetes, bipolar disorder, and coronary artery disease status post coronary bypass graft surgery in January 2015 in [place]. The patient subsequently moved here and had a cardiac catheterization in March 2017. She was noted to have an atretic LIMA graft to the LAD with patent vein graft to the diagonal and right coronary artery. There is an 85% proximal LAD stenosis which was treated with PCI with a DES. She said that she has done well since then. She was referred to my office by Dr. Acie Fredrickson on 01/03/2018 because she has had persistent chest wall discomfort since her bypass surgery. She describes this as a sometimes aching, sometimes sharp pain around her sternum which is present most of the time but can be made worse with certain movements. Her anterior chest wall remains very sensitive to touch. She takes Tylenol as needed. Unfortunately she presented to [facility] on 01/08/2018 with chest pain and had a negative ECG. She was seen by Cardiology and troponins were negative x 2. Her pain was not felt to be cardiac in origin. I think the best option is to remove her sternal wires since that may be causing her pain which has been present since her surgery. I discussed the operative procedure with her and the possibility that this may not resolve all of her chest discomfort. I discussed the small risk of infection. She agrees to proceed.  Preparation:  The patient was seen  in the preoperative holding area and the correct patient, correct operation were confirmed with the patient after reviewing the medical record. The consent was signed by me. Preoperative antibiotics were given. The patient was taken back to the operating room and positioned supine on the operating room table. After sedation by the anesthesia team the neck and  chest were prepped with betadine soap and solution and draped in the usual sterile manner. A surgical time-out was taken and the correct patient and operative procedure were confirmed with the nursing and anesthesia staff.   Removal of sternal wires:  1% lidocaine local anesthesia was used to anesthetize the skin and subcutaneous tissue on the previous median sternotomy scar.  Four 1 cm incisions were used along the length of the previous scar.  Each incision was carried down to the sternum using electrocautery to expose the sternal wires.  There are 6 sternal wires and each was untwisted and removed without difficulty.  There was complete hemostasis.  The subcutaneous tissue was reapproximated with 3-0 Vicryl suture and the skin closed with a 4-0 Vicryl subcuticular stitch.  Dermabond was applied over the incisions.  The sponge needle and instrument counts were correct according scrub nurse.  Patient was then awakened and transported to the postanesthesia care unit in satisfactory condition.

## 2018-01-14 NOTE — Discharge Summary (Signed)
Physician Discharge Summary   Patient ID: Lindsay Rivera MRN: [id number] DOB/AGE: 1960-03-31 57 y.o.  Admit date: 01/08/2018 Discharge date: 01/14/2018  Primary Care Physician:  Antony Blackbird, MD   Recommendations for Outpatient Follow-up:  1. Follow up with PCP in 1-2 weeks Patient recommended to follow-up with GI and CT surgery outpatient  Home Health: Home health PT OT RN Equipment/Devices:   Discharge Condition: stable CODE STATUS: FULL  Diet recommendation: Carb modified diet   Discharge Diagnoses:    . Atypical chest pain likely related to sternal wire, chest wall pain . Hypothyroid . Essential hypertension . Diabetes mellitus due to underlying condition without complications ([place]) . Coronary artery disease involving native coronary artery of native heart with angina pectoris ([place]) . CAD- CABG x 09 Feb 2013 ([place]) . Bipolar disorder [facility]) GERD  Consults:   Cardiology CT surgery Gastroenterology    Allergies:   Allergies  Allergen Reactions  . Penicillins Swelling    Has patient had a PCN reaction causing immediate rash, facial/tongue/throat swelling, SOB or lightheadedness with hypotension: No Has patient had a PCN reaction causing severe rash involving mucus membranes or skin necrosis: No Has patient had a PCN reaction that required hospitalization No Has patient had a PCN reaction occurring within the last 10 years: No If all of the above answers are "NO", then may proceed with Cephalosporin use.   . Latex Rash     DISCHARGE MEDICATIONS: Allergies as of 01/14/2018      Reactions   Penicillins Swelling   Has patient had a PCN reaction causing immediate rash, facial/tongue/throat swelling, SOB or lightheadedness with hypotension: No Has patient had a PCN reaction causing severe rash involving mucus membranes or skin necrosis: No Has patient had a PCN reaction that required hospitalization No Has patient had a PCN reaction occurring within the last 10  years: No If all of the above answers are "NO", then may proceed with Cephalosporin use.   Latex Rash      Medication List    STOP taking these medications   hydrochlorothiazide 25 MG tablet Commonly known as:  HYDRODIURIL     TAKE these medications   albuterol 108 (90 Base) MCG/ACT inhaler Commonly known as:  PROVENTIL HFA;VENTOLIN HFA INHALE 2 PUFFS INTO THE LUNGS EVERY 6 HOURS AS NEEDED FOR WHEEZING.   amLODipine 5 MG tablet Commonly known as:  NORVASC Take 1 tablet (5 mg total) by mouth daily.   aspirin EC 81 MG tablet Take 1 tablet (81 mg total) by mouth daily.   buPROPion 150 MG 24 hr tablet Commonly known as:  WELLBUTRIN XL TAKE 1 TABLET (150 MG TOTAL) BY MOUTH EVERY EVENING.   Calcium Carbonate-Vitamin D 600-400 MG-UNIT chew tablet Chew 1 tablet by mouth daily. Reported on 03/25/2015   carvedilol 3.125 MG tablet Commonly known as:  COREG Take 1 tablet (3.125 mg total) by mouth 2 (two) times daily.   cetirizine 10 MG tablet Commonly known as:  ZYRTEC Take 1 tablet (10 mg total) by mouth at bedtime. As needed for nasal congestion What changed:    when to take this  reasons to take this   clonazePAM 0.5 MG tablet Commonly known as:  KLONOPIN Take 0.25 mg by mouth 2 (two) times daily as needed for anxiety.   clopidogrel 75 MG tablet Commonly known as:  PLAVIX Take 1 tablet (75 mg total) by mouth daily. Take 4 tablets the first day then 1 tablet daily thereafter   famotidine 20 MG tablet  Commonly known as:  PEPCID Take 20 mg by mouth daily.   fluticasone 50 MCG/ACT nasal spray Commonly known as:  FLONASE Place 2 sprays into both nostrils daily.   glucose blood test strip Use as instructed once per day to check blood sugar   hydrOXYzine 25 MG tablet Commonly known as:  ATARAX/VISTARIL TAKE 1 TABLET BY MOUTH AT BEDTIME.   levothyroxine 125 MCG tablet Commonly known as:  SYNTHROID, LEVOTHROID Take 1 tablet (125 mcg total) by mouth daily.    metFORMIN 500 MG 24 hr tablet Commonly known as:  GLUCOPHAGE-XR Take 1 tablet (500 mg total) by mouth daily with breakfast.   nitroGLYCERIN 0.4 MG SL tablet Commonly known as:  NITROSTAT PLACE 1 TABLET UNDER THE TONGUE EVERY 5 MINS AS NEEDED FOR CHEST PAIN   OLANZapine 20 MG tablet Commonly known as:  ZYPREXA Take 20 mg by mouth at bedtime.   oxyCODONE 5 MG immediate release tablet Commonly known as:  Oxy IR/ROXICODONE Take 1 tablet (5 mg total) by mouth every 4 (four) hours as needed for up to 7 days for severe pain.   pantoprazole 40 MG tablet Commonly known as:  PROTONIX Take 1 tablet (40 mg total) by mouth daily.   polyethylene glycol packet Commonly known as:  MIRALAX / GLYCOLAX Take 17 g by mouth daily as needed for moderate constipation.   potassium chloride 10 MEQ tablet Commonly known as:  K-DUR Take 2 tablets (20 mEq total) by mouth daily.   QUEtiapine 300 MG tablet Commonly known as:  SEROQUEL Take 1 tablet (300 mg total) by mouth at bedtime.   rosuvastatin 20 MG tablet Commonly known as:  CRESTOR Take 1 tablet (20 mg total) by mouth daily.   solifenacin 5 MG tablet Commonly known as:  VESICARE Take 1 tablet (5 mg total) by mouth daily.   sucralfate 1 GM/10ML suspension Commonly known as:  CARAFATE Take 10 mLs (1 g total) by mouth 3 (three) times daily before meals.   TRUE METRIX METER w/Device Kit Use daily to monitor blood sugar   TRUEPLUS LANCETS 28G Misc Use once daily to check blood sugar   valACYclovir 500 MG tablet Commonly known as:  VALTREX Take 1 tablet by mouth daily as needed (prevent infection).        Brief H and P: For complete details please refer to admission H and P, but in brief 57 year old with past medical history significant for coronary artery disease a status post CABG and PCI, anxiety, bipolar chronic diastolic heart failure, CVA, recently hospitalized 3 weeks ago for chest pain, underwent cardiac cath and recommendation  was for medical management. Presented again with chest pain, troponin has been negative seen by cardiology felt that her chest pain could be related to the sternal wire.  Patient transferred to [facility] for further work-up.  Patient also reported diffuse abdominal pain, epigastric, right upper quadrant region.  Hospital Course:  Acute on chronic chest pain in the setting of CAD, CABG  x3v 1/15, -Underwent cardiac cath last admission, was recommended medical management. -Cardiology was consulted, given atypical symptoms with pleuritic component, recommended sternal wire removal, trop x3 remained negative -CT surgery was consulted, patient underwent sternal wire removal on 01/12/2018, still continues to complain of chest pain.  CT angiogram of the chest post sternal wire removal was done which showed no PE. -Patient was also seen by GI, recommended Protonix and Carafate, outpatient follow-up -Today feeling somewhat better, still having a little bit of chest pain.  Nausea vomiting abdominal pain -KUB consistent with constipation, LFTs normal, right upper quadrant ultrasound with pericholecystic fluid, no cholecystitis, lipase 25 -CT abdomen pelvis showed no acute findings GI was consulted, recommended outpatient follow-up and DC on Protonix and Carafate. -Discontinued Toradol due to esophagitis, nausea and vomiting, likely exacerbating GERD    Hypothyroid -Continue Synthroid    Bipolar disorder (HCC) Currently stable, continue bupropion, clonazepam, Seroquel, olanzapine     Diabetes mellitus, type II with history of CAD Patient was placed on sliding scale insulin while inpatient    Essential hypertension Continue Coreg    Day of Discharge S: Feels much better today, chest pain improving, no fever chills, shortness of breath  BP 114/70   Pulse (!) 103   Temp 98.5 F (36.9 C) (Oral)   Resp 16   Ht 5' 3" (1.6 m)   Wt 77.3 kg   SpO2 93%   BMI 30.19 kg/m   Physical  Exam: General: Alert and awake oriented x3 not in any acute distress. HEENT: anicteric sclera, pupils reactive to light and accommodation CVS: S1-S2 clear no murmur rubs or gallops Chest: clear to auscultation bilaterally, no wheezing rales or rhonchi, + reproducible chest wall tenderness Abdomen: soft nontender, nondistended, normal bowel sounds Extremities: no cyanosis, clubbing or edema noted bilaterally Neuro: Cranial nerves II-XII intact, no focal neurological deficits   The results of significant diagnostics from this hospitalization (including imaging, microbiology, ancillary and laboratory) are listed below for reference.      Procedures/Studies:  Dg Chest 2 View  Result Date: 01/11/2018 CLINICAL DATA:  Shortness of breath.  Sternal pain. EXAM: CHEST - 2 VIEW COMPARISON:  January 08, 2018 FINDINGS: Mild bibasilar atelectasis, new in the interval. The heart, hila, mediastinum, lungs, and pleura are otherwise unremarkable. IMPRESSION: Mild bibasilar atelectasis.  No other acute abnormalities. Electronically Signed   By: Dorise Bullion III M.D   On: 01/11/2018 15:44   Dg Chest 2 View  Result Date: 01/08/2018 CLINICAL DATA:  Intermittent left chest pain since yesterday. EXAM: CHEST - 2 VIEW COMPARISON:  11/27/2017 FINDINGS: Previous median sternotomy with CABG procedure. The heart size and mediastinal contours are within normal limits. Both lungs are clear. The visualized skeletal structures are unremarkable. IMPRESSION: No active cardiopulmonary disease. Electronically Signed   By: Kerby Moors M.D.   On: 01/08/2018 17:12   Dg Abd 1 View  Result Date: 01/10/2018 CLINICAL DATA:  Nausea chest pain EXAM: ABDOMEN - 1 VIEW COMPARISON:  02/26/2016 FINDINGS: Moderate stool in the right and transverse colon. Negative for bowel obstruction or ileus. Negative for urinary tract calculi. No acute skeletal abnormality. IMPRESSION: Normal bowel gas pattern. Moderate stool in the right and  transverse colon. Electronically Signed   By: Franchot Gallo M.D.   On: 01/10/2018 11:14   Ct Angio Chest Pe W Or Wo Contrast  Result Date: 01/12/2018 CLINICAL DATA:  Chest pain, elevated D-dimer EXAM: CT ANGIOGRAPHY CHEST WITH CONTRAST TECHNIQUE: Multidetector CT imaging of the chest was performed using the standard protocol during bolus administration of intravenous contrast. Multiplanar CT image reconstructions and MIPs were obtained to evaluate the vascular anatomy. CONTRAST:  55 mL ISOVUE-370 IOPAMIDOL (ISOVUE-370) INJECTION 76% COMPARISON:  04/19/2015 FINDINGS: Cardiovascular: Satisfactory opacification of the pulmonary arteries to the segmental level. No evidence of pulmonary embolism. Normal heart size. No pericardial effusion. Mediastinum/Nodes: No enlarged mediastinal or axillary lymph nodes. Stable 12 mm right hilar lymph node. Thyroid gland, trachea, and esophagus demonstrate no significant findings. Lungs/Pleura: Mild bibasilar atelectasis. No  focal consolidation, pleural effusion or pneumothorax. 4 mm left upper lobe pulmonary nodule. Upper Abdomen: No acute abnormality. Musculoskeletal: No chest wall abnormality. No acute or significant osseous findings. Soft tissue emphysema and haziness in the subcutaneous fat anterior to the sternum consistent with recent sternal wire removal. Review of the MIP images confirms the above findings. IMPRESSION: 1. No evidence pulmonary embolus. 2. Postsurgical changes in the anterior presternal soft tissues from recent sternal wire removal. Electronically Signed   By: Kathreen Devoid   On: 01/12/2018 19:17   Ct Abdomen Pelvis W Contrast  Result Date: 01/10/2018 CLINICAL DATA:  57 year old female with history of epigastric and right upper quadrant abdominal pain. EXAM: CT ABDOMEN AND PELVIS WITH CONTRAST TECHNIQUE: Multidetector CT imaging of the abdomen and pelvis was performed using the standard protocol following bolus administration of intravenous contrast.  CONTRAST:  170m ISOVUE-300 IOPAMIDOL (ISOVUE-300) INJECTION 61%, 346mOMNIPAQUE IOHEXOL 300 MG/ML SOLN COMPARISON:  CT the abdomen and pelvis 12/20/2017. FINDINGS: Lower chest: Scarring in the inferior segment of the lingula and medial aspect of the left lower lobe. Atherosclerotic calcifications in the left anterior descending coronary artery. Abandoned epicardial pacing lead incidentally noted. Median sternotomy wires. Hepatobiliary: No suspicious cystic or solid hepatic lesions. No intra or extrahepatic biliary ductal dilatation. Gallbladder is normal in appearance. Pancreas: No pancreatic mass. No pancreatic ductal dilatation. No pancreatic or peripancreatic fluid or inflammatory changes. Spleen: Unremarkable. Adrenals/Urinary Tract: Subcentimeter low-attenuation lesion in the lower pole the left kidney, too small to characterize, but statistically likely to represent a tiny cyst. Right kidney and bilateral adrenal glands are normal in appearance. No hydroureteronephrosis. Urinary bladder is normal in appearance. Stomach/Bowel: Normal appearance of the stomach. No pathologic dilatation of small bowel or colon. The appendix is not confidently identified and may be surgically absent. Regardless, there are no inflammatory changes noted adjacent to the cecum to suggest the presence of an acute appendicitis at this time. Vascular/Lymphatic: Aortic atherosclerosis, without evidence of aneurysm or dissection in the abdominal or pelvic vasculature. No lymphadenopathy noted in the abdomen or pelvis. Reproductive: Status post hysterectomy. Right ovary is unremarkable in appearance. Left ovary is not confidently identified may be surgically absent or atrophic. Other: No significant volume of ascites.  No pneumoperitoneum. Musculoskeletal: There are no aggressive appearing lytic or blastic lesions noted in the visualized portions of the skeleton. IMPRESSION: 1. No acute findings are noted in the abdomen or pelvis to  account for the patient's symptoms. 2. Aortic atherosclerosis. 3. Additional incidental findings, as above. Electronically Signed   By: DaVinnie Langton.D.   On: 01/10/2018 20:58   Ct Abdomen Pelvis W Contrast  Result Date: 12/21/2017 CLINICAL DATA:  Abdominal bloating for years. Hx of appy. Constipation. 100 ml Isovue 300 given. BUN 12 CRE 0.82 EXAM: CT ABDOMEN AND PELVIS WITH CONTRAST TECHNIQUE: Multidetector CT imaging of the abdomen and pelvis was performed using the standard protocol following bolus administration of intravenous contrast. CONTRAST:  10065mSOVUE-300 IOPAMIDOL (ISOVUE-300) INJECTION 61% COMPARISON:  Abdominal ultrasound on 03/07/2014, CT of the abdomen and pelvis on 05/04 5 FINDINGS: Lower chest: Median sternotomy. Heart size is normal. No pericardial effusion. No pulmonary nodules, pleural effusions, or infiltrates. Hepatobiliary: No focal liver abnormality is seen. No radiopaque gallstones, biliary dilatation, or pericholecystic inflammatory changes. Pancreas: Unremarkable. No pancreatic ductal dilatation or surrounding inflammatory changes. Spleen: Normal in size without focal abnormality. Adrenals/Urinary Tract: Small cysts are identified within the LEFT kidney. There is no hydronephrosis. Adrenal glands are normal in appearance. Ureters  are unremarkable. The bladder and visualized portion of the urethra are normal. Stomach/Bowel: Stomach and small bowel loops are normal in appearance. Appendectomy. Colon is normal in appearance. Vascular/Lymphatic: There is minimal atherosclerotic calcification of the abdominal aorta not associated with aneurysm. There is normal vascular opacification of the celiac axis, superior mesenteric artery, and inferior mesenteric artery. Normal appearance of the portal venous system and inferior vena cava. Reproductive: Hysterectomy.  No adnexal mass. Other: No free pelvic fluid. Musculoskeletal: Mild lumbar spondylosis. No suspicious lytic or blastic  lesions are identified. IMPRESSION: 1. No acute abnormality of the abdomen or pelvis. 2. Median sternotomy. 3.  Aortic atherosclerosis.  (ICD10-I70.0) 4. Small LEFT renal cyst. 5. Hysterectomy.  Appendectomy. 6. Mild lumbar spondylosis. Electronically Signed   By: Nolon Nations M.D.   On: 12/21/2017 08:54   US Abdomen Limited Ruq  Result Date: 01/10/2018 CLINICAL DATA:  57 year old female with a history of nausea EXAM: ULTRASOUND ABDOMEN LIMITED RIGHT UPPER QUADRANT COMPARISON:  CT 12/20/2017 FINDINGS: Gallbladder: Sonographic Murphy's sign recorded negative. No evidence of hyperechoic material within the gallbladder. There is trace pericholecystic fluid. No gallbladder wall thickening or striations. Common bile duct: Diameter: 3 mm Liver: Heterogeneous appearance of liver parenchyma. Portal vein is patent on color Doppler imaging with normal direction of blood flow towards the liver. IMPRESSION: Sonographic survey negative for findings of acute cholecystitis. There is nonspecific trace pericholecystic fluid. If there is ongoing concern, abdominal CT may be useful. Heterogeneous appearance of the liver, suggesting steatosis or other medical liver disease. Electronically Signed   By: Corrie Mckusick D.O.   On: 01/10/2018 15:01       LAB RESULTS: Basic Metabolic Panel: Recent Labs  Lab 01/13/18 0351 01/14/18 0429  NA 137 138  K 4.2 4.2  CL 99 99  CO2 27 27  GLUCOSE 158* 171*  BUN 12 10  CREATININE 1.07* 0.81  CALCIUM 9.1 9.4   Liver Function Tests: Recent Labs  Lab 01/10/18 0934 01/11/18 1712  AST 19 50*  ALT 17 45*  ALKPHOS 80 103  BILITOT 0.2* 0.2*  PROT 6.4* 7.3  ALBUMIN 3.2* 3.6   Recent Labs  Lab 01/10/18 0934  LIPASE 25   No results for input(s): AMMONIA in the last 168 hours. CBC: Recent Labs  Lab 01/13/18 0351 01/14/18 0429  WBC 9.4 9.2  HGB 10.9* 11.8*  HCT 34.1* 36.8  MCV 90.7 90.6  PLT 359 393   Cardiac Enzymes: Recent Labs  Lab 01/09/18 1108  01/09/18 1609  TROPONINI <0.03 <0.03   BNP: Invalid input(s): POCBNP CBG: Recent Labs  Lab 01/13/18 2042 01/14/18 0728  GLUCAP 193* 158*      Disposition and Follow-up: Discharge Instructions    Diet - low sodium heart healthy   Complete by:  As directed    Increase activity slowly   Complete by:  As directed        DISPOSITION: [place]    Richardson Dopp T, PA-C Follow up.   Specialties:  Cardiology, Physician Assistant Why:  Cardiology hospital follow-up on 02/13/2018 3:15 PM on 12/07/2018.  Please arrive 15 minutes early for check-in. Contact information: [address] [place] [address] [phone]        Gaye Pollack, MD Follow up.   Specialty:  Cardiothoracic Surgery Why:  Office will contact you with an appointment date and time Contact information: [place] [address] [phone]  Pyrtle, Lajuan Lines, MD. Schedule an appointment as soon as possible for a visit in 2 week(s).   Specialty:  Gastroenterology Contact information: [address] [place] [address] [phone]            Time coordinating discharge:  35 minutes  Signed:   Estill Cotta M.D. [facility] 01/14/2018, 1:27 PM Pager: [phone]

## 2018-01-14 NOTE — Progress Notes (Signed)
2 Days Post-Op Procedure(s) (LRB): STERNAL WIRES REMOVAL (N/A) Subjective: Still having pain, not significantly different from preop  Objective: Vital signs in last 24 hours: Temp:  [98.5 F (36.9 C)-100.7 F (38.2 C)] 98.5 F (36.9 C) (12/07 2038) Pulse Rate:  [95-103] 103 (12/08 1017) Cardiac Rhythm: Normal sinus rhythm (12/08 0800) Resp:  [16-21] 16 (12/07 2038) BP: (114-135)/(68-72) 114/70 (12/08 1017) SpO2:  [93 %-100 %] 93 % (12/07 2038) Weight:  [77.3 kg] 77.3 kg (12/08 0356)  Hemodynamic parameters for last 24 hours:    Intake/Output from previous day: 12/07 0701 - 12/08 0700 In: 1440 [P.O.:1440] Out: -  Intake/Output this shift: Total I/O In: 240 [P.O.:240] Out: -   Wound: clean and dry  Lab Results: Recent Labs    01/13/18 0351 01/14/18 0429  WBC 9.4 9.2  HGB 10.9* 11.8*  HCT 34.1* 36.8  PLT 359 393   BMET:  Recent Labs    01/13/18 0351 01/14/18 0429  NA 137 138  K 4.2 4.2  CL 99 99  CO2 27 27  GLUCOSE 158* 171*  BUN 12 10  CREATININE 1.07* 0.81  CALCIUM 9.1 9.4    PT/INR:  Recent Labs    01/11/18 1712  LABPROT 11.5  INR 0.84   ABG No results found for: PHART, HCO3, TCO2, ACIDBASEDEF, O2SAT CBG (last 3)  Recent Labs    01/13/18 1712 01/13/18 2042 01/14/18 0728  GLUCAP 194* 193* 158*    Assessment/Plan: S/P Procedure(s) (LRB): STERNAL WIRES REMOVAL (N/A) -Stable from a surgical standpoint  Ok for dc   LOS: 4 days    Lindsay Rivera 01/14/2018

## 2018-01-14 NOTE — Progress Notes (Addendum)
Patient provided with [facility] letter.  Requested [facility] RN PT orders from Dr Tana Coast, requested [facility] to evaluate for charity [facility].

## 2018-01-15 ENCOUNTER — Telehealth: Payer: Self-pay | Admitting: Family Medicine

## 2018-01-15 ENCOUNTER — Telehealth: Payer: Self-pay

## 2018-01-15 ENCOUNTER — Ambulatory Visit: Payer: Medicaid Other | Admitting: Family Medicine

## 2018-01-15 NOTE — Telephone Encounter (Signed)
Transition Care Management Follow-up Telephone Call  Date of discharge and from where: 01/14/2018, [facility]  How have you been since you were released from the hospital?she said that she was doing okay, still sore from the procedure to remove her sternal wires.   Any questions or concerns? She said that she has no questions/concerns at this time other than the cost of the sucralfate.  Items Reviewed:  Did the pt receive and understand the discharge instructions provided? She said that she has the instructions and does not have any questions.   Medications obtained and verified? Yes, all medications obtained except for the sucralfate. She said that the pharmacy told her it would cost $400 and she can't afford that.  Informed her that Dr Chapman Fitch would be notified.  She said that she understands her medication instructions and does not need to review the list.  She said that she understands that she is to stop taking the hydrochlorothiazide and she is taking all of her medications as ordered.   Any new allergies since your discharge? None reported  Do you have support at home? She said that she lives alone but has support from friends/ family. She plans to spend some time with her friend, Joycelyn Schmid.   Other (ie: DME, Home Health, etc) she has a glucometer and scale.  She was not given a RW at discharge. She also said that she has not heard from [facility] about home health services. This CM called [facility], spoke to [place] who said that they have orders for SN, PT and SW but have not yet contacted that patient.  Also informed her that the patient was not given a RW at discharge and she said that the PT can assess for a RW.  The information from this call with [facility] was shared with the patient    Functional Questionnaire: (I = Independent and D = Dependent) ADL's: independent    Follow up appointments reviewed:    PCP Hospital f/u appt confirmed? She scheduled an appointment for 01/22/18 @ 0950.  She  had an appointment scheduled for today but needed to be rescheduled as provider was out of the office  [facility] f/u appt confirmed? She said that she knows that she needs to schedule an appointment with her cardiothoracic surgeon as well as GI and she has the contact numbers.  Are transportation arrangements needed?  She said that she will need to have a friend bring her to the clinic.   If their condition worsens, is the pt aware to call  their PCP or go to the ED? yes  Was the patient provided with contact information for the PCP's office or ED? Yes, she said that she has the number  Was the pt encouraged to call back with questions or concerns? Yes  The patient came to [facility] and  brought her disability application for Dr Chapman Fitch to complete.   She has an [place].

## 2018-01-15 NOTE — Telephone Encounter (Signed)
Patient came and dropped off paper work for disability

## 2018-01-16 NOTE — Telephone Encounter (Signed)
I am not sure that I received any disability paper work regarding this patient but I will check my mailbox

## 2018-01-17 ENCOUNTER — Telehealth: Payer: Self-pay

## 2018-01-17 NOTE — Telephone Encounter (Signed)
Call received from the patient stating that she does not have transportation to the clinic for her appointment on 01/22/18.  Informed her that [facility] can provide cab transport for her and she was in agreement. Address confirmed.    [place], [place] office team lead notified of the need for cab.

## 2018-01-18 MED FILL — FLUoxetine HCL 10 MG CAPS: 10 | 30 days supply | Qty: 30 | Fill #0

## 2018-01-18 MED FILL — OLANZapine 15 MG TABS: 15 | 30 days supply | Qty: 60 | Fill #0

## 2018-01-22 ENCOUNTER — Telehealth: Payer: Self-pay | Admitting: Family Medicine

## 2018-01-22 ENCOUNTER — Ambulatory Visit: Payer: No Typology Code available for payment source | Admitting: Family Medicine

## 2018-01-22 NOTE — Telephone Encounter (Signed)
[facility] called to request verbal Orders for Skilled Nursing  1w9 Please follow up [phone] p

## 2018-01-23 ENCOUNTER — Other Ambulatory Visit: Payer: Self-pay | Admitting: Surgery

## 2018-01-23 DIAGNOSIS — I2510 Atherosclerotic heart disease of native coronary artery without angina pectoris: Secondary | ICD-10-CM

## 2018-01-23 NOTE — Telephone Encounter (Signed)
MA provided VO for skilled nursing. Patient now needs to be scheduled for an OV to address completion of forms with PCP.

## 2018-01-23 NOTE — Telephone Encounter (Signed)
Patient was called to inform that an OV needs to be scheduled. Patient has an appointment scheduled 12/20 @10 :50.  Patient says she needs paperwork to be filled out the day of the appointment because she has to go to court.

## 2018-01-24 ENCOUNTER — Encounter: Payer: Self-pay | Admitting: Surgery

## 2018-01-24 ENCOUNTER — Telehealth: Payer: Self-pay | Admitting: Family Medicine

## 2018-01-24 ENCOUNTER — Ambulatory Visit: Payer: No Typology Code available for payment source | Admitting: Physician Assistant

## 2018-01-24 ENCOUNTER — Telehealth: Payer: Self-pay | Admitting: Cardiovascular Disease

## 2018-01-24 NOTE — Telephone Encounter (Signed)
Physical therapist Amber called to request verbal orders for -2w3 This will help her strength, balance and endurance  Please follow up  [phone]

## 2018-01-24 NOTE — Telephone Encounter (Signed)
Called patient to discuss appointment. I asked patient if she was aware she had an appointment with Dr. Vivi Martens office today at 82 and she states she was not aware. I advised her to call [facility] to reschedule and gave her the phone number. I advised that the appointment with the CT surgeon is needed prior to visit with our office and that we will leave her appointment on 1/7 unless we otherwise from her or from Dr. Cyndia Bent. She verbalized understanding and agreement and thanked me for the call.

## 2018-01-24 NOTE — Telephone Encounter (Signed)
New Message        Patient is calling today to get a hosp f/u, there are no availability from Dr. Acie Fredrickson to the care team until 03/08/2018, Per patient she was suppose to come back for the f/u as soon as possible. Pls call and advise.

## 2018-01-25 ENCOUNTER — Telehealth: Payer: Self-pay | Admitting: Family Medicine

## 2018-01-25 MED FILL — ACYCLOVIR 400 MG TABLET: 400 | 5 days supply | Qty: 20 | Fill #4

## 2018-01-25 MED FILL — CLOPIDOGREL 75 MG TABLET: 75 | 30 days supply | Qty: 30 | Fill #3

## 2018-01-25 MED FILL — POTASSIUM CL ER 10 MEQ TAB: 10 | 30 days supply | Qty: 60 | Fill #3

## 2018-01-25 MED FILL — NITROGLYCERIN 0.4 MG TAB SL: 0.4 | 25 days supply | Qty: 25 | Fill #3

## 2018-01-25 MED FILL — ATORVASTATIN 80 MG TABLET: 80 | 30 days supply | Qty: 30 | Fill #1

## 2018-01-25 MED FILL — METFORMIN HCL ER 500 MG TAB: 500 | 30 days supply | Qty: 30 | Fill #3

## 2018-01-25 MED FILL — HYDROCHLOROTHIAZIDE 25 MG T: 25 | 30 days supply | Qty: 30 | Fill #2

## 2018-01-25 MED FILL — PANTOPRAZOLE SOD DR 40 MG T: 40 | 30 days supply | Qty: 30 | Fill #3

## 2018-01-25 MED FILL — ?LEVOTHYROXINE 125 MCG TABL: 125 | 30 days supply | Qty: 30 | Fill #3

## 2018-01-25 MED FILL — ?CARVEDILOL 3.125 MG TABLET: 3.125 | 30 days supply | Qty: 60 | Fill #4

## 2018-01-25 MED FILL — ROSUVASTATIN CALCIUM 20 MG: 20 | 30 days supply | Qty: 30 | Fill #1

## 2018-01-25 MED FILL — ?VALACYCLOVIR HCL 500MG TAB: 500 | 3 days supply | Qty: 6 | Fill #3

## 2018-01-25 MED FILL — BUPROPION HCL XL 150 MG TAB: 150 | 30 days supply | Qty: 30 | Fill #2

## 2018-01-25 NOTE — Telephone Encounter (Signed)
Lindsay Rivera from [place] called to request verbal orders for Occupational therapy -1w3 She is aware [name] called the day prior to request different orders. Please follow up [phone]

## 2018-01-26 ENCOUNTER — Ambulatory Visit: Payer: Medicaid Other | Attending: Family Medicine | Admitting: Family Medicine

## 2018-01-26 ENCOUNTER — Encounter: Payer: Self-pay | Admitting: Family Medicine

## 2018-01-26 VITALS — BP 129/72 | HR 65 | Temp 98.2°F | Resp 18 | Ht 64.0 in | Wt 175.0 lb

## 2018-01-26 DIAGNOSIS — M5440 Lumbago with sciatica, unspecified side: Secondary | ICD-10-CM

## 2018-01-26 DIAGNOSIS — Z79899 Other long term (current) drug therapy: Secondary | ICD-10-CM | POA: Insufficient documentation

## 2018-01-26 DIAGNOSIS — Z880 Allergy status to penicillin: Secondary | ICD-10-CM | POA: Diagnosis not present

## 2018-01-26 DIAGNOSIS — Z951 Presence of aortocoronary bypass graft: Secondary | ICD-10-CM | POA: Diagnosis not present

## 2018-01-26 DIAGNOSIS — Z7984 Long term (current) use of oral hypoglycemic drugs: Secondary | ICD-10-CM | POA: Insufficient documentation

## 2018-01-26 DIAGNOSIS — Z79890 Hormone replacement therapy (postmenopausal): Secondary | ICD-10-CM | POA: Diagnosis not present

## 2018-01-26 DIAGNOSIS — I110 Hypertensive heart disease with heart failure: Secondary | ICD-10-CM | POA: Diagnosis not present

## 2018-01-26 DIAGNOSIS — Z8673 Personal history of transient ischemic attack (TIA), and cerebral infarction without residual deficits: Secondary | ICD-10-CM | POA: Insufficient documentation

## 2018-01-26 DIAGNOSIS — R350 Frequency of micturition: Secondary | ICD-10-CM

## 2018-01-26 DIAGNOSIS — Z8249 Family history of ischemic heart disease and other diseases of the circulatory system: Secondary | ICD-10-CM | POA: Insufficient documentation

## 2018-01-26 DIAGNOSIS — E119 Type 2 diabetes mellitus without complications: Secondary | ICD-10-CM

## 2018-01-26 DIAGNOSIS — Z955 Presence of coronary angioplasty implant and graft: Secondary | ICD-10-CM | POA: Insufficient documentation

## 2018-01-26 DIAGNOSIS — I25119 Atherosclerotic heart disease of native coronary artery with unspecified angina pectoris: Secondary | ICD-10-CM | POA: Diagnosis not present

## 2018-01-26 DIAGNOSIS — E785 Hyperlipidemia, unspecified: Secondary | ICD-10-CM | POA: Insufficient documentation

## 2018-01-26 DIAGNOSIS — J45909 Unspecified asthma, uncomplicated: Secondary | ICD-10-CM | POA: Insufficient documentation

## 2018-01-26 DIAGNOSIS — I509 Heart failure, unspecified: Secondary | ICD-10-CM | POA: Diagnosis not present

## 2018-01-26 DIAGNOSIS — R110 Nausea: Secondary | ICD-10-CM

## 2018-01-26 DIAGNOSIS — Z7982 Long term (current) use of aspirin: Secondary | ICD-10-CM | POA: Diagnosis not present

## 2018-01-26 DIAGNOSIS — F419 Anxiety disorder, unspecified: Secondary | ICD-10-CM | POA: Insufficient documentation

## 2018-01-26 DIAGNOSIS — G8929 Other chronic pain: Secondary | ICD-10-CM | POA: Diagnosis not present

## 2018-01-26 DIAGNOSIS — K219 Gastro-esophageal reflux disease without esophagitis: Secondary | ICD-10-CM | POA: Insufficient documentation

## 2018-01-26 DIAGNOSIS — F319 Bipolar disorder, unspecified: Secondary | ICD-10-CM | POA: Diagnosis not present

## 2018-01-26 DIAGNOSIS — M545 Low back pain, unspecified: Secondary | ICD-10-CM

## 2018-01-26 DIAGNOSIS — I252 Old myocardial infarction: Secondary | ICD-10-CM | POA: Insufficient documentation

## 2018-01-26 DIAGNOSIS — E089 Diabetes mellitus due to underlying condition without complications: Secondary | ICD-10-CM

## 2018-01-26 DIAGNOSIS — E039 Hypothyroidism, unspecified: Secondary | ICD-10-CM | POA: Diagnosis not present

## 2018-01-26 DIAGNOSIS — M1990 Unspecified osteoarthritis, unspecified site: Secondary | ICD-10-CM | POA: Insufficient documentation

## 2018-01-26 DIAGNOSIS — R1084 Generalized abdominal pain: Secondary | ICD-10-CM

## 2018-01-26 DIAGNOSIS — Z87891 Personal history of nicotine dependence: Secondary | ICD-10-CM | POA: Insufficient documentation

## 2018-01-26 DIAGNOSIS — K5900 Constipation, unspecified: Secondary | ICD-10-CM | POA: Diagnosis not present

## 2018-01-26 DIAGNOSIS — H547 Unspecified visual loss: Secondary | ICD-10-CM

## 2018-01-26 LAB — POCT URINALYSIS DIP (CLINITEK)
Bilirubin, UA: NEGATIVE
Blood, UA: NEGATIVE
Glucose, UA: NEGATIVE mg/dL
Ketones, POC UA: NEGATIVE mg/dL
Leukocytes, UA: NEGATIVE
Nitrite, UA: NEGATIVE
POC PROTEIN,UA: NEGATIVE
Spec Grav, UA: 1.015
Urobilinogen, UA: 1 U/dL
pH, UA: 7.5

## 2018-01-26 LAB — GLUCOSE, POCT (MANUAL RESULT ENTRY): POC Glucose: 114 mg/dL — AB (ref 70–99)

## 2018-01-26 MED ORDER — ONDANSETRON HCL 4 MG PO TABS: 4.0000 mg | ORAL_TABLET | Freq: Three times a day (TID) | ORAL | 0 refills | Status: DC | PRN

## 2018-01-26 MED ORDER — LACTULOSE 10 GM/15ML PO SOLN: 30.0000 g | Freq: Two times a day (BID) | ORAL | 1 refills | Status: DC | PRN

## 2018-01-26 MED FILL — LACTULOSE 10 GM/15 ML SOLN: 10 | 2 days supply | Qty: 240 | Fill #0

## 2018-01-26 MED FILL — ONDANSETRON HCL 4 MG TABLET: 4 | 6 days supply | Qty: 20 | Fill #0

## 2018-01-26 NOTE — Patient Instructions (Signed)
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
## 2018-01-29 NOTE — Telephone Encounter (Signed)
MA was able to provide those orders for OT and PT.

## 2018-01-29 NOTE — Telephone Encounter (Signed)
[facility] needs OT and PT orders approved

## 2018-02-01 ENCOUNTER — Telehealth: Payer: Self-pay

## 2018-02-01 NOTE — Telephone Encounter (Signed)
Statement of medical necessity for [facility] faxed to [facility]

## 2018-02-02 NOTE — Telephone Encounter (Signed)
Bethany from [place] called to let pt. PCP know that she canceled her visit due to not feeling well.

## 2018-02-05 ENCOUNTER — Emergency Department (HOSPITAL_COMMUNITY)
Admission: EM | Admit: 2018-02-05 | Discharge: 2018-02-05 | Discharge status: Against Medical Advice | Payer: No Typology Code available for payment source

## 2018-02-06 ENCOUNTER — Telehealth: Payer: Self-pay | Admitting: Family Medicine

## 2018-02-06 NOTE — Telephone Encounter (Signed)
Please advise on this concern.

## 2018-02-06 NOTE — Telephone Encounter (Signed)
Lindsay Rivera says that patient has a red spot on the inside of her right heel that is swollen painful and red. Lindsay Rivera states that during treatment for over 40 min the spot got larger.   Please follow up.

## 2018-02-06 NOTE — Telephone Encounter (Signed)
Please ask that patient be assessed at urgent care

## 2018-02-08 ENCOUNTER — Ambulatory Visit (HOSPITAL_COMMUNITY)
Admission: EM | Admit: 2018-02-08 | Discharge: 2018-02-08 | Disposition: A | Discharge status: Home / Self Care | Payer: Medicaid Other | Attending: Family Medicine | Admitting: Family Medicine

## 2018-02-08 ENCOUNTER — Encounter (HOSPITAL_COMMUNITY): Payer: Self-pay

## 2018-02-08 ENCOUNTER — Ambulatory Visit (INDEPENDENT_AMBULATORY_CARE_PROVIDER_SITE_OTHER): Payer: Medicaid Other

## 2018-02-08 DIAGNOSIS — M25562 Pain in left knee: Secondary | ICD-10-CM

## 2018-02-08 DIAGNOSIS — M79671 Pain in right foot: Secondary | ICD-10-CM

## 2018-02-08 DIAGNOSIS — M1712 Unilateral primary osteoarthritis, left knee: Secondary | ICD-10-CM | POA: Insufficient documentation

## 2018-02-08 MED ORDER — TRAMADOL HCL 50 MG PO TABS: 50.0000 mg | ORAL_TABLET | Freq: Four times a day (QID) | ORAL | 0 refills | Status: DC | PRN

## 2018-02-08 MED ORDER — MELOXICAM 15 MG PO TABS: 15.0000 mg | ORAL_TABLET | Freq: Every day | ORAL | 0 refills | Status: DC

## 2018-02-08 NOTE — Discharge Instructions (Addendum)
[facility] am [facility] I am sending tramadol for more severe pain to [facility] and elevate the knee as much as possible Follow-up with your doctor as planned

## 2018-02-08 NOTE — ED Triage Notes (Signed)
Pt presents with right foot pain; pt has a knot and bruising by the ankle where it is hurting.

## 2018-02-09 ENCOUNTER — Other Ambulatory Visit: Payer: Self-pay | Admitting: Family Medicine

## 2018-02-09 ENCOUNTER — Encounter: Payer: Self-pay | Admitting: Physical Medicine & Rehabilitation

## 2018-02-09 ENCOUNTER — Ambulatory Visit: Payer: Medicaid Other | Attending: Family Medicine | Admitting: Family Medicine

## 2018-02-09 VITALS — BP 117/73 | HR 70 | Temp 97.7°F | Ht 64.0 in | Wt 180.0 lb

## 2018-02-09 DIAGNOSIS — I252 Old myocardial infarction: Secondary | ICD-10-CM | POA: Diagnosis not present

## 2018-02-09 DIAGNOSIS — Z8249 Family history of ischemic heart disease and other diseases of the circulatory system: Secondary | ICD-10-CM | POA: Diagnosis not present

## 2018-02-09 DIAGNOSIS — K5900 Constipation, unspecified: Secondary | ICD-10-CM | POA: Diagnosis not present

## 2018-02-09 DIAGNOSIS — Z79890 Hormone replacement therapy (postmenopausal): Secondary | ICD-10-CM | POA: Diagnosis not present

## 2018-02-09 DIAGNOSIS — Z7984 Long term (current) use of oral hypoglycemic drugs: Secondary | ICD-10-CM | POA: Insufficient documentation

## 2018-02-09 DIAGNOSIS — Z955 Presence of coronary angioplasty implant and graft: Secondary | ICD-10-CM | POA: Diagnosis not present

## 2018-02-09 DIAGNOSIS — G479 Sleep disorder, unspecified: Secondary | ICD-10-CM | POA: Diagnosis not present

## 2018-02-09 DIAGNOSIS — E119 Type 2 diabetes mellitus without complications: Secondary | ICD-10-CM | POA: Diagnosis not present

## 2018-02-09 DIAGNOSIS — I509 Heart failure, unspecified: Secondary | ICD-10-CM | POA: Diagnosis not present

## 2018-02-09 DIAGNOSIS — I110 Hypertensive heart disease with heart failure: Secondary | ICD-10-CM | POA: Insufficient documentation

## 2018-02-09 DIAGNOSIS — Z91040 Latex allergy status: Secondary | ICD-10-CM | POA: Insufficient documentation

## 2018-02-09 DIAGNOSIS — K219 Gastro-esophageal reflux disease without esophagitis: Secondary | ICD-10-CM | POA: Diagnosis not present

## 2018-02-09 DIAGNOSIS — E785 Hyperlipidemia, unspecified: Secondary | ICD-10-CM | POA: Insufficient documentation

## 2018-02-09 DIAGNOSIS — E039 Hypothyroidism, unspecified: Secondary | ICD-10-CM

## 2018-02-09 DIAGNOSIS — Z833 Family history of diabetes mellitus: Secondary | ICD-10-CM | POA: Diagnosis not present

## 2018-02-09 DIAGNOSIS — F419 Anxiety disorder, unspecified: Secondary | ICD-10-CM | POA: Diagnosis not present

## 2018-02-09 DIAGNOSIS — Z8673 Personal history of transient ischemic attack (TIA), and cerebral infarction without residual deficits: Secondary | ICD-10-CM | POA: Insufficient documentation

## 2018-02-09 DIAGNOSIS — M25562 Pain in left knee: Secondary | ICD-10-CM

## 2018-02-09 DIAGNOSIS — R0683 Snoring: Secondary | ICD-10-CM | POA: Insufficient documentation

## 2018-02-09 DIAGNOSIS — M1712 Unilateral primary osteoarthritis, left knee: Secondary | ICD-10-CM | POA: Diagnosis not present

## 2018-02-09 DIAGNOSIS — Z87891 Personal history of nicotine dependence: Secondary | ICD-10-CM | POA: Diagnosis not present

## 2018-02-09 DIAGNOSIS — Z880 Allergy status to penicillin: Secondary | ICD-10-CM | POA: Insufficient documentation

## 2018-02-09 DIAGNOSIS — R1310 Dysphagia, unspecified: Secondary | ICD-10-CM | POA: Diagnosis not present

## 2018-02-09 DIAGNOSIS — Z800 Family history of malignant neoplasm of digestive organs: Secondary | ICD-10-CM | POA: Diagnosis not present

## 2018-02-09 DIAGNOSIS — F319 Bipolar disorder, unspecified: Secondary | ICD-10-CM | POA: Insufficient documentation

## 2018-02-09 DIAGNOSIS — E089 Diabetes mellitus due to underlying condition without complications: Secondary | ICD-10-CM

## 2018-02-09 DIAGNOSIS — E1169 Type 2 diabetes mellitus with other specified complication: Secondary | ICD-10-CM

## 2018-02-09 DIAGNOSIS — Z951 Presence of aortocoronary bypass graft: Secondary | ICD-10-CM | POA: Insufficient documentation

## 2018-02-09 LAB — GLUCOSE, POCT (MANUAL RESULT ENTRY): POC Glucose: 113 mg/dL — AB (ref 70–99)

## 2018-02-09 MED ORDER — TRAMADOL HCL 50 MG PO TABS: 50.0000 mg | ORAL_TABLET | Freq: Four times a day (QID) | ORAL | 0 refills | Status: DC | PRN

## 2018-02-09 MED FILL — MELOXICAM 15 MG TABLET: 15 | 30 days supply | Qty: 30 | Fill #0

## 2018-02-09 NOTE — Progress Notes (Signed)
Patient needs a sleep Apnea test.  Referral for ophthalmology needs to be re opened.
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
## 2018-02-09 NOTE — Progress Notes (Signed)
Patient ID: Lindsay Rivera, female   DOB: April 11, 1960, 58 y.o.   MRN: [id number]    Patient was seen yesterday at the [facility] urgent care for left knee pain and was prescribed Mobic and Ultram/tramadol.  Patient was seen here at the office for a different issue but requested that tramadol be sent to this pharmacy as the urgent care had [facility]ad[facility].  Patient was prescribed tramadol 50 mg 1 every 6 hours as needed #15 per urgent care and this will be sent to the pharmacy in this building and CMA will call to [facility].

## 2018-02-10 LAB — TSH+FREE T4
Free T4: 1.47 ng/dL (ref 0.82–1.77)
TSH: 0.348 u[IU]/mL — ABNORMAL LOW (ref 0.450–4.500)

## 2018-02-12 ENCOUNTER — Telehealth: Payer: Self-pay

## 2018-02-12 ENCOUNTER — Telehealth: Payer: Self-pay | Admitting: Family Medicine

## 2018-02-12 MED FILL — traMADol HCL 50 MG TABS: 50 | 3 days supply | Qty: 15 | Fill #0

## 2018-02-12 NOTE — Telephone Encounter (Signed)
Call placed to [facility], spoke to [place] who stated that the tub seat has been delivered to the patient.

## 2018-02-12 NOTE — Telephone Encounter (Signed)
Pt called to request  -traMADol (ULTRAM) 50 MG tablet  To -[facility] pharmacy Until her Pain Management appointment which she states is on 03/12/2018. And her lab work results

## 2018-02-12 NOTE — Telephone Encounter (Signed)
Signed home health orders faxed to [facility]
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
## 2018-02-13 ENCOUNTER — Other Ambulatory Visit: Payer: Self-pay | Admitting: Surgery

## 2018-02-13 ENCOUNTER — Ambulatory Visit: Payer: No Typology Code available for payment source | Admitting: Physician Assistant

## 2018-02-13 DIAGNOSIS — I25110 Atherosclerotic heart disease of native coronary artery with unstable angina pectoris: Secondary | ICD-10-CM

## 2018-02-13 MED FILL — OLANZapine 15 MG TABS: 15 | 30 days supply | Qty: 60 | Fill #0

## 2018-02-13 MED FILL — FLUoxetine HCL 10 MG CAPS: 10 | 30 days supply | Qty: 30 | Fill #0

## 2018-02-13 NOTE — Telephone Encounter (Signed)
Patient tramadol was filled same day as visit. Please have patient report to the pharmacy.

## 2018-02-14 ENCOUNTER — Ambulatory Visit (INDEPENDENT_AMBULATORY_CARE_PROVIDER_SITE_OTHER): Payer: Self-pay | Admitting: Physician Assistant

## 2018-02-14 ENCOUNTER — Ambulatory Visit: Payer: Self-pay | Attending: Family Medicine

## 2018-02-14 ENCOUNTER — Encounter: Payer: Self-pay | Admitting: Surgery

## 2018-02-14 ENCOUNTER — Encounter: Payer: Self-pay | Admitting: Physician Assistant

## 2018-02-14 VITALS — BP 132/82 | HR 71 | Ht 64.0 in | Wt 180.8 lb

## 2018-02-14 DIAGNOSIS — E785 Hyperlipidemia, unspecified: Secondary | ICD-10-CM

## 2018-02-14 DIAGNOSIS — Z9861 Coronary angioplasty status: Secondary | ICD-10-CM

## 2018-02-14 DIAGNOSIS — Z111 Encounter for screening for respiratory tuberculosis: Secondary | ICD-10-CM

## 2018-02-14 DIAGNOSIS — R072 Precordial pain: Secondary | ICD-10-CM

## 2018-02-14 DIAGNOSIS — I2510 Atherosclerotic heart disease of native coronary artery without angina pectoris: Secondary | ICD-10-CM

## 2018-02-14 NOTE — Patient Instructions (Signed)
Medication Instructions:  Your physician recommends that you continue on your current medications as directed. Please refer to the Current Medication list given to you today.  If you need a refill on your cardiac medications before your next appointment, please call your pharmacy.   Lab work: Keep fasting lab appointment for 03/29/18 If you have labs (blood work) drawn today and your tests are completely normal, you will receive your results only by: Marland Kitchen MyChart Message (if you have MyChart) OR . A paper copy in the mail If you have any lab test that is abnormal or we need to change your treatment, we will call you to review the results.  Testing/Procedures: None ordred  Follow-Up: At [facility], you and your health needs are our priority.  As part of our continuing mission to provide you with exceptional heart care, we have created designated Provider Care Teams.  These Care Teams include your primary Cardiologist (physician) and Advanced Practice Providers (APPs -  Physician Assistants and Nurse Practitioners) who all work together to provide you with the care you need, when you need it. You will need a follow up appointment in:  4 months.  Please call our office 2 months in advance to schedule this appointment.  You may see Mertie Moores, MD or one of the following Advanced Practice Providers on your designated Care Team: Richardson Dopp, PA-C [place], [place] . Daune Perch, NP  Any Other Special Instructions Will Be Listed Below (If Applicable).

## 2018-02-14 NOTE — Progress Notes (Signed)
Patient came into to clinic today to receive TB skin test. Patient was given skin test in left arm.
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
## 2018-02-16 ENCOUNTER — Encounter: Payer: Self-pay | Admitting: Surgery

## 2018-02-16 ENCOUNTER — Ambulatory Visit: Payer: Self-pay | Attending: Family Medicine

## 2018-02-16 LAB — TB SKIN TEST: TB Skin Test: NEGATIVE

## 2018-02-21 NOTE — Telephone Encounter (Signed)
Patient verified DOB Patient is aware of results. 

## 2018-02-21 NOTE — Telephone Encounter (Signed)
-----   Message from Antony Blackbird, MD sent at 02/11/2018  6:11 PM EST ----- TSH is now slightly below normal. She can continue her current dose and recheck in 3-4 months but if she is having unexplained weight loss or sensation of her heart racing/elevated heart rate then her dose needs to be decreased

## 2018-02-26 ENCOUNTER — Other Ambulatory Visit: Payer: Self-pay | Admitting: Family Medicine

## 2018-02-26 ENCOUNTER — Other Ambulatory Visit: Payer: Self-pay | Admitting: Cardiovascular Disease

## 2018-02-26 DIAGNOSIS — T148XXA Other injury of unspecified body region, initial encounter: Secondary | ICD-10-CM

## 2018-02-26 MED FILL — ?CARVEDILOL 3.125 MG TABLET: 3.125 | 30 days supply | Qty: 60 | Fill #5

## 2018-02-26 MED FILL — ?PANTOPRAZOLE SO DR 40MG TA: 40 | 30 days supply | Qty: 30 | Fill #4

## 2018-02-26 MED FILL — ?AMLODIPINE BESYLATE 5 MG T: 5 MG | 30 days supply | Qty: 30 | Fill #1

## 2018-02-26 MED FILL — POTASSIUM CL ER 10 MEQ TAB: 10 | 30 days supply | Qty: 60 | Fill #4

## 2018-02-26 MED FILL — ROSUVASTATIN CALCIUM 20 MG: 20 | 30 days supply | Qty: 30 | Fill #2

## 2018-02-26 MED FILL — CLOPIDOGREL 75 MG TABLET: 75 | 30 days supply | Qty: 30 | Fill #4

## 2018-02-26 MED FILL — ?VALACYCLOVIR HCL 500MG TAB: 500 | 3 days supply | Qty: 6 | Fill #4

## 2018-02-26 MED FILL — LACTULOSE 10 GM/15 ML SOLN: 10 | 2 days supply | Qty: 240 | Fill #1

## 2018-02-26 MED FILL — ?LEVOTHYROXINE 125 MCG TABL: 125 | 30 days supply | Qty: 30 | Fill #4

## 2018-02-26 MED FILL — ATORVASTATIN 80 MG TABLET: 80 | 30 days supply | Qty: 30 | Fill #2

## 2018-02-26 MED FILL — ?METFORMIN HCL ER 500MG TAB: 500 | 30 days supply | Qty: 30 | Fill #4

## 2018-03-07 ENCOUNTER — Encounter: Payer: Self-pay | Admitting: *Deleted

## 2018-03-07 MED FILL — QUETIAPINE FUMARATE 300 MG: 300 | 30 days supply | Qty: 30 | Fill #0

## 2018-03-09 ENCOUNTER — Ambulatory Visit: Payer: Medicaid Other | Attending: Family Medicine | Admitting: Family Medicine

## 2018-03-09 DIAGNOSIS — Z5321 Procedure and treatment not carried out due to patient leaving prior to being seen by health care provider: Secondary | ICD-10-CM

## 2018-03-09 NOTE — Progress Notes (Signed)
Patient is aware of results and is being seen on today to follow up.

## 2018-03-09 NOTE — Progress Notes (Signed)
Patient left without being seen. Did not want to be seen. She was upset that she had to wait so long. She was only here to pick up paperwork. Patient was given the forms and copies were made and asked front office staff to scan them into patients chart. Nat Christen, CMA

## 2018-03-12 ENCOUNTER — Ambulatory Visit: Payer: Self-pay | Admitting: Physical Medicine & Rehabilitation

## 2018-03-12 ENCOUNTER — Encounter: Payer: Self-pay | Attending: Physical Medicine & Rehabilitation

## 2018-03-12 DIAGNOSIS — G8929 Other chronic pain: Secondary | ICD-10-CM | POA: Insufficient documentation

## 2018-03-12 DIAGNOSIS — M533 Sacrococcygeal disorders, not elsewhere classified: Secondary | ICD-10-CM | POA: Insufficient documentation

## 2018-03-12 MED FILL — FLUoxetine HCL 10 MG CAPS: 10 | 30 days supply | Qty: 30 | Fill #1

## 2018-03-19 ENCOUNTER — Ambulatory Visit (INDEPENDENT_AMBULATORY_CARE_PROVIDER_SITE_OTHER): Payer: Self-pay | Admitting: Internal Medicine

## 2018-03-19 ENCOUNTER — Encounter: Payer: Self-pay | Admitting: Internal Medicine

## 2018-03-19 VITALS — BP 98/62 | HR 65 | Ht 62.0 in | Wt 179.0 lb

## 2018-03-19 DIAGNOSIS — Z86010 Personal history of colonic polyps: Secondary | ICD-10-CM

## 2018-03-19 DIAGNOSIS — K219 Gastro-esophageal reflux disease without esophagitis: Secondary | ICD-10-CM

## 2018-03-19 DIAGNOSIS — R1310 Dysphagia, unspecified: Secondary | ICD-10-CM

## 2018-03-19 DIAGNOSIS — K5904 Chronic idiopathic constipation: Secondary | ICD-10-CM

## 2018-03-19 MED ORDER — LINACLOTIDE 145 MCG PO CAPS: 145.0000 ug | ORAL_CAPSULE | Freq: Every day | ORAL | 2 refills | Status: DC
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
## 2018-03-19 NOTE — Patient Instructions (Signed)
Continue pantoprazole.  We have sent the following medications to your pharmacy for you to pick up at your convenience: Linzess 145 mcg daily  You will be due for a recall colonoscopy in 10/2018. We will send you a reminder in the mail when it gets closer to that time.  You have been scheduled for a Barium Esophogram at [facility] (1st floor of the hospital) on Thursday 03/22/2018 at 11:00 am. Please arrive 15 minutes prior to your appointment for registration. Make certain not to have anything to eat or drink 3 hours prior to your test. If you need to reschedule for any reason, please contact radiology at [phone] to do so. _______________________________________________________ A barium swallow is an examination that concentrates on views of the esophagus. This tends to be a double contrast exam (barium and two liquids which, when combined, create a gas to distend the wall of the oesophagus) or single contrast (non-ionic iodine based). The study is usually tailored to your symptoms so a good history is essential. Attention is paid during the study to the form, structure and configuration of the esophagus, looking for functional disorders (such as aspiration, dysphagia, achalasia, motility and reflux) EXAMINATION You may be asked to change into a gown, depending on the type of swallow being performed. A radiologist and radiographer will perform the procedure. The radiologist will advise you of the type of contrast selected for your procedure and direct you during the exam. You will be asked to stand, sit or lie in several different positions and to hold a small amount of fluid in your mouth before being asked to swallow while the imaging is performed .In some instances you may be asked to swallow barium coated marshmallows to assess the motility of a solid food bolus. The exam can be recorded as a digital or video fluoroscopy procedure. POST PROCEDURE It will take 1-2 days for the barium  to pass through your system. To facilitate this, it is important, unless otherwise directed, to increase your fluids for the next 24-48hrs and to resume your normal diet.  This test typically takes about 30 minutes to perform. _______________________________________________________  If you are age 58 or older, your body mass index should be between 23-30. Your Body mass index is 32.74 kg/m. If this is out of the aforementioned range listed, please consider follow up with your Primary Care Provider.  If you are age 58 or younger, your body mass index should be between 19-25. Your Body mass index is 32.74 kg/m. If this is out of the aformentioned range listed, please consider follow up with your Primary Care Provider.

## 2018-03-20 MED FILL — !LINZESS 145 MCG CAPSULE: 145 | 30 days supply | Qty: 30 | Fill #0

## 2018-03-22 ENCOUNTER — Ambulatory Visit (HOSPITAL_COMMUNITY): Payer: Medicaid Other

## 2018-03-23 ENCOUNTER — Ambulatory Visit (HOSPITAL_BASED_OUTPATIENT_CLINIC_OR_DEPARTMENT_OTHER): Payer: Medicaid Other | Attending: Family Medicine | Admitting: Internal Medicine

## 2018-03-23 VITALS — Ht 62.0 in | Wt 172.0 lb

## 2018-03-23 DIAGNOSIS — G4733 Obstructive sleep apnea (adult) (pediatric): Secondary | ICD-10-CM

## 2018-03-23 DIAGNOSIS — G479 Sleep disorder, unspecified: Secondary | ICD-10-CM | POA: Diagnosis present

## 2018-03-26 ENCOUNTER — Encounter: Payer: Self-pay | Admitting: Physical Medicine & Rehabilitation

## 2018-03-26 ENCOUNTER — Other Ambulatory Visit: Payer: Self-pay

## 2018-03-26 ENCOUNTER — Ambulatory Visit (HOSPITAL_BASED_OUTPATIENT_CLINIC_OR_DEPARTMENT_OTHER): Payer: Self-pay | Admitting: Physical Medicine & Rehabilitation

## 2018-03-26 VITALS — BP 120/82 | HR 72 | Ht 62.0 in | Wt 180.2 lb

## 2018-03-26 DIAGNOSIS — G8929 Other chronic pain: Secondary | ICD-10-CM

## 2018-03-26 DIAGNOSIS — M533 Sacrococcygeal disorders, not elsewhere classified: Secondary | ICD-10-CM

## 2018-03-26 NOTE — Patient Instructions (Signed)
Sacroiliac Joint Dysfunction    Sacroiliac joint dysfunction is a condition that causes inflammation on one or both sides of the sacroiliac (SI) joint. The SI joint connects the lower part of the spine (sacrum) with the two upper portions of the pelvis (ilium). This condition causes deep aching or burning pain in the low back. In some cases, the pain may also spread into one or both buttocks, hips, or thighs.  What are the causes?  This condition may be caused by:   Pregnancy. During pregnancy, extra stress is put on the SI joints because the pelvis widens.   Injury, such as:  ? Injuries from car accidents.  ? Sports-related injuries.  ? Work-related injuries.   Having one leg that is shorter than the other.   Conditions that affect the joints, such as:  ? Rheumatoid arthritis.  ? Gout.  ? Psoriatic arthritis.  ? Joint infection (septic arthritis).  Sometimes, the cause of SI joint dysfunction is not known.  What are the signs or symptoms?  Symptoms of this condition include:   Aching or burning pain in the lower back. The pain may also spread to other areas, such as:  ? Buttocks.  ? Groin.  ? Thighs.   Muscle spasms in or around the painful areas.   Increased pain when standing, walking, running, stair climbing, bending, or lifting.  How is this diagnosed?  This condition is diagnosed with a physical exam and medical history. During the exam, the health care provider may move one or both of your legs to different positions to check for pain. Various tests may be done to confirm the diagnosis, including:   Imaging tests to look for other causes of pain. These may include:  ? MRI.  ? CT scan.  ? Bone scan.   Diagnostic injection. A numbing medicine is injected into the SI joint using a needle. If your pain is temporarily improved or stopped after the injection, this can indicate that SI joint dysfunction is the problem.  How is this treated?  Treatment depends on the cause and severity of your condition.  Treatment options may include:   Ice or heat applied to the lower back area after an injury. This may help reduce pain and muscle spasms.   Medicines to relieve pain or inflammation or to relax the muscles.   Wearing a back brace (sacroiliac brace) to help support the joint while your back is healing.   Physical therapy to increase muscle strength around the joint and flexibility at the joint. This may also involve learning proper body positions and ways of moving to relieve stress on the joint.   Direct manipulation of the SI joint.   Injections of steroid medicine into the joint to reduce pain and swelling.   Radiofrequency ablation to burn away nerves that are carrying pain messages from the joint.   Use of a device that provides electrical stimulation to help reduce pain at the joint.   Surgery to put in screws and plates that limit or prevent joint motion. This is rare.  Follow these instructions at home:  Medicines   Take over-the-counter and prescription medicines only as told by your health care provider.   Do not drive or use heavy machinery while taking prescription pain medicine.   If you are taking prescription pain medicine, take actions to prevent or treat constipation. Your health care provider may recommend that you:  ? Drink enough fluid to keep your urine pale yellow.  ?   Eat foods that are high in fiber, such as fresh fruits and vegetables, whole grains, and beans.  ? Limit foods that are high in fat and processed sugars, such as fried or sweet foods.  ? Take an over-the-counter or prescription medicine for constipation.  If you have a brace:   Wear the brace as told by your health care provider. Remove it only as told by your health care provider.   Keep the brace clean.   If the brace is not waterproof:  ? Do not let it get wet.  ? Cover it with a watertight covering when you take a bath or a shower.  Managing pain, stiffness, and swelling          Icing can help with pain and  swelling. Heat may help with muscle tension or spasms. Ask your health care provider if you should use ice or heat.   If directed, put ice on the affected area:  ? If you have a removable brace, remove it as told by your health care provider.  ? Put ice in a plastic bag.  ? Place a towel between your skin and the bag.  ? Leave the ice on for 20 minutes, 2-3 times a day.   If directed, apply heat to the affected area. Use the heat source that your health care provider recommends, such as a moist heat pack or a heating pad.  ? Place a towel between your skin and the heat source.  ? Leave the heat on for 20-30 minutes.  ? Remove the heat if your skin turns bright red. This is especially important if you are unable to feel pain, heat, or cold. You may have a greater risk of getting burned.  General instructions   Rest as needed. Ask your health care provider what activities are safe for you.   Return to your normal activities as told by your health care provider.   Exercise as directed by your health care provider or physical therapist.   Do not use any products that contain nicotine or tobacco, such as cigarettes and e-cigarettes. These can delay bone healing. If you need help quitting, ask your health care provider.   Keep all follow-up visits as told by your health care provider. This is important.  Contact a health care provider if:   Your pain is not controlled with medicine.   You have a fever.   Your pain is getting worse.  Get help right away if:   You have weakness, numbness, or tingling in your legs or feet.   You lose control of your bladder or bowel.  Summary   Sacroiliac joint dysfunction is a condition that causes inflammation on one or both sides of the sacroiliac (SI) joint.   This condition causes deep aching or burning pain in the low back. In some cases, the pain may also spread into one or both buttocks, hips, or thighs.   Treatment depends on the cause and severity of your condition.  It may include medicines to reduce pain and swelling or to relax muscles.  This information is not intended to replace advice given to you by your health care provider. Make sure you discuss any questions you have with your health care provider.  Document Released: 04/22/2008 Document Revised: 03/06/2017 Document Reviewed: 03/06/2017  [employer]vier Interactive Patient Education  2019 [employer]
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
## 2018-03-29 ENCOUNTER — Telehealth: Payer: Self-pay | Admitting: Family Medicine

## 2018-03-29 ENCOUNTER — Other Ambulatory Visit: Payer: Self-pay | Admitting: *Deleted

## 2018-03-29 DIAGNOSIS — E782 Mixed hyperlipidemia: Secondary | ICD-10-CM

## 2018-03-29 DIAGNOSIS — Z951 Presence of aortocoronary bypass graft: Secondary | ICD-10-CM

## 2018-03-29 DIAGNOSIS — I2510 Atherosclerotic heart disease of native coronary artery without angina pectoris: Secondary | ICD-10-CM

## 2018-03-29 LAB — LIPID PANEL
Chol/HDL Ratio: 3.6 ratio (ref 0.0–4.4)
Cholesterol, Total: 189 mg/dL (ref 100–199)
HDL: 52 mg/dL (ref >39)
LDL Calculated: 93 mg/dL (ref 0–99)
Triglycerides: 221 mg/dL — ABNORMAL HIGH (ref 0–149)
VLDL CHOLESTEROL CAL: 44 mg/dL — AB (ref 5–40)

## 2018-03-29 LAB — BASIC METABOLIC PANEL
BUN / CREAT RATIO: 21 (ref 9–23)
BUN: 17 mg/dL (ref 6–24)
CO2: 21 mmol/L (ref 20–29)
Calcium: 9.7 mg/dL (ref 8.7–10.2)
Chloride: 103 mmol/L (ref 96–106)
Creatinine, Ser: 0.82 mg/dL (ref 0.57–1.00)
GFR calc Af Amer: 91 mL/min/{1.73_m2} (ref >59)
GFR calc non Af Amer: 79 mL/min/{1.73_m2} (ref >59)
Glucose: 116 mg/dL — ABNORMAL HIGH (ref 65–99)
Potassium: 4.3 mmol/L (ref 3.5–5.2)
SODIUM: 142 mmol/L (ref 134–144)

## 2018-03-29 LAB — HEPATIC FUNCTION PANEL
ALBUMIN: 4.4 g/dL (ref 3.8–4.9)
ALT: 16 IU/L (ref 0–32)
AST: 14 IU/L (ref 0–40)
Alkaline Phosphatase: 136 IU/L — ABNORMAL HIGH (ref 39–117)
BILIRUBIN TOTAL: 0.2 mg/dL (ref 0.0–1.2)
Bilirubin, Direct: 0.08 mg/dL (ref 0.00–0.40)
TOTAL PROTEIN: 7.5 g/dL (ref 6.0–8.5)

## 2018-03-29 NOTE — Telephone Encounter (Signed)
Patient called in regards to paperwork that was mail to PCP on 02/05th. Patient states landlord has not received paperwork back and wanted to check on the status of the paperwork and if it was received. Please follow up.

## 2018-03-30 NOTE — Telephone Encounter (Signed)
Contacted patient and notified of message.

## 2018-03-30 NOTE — Telephone Encounter (Signed)
Paperwork completed and given to CMA to return by fax and contact patient to see if she wishes to pick up the originals

## 2018-03-30 NOTE — Telephone Encounter (Signed)
Please inform patient of paperwork being completed and faxed. She may pick up the hard copy and a copy of the transmission log, it is placed at the front desk.

## 2018-03-31 DIAGNOSIS — G479 Sleep disorder, unspecified: Secondary | ICD-10-CM

## 2018-03-31 NOTE — Procedures (Signed)
Patient Name: Lindsay Rivera, Supak Date: 03/23/2018 Gender: Female D.O.B: February 15, 1960 Age (years): 78 Referring Provider: Cammie Fulp Height (inches): 62 Interpreting Physician: Baird Lyons MD, ABSM Weight (lbs): 172 RPSGT: Baxter Flattery BMI: 31 MRN: [id number] Neck Size: 15.50  CLINICAL INFORMATION Sleep Study Type: Split Night CPAP Indication for sleep study: OSA, Snoring, Witnessed Apneas Epworth Sleepiness Score: 2  SLEEP STUDY TECHNIQUE As per the AASM Manual for the Scoring of Sleep and Associated Events v2.3 (April 2016) with a hypopnea requiring 4% desaturations.  The channels recorded and monitored were frontal, central and occipital EEG, electrooculogram (EOG), submentalis EMG (chin), nasal and oral airflow, thoracic and abdominal wall motion, anterior tibialis EMG, snore microphone, electrocardiogram, and pulse oximetry. Continuous positive airway pressure (CPAP) was initiated when the patient met split night criteria and was titrated according to treat sleep-disordered breathing.  MEDICATIONS Medications self-administered by patient taken the night of the study : SEROQUEL  RESPIRATORY PARAMETERS Diagnostic  Total AHI (/hr): 72.4 RDI (/hr): 72.4 OA Index (/hr): 39 CA Index (/hr): 0.0 REM AHI (/hr): 96.5 NREM AHI (/hr): 62.9 Supine AHI (/hr): 72.4 Non-supine AHI (/hr): 0 Min O2 Sat (%): 74.0 Mean O2 (%): 92.6 Time below 88% (min): 18.6   Titration  Optimal Pressure (cm): 13 AHI at Optimal Pressure (/hr): 0.0 Min O2 at Optimal Pressure (%): 95.0 Supine % at Optimal (%): 100 Sleep % at Optimal (%): 91   SLEEP ARCHITECTURE The recording time for the entire night was 398.4 minutes.  During a baseline period of 183.1 minutes, the patient slept for 179.8 minutes in REM and nonREM, yielding a sleep efficiency of 98.2%%. Sleep onset after lights out was 0.3 minutes with a REM latency of 102.5 minutes. The patient spent 2.5%% of the night in stage N1 sleep, 69.1%% in  stage N2 sleep, 0.0%% in stage N3 and 28.4% in REM.  During the titration period of 210.6 minutes, the patient slept for 194.7 minutes in REM and nonREM, yielding a sleep efficiency of 92.4%%. Sleep onset after CPAP initiation was 6.4 minutes with a REM latency of 68.0 minutes. The patient spent 4.1%% of the night in stage N1 sleep, 74.8%% in stage N2 sleep, 0.0%% in stage N3 and 21.1% in REM.  CARDIAC DATA The 2 lead EKG demonstrated sinus rhythm. The mean heart rate was 100.0 beats per minute. Other EKG findings include: None. LEG MOVEMENT DATA The total Periodic Limb Movements of Sleep (PLMS) were 0. The PLMS index was 0.0 .  IMPRESSIONS - Severe obstructive sleep apnea occurred during the diagnostic portion of the study (AHI = 72.4/hour). An optimal PAP pressure was selected for this patient ( 13 cm of water) - No significant central sleep apnea occurred during the diagnostic portion of the study (CAI = 0.0/hour). - Oxygen desaturation was noted during the diagnostic portion of the study (Min O2 = 74.0%). Mean sat 92.6%. - No snoring was audible during the diagnostic portion of the study. - No cardiac abnormalities were noted during this study. - Clinically significant periodic limb movements did not occur during sleep.  DIAGNOSIS - Obstructive Sleep Apnea (327.23 [G47.33 ICD-10])  RECOMMENDATIONS - Trial of CPAP therapy on 13 cm H2O or autopap 10-20. - Patient used a Small size Resmed Full Face Mask AirFit F20 mask and heated humidification. - Be careful with alcohol, sedatives and other CNS depressants that may worsen sleep apnea and disrupt normal sleep architecture. - Sleep hygiene should be reviewed to assess factors that may improve sleep quality. -  Weight management and regular exercise should be initiated or continued.  [Electronically signed] 03/31/2018 12:26 PM  Baird Lyons MD, [place], American Board of Sleep Medicine   NPI: [id number]                          [place], [place] [facility]  ELECTRONICALLY SIGNED ON:  03/31/2018, 12:16 PM [place] PH: [phone]   FX: [phone] [place]

## 2018-04-02 ENCOUNTER — Other Ambulatory Visit: Payer: Self-pay | Admitting: Family Medicine

## 2018-04-02 ENCOUNTER — Other Ambulatory Visit: Payer: Self-pay | Admitting: Cardiovascular Disease

## 2018-04-02 DIAGNOSIS — T148XXA Other injury of unspecified body region, initial encounter: Secondary | ICD-10-CM

## 2018-04-02 MED FILL — ROSUVASTATIN CALCIUM 20 MG: 20 | 30 days supply | Qty: 30 | Fill #3

## 2018-04-02 MED FILL — POTASSIUM CHLORIDE ER 10 ME: 10 | 30 days supply | Qty: 60 | Fill #0

## 2018-04-02 MED FILL — ?LEVOTHYROXINE 125 MCG TABL: 125 | 30 days supply | Qty: 30 | Fill #5

## 2018-04-02 MED FILL — CLOPIDOGREL 75 MG TABLET: 75 | 30 days supply | Qty: 30 | Fill #5

## 2018-04-02 MED FILL — AMLODIPINE BESYLATE 5 MG TA: 5 | 30 days supply | Qty: 30 | Fill #2

## 2018-04-02 MED FILL — ?CARVEDILOL 3.125 MG TABLET: 3.125 | 30 days supply | Qty: 60 | Fill #6

## 2018-04-02 MED FILL — ?METFORMIN HCL ER 500MG TAB: 500 | 30 days supply | Qty: 30 | Fill #5

## 2018-04-02 MED FILL — VALACYCLOVIR HCL 500 MG TAB: 500 | 3 days supply | Qty: 6 | Fill #5

## 2018-04-02 MED FILL — ?PANTOPRAZOLE SOD DR 40MG T: 40 | 30 days supply | Qty: 30 | Fill #5

## 2018-04-03 ENCOUNTER — Telehealth: Payer: Self-pay

## 2018-04-03 NOTE — Telephone Encounter (Signed)
Call placed to the patient and informed her of her sleep study results and the need for a CPAP machine.  She stated that she has no money for a new  CPAP machine.   Explained to her that the American Sleep Apnea Association (ASAA) has a CPAP assistance program that provides refurbished CPAP machines for a $100 donation.  She said that she is not able to afford $100 or $50.  She is not working and has applied for disability.   She has no income and relies on her sister for assistance. 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  This CM then explained that  [facility] would pay the $100 donation to the [facility] for her. There clinic will pay for 1 machine/person and there is no guarantee that comes with the machine. She stated she understood.    She will need to come to the clinic to sign a waiver for the ASAA before the order for the CPAP can be processed. Informed her that the waiver will be left at the front desk and she can come to sign it when she is able.  She said that she would be at the clinic this afternoon.

## 2018-04-04 ENCOUNTER — Ambulatory Visit: Payer: Medicaid Other | Admitting: Licensed Clinical Social Worker

## 2018-04-04 ENCOUNTER — Telehealth: Payer: Self-pay

## 2018-04-04 NOTE — Telephone Encounter (Signed)
Completed application for CPAP faxed to [employer],

## 2018-04-04 NOTE — Telephone Encounter (Signed)
Call received from [place], [employer]. [facility] paid the $100 donation for the CPAP machine.

## 2018-04-09 ENCOUNTER — Ambulatory Visit: Payer: Medicaid Other | Admitting: Physical Medicine & Rehabilitation

## 2018-04-11 ENCOUNTER — Ambulatory Visit: Payer: Medicaid Other | Admitting: Licensed Clinical Social Worker

## 2018-04-12 ENCOUNTER — Telehealth: Payer: Self-pay

## 2018-04-12 NOTE — Telephone Encounter (Signed)
Call placed to patient and informed her that CPAP teaching has been scheduled for 04/23/2018 @ 1130 @ [place]

## 2018-04-17 ENCOUNTER — Telehealth: Payer: Self-pay | Admitting: *Deleted

## 2018-04-17 MED ORDER — DIAZEPAM 10 MG PO TABS: ORAL_TABLET | ORAL | 0 refills | Status: DC

## 2018-04-17 NOTE — Telephone Encounter (Signed)
Patient left a message stating she has a  procedure scheduled Monday and is asking for pre-procedure medication.  Contacted pharmacy and ordered diazepam 10 mg per office protocol.  I stipulated to not fill before Friday the 13th of March.

## 2018-04-23 ENCOUNTER — Other Ambulatory Visit: Payer: Self-pay

## 2018-04-23 ENCOUNTER — Encounter: Payer: Medicaid Other | Attending: Physical Medicine & Rehabilitation

## 2018-04-23 ENCOUNTER — Encounter: Payer: Self-pay | Admitting: Physical Medicine & Rehabilitation

## 2018-04-23 ENCOUNTER — Telehealth: Payer: Self-pay

## 2018-04-23 ENCOUNTER — Ambulatory Visit (HOSPITAL_BASED_OUTPATIENT_CLINIC_OR_DEPARTMENT_OTHER): Payer: No Typology Code available for payment source | Admitting: Physical Medicine & Rehabilitation

## 2018-04-23 VITALS — BP 117/69 | HR 78 | Resp 14 | Ht 62.0 in | Wt 177.0 lb

## 2018-04-23 DIAGNOSIS — M533 Sacrococcygeal disorders, not elsewhere classified: Secondary | ICD-10-CM | POA: Insufficient documentation

## 2018-04-23 DIAGNOSIS — G8929 Other chronic pain: Secondary | ICD-10-CM

## 2018-04-23 DIAGNOSIS — R269 Unspecified abnormalities of gait and mobility: Secondary | ICD-10-CM | POA: Insufficient documentation

## 2018-04-23 NOTE — Progress Notes (Signed)
  PROCEDURE RECORD [facility]   Name: IDALEE FOXWORTHY DOB:1960/09/03 MRN: [id number]  Date:04/23/2018  Physician: Alysia Penna, MD    Nurse/CMA: Bright, CMA  Allergies:  Allergies  Allergen Reactions  . Penicillins Swelling    Has patient had a PCN reaction causing immediate rash, facial/tongue/throat swelling, SOB or lightheadedness with hypotension: No Has patient had a PCN reaction causing severe rash involving mucus membranes or skin necrosis: No Has patient had a PCN reaction that required hospitalization No Has patient had a PCN reaction occurring within the last 10 years: No If all of the above answers are "NO", then may proceed with Cephalosporin use.   . Latex Rash    Consent Signed: Yes.    Is patient diabetic? Yes.    CBG today? ?  Pregnant: No. LMP: No LMP recorded. Patient has had a hysterectomy. (age 30-55)  Anticoagulants: yes (aspirin 81) Anti-inflammatory: no Antibiotics: no  Procedure: right L5, S1-3 lateral branch block  Position: Prone   Start Time: 300pm End Time: 305pm Fluoro Time: 38s  RN/CMA Josetta Wigal, CMA Bright, CMA    Time 2:20pm 3:13pm    BP 117/69 138/82    Pulse 78 72    Respirations 14 14    O2 Sat 93 96    S/S 6 6    Pain Level 8/10 0/10     D/C home with Son patient A & O X 3, D/C instructions reviewed, and sits independently.

## 2018-04-23 NOTE — Patient Instructions (Signed)
Sacroiliac injection was performed today. A combination of a naming medicine plus a cortisone medicine was injected. The injection was done under x-ray guidance. This procedure has been performed to help reduce low back and buttocks pain as well as potentially hip pain. The duration of this injection is variable lasting from hours to  Months. It may repeated if needed. 

## 2018-04-23 NOTE — Progress Notes (Signed)
L5 dorsal ramus S1-S2-S3 lateral branch blocks under fluoroscopic guidance right side  Informed consent was obtained after describing risks and benefits of the procedure with patient these include bleeding bruising and infection.  He elects to proceed and has given written consent.  Patient placed prone on fluoroscopy table Betadine prep sterile drape a 25-gauge 1.5 inch needle was used to anesthetize skin and subcu tissue with 1% lidocaine 1 cc into each of 4 sites.  Then a 22-gauge 3.5" needle was inserted under fluoroscopic guidance for starting the S1 SAP sacral ala junction.  Bone contact made.  Isovue 200 x 0.5 mL demonstrated no intravascular uptake then 0.5 mL of 2% lidocaine was injected.  Then the lateral aspect of the S1, S2, S3 foramen was targeted.  Bone contact made out, Isovue-200 times 0.5 mL demonstrated no nerve root or intravascular uptake within 0.5 mL of 2% lidocaine solution was injected after negative drawback for blood.  Patient tolerated procedure well.  Postinjection instructions given.  Pre injection pain  8/10 Post injection pain 0/10

## 2018-04-23 NOTE — Telephone Encounter (Signed)
The patient, her son and his fiance met with Pamala Hurry, [facility] and were instructed regarding the use and care of the CPAP machine.  The patient was given her CPAP machine after the teaching was completed.

## 2018-04-30 ENCOUNTER — Ambulatory Visit: Payer: Medicaid Other

## 2018-05-04 ENCOUNTER — Other Ambulatory Visit: Payer: Self-pay | Admitting: Family Medicine

## 2018-05-04 DIAGNOSIS — E089 Diabetes mellitus due to underlying condition without complications: Secondary | ICD-10-CM

## 2018-05-04 MED FILL — ?AMLODIPINE BESYLATE 5 MG T: 5 MG | 30 days supply | Qty: 30 | Fill #3

## 2018-05-04 MED FILL — FLUoxetine HCL 10 MG CAPS: 10 | 30 days supply | Qty: 30 | Fill #0

## 2018-05-04 MED FILL — ?METFORMIN HCL ER 500 MG TA: 500 | 30 days supply | Qty: 30 | Fill #0

## 2018-05-04 MED FILL — QUETIAPINE FUMARATE 300 MG: 300 | 30 days supply | Qty: 30 | Fill #0

## 2018-05-04 MED FILL — POTASSIUM CHLORIDE ER 10 ME: 10 | 30 days supply | Qty: 60 | Fill #1

## 2018-05-04 MED FILL — PANTOPRAZOLE SOD DR 40 MG T: 40 | 30 days supply | Qty: 30 | Fill #6

## 2018-05-04 MED FILL — ROSUVASTATIN CALCIUM 20 MG: 20 | 30 days supply | Qty: 30 | Fill #4

## 2018-05-04 MED FILL — ?CARVEDILOL 3.125 MG TABLET: 3.125 | 30 days supply | Qty: 60 | Fill #7

## 2018-05-04 MED FILL — CLOPIDOGREL 75 MG TABLET: 75 | 30 days supply | Qty: 30 | Fill #6

## 2018-05-04 MED FILL — ?LEVOTHYROXINE 125 MCG TABL: 125 | 30 days supply | Qty: 30 | Fill #6

## 2018-05-10 ENCOUNTER — Telehealth: Payer: Self-pay | Admitting: Family Medicine

## 2018-05-10 ENCOUNTER — Encounter: Payer: Self-pay | Admitting: Family Medicine

## 2018-05-10 MED FILL — VALACYCLOVIR HCL 500 MG TAB: 500 | 3 days supply | Qty: 6 | Fill #6

## 2018-05-10 MED FILL — !LINZESS 145 MCG CAPSULE: 145 | 30 days supply | Qty: 30 | Fill #1

## 2018-05-10 NOTE — Telephone Encounter (Signed)
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

## 2018-05-10 NOTE — Telephone Encounter (Signed)
I will write a letter for the patient hopefully by the end of the workday and she can be advised to stay out/call out of work today if she is feeling ill-fever/cough and if patient she has had contact with may have COVID-19 then Lindsay Rivera needs to do a 14 day home quarantine

## 2018-05-10 NOTE — Telephone Encounter (Signed)
Please advise on this request

## 2018-05-10 NOTE — Telephone Encounter (Signed)
Letter has been typed and signed. Please notify patient

## 2018-05-10 NOTE — Telephone Encounter (Signed)
Letter was faxed to [facility] [employer] per patients request and a copy was mailed to patients current address.

## 2018-05-10 NOTE — Progress Notes (Signed)
Patient ID: Lindsay Rivera, female   DOB: 10-15-1960, 58 y.o.   MRN: [id number]   Patient contacted office stating that she needed a letter written to her employer regarding the fact that she has health conditions and should not work at this time due to her health conditions. See phone encounter from patient. Letter written for patient pick-up or can be mailed to patient and/or faxed to her employer

## 2018-05-23 MED FILL — FLUoxetine HCL 20 MG CAPS: 20 | 30 days supply | Qty: 30 | Fill #0

## 2018-05-23 MED FILL — QUETIAPINE FUMARATE 300 MG: 300 | 30 days supply | Qty: 30 | Fill #0

## 2018-05-24 ENCOUNTER — Ambulatory Visit (HOSPITAL_BASED_OUTPATIENT_CLINIC_OR_DEPARTMENT_OTHER): Payer: Self-pay | Admitting: Physical Medicine & Rehabilitation

## 2018-05-24 ENCOUNTER — Ambulatory Visit: Payer: Medicaid Other | Attending: Family Medicine | Admitting: Family Medicine

## 2018-05-24 ENCOUNTER — Other Ambulatory Visit: Payer: Self-pay

## 2018-05-24 ENCOUNTER — Encounter: Payer: Self-pay | Admitting: Family Medicine

## 2018-05-24 ENCOUNTER — Encounter: Payer: Self-pay | Attending: Physical Medicine & Rehabilitation

## 2018-05-24 ENCOUNTER — Encounter: Payer: Self-pay | Admitting: Physical Medicine & Rehabilitation

## 2018-05-24 VITALS — BP 107/66 | HR 74 | Ht 62.0 in | Wt 176.0 lb

## 2018-05-24 VITALS — BP 142/83 | HR 68 | Temp 98.7°F | Ht 62.0 in | Wt 174.0 lb

## 2018-05-24 DIAGNOSIS — E039 Hypothyroidism, unspecified: Secondary | ICD-10-CM

## 2018-05-24 DIAGNOSIS — M533 Sacrococcygeal disorders, not elsewhere classified: Secondary | ICD-10-CM | POA: Insufficient documentation

## 2018-05-24 DIAGNOSIS — R911 Solitary pulmonary nodule: Secondary | ICD-10-CM

## 2018-05-24 DIAGNOSIS — G8929 Other chronic pain: Secondary | ICD-10-CM | POA: Insufficient documentation

## 2018-05-24 DIAGNOSIS — J069 Acute upper respiratory infection, unspecified: Secondary | ICD-10-CM

## 2018-05-24 DIAGNOSIS — J309 Allergic rhinitis, unspecified: Secondary | ICD-10-CM

## 2018-05-24 DIAGNOSIS — R0600 Dyspnea, unspecified: Secondary | ICD-10-CM

## 2018-05-24 DIAGNOSIS — J439 Emphysema, unspecified: Secondary | ICD-10-CM

## 2018-05-24 DIAGNOSIS — IMO0001 Reserved for inherently not codable concepts without codable children: Secondary | ICD-10-CM

## 2018-05-24 MED ORDER — DIAZEPAM 10 MG PO TABS: ORAL_TABLET | ORAL | 0 refills | Status: DC

## 2018-05-24 MED ORDER — ALBUTEROL SULFATE HFA 108 (90 BASE) MCG/ACT IN AERS: INHALATION_SPRAY | RESPIRATORY_TRACT | 2 refills | Status: DC

## 2018-05-24 MED ORDER — CETIRIZINE HCL 5 MG PO TABS: 5.0000 mg | ORAL_TABLET | Freq: Every day | ORAL | 6 refills | Status: DC

## 2018-05-24 MED FILL — ?CETIRIZINE HCL 10 MG TABLE: 10 | 30 days supply | Qty: 15 | Fill #0

## 2018-05-24 MED FILL — !VENTOLIN HFA INHALER: 108 (90 BAS | 25 days supply | Qty: 18 | Fill #1

## 2018-05-24 NOTE — Progress Notes (Signed)
Per pt she is here due to her lungs hurting
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
## 2018-05-25 ENCOUNTER — Other Ambulatory Visit: Payer: Self-pay | Admitting: Family Medicine

## 2018-05-25 DIAGNOSIS — E039 Hypothyroidism, unspecified: Secondary | ICD-10-CM

## 2018-05-25 DIAGNOSIS — H5713 Ocular pain, bilateral: Secondary | ICD-10-CM

## 2018-05-25 LAB — BASIC METABOLIC PANEL WITH GFR
BUN/Creatinine Ratio: 10 (ref 9–23)
BUN: 7 mg/dL (ref 6–24)
CO2: 25 mmol/L (ref 20–29)
Calcium: 9.7 mg/dL (ref 8.7–10.2)
Chloride: 103 mmol/L (ref 96–106)
Creatinine, Ser: 0.68 mg/dL (ref 0.57–1.00)
GFR calc Af Amer: 112 mL/min/1.73
GFR calc non Af Amer: 97 mL/min/1.73
Glucose: 91 mg/dL (ref 65–99)
Potassium: 4.1 mmol/L (ref 3.5–5.2)
Sodium: 142 mmol/L (ref 134–144)

## 2018-05-25 LAB — T4 AND TSH
T4, Total: 10.8 ug/dL (ref 4.5–12.0)
TSH: 0.151 u[IU]/mL — ABNORMAL LOW (ref 0.450–4.500)

## 2018-05-25 MED ORDER — LEVOTHYROXINE SODIUM 112 MCG PO TABS: 112.0000 ug | ORAL_TABLET | Freq: Every day | ORAL | 0 refills | Status: DC

## 2018-05-25 MED FILL — ?LEVOTHYROXINE 112 MCG TAB: 112 | 30 days supply | Qty: 30 | Fill #0

## 2018-05-25 NOTE — Progress Notes (Signed)
Patient ID: Lindsay Rivera, female   DOB: 1960-08-05, 58 y.o.   MRN: [id number]   Patient was recently seen and had complaint of eye discomfort and was wondering if she can check of her thyroid levels.  Patient had blood work showing mild decrease in TSH but as patient with complaint of eye discomfort, will lower her dose of levothyroxine from 125 mcg down to 112 mcg.  Will also refer patient to eye specialist regarding eye discomfort.  Recheck TSH/T4 in 3 months.  Patient will be notified of her lab results and change in medication.

## 2018-05-25 NOTE — Progress Notes (Signed)
opt

## 2018-05-28 ENCOUNTER — Other Ambulatory Visit: Payer: Self-pay | Admitting: Family Medicine

## 2018-05-28 DIAGNOSIS — H53141 Visual discomfort, right eye: Secondary | ICD-10-CM

## 2018-05-28 DIAGNOSIS — H5713 Ocular pain, bilateral: Secondary | ICD-10-CM

## 2018-05-28 NOTE — Progress Notes (Signed)
Patient ID: Lindsay Rivera, female   DOB: 1961/01/23, 58 y.o.   MRN: [id number]   Patient with complaint of eye discomfort. Will place ophthalmology referral

## 2018-05-30 ENCOUNTER — Telehealth: Payer: Self-pay | Admitting: Physical Medicine & Rehabilitation

## 2018-05-30 MED ORDER — METHOCARBAMOL 500 MG PO TABS: 500.0000 mg | ORAL_TABLET | Freq: Four times a day (QID) | ORAL | 1 refills | Status: DC

## 2018-05-30 NOTE — Telephone Encounter (Signed)
Called ptn to move out ask risk factor too high - she states has been in pain since last injection -- [facility] [address] [facility] - she cannot continue to wait for next injection

## 2018-06-03 ENCOUNTER — Encounter: Payer: Self-pay | Admitting: Family Medicine

## 2018-06-04 ENCOUNTER — Ambulatory Visit: Payer: Medicaid Other | Admitting: Physical Medicine & Rehabilitation

## 2018-06-12 ENCOUNTER — Telehealth: Payer: Self-pay | Admitting: Cardiovascular Disease

## 2018-06-12 NOTE — Telephone Encounter (Signed)
Spoke with patient who confirmed all demographics. Patient has a smart phone but no computer.  I sent her a text link for My Chart. She will have vitals ready for visit.    Virtual Visit Pre-Appointment Phone Call  "(Name), I am calling you today to discuss your upcoming appointment. We are currently trying to limit exposure to the virus that causes COVID-19 by seeing patients at home rather than in the office."  1. "What is the BEST phone number to call the day of the visit?" - include this in appointment notes  2. Do you have or have access to (through a family member/friend) a smartphone with video capability that we can use for your visit?" a. If yes - list this number in appt notes as cell (if different from BEST phone #) and list the appointment type as a VIDEO visit in appointment notes b. If no - list the appointment type as a PHONE visit in appointment notes  3. Confirm consent - "In the setting of the current Covid19 crisis, you are scheduled for a (phone or video) visit with your provider on (date) at (time).  Just as we do with many in-office visits, in order for you to participate in this visit, we must obtain consent.  If you'd like, I can send this to your mychart (if signed up) or email for you to review.  Otherwise, I can obtain your verbal consent now.  All virtual visits are billed to your insurance company just like a normal visit would be.  By agreeing to a virtual visit, we'd like you to understand that the technology does not allow for your provider to perform an examination, and thus may limit your provider's ability to fully assess your condition. If your provider identifies any concerns that need to be evaluated in person, we will make arrangements to do so.  Finally, though the technology is pretty good, we cannot assure that it will always work on either your or our end, and in the setting of a video visit, we may have to convert it to a phone-only visit.  In either  situation, we cannot ensure that we have a secure connection.  Are you willing to proceed?"  The patient said "yes".  4. Advise patient to be prepared - "Two hours prior to your appointment, go ahead and check your blood pressure, pulse, oxygen saturation, and your weight (if you have the equipment to check those) and write them all down. When your visit starts, your provider will ask you for this information. If you have an Apple Watch or Kardia device, please plan to have heart rate information ready on the day of your appointment. Please have a pen and paper handy nearby the day of the visit as well."  5. Give patient instructions for MyChart download to smartphone OR Doximity/Doxy.me as below if video visit (depending on what platform provider is using)  6. Inform patient they will receive a phone call 15 minutes prior to their appointment time (may be from unknown caller ID) so they should be prepared to answer    TELEPHONE CALL NOTE  CHRISTABELLE HANZLIK has been deemed a candidate for a follow-up tele-health visit to limit community exposure during the Covid-19 pandemic. I spoke with the patient via phone to ensure availability of phone/video source, confirm preferred email & phone number, and discuss instructions and expectations.  I reminded KALEISHA BHARGAVA to be prepared with any vital sign and/or heart rhythm information that could  potentially be obtained via home monitoring, at the time of her visit. I reminded ELZADA PYTEL to expect a phone call prior to her visit.  Lindsay Rivera 06/12/2018 4:46 PM   INSTRUCTIONS FOR DOWNLOADING THE MYCHART APP TO SMARTPHONE  - The patient must first make sure to have activated MyChart and know their login information - If Apple, go to [employer] and type in MyChart in the search bar and download the app. If Android, ask patient to go to [employer] and type in [place] in the search bar and download the app. The app is free but as with any  other app downloads, their phone may require them to verify saved payment information or Apple/Android password.  - The patient will need to then log into the app with their MyChart username and password, and select [facility] as their healthcare provider to link the account. When it is time for your visit, go to the MyChart app, find appointments, and click Begin Video Visit. Be sure to Select Allow for your device to access the Microphone and Camera for your visit. You will then be connected, and your provider will be with you shortly.  **If they have any issues connecting, or need assistance please contact MyChart service desk [phone])**  **If using a computer, in order to ensure the best quality for their visit they will need to use either of the following Internet Browsers: [employer], or Google Chrome**  IF USING DOXIMITY or DOXY.ME - The patient will receive a link just prior to their visit by text.     FULL LENGTH CONSENT FOR TELE-HEALTH VISIT   I hereby voluntarily request, consent and authorize [facility] and its employed or contracted physicians, physician assistants, nurse practitioners or other licensed health care professionals (the Practitioner), to provide me with telemedicine health care services (the Services") as deemed necessary by the treating Practitioner. I acknowledge and consent to receive the Services by the Practitioner via telemedicine. I understand that the telemedicine visit will involve communicating with the Practitioner through live audiovisual communication technology and the disclosure of certain medical information by electronic transmission. I acknowledge that I have been given the opportunity to request an in-person assessment or other available alternative prior to the telemedicine visit and am voluntarily participating in the telemedicine visit.  I understand that I have the right to withhold or withdraw my consent to the use of  telemedicine in the course of my care at any time, without affecting my right to future care or treatment, and that the Practitioner or I may terminate the telemedicine visit at any time. I understand that I have the right to inspect all information obtained and/or recorded in the course of the telemedicine visit and may receive copies of available information for a reasonable fee.  I understand that some of the potential risks of receiving the Services via telemedicine include:   Delay or interruption in medical evaluation due to technological equipment failure or disruption;  Information transmitted may not be sufficient (e.g. poor resolution of images) to allow for appropriate medical decision making by the Practitioner; and/or   In rare instances, security protocols could fail, causing a breach of personal health information.  Furthermore, I acknowledge that it is my responsibility to provide information about my medical history, conditions and care that is complete and accurate to the best of my ability. I acknowledge that Practitioner's advice, recommendations, and/or decision may be based on factors not within their  control, such as incomplete or inaccurate data provided by me or distortions of diagnostic images or specimens that may result from electronic transmissions. I understand that the practice of medicine is not an exact science and that Practitioner makes no warranties or guarantees regarding treatment outcomes. I acknowledge that I will receive a copy of this consent concurrently upon execution via email to the email address I last provided but may also request a printed copy by calling the office of [facility].    I understand that my insurance will be billed for this visit.   I have read or had this consent read to me.  I understand the contents of this consent, which adequately explains the benefits and risks of the Services being provided via telemedicine.   I have been  provided ample opportunity to ask questions regarding this consent and the Services and have had my questions answered to my satisfaction.  I give my informed consent for the services to be provided through the use of telemedicine in my medical care  By participating in this telemedicine visit I agree to the above.

## 2018-06-14 ENCOUNTER — Other Ambulatory Visit: Payer: Self-pay

## 2018-06-14 ENCOUNTER — Encounter: Payer: Self-pay | Admitting: Family Medicine

## 2018-06-14 ENCOUNTER — Ambulatory Visit: Payer: Self-pay | Attending: Family Medicine | Admitting: Family Medicine

## 2018-06-14 ENCOUNTER — Telehealth: Payer: Self-pay | Admitting: *Deleted

## 2018-06-14 DIAGNOSIS — Y92009 Unspecified place in unspecified non-institutional (private) residence as the place of occurrence of the external cause: Secondary | ICD-10-CM

## 2018-06-14 DIAGNOSIS — W19XXXA Unspecified fall, initial encounter: Secondary | ICD-10-CM

## 2018-06-14 DIAGNOSIS — IMO0001 Reserved for inherently not codable concepts without codable children: Secondary | ICD-10-CM

## 2018-06-14 DIAGNOSIS — R0609 Other forms of dyspnea: Secondary | ICD-10-CM

## 2018-06-14 DIAGNOSIS — M25561 Pain in right knee: Secondary | ICD-10-CM

## 2018-06-14 DIAGNOSIS — R911 Solitary pulmonary nodule: Secondary | ICD-10-CM
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
## 2018-06-14 NOTE — Telephone Encounter (Signed)
Called patient and informed her that her letter she requested is ready for pick up. Patient verbalized understanding.

## 2018-06-14 NOTE — Progress Notes (Signed)
Right Foot and leg and knee pain due to fall. Per pt she fell and hit her foot and the pain went up her leg. Per pt it was 2wks ago. Per pt she talked to her doctor and they told her to call her PCP. Per pt she needs a letter from her PCP stating she can not live by herself anymore and it's ok for her son to live with her. Per pt she needs this for landlord. Per pt her son name is Lindsay Rivera. Patient also stated that her heart doctor told her that she may need MRI of the leg. Per pt when she was there the last time, she was suppose to have a ref to see the lung doctor but never got a call to go.   BP was 120/80 and pulse was 79 at home today.

## 2018-06-18 ENCOUNTER — Telehealth: Payer: Self-pay | Admitting: Orthopedic Surgery

## 2018-06-18 ENCOUNTER — Telehealth: Payer: Self-pay | Admitting: *Deleted

## 2018-06-18 ENCOUNTER — Ambulatory Visit (INDEPENDENT_AMBULATORY_CARE_PROVIDER_SITE_OTHER): Payer: Self-pay

## 2018-06-18 ENCOUNTER — Encounter: Payer: Self-pay | Admitting: Orthopedic Surgery

## 2018-06-18 ENCOUNTER — Ambulatory Visit (INDEPENDENT_AMBULATORY_CARE_PROVIDER_SITE_OTHER): Payer: Medicaid Other | Admitting: Orthopedic Surgery

## 2018-06-18 ENCOUNTER — Other Ambulatory Visit: Payer: Self-pay

## 2018-06-18 DIAGNOSIS — M25561 Pain in right knee: Secondary | ICD-10-CM

## 2018-06-18 DIAGNOSIS — M79604 Pain in right leg: Secondary | ICD-10-CM

## 2018-06-18 DIAGNOSIS — M1711 Unilateral primary osteoarthritis, right knee: Secondary | ICD-10-CM

## 2018-06-18 DIAGNOSIS — M79671 Pain in right foot: Secondary | ICD-10-CM

## 2018-06-18 MED ORDER — BUPIVACAINE HCL 0.25 % IJ SOLN
4.0000 mL | INTRAMUSCULAR | Status: AC | PRN
  Administered 2018-06-18: 4 mL via INTRA_ARTICULAR

## 2018-06-18 MED ORDER — METHYLPREDNISOLONE ACETATE 40 MG/ML IJ SUSP
40.0000 mg | INTRAMUSCULAR | Status: AC | PRN
  Administered 2018-06-18: 40 mg via INTRA_ARTICULAR

## 2018-06-18 MED ORDER — LIDOCAINE HCL 1 % IJ SOLN
5.0000 mL | INTRAMUSCULAR | Status: AC | PRN
  Administered 2018-06-18: 10:00:00 5 mL

## 2018-06-18 NOTE — Telephone Encounter (Signed)
As this is a preventative medication I will let Dr. Chapman Fitch discuss this with the patient on her return. There are no notes regarding Dr. Chapman Fitch stating it could be refilled or any history listed for HSV

## 2018-06-18 NOTE — Telephone Encounter (Signed)
Please advise 
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
## 2018-06-18 NOTE — Telephone Encounter (Signed)
Would you send a refill for the this patient Valtrex. She states she was told the next time she has an outbreak to come up here per Dr. Chapman Fitch.    Please send to [facility] pharmacy

## 2018-06-18 NOTE — Telephone Encounter (Signed)
Pt called in said she was here this morning and got an injection in her right knee and it happens to be extremely worse now she cant put any weight on her knee. She wants to go ahead and proceed with getting the mri done that you had mentioned to her.  [phone]

## 2018-06-19 ENCOUNTER — Other Ambulatory Visit: Payer: Self-pay | Admitting: Family Medicine

## 2018-06-19 DIAGNOSIS — R21 Rash and other nonspecific skin eruption: Secondary | ICD-10-CM

## 2018-06-19 MED ORDER — VALACYCLOVIR HCL 500 MG PO TABS: 500.0000 mg | ORAL_TABLET | Freq: Three times a day (TID) | ORAL | 0 refills | Status: DC

## 2018-06-19 NOTE — Telephone Encounter (Signed)
Patient was asked to make an appointment so that her rash could be seen the next time she had an outbreak. I am not sure that I have ever seen the rash. I will fill the Valtrex but make patient an appointment for derm follow-up

## 2018-06-19 NOTE — Telephone Encounter (Signed)
Put in order for scan. Patient advised.

## 2018-06-19 NOTE — Telephone Encounter (Signed)
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

## 2018-06-19 NOTE — Progress Notes (Signed)
Patient ID: Lindsay Rivera, female   DOB: [birth year], 58 y.o.   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 Patient was asked to return during acute flare-up in rash but unfortunately I was not in office. RX will be sent in for Valtrex in case rash is related to HSV and patient will also be referred to dermatology for further evaluation and treatment.

## 2018-06-20 MED FILL — ?VALACYCLOVIR HCL 500MG TAB: 500 | 7 days supply | Qty: 21 | Fill #0

## 2018-06-21 ENCOUNTER — Encounter: Payer: Self-pay | Admitting: Cardiovascular Disease

## 2018-06-21 ENCOUNTER — Other Ambulatory Visit: Payer: Self-pay

## 2018-06-21 ENCOUNTER — Telehealth (INDEPENDENT_AMBULATORY_CARE_PROVIDER_SITE_OTHER): Payer: Self-pay | Admitting: Cardiovascular Disease

## 2018-06-21 VITALS — BP 100/76 | HR 78 | Ht 62.0 in | Wt 169.0 lb

## 2018-06-21 DIAGNOSIS — I2510 Atherosclerotic heart disease of native coronary artery without angina pectoris: Secondary | ICD-10-CM

## 2018-06-21 DIAGNOSIS — I25700 Atherosclerosis of coronary artery bypass graft(s), unspecified, with unstable angina pectoris: Secondary | ICD-10-CM

## 2018-06-21 DIAGNOSIS — I25119 Atherosclerotic heart disease of native coronary artery with unspecified angina pectoris: Secondary | ICD-10-CM

## 2018-06-21 DIAGNOSIS — Z9861 Coronary angioplasty status: Secondary | ICD-10-CM

## 2018-06-21 DIAGNOSIS — I10 Essential (primary) hypertension: Secondary | ICD-10-CM

## 2018-06-21 MED ORDER — CLOPIDOGREL BISULFATE 75 MG PO TABS: 75.0000 mg | ORAL_TABLET | Freq: Every day | ORAL | 3 refills | Status: DC

## 2018-06-21 MED ORDER — CARVEDILOL 3.125 MG PO TABS: 3.1250 mg | ORAL_TABLET | Freq: Two times a day (BID) | ORAL | 3 refills | Status: DC

## 2018-06-21 MED ORDER — AMLODIPINE BESYLATE 5 MG PO TABS: 5.0000 mg | ORAL_TABLET | Freq: Every day | ORAL | 3 refills | Status: DC

## 2018-06-21 MED ORDER — ROSUVASTATIN CALCIUM 20 MG PO TABS: 20.0000 mg | ORAL_TABLET | Freq: Every day | ORAL | 3 refills | Status: DC

## 2018-06-21 MED FILL — ?CARVEDILOL 3.125 MG TABLET: 3.125 | 30 days supply | Qty: 60 | Fill #0

## 2018-06-21 MED FILL — ?AMLODIPINE BESYLATE 5MG TA: 5 | 30 days supply | Qty: 30 | Fill #0

## 2018-06-21 MED FILL — ?ROSUVASTATIN CALCIUM 20MG: 20 | 90 days supply | Qty: 90 | Fill #0

## 2018-06-21 MED FILL — CLOPIDOGREL 75 MG TABLET: 75 | 30 days supply | Qty: 30 | Fill #0

## 2018-06-21 NOTE — Patient Instructions (Signed)
Medication Instructions:  Your physician recommends that you continue on your current medications as directed. Please refer to the Current Medication list given to you today. Refills of your medications have been sent to your pharmacy   Lab work: None Ordered   Testing/Procedures: None Ordered   Follow-Up: At [facility], you and your health needs are our priority.  As part of our continuing mission to provide you with exceptional heart care, we have created designated Provider Care Teams.  These Care Teams include your primary Cardiologist (physician) and Advanced Practice Providers (APPs -  Physician Assistants and Nurse Practitioners) who all work together to provide you with the care you need, when you need it. You will need a follow up appointment in:  6 months.  Please call our office 2 months in advance to schedule this appointment.  You may see Mertie Moores, MD or one of the following Advanced Practice Providers on your designated Care Team: Richardson Dopp, PA-C [place], [place] . Daune Perch, NP
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
## 2018-06-28 MED FILL — ?PANTOPRAZOLE SOD DR 40MGTA: 40 | 30 days supply | Qty: 30 | Fill #7

## 2018-06-28 MED FILL — !LINZESS 145 MCG CAPSULE: 145 | 30 days supply | Qty: 30 | Fill #2

## 2018-06-28 MED FILL — POTASSIUM CHLORIDE ER 10 ME: 10 | 30 days supply | Qty: 60 | Fill #2

## 2018-06-28 MED FILL — METFORMIN HCL ER 500 MG TAB: 500 | 30 days supply | Qty: 30 | Fill #1

## 2018-06-28 MED FILL — ?LEVOTHYROXINE 112 MCG TAB: 112 | 30 days supply | Qty: 30 | Fill #1

## 2018-06-29 ENCOUNTER — Telehealth: Payer: Self-pay | Admitting: *Deleted

## 2018-06-29 ENCOUNTER — Other Ambulatory Visit: Payer: Self-pay

## 2018-06-29 ENCOUNTER — Ambulatory Visit
Admission: RE | Admit: 2018-06-29 | Discharge: 2018-06-29 | Disposition: A | Discharge status: Home / Self Care | Payer: Self-pay | Source: Ambulatory Visit | Attending: Orthopedic Surgery | Admitting: Orthopedic Surgery

## 2018-06-29 DIAGNOSIS — M25561 Pain in right knee: Secondary | ICD-10-CM

## 2018-06-29 NOTE — Telephone Encounter (Signed)
Per provider to send the original completed form to that she completed for patient Apartment to be sent to them and a copy sent to patient.

## 2018-06-29 NOTE — Telephone Encounter (Signed)
Provider completed forms for [employer]. A copy of the completed form was sent to scanned and sent to scan center for it to be scanned in patient's chart. Called patient to ask her were do she want staff to mailed this letter to. Per pt she wants the original completed form to be mailed to her address. Staff verified patient address with her and form was placed in the outgoing mail today.

## 2018-07-03 ENCOUNTER — Encounter: Payer: Self-pay | Admitting: Physical Medicine & Rehabilitation

## 2018-07-03 NOTE — Progress Notes (Signed)
Can you please check her chart to see if she has had vitamin D level recently and if not can you draw that for her.  She will likely need to be nonweightbearing for at least 6 weeks for this to heal and I think her vitamin D is probably low.

## 2018-07-05 ENCOUNTER — Telehealth: Payer: Self-pay

## 2018-07-05 ENCOUNTER — Ambulatory Visit: Payer: Medicaid Other | Admitting: Orthopedic Surgery

## 2018-07-05 NOTE — Telephone Encounter (Signed)
Patient states she has to have something for pain since she can't be seen today, she is in "terrible pain" [facility]

## 2018-07-05 NOTE — Telephone Encounter (Signed)
Please advise. Thanks.  

## 2018-07-07 NOTE — Telephone Encounter (Signed)
Ok for t 3 1 po q 12 # 30

## 2018-07-09 MED ORDER — ACETAMINOPHEN-CODEINE #3 300-30 MG PO TABS: 1.0000 | ORAL_TABLET | Freq: Two times a day (BID) | ORAL | 0 refills | Status: DC | PRN

## 2018-07-09 NOTE — Addendum Note (Signed)
Addended byLaurann Montana on: 07/09/2018 09:41 AM   Modules accepted: Orders

## 2018-07-11 ENCOUNTER — Ambulatory Visit (INDEPENDENT_AMBULATORY_CARE_PROVIDER_SITE_OTHER): Payer: Medicaid Other | Admitting: Orthopedic Surgery

## 2018-07-11 ENCOUNTER — Encounter: Payer: Self-pay | Admitting: Orthopedic Surgery

## 2018-07-11 ENCOUNTER — Other Ambulatory Visit: Payer: Self-pay

## 2018-07-11 DIAGNOSIS — M84361G Stress fracture, right tibia, subsequent encounter for fracture with delayed healing: Secondary | ICD-10-CM

## 2018-07-11 MED ORDER — ACETAMINOPHEN-CODEINE #3 300-30 MG PO TABS: ORAL_TABLET | ORAL | 0 refills | Status: DC

## 2018-07-11 MED FILL — ACETAMINOPHEN/COD #3 TABLET: 300-30 | 8 days supply | Qty: 35 | Fill #0

## 2018-07-11 NOTE — Addendum Note (Signed)
Addended byLaurann Montana on: 07/11/2018 01:27 PM   Modules accepted: Orders
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
## 2018-07-12 ENCOUNTER — Other Ambulatory Visit: Payer: Self-pay

## 2018-07-12 ENCOUNTER — Telehealth: Payer: Self-pay | Admitting: Cardiovascular Disease

## 2018-07-12 LAB — VITAMIN D 25 HYDROXY (VIT D DEFICIENCY, FRACTURES): Vit D, 25-Hydroxy: 14 ng/mL — ABNORMAL LOW (ref 30–100)

## 2018-07-12 MED ORDER — VITAMIN D (ERGOCALCIFEROL) 1.25 MG (50000 UNIT) PO CAPS: ORAL_CAPSULE | ORAL | 0 refills | Status: DC

## 2018-07-12 MED FILL — VIT D2 1.25 MG (50,000 UNIT: 1.25 MG | 56 days supply | Qty: 8 | Fill #0

## 2018-07-12 NOTE — Telephone Encounter (Signed)
New Message   Josh from [employer] calling to make sure disability papers requisition #[id number] has been received via fax number [phone].  Not sure where that fax is located so gave him [phone] to fax it over again.

## 2018-07-12 NOTE — Progress Notes (Signed)
Please put her on 50,000 units/week for 8 weeks with recheck in 8 weeks for repeat blood draw.  Please cancel her 6-week appointment and we will see her in 8 weeks

## 2018-07-20 NOTE — Telephone Encounter (Signed)
Message routed to [facility]

## 2018-07-20 NOTE — Telephone Encounter (Signed)
F/U Message            Lindsay Rivera from [employer] is calling and checking the status of the paper work. Pls call to advise.

## 2018-07-23 ENCOUNTER — Encounter: Payer: Self-pay | Admitting: Physical Medicine & Rehabilitation

## 2018-07-25 ENCOUNTER — Telehealth: Payer: Self-pay | Admitting: Family Medicine

## 2018-07-25 NOTE — Telephone Encounter (Signed)
Tim called stating that paperwork for disability was sent over for patient and would like to know if it was received. Please follow up.

## 2018-07-25 NOTE — Telephone Encounter (Signed)
I am not sure that I have seen this paperwork but will look for it. I do not determine if patient receives disability that is determined by the social security administration.

## 2018-07-26 NOTE — Telephone Encounter (Signed)
Spoke with patient and informed her with what provider stated and she verbalized understanding. Per pt she will call staff back by July 1st to make sure the forms are completed and faxed back to the disability office. Per pt she have to go in front of the judge by July 10th and she wants to make sure all of her information is submitted to her lawyer before then.

## 2018-07-27 ENCOUNTER — Telehealth: Payer: Self-pay | Admitting: Family Medicine

## 2018-07-27 MED FILL — VIT D2 1.25 MG (50,000 UNIT: 1.25 MG | 56 days supply | Qty: 8 | Fill #0

## 2018-07-27 NOTE — Telephone Encounter (Signed)
Unable to LVM to Pt since we received an application from [facility] for the CAFA and OC, Pt was missing all new documents, like [place] letter ( new for the month), taxes, ID, FS, we mail the application back to her with a letter informed her with this msg

## 2018-07-31 ENCOUNTER — Encounter: Payer: Self-pay | Admitting: *Deleted

## 2018-08-07 ENCOUNTER — Ambulatory Visit: Payer: Self-pay | Admitting: Physical Medicine & Rehabilitation

## 2018-08-08 ENCOUNTER — Other Ambulatory Visit: Payer: Self-pay | Admitting: Family Medicine

## 2018-08-08 ENCOUNTER — Other Ambulatory Visit: Payer: Self-pay | Admitting: Internal Medicine

## 2018-08-08 DIAGNOSIS — R21 Rash and other nonspecific skin eruption: Secondary | ICD-10-CM

## 2018-08-08 MED FILL — CLOPIDOGREL 75 MG TABLET: 75 | 30 days supply | Qty: 30 | Fill #1

## 2018-08-08 MED FILL — ?PANTOPRAZOLE SO DR 40MG TA: 40 | 30 days supply | Qty: 30 | Fill #8

## 2018-08-08 MED FILL — POTASSIUM CHLORIDE ER 10 ME: 10 | 30 days supply | Qty: 60 | Fill #3

## 2018-08-08 MED FILL — ?CARVEDILOL 3.125 MG TABLET: 3.125 | 30 days supply | Qty: 60 | Fill #1

## 2018-08-08 MED FILL — ?ROSUVASTATIN CALCIUM 20MG: 20 | 90 days supply | Qty: 90 | Fill #1

## 2018-08-08 MED FILL — !LINZESS 145 MCG CAPSULE: 145 | 30 days supply | Qty: 30 | Fill #0

## 2018-08-08 MED FILL — ?LEVOTHYROXINE 112 MCG TAB: 112 | 30 days supply | Qty: 30 | Fill #2

## 2018-08-08 MED FILL — FLUoxetine HCL 20 MG CAPS: 20 | 30 days supply | Qty: 30 | Fill #1

## 2018-08-08 MED FILL — ?AMLODIPINE BESYLATE 5MG TA: 5 | 30 days supply | Qty: 30 | Fill #1

## 2018-08-08 MED FILL — metFORMIN HCL ER 500 MG TB2: 500 | 30 days supply | Qty: 30 | Fill #2

## 2018-08-09 ENCOUNTER — Telehealth: Payer: Self-pay | Admitting: Cardiovascular Disease

## 2018-08-09 MED FILL — ?VALACYCLOVIR HCL 500MG TAB: 500 | 7 days supply | Qty: 21 | Fill #0

## 2018-08-09 NOTE — Telephone Encounter (Signed)
No message needed °

## 2018-08-09 NOTE — Telephone Encounter (Signed)
New message   Lynann Bologna from [employer] is calling to check on the status of the disability paperwork. Please call.

## 2018-08-22 ENCOUNTER — Ambulatory Visit: Payer: Medicaid Other | Admitting: Orthopedic Surgery

## 2018-09-05 ENCOUNTER — Ambulatory Visit: Payer: Medicaid Other | Admitting: Orthopedic Surgery

## 2018-09-12 ENCOUNTER — Other Ambulatory Visit: Payer: Self-pay | Admitting: Family Medicine

## 2018-09-12 DIAGNOSIS — H5713 Ocular pain, bilateral: Secondary | ICD-10-CM

## 2018-09-12 DIAGNOSIS — E039 Hypothyroidism, unspecified: Secondary | ICD-10-CM

## 2018-09-12 DIAGNOSIS — E089 Diabetes mellitus due to underlying condition without complications: Secondary | ICD-10-CM

## 2018-09-12 MED FILL — ?CARVEDILOL 3.125 MG TABLET: 3.125 | 30 days supply | Qty: 60 | Fill #2

## 2018-09-12 MED FILL — ?AMLODIPINE BESYLATE 5MG TA: 5 | 30 days supply | Qty: 30 | Fill #2

## 2018-09-12 MED FILL — ?PANTOPRAZOLE SO DR 40MG TA: 40 | 30 days supply | Qty: 30 | Fill #9

## 2018-09-12 MED FILL — FLUoxetine HCL 20 MG CAPS: 20 | 30 days supply | Qty: 30 | Fill #2

## 2018-09-12 MED FILL — VALACYCLOVIR HCL 500 MG TAB: 500 | 3 days supply | Qty: 6 | Fill #7

## 2018-09-12 MED FILL — CLOPIDOGREL 75 MG TABLET: 75 | 30 days supply | Qty: 30 | Fill #2

## 2018-09-13 ENCOUNTER — Other Ambulatory Visit: Payer: Self-pay | Admitting: *Deleted

## 2018-09-13 MED ORDER — LINACLOTIDE 145 MCG PO CAPS: ORAL_CAPSULE | ORAL | 0 refills | Status: DC

## 2018-09-13 MED FILL — !LINZESS 145 MCG CAPSULE: 145 | 30 days supply | Qty: 30 | Fill #0

## 2018-09-17 MED FILL — metFORMIN HCL ER 500 MG TB2: 500 | 30 days supply | Qty: 30 | Fill #0

## 2018-09-17 MED FILL — LEVOTHYROXINE 112 MCG TAB: 112 | 30 days supply | Qty: 30 | Fill #0

## 2018-10-05 ENCOUNTER — Encounter: Payer: Self-pay | Admitting: Family Medicine

## 2018-10-05 ENCOUNTER — Other Ambulatory Visit: Payer: Self-pay

## 2018-10-05 ENCOUNTER — Ambulatory Visit: Payer: Medicaid Other | Attending: Family Medicine | Admitting: Family Medicine

## 2018-10-05 VITALS — BP 135/82 | HR 77 | Temp 98.2°F | Resp 18 | Ht 62.0 in | Wt 165.0 lb

## 2018-10-05 DIAGNOSIS — I252 Old myocardial infarction: Secondary | ICD-10-CM | POA: Insufficient documentation

## 2018-10-05 DIAGNOSIS — K219 Gastro-esophageal reflux disease without esophagitis: Secondary | ICD-10-CM | POA: Insufficient documentation

## 2018-10-05 DIAGNOSIS — Z7902 Long term (current) use of antithrombotics/antiplatelets: Secondary | ICD-10-CM | POA: Insufficient documentation

## 2018-10-05 DIAGNOSIS — E119 Type 2 diabetes mellitus without complications: Secondary | ICD-10-CM

## 2018-10-05 DIAGNOSIS — M62838 Other muscle spasm: Secondary | ICD-10-CM | POA: Diagnosis not present

## 2018-10-05 DIAGNOSIS — Z7982 Long term (current) use of aspirin: Secondary | ICD-10-CM | POA: Diagnosis not present

## 2018-10-05 DIAGNOSIS — Z87891 Personal history of nicotine dependence: Secondary | ICD-10-CM | POA: Diagnosis not present

## 2018-10-05 DIAGNOSIS — F319 Bipolar disorder, unspecified: Secondary | ICD-10-CM | POA: Insufficient documentation

## 2018-10-05 DIAGNOSIS — Z951 Presence of aortocoronary bypass graft: Secondary | ICD-10-CM | POA: Insufficient documentation

## 2018-10-05 DIAGNOSIS — Z791 Long term (current) use of non-steroidal anti-inflammatories (NSAID): Secondary | ICD-10-CM | POA: Insufficient documentation

## 2018-10-05 DIAGNOSIS — E785 Hyperlipidemia, unspecified: Secondary | ICD-10-CM | POA: Diagnosis not present

## 2018-10-05 DIAGNOSIS — J45909 Unspecified asthma, uncomplicated: Secondary | ICD-10-CM | POA: Diagnosis not present

## 2018-10-05 DIAGNOSIS — E039 Hypothyroidism, unspecified: Secondary | ICD-10-CM

## 2018-10-05 DIAGNOSIS — Z7984 Long term (current) use of oral hypoglycemic drugs: Secondary | ICD-10-CM | POA: Insufficient documentation

## 2018-10-05 DIAGNOSIS — W228XXA Striking against or struck by other objects, initial encounter: Secondary | ICD-10-CM | POA: Diagnosis not present

## 2018-10-05 DIAGNOSIS — R630 Anorexia: Secondary | ICD-10-CM

## 2018-10-05 DIAGNOSIS — F419 Anxiety disorder, unspecified: Secondary | ICD-10-CM | POA: Diagnosis not present

## 2018-10-05 DIAGNOSIS — R200 Anesthesia of skin: Secondary | ICD-10-CM

## 2018-10-05 DIAGNOSIS — Z880 Allergy status to penicillin: Secondary | ICD-10-CM | POA: Insufficient documentation

## 2018-10-05 DIAGNOSIS — M47819 Spondylosis without myelopathy or radiculopathy, site unspecified: Secondary | ICD-10-CM | POA: Insufficient documentation

## 2018-10-05 DIAGNOSIS — Z833 Family history of diabetes mellitus: Secondary | ICD-10-CM | POA: Insufficient documentation

## 2018-10-05 DIAGNOSIS — Z8673 Personal history of transient ischemic attack (TIA), and cerebral infarction without residual deficits: Secondary | ICD-10-CM | POA: Diagnosis not present

## 2018-10-05 DIAGNOSIS — I509 Heart failure, unspecified: Secondary | ICD-10-CM | POA: Insufficient documentation

## 2018-10-05 DIAGNOSIS — R1310 Dysphagia, unspecified: Secondary | ICD-10-CM

## 2018-10-05 DIAGNOSIS — Z79890 Hormone replacement therapy (postmenopausal): Secondary | ICD-10-CM | POA: Insufficient documentation

## 2018-10-05 DIAGNOSIS — Z23 Encounter for immunization: Secondary | ICD-10-CM | POA: Diagnosis not present

## 2018-10-05 DIAGNOSIS — Z79899 Other long term (current) drug therapy: Secondary | ICD-10-CM | POA: Insufficient documentation

## 2018-10-05 DIAGNOSIS — M79675 Pain in left toe(s): Secondary | ICD-10-CM | POA: Diagnosis not present

## 2018-10-05 DIAGNOSIS — Z955 Presence of coronary angioplasty implant and graft: Secondary | ICD-10-CM | POA: Insufficient documentation

## 2018-10-05 DIAGNOSIS — E089 Diabetes mellitus due to underlying condition without complications: Secondary | ICD-10-CM

## 2018-10-05 DIAGNOSIS — G894 Chronic pain syndrome: Secondary | ICD-10-CM | POA: Diagnosis not present

## 2018-10-05 DIAGNOSIS — Z800 Family history of malignant neoplasm of digestive organs: Secondary | ICD-10-CM | POA: Insufficient documentation

## 2018-10-05 DIAGNOSIS — Z8249 Family history of ischemic heart disease and other diseases of the circulatory system: Secondary | ICD-10-CM | POA: Insufficient documentation

## 2018-10-05 DIAGNOSIS — I110 Hypertensive heart disease with heart failure: Secondary | ICD-10-CM | POA: Insufficient documentation

## 2018-10-05 LAB — POCT GLYCOSYLATED HEMOGLOBIN (HGB A1C): Hemoglobin A1C: 6.5 % — AB (ref 4.0–5.6)

## 2018-10-05 MED ORDER — TIZANIDINE HCL 4 MG PO CAPS: ORAL_CAPSULE | ORAL | 1 refills | Status: DC

## 2018-10-05 MED FILL — tiZANidine HCL 4 MG TABS: 4 | 30 days supply | Qty: 60 | Fill #0

## 2018-10-05 NOTE — Progress Notes (Signed)
Patient complains of unsteady gait worsening due to numbness mainly on the right side.
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
## 2018-10-08 ENCOUNTER — Other Ambulatory Visit: Payer: Self-pay | Admitting: Family Medicine

## 2018-10-08 DIAGNOSIS — R7689 Other specified abnormal immunological findings in serum: Secondary | ICD-10-CM

## 2018-10-08 DIAGNOSIS — R768 Other specified abnormal immunological findings in serum: Secondary | ICD-10-CM

## 2018-10-08 DIAGNOSIS — Z79899 Other long term (current) drug therapy: Secondary | ICD-10-CM

## 2018-10-08 LAB — CBC WITH DIFFERENTIAL/PLATELET
Basophils Absolute: 0.1 x10E3/uL (ref 0.0–0.2)
Basos: 1 %
EOS (ABSOLUTE): 0.2 x10E3/uL (ref 0.0–0.4)
Eos: 2 %
Hematocrit: 40.2 % (ref 34.0–46.6)
Hemoglobin: 12.9 g/dL (ref 11.1–15.9)
Immature Grans (Abs): 0.1 x10E3/uL (ref 0.0–0.1)
Immature Granulocytes: 1 %
Lymphocytes Absolute: 1.6 x10E3/uL (ref 0.7–3.1)
Lymphs: 15 %
MCH: 26.3 pg — ABNORMAL LOW (ref 26.6–33.0)
MCHC: 32.1 g/dL (ref 31.5–35.7)
MCV: 82 fL (ref 79–97)
Monocytes Absolute: 0.4 x10E3/uL (ref 0.1–0.9)
Monocytes: 4 %
Neutrophils Absolute: 8.6 x10E3/uL — ABNORMAL HIGH (ref 1.4–7.0)
Neutrophils: 77 %
Platelets: 364 x10E3/uL (ref 150–450)
RBC: 4.9 x10E6/uL (ref 3.77–5.28)
RDW: 15.7 % — ABNORMAL HIGH (ref 11.7–15.4)
WBC: 10.9 x10E3/uL — ABNORMAL HIGH (ref 3.4–10.8)

## 2018-10-08 LAB — COMPREHENSIVE METABOLIC PANEL WITH GFR
ALT: 11 IU/L (ref 0–32)
AST: 13 IU/L (ref 0–40)
Albumin/Globulin Ratio: 1.5 (ref 1.2–2.2)
Albumin: 4.3 g/dL (ref 3.8–4.9)
Alkaline Phosphatase: 133 IU/L — ABNORMAL HIGH (ref 39–117)
BUN/Creatinine Ratio: 8 — ABNORMAL LOW (ref 9–23)
BUN: 6 mg/dL (ref 6–24)
Bilirubin Total: 0.3 mg/dL (ref 0.0–1.2)
CO2: 24 mmol/L (ref 20–29)
Calcium: 9.6 mg/dL (ref 8.7–10.2)
Chloride: 104 mmol/L (ref 96–106)
Creatinine, Ser: 0.73 mg/dL (ref 0.57–1.00)
GFR calc Af Amer: 105 mL/min/1.73
GFR calc non Af Amer: 91 mL/min/1.73
Globulin, Total: 2.8 g/dL (ref 1.5–4.5)
Glucose: 144 mg/dL — ABNORMAL HIGH (ref 65–99)
Potassium: 3.6 mmol/L (ref 3.5–5.2)
Sodium: 143 mmol/L (ref 134–144)
Total Protein: 7.1 g/dL (ref 6.0–8.5)

## 2018-10-08 LAB — T4 AND TSH
T4, Total: 8.8 ug/dL (ref 4.5–12.0)
TSH: 1.56 u[IU]/mL (ref 0.450–4.500)

## 2018-10-08 LAB — H. PYLORI ANTIBODY, IGG: H. pylori, IgG AbS: 6.1 {index_val} — ABNORMAL HIGH (ref 0.00–0.79)

## 2018-10-08 MED ORDER — METRONIDAZOLE 500 MG PO TABS: 500.0000 mg | ORAL_TABLET | Freq: Two times a day (BID) | ORAL | 0 refills | Status: DC

## 2018-10-08 MED ORDER — PANTOPRAZOLE SODIUM 40 MG PO TBEC: 40.0000 mg | DELAYED_RELEASE_TABLET | Freq: Two times a day (BID) | ORAL | 1 refills | Status: DC

## 2018-10-08 MED ORDER — CLARITHROMYCIN 500 MG PO TABS: 500.0000 mg | ORAL_TABLET | Freq: Two times a day (BID) | ORAL | 0 refills | Status: AC

## 2018-10-08 NOTE — Progress Notes (Signed)
Patient ID: Lindsay Rivera, female   DOB: 12-17-1960, 58 y.o.   MRN: [id number]   Please notify patient that she had a positive blood test for antibodies to H. pylori which is a bacteria that can cause stomach ulcers.  A prescription has been sent into her pharmacy to take metronidazole and clarithromycin twice daily for treatment along with increasing her pantoprazole to twice daily.  Patient will take 2 antibiotics and medication to decrease stomach ulcers twice daily.  She should also keep her upcoming appointment with GI.

## 2018-10-09 MED FILL — CLARITHROMYCIN 500 MG TAB: 500 | 7 days supply | Qty: 14 | Fill #0

## 2018-10-09 MED FILL — ?PANTOPRAZOLE SODI DR 40MGT: 40 | 30 days supply | Qty: 60 | Fill #0

## 2018-10-09 MED FILL — ?METRONIDAZOLE 500 MG TABS: 500 | 7 days supply | Qty: 14 | Fill #0

## 2018-10-17 ENCOUNTER — Encounter: Payer: Self-pay | Admitting: Physician Assistant

## 2018-10-17 ENCOUNTER — Ambulatory Visit (INDEPENDENT_AMBULATORY_CARE_PROVIDER_SITE_OTHER): Payer: Medicaid Other | Admitting: Physician Assistant

## 2018-10-17 VITALS — BP 112/64 | HR 68 | Ht 62.0 in | Wt 163.0 lb

## 2018-10-17 DIAGNOSIS — A048 Other specified bacterial intestinal infections: Secondary | ICD-10-CM | POA: Diagnosis not present

## 2018-10-17 DIAGNOSIS — Z86010 Personal history of colonic polyps: Secondary | ICD-10-CM

## 2018-10-17 DIAGNOSIS — R1310 Dysphagia, unspecified: Secondary | ICD-10-CM | POA: Diagnosis not present

## 2018-10-17 DIAGNOSIS — K5900 Constipation, unspecified: Secondary | ICD-10-CM | POA: Diagnosis not present

## 2018-10-17 DIAGNOSIS — Z860101 Personal history of adenomatous and serrated colon polyps: Secondary | ICD-10-CM

## 2018-10-17 MED ORDER — LINACLOTIDE 290 MCG PO CAPS: 290.0000 ug | ORAL_CAPSULE | Freq: Every day | ORAL | 3 refills | Status: DC

## 2018-10-17 MED FILL — LINZESS 290 MCG CAPSULE: 290 | 30 days supply | Qty: 30 | Fill #0

## 2018-10-17 NOTE — Patient Instructions (Signed)
We have sent the following medications to your pharmacy for you to pick up at your convenience: Linzess 290 mg once a day   You have been scheduled for a Barium Esophogram at [facility] (1st floor of the hospital) on 10-29-2018 at 9:30 am. Please arrive 15 minutes prior to your appointment for registration. Make certain not to have anything to eat or drink 3 hours prior to your test. If you need to reschedule for any reason, please contact radiology at [phone] to do so. __________________________________________________________________ A barium swallow is an examination that concentrates on views of the esophagus. This tends to be a double contrast exam (barium and two liquids which, when combined, create a gas to distend the wall of the oesophagus) or single contrast (non-ionic iodine based). The study is usually tailored to your symptoms so a good history is essential. Attention is paid during the study to the form, structure and configuration of the esophagus, looking for functional disorders (such as aspiration, dysphagia, achalasia, motility and reflux) EXAMINATION You may be asked to change into a gown, depending on the type of swallow being performed. A radiologist and radiographer will perform the procedure. The radiologist will advise you of the type of contrast selected for your procedure and direct you during the exam. You will be asked to stand, sit or lie in several different positions and to hold a small amount of fluid in your mouth before being asked to swallow while the imaging is performed .In some instances you may be asked to swallow barium coated marshmallows to assess the motility of a solid food bolus. The exam can be recorded as a digital or video fluoroscopy procedure. POST PROCEDURE It will take 1-2 days for the barium to pass through your system. To facilitate this, it is important, unless otherwise directed, to increase your fluids for the next 24-48hrs and to  resume your normal diet.  This test typically takes about 30 minutes to perform. __________________________________________________________________________________
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
## 2018-10-18 ENCOUNTER — Other Ambulatory Visit: Payer: Self-pay | Admitting: Physician Assistant

## 2018-10-18 MED FILL — CARVEDILOL 3.125 MG TABLET: 3.125 | 30 days supply | Qty: 60 | Fill #3

## 2018-10-18 MED FILL — VALACYCLOVIR HCL 500 MG TAB: 500 | 3 days supply | Qty: 6 | Fill #8

## 2018-10-18 MED FILL — AMLODIPINE BESYLATE 5 MG TA: 5 | 30 days supply | Qty: 30 | Fill #3

## 2018-10-18 MED FILL — CLOPIDOGREL 75 MG TABLET: 75 | 30 days supply | Qty: 30 | Fill #3

## 2018-10-18 MED FILL — METFORMIN HCL ER 500 MG TB2: 500 | 30 days supply | Qty: 30 | Fill #1

## 2018-10-18 MED FILL — LEVOTHYROXINE 112 MCG TAB: 112 | 30 days supply | Qty: 30 | Fill #1

## 2018-10-19 ENCOUNTER — Other Ambulatory Visit: Payer: Self-pay

## 2018-10-19 ENCOUNTER — Ambulatory Visit: Payer: Medicaid Other | Attending: Family Medicine

## 2018-10-19 DIAGNOSIS — Z20822 Contact with and (suspected) exposure to covid-19: Secondary | ICD-10-CM

## 2018-10-19 MED FILL — POTASSIUM CHLORIDE ER 10 ME: 10 | 30 days supply | Qty: 60 | Fill #0

## 2018-10-19 NOTE — Progress Notes (Signed)
Patient arrived for Covid test. Patient has no symptoms as far as covid she wants test done so that she can go to [place] to see her new grand baby.  Patient was given test and informed that she will be contacted once results are back.

## 2018-10-21 ENCOUNTER — Encounter: Payer: Self-pay | Admitting: Family Medicine

## 2018-10-21 LAB — NOVEL CORONAVIRUS, NAA: SARS-CoV-2, NAA: NOT DETECTED

## 2018-10-29 ENCOUNTER — Other Ambulatory Visit: Payer: Self-pay

## 2018-10-29 ENCOUNTER — Ambulatory Visit (HOSPITAL_COMMUNITY)
Admission: RE | Admit: 2018-10-29 | Discharge: 2018-10-29 | Disposition: A | Discharge status: Home / Self Care | Payer: Medicaid Other | Source: Ambulatory Visit | Attending: Physician Assistant | Admitting: Physician Assistant

## 2018-10-29 DIAGNOSIS — R1310 Dysphagia, unspecified: Secondary | ICD-10-CM | POA: Diagnosis not present

## 2018-10-29 MED FILL — QUETIAPINE FUMARATE 300 MG: 300 | 30 days supply | Qty: 30 | Fill #0

## 2018-10-29 MED FILL — FLUoxetine HCL 40 MG CAPS: 40 | 30 days supply | Qty: 30 | Fill #0

## 2018-10-31 ENCOUNTER — Encounter: Payer: Self-pay | Admitting: Family Medicine

## 2018-10-31 ENCOUNTER — Other Ambulatory Visit: Payer: Self-pay

## 2018-10-31 ENCOUNTER — Telehealth: Payer: Self-pay

## 2018-10-31 ENCOUNTER — Ambulatory Visit: Payer: Medicaid Other | Attending: Family Medicine | Admitting: Family Medicine

## 2018-10-31 VITALS — BP 103/66 | HR 63 | Temp 98.4°F | Wt 163.0 lb

## 2018-10-31 DIAGNOSIS — I110 Hypertensive heart disease with heart failure: Secondary | ICD-10-CM | POA: Insufficient documentation

## 2018-10-31 DIAGNOSIS — K219 Gastro-esophageal reflux disease without esophagitis: Secondary | ICD-10-CM

## 2018-10-31 DIAGNOSIS — R1030 Lower abdominal pain, unspecified: Secondary | ICD-10-CM | POA: Diagnosis not present

## 2018-10-31 DIAGNOSIS — E089 Diabetes mellitus due to underlying condition without complications: Secondary | ICD-10-CM

## 2018-10-31 DIAGNOSIS — Z79890 Hormone replacement therapy (postmenopausal): Secondary | ICD-10-CM | POA: Diagnosis not present

## 2018-10-31 DIAGNOSIS — E119 Type 2 diabetes mellitus without complications: Secondary | ICD-10-CM | POA: Diagnosis not present

## 2018-10-31 DIAGNOSIS — R1013 Epigastric pain: Secondary | ICD-10-CM

## 2018-10-31 DIAGNOSIS — K224 Dyskinesia of esophagus: Secondary | ICD-10-CM | POA: Diagnosis not present

## 2018-10-31 DIAGNOSIS — R630 Anorexia: Secondary | ICD-10-CM | POA: Insufficient documentation

## 2018-10-31 DIAGNOSIS — R102 Pelvic and perineal pain: Secondary | ICD-10-CM

## 2018-10-31 DIAGNOSIS — E039 Hypothyroidism, unspecified: Secondary | ICD-10-CM | POA: Insufficient documentation

## 2018-10-31 DIAGNOSIS — F319 Bipolar disorder, unspecified: Secondary | ICD-10-CM | POA: Diagnosis not present

## 2018-10-31 DIAGNOSIS — F419 Anxiety disorder, unspecified: Secondary | ICD-10-CM | POA: Diagnosis not present

## 2018-10-31 DIAGNOSIS — Z7902 Long term (current) use of antithrombotics/antiplatelets: Secondary | ICD-10-CM | POA: Diagnosis not present

## 2018-10-31 DIAGNOSIS — Z951 Presence of aortocoronary bypass graft: Secondary | ICD-10-CM | POA: Insufficient documentation

## 2018-10-31 DIAGNOSIS — R1011 Right upper quadrant pain: Secondary | ICD-10-CM

## 2018-10-31 DIAGNOSIS — Z79899 Other long term (current) drug therapy: Secondary | ICD-10-CM | POA: Insufficient documentation

## 2018-10-31 DIAGNOSIS — R8290 Unspecified abnormal findings in urine: Secondary | ICD-10-CM | POA: Diagnosis not present

## 2018-10-31 DIAGNOSIS — E785 Hyperlipidemia, unspecified: Secondary | ICD-10-CM | POA: Insufficient documentation

## 2018-10-31 DIAGNOSIS — Z09 Encounter for follow-up examination after completed treatment for conditions other than malignant neoplasm: Secondary | ICD-10-CM | POA: Insufficient documentation

## 2018-10-31 DIAGNOSIS — Z91040 Latex allergy status: Secondary | ICD-10-CM | POA: Insufficient documentation

## 2018-10-31 DIAGNOSIS — R140 Abdominal distension (gaseous): Secondary | ICD-10-CM | POA: Insufficient documentation

## 2018-10-31 DIAGNOSIS — I509 Heart failure, unspecified: Secondary | ICD-10-CM | POA: Diagnosis not present

## 2018-10-31 DIAGNOSIS — Z7982 Long term (current) use of aspirin: Secondary | ICD-10-CM | POA: Diagnosis not present

## 2018-10-31 DIAGNOSIS — E1165 Type 2 diabetes mellitus with hyperglycemia: Secondary | ICD-10-CM | POA: Diagnosis not present

## 2018-10-31 DIAGNOSIS — Z7984 Long term (current) use of oral hypoglycemic drugs: Secondary | ICD-10-CM | POA: Insufficient documentation

## 2018-10-31 DIAGNOSIS — I252 Old myocardial infarction: Secondary | ICD-10-CM | POA: Insufficient documentation

## 2018-10-31 DIAGNOSIS — Z8673 Personal history of transient ischemic attack (TIA), and cerebral infarction without residual deficits: Secondary | ICD-10-CM | POA: Insufficient documentation

## 2018-10-31 DIAGNOSIS — R195 Other fecal abnormalities: Secondary | ICD-10-CM | POA: Diagnosis not present

## 2018-10-31 DIAGNOSIS — J45909 Unspecified asthma, uncomplicated: Secondary | ICD-10-CM | POA: Diagnosis not present

## 2018-10-31 DIAGNOSIS — R1024 Suprapubic pain: Secondary | ICD-10-CM

## 2018-10-31 DIAGNOSIS — Z87891 Personal history of nicotine dependence: Secondary | ICD-10-CM | POA: Insufficient documentation

## 2018-10-31 DIAGNOSIS — R1010 Upper abdominal pain, unspecified: Secondary | ICD-10-CM | POA: Diagnosis not present

## 2018-10-31 DIAGNOSIS — Z880 Allergy status to penicillin: Secondary | ICD-10-CM | POA: Insufficient documentation

## 2018-10-31 DIAGNOSIS — Z8249 Family history of ischemic heart disease and other diseases of the circulatory system: Secondary | ICD-10-CM | POA: Insufficient documentation

## 2018-10-31 DIAGNOSIS — Z955 Presence of coronary angioplasty implant and graft: Secondary | ICD-10-CM | POA: Insufficient documentation

## 2018-10-31 DIAGNOSIS — Z833 Family history of diabetes mellitus: Secondary | ICD-10-CM | POA: Insufficient documentation

## 2018-10-31 LAB — POCT URINALYSIS DIP (CLINITEK)
Blood, UA: NEGATIVE
Glucose, UA: NEGATIVE mg/dL
Ketones, POC UA: NEGATIVE mg/dL
Nitrite, UA: NEGATIVE
POC PROTEIN,UA: NEGATIVE
Spec Grav, UA: 1.025
Urobilinogen, UA: 0.2 U/dL
pH, UA: 5.5

## 2018-10-31 LAB — GLUCOSE, POCT (MANUAL RESULT ENTRY): POC Glucose: 114 mg/dL — AB (ref 70–99)

## 2018-10-31 NOTE — Patient Instructions (Signed)
Gallbladder Eating Plan °If you have a gallbladder condition, you may have trouble digesting fats. Eating a low-fat diet can help reduce your symptoms, and may be helpful before and after having surgery to remove your gallbladder (cholecystectomy). Your health care provider may recommend that you work with a diet and nutrition specialist (dietitian) to help you reduce the amount of fat in your diet. °What are tips for following this plan? °General guidelines °· Limit your fat intake to less than 30% of your total daily calories. If you eat around 1,800 calories each day, this is less than 60 grams (g) of fat per day. °· Fat is an important part of a healthy diet. Eating a low-fat diet can make it hard to maintain a healthy body weight. Ask your dietitian how much fat, calories, and other nutrients you need each day. °· Eat small, frequent meals throughout the day instead of three large meals. °· Drink at least 8-10 cups of fluid a day. Drink enough fluid to keep your urine clear or pale yellow. °· Limit alcohol intake to no more than 1 drink a day for nonpregnant women and 2 drinks a day for men. One drink equals 12 oz of beer, 5 oz of wine, or 1½ oz of hard liquor. °Reading food labels °· Check Nutrition Facts on food labels for the amount of fat per serving. Choose foods with less than 3 grams of fat per serving. °Shopping °· Choose nonfat and low-fat healthy foods. Look for the words “nonfat,” “low fat,” or “fat free.” °· Avoid buying processed or prepackaged foods. °Cooking °· Cook using low-fat methods, such as baking, broiling, grilling, or boiling. °· Cook with small amounts of healthy fats, such as olive oil, grapeseed oil, canola oil, or sunflower oil. °What foods are recommended? ° °· All fresh, frozen, or canned fruits and vegetables. °· Whole grains. °· Low-fat or non-fat (skim) milk and yogurt. °· Lean meat, skinless poultry, fish, eggs, and beans. °· Low-fat protein supplement powders or  drinks. °· Spices and herbs. °What foods are not recommended? °· High-fat foods. These include baked goods, fast food, fatty cuts of meat, ice cream, french toast, sweet rolls, pizza, cheese bread, foods covered with butter, creamy sauces, or cheese. °· Fried foods. These include french fries, tempura, battered fish, breaded chicken, fried breads, and sweets. °· Foods with strong odors. °· Foods that cause bloating and gas. °Summary °· A low-fat diet can be helpful if you have a gallbladder condition, or before and after gallbladder surgery. °· Limit your fat intake to less than 30% of your total daily calories. This is about 60 g of fat if you eat 1,800 calories each day. °· Eat small, frequent meals throughout the day instead of three large meals. °This information is not intended to replace advice given to you by your health care provider. Make sure you discuss any questions you have with your health care provider. °Document Released: 01/29/2013 Document Revised: 05/17/2018 Document Reviewed: 03/03/2016 °Elsevier Patient Education © 2020 [employer] ° °
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
## 2018-10-31 NOTE — Progress Notes (Signed)
Pt. Stated she is still having stomach pain after the antibiotic that was given for her stomach bacterial infection.

## 2018-10-31 NOTE — Telephone Encounter (Signed)
OK to hold plavix for 5 days prior to endoscopy procedure

## 2018-10-31 NOTE — Telephone Encounter (Signed)
Dr. Acie Fredrickson to review. Patient had CAD s/p CABG, then in 2017, she underwent left main stent. Last cath in 11/2017 showed stable anatomy.   Dr. Acie Fredrickson, ok with you to hold plavix for 5 days? Please sent your response to [facility] PREOP

## 2018-10-31 NOTE — Telephone Encounter (Signed)
[facility] Pre-operative Risk Assessment     Request for surgical clearance:     Endoscopy  Procedure  What type of surgery is being performed?     EGD/Colonoscopy  When is this surgery scheduled?     11/15/18  What type of clearance is required ?   Pharmacy  Are there any medications that need to be held prior to surgery and how long?   Plavix for 5 days  Practice name and name of physician performing surgery?      [facility]  What is your office phone and fax number?      Phone- [phone]  [phone]  Anesthesia type (None, local, MAC, general) ?       MAC

## 2018-11-01 NOTE — Telephone Encounter (Signed)
Spoke to pt and gave recommendations. Pt verbalized thanks and understanding. 

## 2018-11-01 NOTE — Telephone Encounter (Signed)
   Primary Cardiologist: Mertie Moores, MD  Chart reviewed as part of pre-operative protocol coverage. Request received for recommendations on holding plavix, not medical clearance.   Per Dr. Acie Fredrickson, [place] to hold plavix for 5 days prior to endoscopy.  I will route this recommendation to the requesting party via Epic fax function and remove from pre-op pool.  Please call with questions.  Gregory, PA 11/01/2018, 2:06 PM

## 2018-11-02 LAB — URINE CULTURE: Organism ID, Bacteria: NO GROWTH

## 2018-11-07 NOTE — Progress Notes (Signed)
Addendum: Reviewed and agree with assessment and management plan. Rickia [place]burg M, MD  

## 2018-11-08 ENCOUNTER — Ambulatory Visit (AMBULATORY_SURGERY_CENTER): Payer: Self-pay

## 2018-11-08 ENCOUNTER — Other Ambulatory Visit: Payer: Self-pay

## 2018-11-08 ENCOUNTER — Ambulatory Visit (HOSPITAL_COMMUNITY)
Admission: RE | Admit: 2018-11-08 | Discharge: 2018-11-08 | Disposition: A | Discharge status: Home / Self Care | Payer: Medicaid Other | Source: Ambulatory Visit | Attending: Family Medicine | Admitting: Family Medicine

## 2018-11-08 VITALS — Temp 96.5°F | Ht 62.0 in | Wt 161.4 lb

## 2018-11-08 DIAGNOSIS — R195 Other fecal abnormalities: Secondary | ICD-10-CM | POA: Insufficient documentation

## 2018-11-08 DIAGNOSIS — R1011 Right upper quadrant pain: Secondary | ICD-10-CM | POA: Insufficient documentation

## 2018-11-08 DIAGNOSIS — R1310 Dysphagia, unspecified: Secondary | ICD-10-CM

## 2018-11-08 DIAGNOSIS — Z86010 Personal history of colonic polyps: Secondary | ICD-10-CM

## 2018-11-08 MED ORDER — NA SULFATE-K SULFATE-MG SULF 17.5-3.13-1.6 GM/177ML PO SOLN: 1.0000 | Freq: Once | ORAL | 0 refills | Status: AC

## 2018-11-08 MED FILL — SUPREP BOWEL PREP KIT: 17.5-3.13-1 | 1 days supply | Qty: 354 | Fill #0

## 2018-11-08 NOTE — Progress Notes (Signed)
Denies allergies to eggs or soy products. Denies complication of anesthesia or sedation. Denies use of weight loss medication. Denies use of O2.   Emmi instructions given for colonoscopy.  

## 2018-11-12 ENCOUNTER — Other Ambulatory Visit: Payer: Self-pay | Admitting: Family Medicine

## 2018-11-12 DIAGNOSIS — R1011 Right upper quadrant pain: Secondary | ICD-10-CM

## 2018-11-12 DIAGNOSIS — R195 Other fecal abnormalities: Secondary | ICD-10-CM

## 2018-11-12 NOTE — Progress Notes (Signed)
Patient ID: Lindsay Rivera, female   DOB: Nov 18, 1960, 58 y.o.   MRN: [id number]   Patient with clay colored stools and right upper quadrant discomfort.  Patient had normal right upper quadrant ultrasound.  HIDA scan will be ordered

## 2018-11-14 ENCOUNTER — Telehealth: Payer: Self-pay

## 2018-11-14 NOTE — Telephone Encounter (Signed)
Covid-19 screening questions   Do you now or have you had a fever in the last 14 days? NO   Do you have any respiratory symptoms of shortness of breath or cough now or in the last 14 days? NO  Do you have any family members or close contacts with diagnosed or suspected Covid-19 in the past 14 days? NO  Have you been tested for Covid-19 and found to be positive? NO        

## 2018-11-15 ENCOUNTER — Other Ambulatory Visit: Payer: Self-pay | Admitting: Internal Medicine

## 2018-11-15 ENCOUNTER — Encounter: Payer: Self-pay | Admitting: Internal Medicine

## 2018-11-15 ENCOUNTER — Ambulatory Visit (AMBULATORY_SURGERY_CENTER): Payer: Medicaid Other | Admitting: Internal Medicine

## 2018-11-15 ENCOUNTER — Other Ambulatory Visit: Payer: Self-pay

## 2018-11-15 VITALS — BP 148/90 | HR 54 | Temp 98.4°F | Resp 11 | Ht 62.0 in | Wt 161.0 lb

## 2018-11-15 DIAGNOSIS — Z86010 Personal history of colonic polyps: Secondary | ICD-10-CM | POA: Diagnosis not present

## 2018-11-15 DIAGNOSIS — R1310 Dysphagia, unspecified: Secondary | ICD-10-CM

## 2018-11-15 DIAGNOSIS — D120 Benign neoplasm of cecum: Secondary | ICD-10-CM

## 2018-11-15 DIAGNOSIS — K449 Diaphragmatic hernia without obstruction or gangrene: Secondary | ICD-10-CM | POA: Diagnosis not present

## 2018-11-15 DIAGNOSIS — K222 Esophageal obstruction: Secondary | ICD-10-CM

## 2018-11-15 MED ORDER — SODIUM CHLORIDE 0.9 % IV SOLN: 500.0000 mL | Freq: Once | INTRAVENOUS | Status: DC

## 2018-11-15 NOTE — Progress Notes (Signed)
Called to room to assist during endoscopic procedure.  Patient ID and intended procedure confirmed with present staff. Received instructions for my participation in the procedure from the performing physician.  

## 2018-11-15 NOTE — Progress Notes (Signed)
A/ox3, pleased with MAC, report to RN 

## 2018-11-15 NOTE — Patient Instructions (Signed)
Information on polyps given to you today.  Await pathology results.  Resume Plavix at prior dose today.  YOU HAD AN ENDOSCOPIC PROCEDURE TODAY AT [facility]:   Refer to the procedure report that was given to you for any specific questions about what was found during the examination.  If the procedure report does not answer your questions, please call your gastroenterologist to clarify.  If you requested that your care partner not be given the details of your procedure findings, then the procedure report has been included in a sealed envelope for you to review at your convenience later.  YOU SHOULD EXPECT: Some feelings of bloating in the abdomen. Passage of more gas than usual.  Walking can help get rid of the air that was put into your GI tract during the procedure and reduce the bloating. If you had a lower endoscopy (such as a colonoscopy or flexible sigmoidoscopy) you may notice spotting of blood in your stool or on the toilet paper. If you underwent a bowel prep for your procedure, you may not have a normal bowel movement for a few days.  Please Note:  You might notice some irritation and congestion in your nose or some drainage.  This is from the oxygen used during your procedure.  There is no need for concern and it should clear up in a day or so.  SYMPTOMS TO REPORT IMMEDIATELY:   Following lower endoscopy (colonoscopy or flexible sigmoidoscopy):  Excessive amounts of blood in the stool  Significant tenderness or worsening of abdominal pains  Swelling of the abdomen that is new, acute  Fever of 100F or higher   Following upper endoscopy (EGD)  Vomiting of blood or coffee ground material  New chest pain or pain under the shoulder blades  Painful or persistently difficult swallowing  New shortness of breath  Fever of 100F or higher  Black, tarry-looking stools  For urgent or emergent issues, a gastroenterologist can be reached at any hour by calling [phone]  [phone].   DIET:  We do recommend a small meal at first, but then you may proceed to your regular diet.  Drink plenty of fluids but you should avoid alcoholic beverages for 24 hours.  ACTIVITY:  You should plan to take it easy for the rest of today and you should NOT DRIVE or use heavy machinery until tomorrow (because of the sedation medicines used during the test).    FOLLOW UP: Our staff will call the number listed on your records 48-72 hours following your procedure to check on you and address any questions or concerns that you may have regarding the information given to you following your procedure. If we do not reach you, we will leave a message.  We will attempt to reach you two times.  During this call, we will ask if you have developed any symptoms of COVID 19. If you develop any symptoms (ie: fever, flu-like symptoms, shortness of breath, cough etc.) before then, please call [phone].  If you test positive for Covid 19 in the 2 weeks post procedure, please call and report this information to [place].    If any biopsies were taken you will be contacted by phone or by letter within the next 1-3 weeks.  Please call us at [phone] if you have not heard about the biopsies in 3 weeks.    SIGNATURES/CONFIDENTIALITY: You and/or your care partner have signed paperwork which will be entered into your electronic medical record.  These signatures  attest to the fact that that the information above on your After Visit Summary has been reviewed and is understood.  Full responsibility of the confidentiality of this discharge information lies with you and/or your care-partner.

## 2018-11-15 NOTE — Op Note (Signed)
[facility] Patient Name: Lindsay Rivera Procedure Date: 11/15/2018 3:39 PM MRN: [id number] Endoscopist: Jerene Bears , MD Age: 58 Referring MD:  Date of Birth: May 09, 1960 Gender: Female Account #: [id number] Procedure:                Colonoscopy Indications:              Surveillance: Personal history of adenomatous                            polyps on last colonoscopy 5 years ago Medicines:                Monitored Anesthesia Care Procedure:                Pre-Anesthesia Assessment:                           - Prior to the procedure, a History and Physical                            was performed, and patient medications and                            allergies were reviewed. The patient's tolerance of                            previous anesthesia was also reviewed. The risks                            and benefits of the procedure and the sedation                            options and risks were discussed with the patient.                            All questions were answered, and informed consent                            was obtained. Prior Anticoagulants: The patient has                            taken Plavix (clopidogrel), last dose was 5 days                            prior to procedure. ASA Grade Assessment: III - A                            patient with severe systemic disease. After                            reviewing the risks and benefits, the patient was                            deemed in satisfactory condition to undergo the  procedure.                           After obtaining informed consent, the colonoscope                            was passed under direct vision. Throughout the                            procedure, the patient's blood pressure, pulse, and                            oxygen saturations were monitored continuously. The                            Colonoscope was introduced through the anus and                 advanced to the cecum, identified by appendiceal                            orifice and ileocecal valve. The colonoscopy was                            performed without difficulty. The patient tolerated                            the procedure well. The quality of the bowel                            preparation was good. The ileocecal valve,                            appendiceal orifice, and rectum were photographed. Scope In: 3:57:10 PM Scope Out: 4:13:01 PM Scope Withdrawal Time: 0 hours 13 minutes 35 seconds  Total Procedure Duration: 0 hours 15 minutes 51 seconds  Findings:                 The digital rectal exam was normal.                           A 4 mm polyp was found in the cecum. The polyp was                            sessile. The polyp was removed with a cold snare.                            Resection and retrieval were complete.                           The exam was otherwise without abnormality on                            direct and retroflexion views. Complications:            No immediate complications. Estimated Blood Loss:  Estimated blood loss was minimal. Impression:               - One 4 mm polyp in the cecum, removed with a cold                            snare. Resected and retrieved.                           - The examination was otherwise normal on direct                            and retroflexion views. Recommendation:           - Patient has a contact number available for                            emergencies. The signs and symptoms of potential                            delayed complications were discussed with the                            patient. Return to normal activities tomorrow.                            Written discharge instructions were provided to the                            patient.                           - Resume previous diet.                           - Continue present medications.                            - Await pathology results.                           - Repeat colonoscopy is recommended for                            surveillance. The colonoscopy date will be                            determined after pathology results from today's                            exam become available for review.                           - Resume Plavix (clopidogrel) at prior dose today.                            Refer to managing physician for further adjustment  of therapy. Jerene Bears, MD 11/15/2018 4:23:56 PM This report has been signed electronically.

## 2018-11-15 NOTE — Op Note (Signed)
[facility] Patient Name: Lindsay Rivera Procedure Date: 11/15/2018 3:39 PM MRN: [id number] Endoscopist: Jerene Bears , MD Age: 58 Referring MD:  Date of Birth: 01/18/1961 Gender: Female Account #: [id number] Procedure:                Upper GI endoscopy Indications:              Dysphagia (patient reports dilation previously                            helped this symptom); abnormal barium esophagram                            suggesting dysmotility Medicines:                Monitored Anesthesia Care Procedure:                Pre-Anesthesia Assessment:                           - Prior to the procedure, a History and Physical                            was performed, and patient medications and                            allergies were reviewed. The patient's tolerance of                            previous anesthesia was also reviewed. The risks                            and benefits of the procedure and the sedation                            options and risks were discussed with the patient.                            All questions were answered, and informed consent                            was obtained. Prior Anticoagulants: The patient has                            taken Plavix (clopidogrel), last dose was 5 days                            prior to procedure. ASA Grade Assessment: III - A                            patient with severe systemic disease. After                            reviewing the risks and benefits, the patient was  deemed in satisfactory condition to undergo the                            procedure.                           After obtaining informed consent, the endoscope was                            passed under direct vision. Throughout the                            procedure, the patient's blood pressure, pulse, and                            oxygen saturations were monitored continuously. The        Endoscope was introduced through the mouth, and                            advanced to the second part of duodenum. The upper                            GI endoscopy was accomplished without difficulty.                            The patient tolerated the procedure well. Scope In: Scope Out: Findings:                 The examined esophagus was normal.                           No endoscopic abnormality was evident in the                            esophagus to explain the patient's complaint of                            dysphagia. It was decided, however, to proceed with                            dilation of the entire esophagus. A guidewire was                            placed and the scope was withdrawn. Dilation was                            performed with a Savary dilator with mild                            resistance at 18 mm.                           A 1 cm hiatal hernia was present.  The entire examined stomach was normal.                           The examined duodenum was normal. Complications:            No immediate complications. Estimated Blood Loss:     Estimated blood loss: none. Impression:               - Normal esophagus.                           - No endoscopic esophageal abnormality to explain                            patient's dysphagia. Esophagus dilated with Savary                            to 18 mm.                           - 1 cm hiatal hernia.                           - Normal stomach.                           - Normal examined duodenum.                           - No specimens collected. Recommendation:           - Patient has a contact number available for                            emergencies. The signs and symptoms of potential                            delayed complications were discussed with the                            patient. Return to normal activities tomorrow.                            Written  discharge instructions were provided to the                            patient.                           - Resume previous diet.                           - Continue present medications.                           - Resume Plavix (clopidogrel) at prior dose today.  Refer to managing physician for further adjustment                            of therapy.                           - If trouble swallowing persists after dilation,                            then esophageal manometry is recommended. Jerene Bears, MD 11/15/2018 4:19:41 PM This report has been signed electronically.

## 2018-11-16 MED FILL — CLOPIDOGREL 75 MG TABLET: 75 | 30 days supply | Qty: 30 | Fill #4

## 2018-11-19 ENCOUNTER — Telehealth: Payer: Self-pay

## 2018-11-19 NOTE — Telephone Encounter (Signed)
  Follow up Call-  Call back number 11/15/2018  Post procedure Call Back phone  # [phone]  Permission to leave phone message Yes  Some recent data might be hidden     Patient questions:  Do you have a fever, pain , or abdominal swelling? No. Pain Score  0 *  Have you tolerated food without any problems? Yes.    Have you been able to return to your normal activities? Yes.    Do you have any questions about your discharge instructions: Diet   No. Medications  No. Follow up visit  No.  Do you have questions or concerns about your Care? No.  Actions: * If pain score is 4 or above: No action needed, pain <4.  1. Have you developed a fever since your procedure? no  2.   Have you had an respiratory symptoms (SOB or cough) since your procedure? no  3.   Have you tested positive for COVID 19 since your procedure no  4.   Have you had any family members/close contacts diagnosed with the COVID 19 since your procedure?  no   If yes to any of these questions please route to Joylene John, RN and Alphonsa Gin, Therapist, sports.

## 2018-11-20 ENCOUNTER — Telehealth: Payer: Self-pay | Admitting: Neurology

## 2018-11-20 ENCOUNTER — Encounter: Payer: Self-pay | Admitting: Neurology

## 2018-11-20 ENCOUNTER — Ambulatory Visit: Payer: Medicaid Other | Admitting: Neurology

## 2018-11-20 ENCOUNTER — Other Ambulatory Visit: Payer: Self-pay

## 2018-11-20 VITALS — BP 131/87 | HR 61 | Temp 97.9°F | Ht 62.0 in | Wt 164.5 lb

## 2018-11-20 DIAGNOSIS — M542 Cervicalgia: Secondary | ICD-10-CM

## 2018-11-20 DIAGNOSIS — R202 Paresthesia of skin: Secondary | ICD-10-CM

## 2018-11-20 DIAGNOSIS — R159 Full incontinence of feces: Secondary | ICD-10-CM | POA: Diagnosis not present
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
## 2018-11-20 NOTE — Telephone Encounter (Signed)
Medicaid order sent to GI. They will obtain the auth and reach out to the patient to schedule.  

## 2018-11-22 ENCOUNTER — Other Ambulatory Visit: Payer: Self-pay

## 2018-11-22 ENCOUNTER — Ambulatory Visit (HOSPITAL_COMMUNITY)
Admission: RE | Admit: 2018-11-22 | Discharge: 2018-11-22 | Disposition: A | Discharge status: Home / Self Care | Payer: Medicaid Other | Source: Ambulatory Visit | Attending: Family Medicine | Admitting: Family Medicine

## 2018-11-22 ENCOUNTER — Encounter (HOSPITAL_COMMUNITY): Payer: Self-pay

## 2018-11-22 ENCOUNTER — Telehealth: Payer: Self-pay | Admitting: Family Medicine

## 2018-11-22 DIAGNOSIS — R1011 Right upper quadrant pain: Secondary | ICD-10-CM | POA: Insufficient documentation

## 2018-11-22 DIAGNOSIS — R195 Other fecal abnormalities: Secondary | ICD-10-CM | POA: Insufficient documentation

## 2018-11-22 NOTE — Telephone Encounter (Addendum)
Pt came in to request another appointment at [facility] they could not draw blood from her today and needs another appointment scheduled by the referring provider. Please follow up

## 2018-11-23 ENCOUNTER — Ambulatory Visit: Payer: Medicaid Other | Admitting: Family Medicine

## 2018-11-24 ENCOUNTER — Encounter: Payer: Self-pay | Admitting: Internal Medicine

## 2018-11-26 NOTE — Telephone Encounter (Signed)
Spoke with patient and informed her with the new appt time for her HETA scan.

## 2018-11-28 MED FILL — LINZESS 290 MCG CAPSULE: 290 | 30 days supply | Qty: 30 | Fill #1

## 2018-11-28 MED FILL — ?ROSUVASTATIN CALCIUM 20MG: 20 | 90 days supply | Qty: 90 | Fill #2

## 2018-11-28 MED FILL — FLUoxetine HCL 40 MG CAPS: 40 | 30 days supply | Qty: 30 | Fill #1

## 2018-11-28 MED FILL — AMLODIPINE BESYLATE 5 MG TA: 5 | 30 days supply | Qty: 30 | Fill #4

## 2018-11-28 MED FILL — LEVOTHYROXINE 112 MCG TAB: 112 | 30 days supply | Qty: 30 | Fill #2

## 2018-11-28 MED FILL — METFORMIN HCL ER 500 MG TB2: 500 | 30 days supply | Qty: 30 | Fill #2

## 2018-12-06 ENCOUNTER — Other Ambulatory Visit: Payer: Self-pay | Admitting: Family Medicine

## 2018-12-06 DIAGNOSIS — R21 Rash and other nonspecific skin eruption: Secondary | ICD-10-CM

## 2018-12-06 MED FILL — VALACYCLOVIR HCL 500 MG TAB: 500 | 3 days supply | Qty: 6 | Fill #0

## 2018-12-07 ENCOUNTER — Ambulatory Visit: Payer: Medicaid Other | Admitting: Family Medicine

## 2018-12-12 ENCOUNTER — Ambulatory Visit (HOSPITAL_COMMUNITY)
Admission: RE | Admit: 2018-12-12 | Discharge: 2018-12-12 | Disposition: A | Discharge status: Home / Self Care | Payer: Medicaid Other | Source: Ambulatory Visit | Attending: Family Medicine | Admitting: Family Medicine

## 2018-12-12 ENCOUNTER — Other Ambulatory Visit: Payer: Self-pay

## 2018-12-12 DIAGNOSIS — E785 Hyperlipidemia, unspecified: Secondary | ICD-10-CM | POA: Insufficient documentation

## 2018-12-12 DIAGNOSIS — R195 Other fecal abnormalities: Secondary | ICD-10-CM | POA: Diagnosis not present

## 2018-12-12 DIAGNOSIS — I509 Heart failure, unspecified: Secondary | ICD-10-CM | POA: Insufficient documentation

## 2018-12-12 DIAGNOSIS — E118 Type 2 diabetes mellitus with unspecified complications: Secondary | ICD-10-CM | POA: Diagnosis not present

## 2018-12-12 DIAGNOSIS — E039 Hypothyroidism, unspecified: Secondary | ICD-10-CM | POA: Diagnosis not present

## 2018-12-12 DIAGNOSIS — R1030 Lower abdominal pain, unspecified: Secondary | ICD-10-CM | POA: Diagnosis not present

## 2018-12-12 DIAGNOSIS — R110 Nausea: Secondary | ICD-10-CM | POA: Diagnosis not present

## 2018-12-12 DIAGNOSIS — R1011 Right upper quadrant pain: Secondary | ICD-10-CM

## 2018-12-12 DIAGNOSIS — K9049 Malabsorption due to intolerance, not elsewhere classified: Secondary | ICD-10-CM | POA: Diagnosis not present

## 2018-12-12 MED ORDER — TECHNETIUM TC 99M MEBROFENIN IV KIT
5.0800 | PACK | Freq: Once | INTRAVENOUS | Status: AC | PRN
  Administered 2018-12-12: 10:00:00 5.08 via INTRAVENOUS

## 2018-12-15 ENCOUNTER — Ambulatory Visit
Admission: RE | Admit: 2018-12-15 | Discharge: 2018-12-15 | Disposition: A | Discharge status: Home / Self Care | Payer: Medicaid Other | Source: Ambulatory Visit | Attending: Neurology | Admitting: Neurology

## 2018-12-15 ENCOUNTER — Other Ambulatory Visit: Payer: Self-pay

## 2018-12-15 DIAGNOSIS — R159 Full incontinence of feces: Secondary | ICD-10-CM

## 2018-12-15 DIAGNOSIS — M542 Cervicalgia: Secondary | ICD-10-CM

## 2018-12-15 DIAGNOSIS — R202 Paresthesia of skin: Secondary | ICD-10-CM | POA: Diagnosis not present

## 2018-12-17 ENCOUNTER — Telehealth: Payer: Self-pay | Admitting: Neurology

## 2018-12-17 NOTE — Telephone Encounter (Signed)
MRI of cervical spine showed mild degenerative changes, there was no significant canal or foraminal narrowing.   IMPRESSION: This MRI of the cervical spine without contrast shows the following: 1.   The spinal cord appears normal. 2.    At C5-C6, C6-C7 and C7-T1 there is mild spinal stenosis.  At C5-C6, there is moderate left greater than right foraminal narrowing and at C7-T1 there is moderate right foraminal narrowing but there does not appear to be nerve root compression at either level.

## 2018-12-17 NOTE — Telephone Encounter (Signed)
I spoke to the patient and provided her with the results below.  She will keep her pending appt, on 01/16/2019,  for NCV/EMG.

## 2018-12-27 ENCOUNTER — Ambulatory Visit: Payer: Medicaid Other | Admitting: Internal Medicine

## 2018-12-28 ENCOUNTER — Other Ambulatory Visit: Payer: Self-pay

## 2018-12-28 ENCOUNTER — Other Ambulatory Visit: Payer: Self-pay | Admitting: Family Medicine

## 2018-12-28 ENCOUNTER — Encounter: Payer: Self-pay | Admitting: Family Medicine

## 2018-12-28 ENCOUNTER — Ambulatory Visit: Payer: Medicaid Other | Attending: Internal Medicine | Admitting: Family Medicine

## 2018-12-28 DIAGNOSIS — E089 Diabetes mellitus due to underlying condition without complications: Secondary | ICD-10-CM

## 2018-12-28 DIAGNOSIS — R140 Abdominal distension (gaseous): Secondary | ICD-10-CM | POA: Diagnosis not present

## 2018-12-28 DIAGNOSIS — R1030 Lower abdominal pain, unspecified: Secondary | ICD-10-CM

## 2018-12-28 DIAGNOSIS — B009 Herpesviral infection, unspecified: Secondary | ICD-10-CM

## 2018-12-28 DIAGNOSIS — R350 Frequency of micturition: Secondary | ICD-10-CM | POA: Diagnosis not present

## 2018-12-28 DIAGNOSIS — E039 Hypothyroidism, unspecified: Secondary | ICD-10-CM

## 2018-12-28 DIAGNOSIS — M7989 Other specified soft tissue disorders: Secondary | ICD-10-CM

## 2018-12-28 DIAGNOSIS — H5713 Ocular pain, bilateral: Secondary | ICD-10-CM

## 2018-12-28 MED ORDER — VALACYCLOVIR HCL 500 MG PO TABS: ORAL_TABLET | ORAL | 11 refills | Status: DC

## 2018-12-28 MED ORDER — SULFAMETHOXAZOLE-TRIMETHOPRIM 800-160 MG PO TABS: 1.0000 | ORAL_TABLET | Freq: Two times a day (BID) | ORAL | 0 refills | Status: DC

## 2018-12-28 MED FILL — FLUoxetine HCL 40 MG CAPS: 40 | 30 days supply | Qty: 30 | Fill #2

## 2018-12-28 MED FILL — CARVEDILOL 3.125 MG TABLET: 3.125 | 30 days supply | Qty: 60 | Fill #4

## 2018-12-28 MED FILL — LEVOTHYROXINE 112 MCG TAB: 112 | 30 days supply | Qty: 30 | Fill #0

## 2018-12-28 MED FILL — LINZESS 290 MCG CAPSULE: 290 | 30 days supply | Qty: 30 | Fill #2

## 2018-12-28 MED FILL — PANTOPRAZOLE SOD DR 40 MG T: 40 | 30 days supply | Qty: 60 | Fill #1

## 2018-12-28 MED FILL — QUETIAPINE FUMARATE 300 MG: 300 | 30 days supply | Qty: 30 | Fill #1

## 2018-12-28 MED FILL — METFORMIN HCL ER 500 MG TB2: 500 | 30 days supply | Qty: 30 | Fill #0

## 2018-12-28 MED FILL — SULFAMETHOXAZOLE-TMP DS TAB: 800-160 | 3 days supply | Qty: 6 | Fill #0

## 2018-12-28 MED FILL — AMLODIPINE BESYLATE 5 MG TA: 5 | 30 days supply | Qty: 30 | Fill #5

## 2018-12-28 MED FILL — POTASSIUM CHLORIDE ER 10 ME: 10 | 30 days supply | Qty: 60 | Fill #1

## 2018-12-28 MED FILL — VALACYCLOVIR HCL 500 MG TAB: 500 | 3 days supply | Qty: 6 | Fill #0

## 2018-12-28 MED FILL — CLOPIDOGREL 75 MG TABLET: 75 | 30 days supply | Qty: 30 | Fill #5

## 2018-12-28 NOTE — Progress Notes (Signed)
Patient verified DOB Patient has taken medication today. Patient has  Patient complains of a knot that came up yesterday on left upper arm. Patient states the abdominal pain is manageable at this time. Patient states the stomach is still bloated.
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
## 2019-01-16 ENCOUNTER — Encounter: Payer: Medicaid Other | Admitting: Neurology

## 2019-02-06 ENCOUNTER — Other Ambulatory Visit: Payer: Self-pay | Admitting: Family Medicine

## 2019-02-06 DIAGNOSIS — R768 Other specified abnormal immunological findings in serum: Secondary | ICD-10-CM

## 2019-02-06 MED FILL — CARVEDILOL 3.125 MG TABLET: 3.125 | 30 days supply | Qty: 60 | Fill #5

## 2019-02-06 MED FILL — PANTOPRAZOLE SOD DR 40 MG T: 40 | 30 days supply | Qty: 60 | Fill #0

## 2019-02-06 MED FILL — LEVOTHYROXINE 112 MCG TAB: 112 | 30 days supply | Qty: 30 | Fill #1

## 2019-02-06 MED FILL — metFORMIN HCL ER 500 MG TB2: 500 | 30 days supply | Qty: 30 | Fill #1

## 2019-02-06 MED FILL — QUETIAPINE FUMARATE 300 MG: 300 | 30 days supply | Qty: 30 | Fill #2

## 2019-02-06 MED FILL — FLUoxetine HCL 40 MG CAPS: 40 | 30 days supply | Qty: 30 | Fill #0

## 2019-02-06 MED FILL — CLOPIDOGREL 75 MG TABLET: 75 | 30 days supply | Qty: 30 | Fill #6

## 2019-02-06 MED FILL — POTASSIUM CHLORIDE ER 10 ME: 10 | 30 days supply | Qty: 60 | Fill #2

## 2019-02-06 MED FILL — LINZESS 290 MCG CAPSULE: 290 | 30 days supply | Qty: 30 | Fill #3

## 2019-02-06 MED FILL — VALACYCLOVIR HCL 500 MG TAB: 500 | 30 days supply | Qty: 60 | Fill #1

## 2019-02-06 MED FILL — AMLODIPINE BESYLATE 5 MG TA: 5 | 30 days supply | Qty: 30 | Fill #6

## 2019-02-13 LAB — HM DIABETES EYE EXAM

## 2019-02-23 ENCOUNTER — Encounter (HOSPITAL_COMMUNITY): Payer: Self-pay | Admitting: Emergency Medicine

## 2019-02-23 ENCOUNTER — Emergency Department (HOSPITAL_COMMUNITY)
Admission: EM | Admit: 2019-02-23 | Discharge: 2019-02-24 | Disposition: A | Discharge status: Home / Self Care | Payer: Medicaid Other | Attending: Emergency Medicine | Admitting: Emergency Medicine

## 2019-02-23 ENCOUNTER — Other Ambulatory Visit: Payer: Self-pay

## 2019-02-23 ENCOUNTER — Emergency Department (HOSPITAL_COMMUNITY): Payer: Medicaid Other

## 2019-02-23 DIAGNOSIS — R0602 Shortness of breath: Secondary | ICD-10-CM | POA: Diagnosis not present

## 2019-02-23 DIAGNOSIS — R438 Other disturbances of smell and taste: Secondary | ICD-10-CM | POA: Insufficient documentation

## 2019-02-23 DIAGNOSIS — R0789 Other chest pain: Secondary | ICD-10-CM | POA: Diagnosis present

## 2019-02-23 DIAGNOSIS — R05 Cough: Secondary | ICD-10-CM | POA: Insufficient documentation

## 2019-02-23 DIAGNOSIS — Z5321 Procedure and treatment not carried out due to patient leaving prior to being seen by health care provider: Secondary | ICD-10-CM | POA: Diagnosis not present

## 2019-02-23 LAB — CBC
HCT: 42.7 % (ref 36.0–46.0)
Hemoglobin: 13.6 g/dL (ref 12.0–15.0)
MCH: 27.9 pg (ref 26.0–34.0)
MCHC: 31.9 g/dL (ref 30.0–36.0)
MCV: 87.5 fL (ref 80.0–100.0)
Platelets: 416 10*3/uL — ABNORMAL HIGH (ref 150–400)
RBC: 4.88 MIL/uL (ref 3.87–5.11)
RDW: 16.6 % — ABNORMAL HIGH (ref 11.5–15.5)
WBC: 10.6 10*3/uL — ABNORMAL HIGH (ref 4.0–10.5)
nRBC: 0 % (ref 0.0–0.2)

## 2019-02-23 LAB — TROPONIN I (HIGH SENSITIVITY)
Troponin I (High Sensitivity): <2 ng/L (ref <18)
Troponin I (High Sensitivity): <2 ng/L (ref <18)

## 2019-02-23 LAB — BASIC METABOLIC PANEL
Anion gap: 12 (ref 5–15)
BUN: 7 mg/dL (ref 6–20)
CO2: 21 mmol/L — ABNORMAL LOW (ref 22–32)
Calcium: 9.1 mg/dL (ref 8.9–10.3)
Chloride: 107 mmol/L (ref 98–111)
Creatinine, Ser: 0.74 mg/dL (ref 0.44–1.00)
GFR calc Af Amer: >60 mL/min (ref >60)
GFR calc non Af Amer: >60 mL/min (ref >60)
Glucose, Bld: 86 mg/dL (ref 70–99)
Potassium: 3.7 mmol/L (ref 3.5–5.1)
Sodium: 140 mmol/L (ref 135–145)

## 2019-02-23 MED ORDER — SODIUM CHLORIDE 0.9% FLUSH: 3.0000 mL | Freq: Once | INTRAVENOUS | Status: DC

## 2019-02-23 NOTE — ED Triage Notes (Signed)
Pt presents with L chest tightness, shob, inability to taste or smell x 1 week. Cough x 2 weeks.  CABG  14yrs ago.

## 2019-02-24 NOTE — ED Notes (Signed)
Pt called for vitals recheck no answer in the lobby 

## 2019-02-24 NOTE — ED Notes (Signed)
Pt called for vitals recheck but no answer in the lobby 

## 2019-03-13 ENCOUNTER — Encounter: Payer: Medicaid Other | Admitting: Neurology

## 2019-03-26 MED FILL — LEVOTHYROXINE 112 MCG TAB: 112 | 30 days supply | Qty: 30 | Fill #2

## 2019-03-26 MED FILL — POTASSIUM CHLORIDE ER 10 ME: 10 | 30 days supply | Qty: 60 | Fill #3

## 2019-03-26 MED FILL — CARVEDILOL 3.125 MG TABLET: 3.125 | 30 days supply | Qty: 60 | Fill #6

## 2019-03-26 MED FILL — metFORMIN HCL ER 500 MG TB2: 500 | 30 days supply | Qty: 30 | Fill #2

## 2019-03-26 MED FILL — VALACYCLOVIR HCL 500 MG TAB: 500 | 3 days supply | Qty: 6 | Fill #2

## 2019-03-26 MED FILL — CLOPIDOGREL 75 MG TABLET: 75 | 30 days supply | Qty: 30 | Fill #7

## 2019-03-26 MED FILL — PANTOPRAZOLE SOD DR 40 MG T: 40 | 30 days supply | Qty: 60 | Fill #1

## 2019-03-26 MED FILL — AMLODIPINE BESYLATE 5 MG TA: 5 | 30 days supply | Qty: 30 | Fill #7

## 2019-04-17 ENCOUNTER — Telehealth: Payer: Self-pay | Admitting: *Deleted

## 2019-04-17 ENCOUNTER — Encounter: Payer: Medicaid Other | Admitting: Neurology

## 2019-04-17 NOTE — Telephone Encounter (Signed)
Pt called same day to cancel NCV/EMG due to toothache.

## 2019-05-15 ENCOUNTER — Encounter: Payer: Medicaid Other | Admitting: Neurology

## 2019-10-07 ENCOUNTER — Telehealth: Payer: Self-pay | Admitting: Family Medicine

## 2019-10-07 NOTE — Telephone Encounter (Signed)
Patient requested for all medications to be refilled and sent to [facility], [place], [place] [address]. amLODipine (NORVASC) 5 MG tablet [[id number]]  carvedilol (COREG) 3.125 MG tablet [[id number]] clopidogrel (PLAVIX) 75 MG tablet [[id number]]  levothyroxine (SYNTHROID) 112 MCG tablet [[id number]]  linaclotide (LINZESS) 290 MCG CAPS capsule [[id number]]  pantoprazole (PROTONIX) 40 MG tablet [[id number]]  potassium chloride (K-DUR) 10 MEQ tablet [[id number]]  QUEtiapine (SEROQUEL) 300 MG tablet [[id number]]  rosuvastatin (CRESTOR) 20 MG tablet [[id number]] valACYclovir (VALTREX) 500 MG tablet [[id number]]  TRUEPLUS LANCETS 28G MISC [[id number]]  sulfamethoxazole-trimethoprim (BACTRIM DS) 800-160 MG tablet [[id number]]  nitroGLYCERIN (NITROSTAT) 0.4 MG SL tablet [[id number]]  metFORMIN (GLUCOPHAGE-XR) 500 MG 24 hr tablet [[id number]]  glucose blood (TRUE METRIX BLOOD GLUCOSE TEST) test strip [[id number]]  fluticasone (FLONASE) 50 MCG/ACT nasal spray [[id number]]  FLUoxetine (PROZAC) 20 MG tablet [[id number]]  cetirizine (ZYRTEC) 5 MG tablet [[id number]]    FYI patient has an appointment 10/08/2019 w/ pcp

## 2019-10-08 ENCOUNTER — Ambulatory Visit: Payer: Medicaid Other | Admitting: Internal Medicine

## 2019-10-08 NOTE — Telephone Encounter (Signed)
Pt is being seen today by Dr. Wynetta Emery. Will defer to her.
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
## 2019-10-09 ENCOUNTER — Encounter: Payer: Medicaid Other | Admitting: Family Medicine

## 2019-10-10 ENCOUNTER — Encounter: Payer: Self-pay | Admitting: Cardiovascular Disease
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
## 2019-10-11 ENCOUNTER — Encounter: Payer: Self-pay | Admitting: Cardiovascular Disease

## 2019-10-11 ENCOUNTER — Ambulatory Visit (INDEPENDENT_AMBULATORY_CARE_PROVIDER_SITE_OTHER): Payer: Medicare Other | Admitting: Cardiovascular Disease

## 2019-10-11 ENCOUNTER — Telehealth: Payer: Self-pay | Admitting: Family Medicine

## 2019-10-11 ENCOUNTER — Other Ambulatory Visit: Payer: Self-pay

## 2019-10-11 DIAGNOSIS — I10 Essential (primary) hypertension: Secondary | ICD-10-CM

## 2019-10-11 DIAGNOSIS — I25700 Atherosclerosis of coronary artery bypass graft(s), unspecified, with unstable angina pectoris: Secondary | ICD-10-CM

## 2019-10-11 DIAGNOSIS — E089 Diabetes mellitus due to underlying condition without complications: Secondary | ICD-10-CM

## 2019-10-11 DIAGNOSIS — H5713 Ocular pain, bilateral: Secondary | ICD-10-CM | POA: Diagnosis not present

## 2019-10-11 DIAGNOSIS — R0981 Nasal congestion: Secondary | ICD-10-CM

## 2019-10-11 DIAGNOSIS — E039 Hypothyroidism, unspecified: Secondary | ICD-10-CM | POA: Diagnosis not present

## 2019-10-11 DIAGNOSIS — R768 Other specified abnormal immunological findings in serum: Secondary | ICD-10-CM

## 2019-10-11 DIAGNOSIS — R7689 Other specified abnormal immunological findings in serum: Secondary | ICD-10-CM

## 2019-10-11 DIAGNOSIS — J309 Allergic rhinitis, unspecified: Secondary | ICD-10-CM

## 2019-10-11 DIAGNOSIS — B009 Herpesviral infection, unspecified: Secondary | ICD-10-CM

## 2019-10-11 DIAGNOSIS — J069 Acute upper respiratory infection, unspecified: Secondary | ICD-10-CM

## 2019-10-11 LAB — LIPID PANEL
Chol/HDL Ratio: 4.7 ratio — ABNORMAL HIGH (ref 0.0–4.4)
Cholesterol, Total: 275 mg/dL — ABNORMAL HIGH (ref 100–199)
HDL: 58 mg/dL (ref >39)
LDL Chol Calc (NIH): 196 mg/dL — ABNORMAL HIGH (ref 0–99)
Triglycerides: 118 mg/dL (ref 0–149)
VLDL Cholesterol Cal: 21 mg/dL (ref 5–40)

## 2019-10-11 LAB — BASIC METABOLIC PANEL
BUN/Creatinine Ratio: 8 — ABNORMAL LOW (ref 9–23)
BUN: 8 mg/dL (ref 6–24)
CO2: 23 mmol/L (ref 20–29)
Calcium: 9.5 mg/dL (ref 8.7–10.2)
Chloride: 105 mmol/L (ref 96–106)
Creatinine, Ser: 0.96 mg/dL (ref 0.57–1.00)
GFR calc Af Amer: 75 mL/min/{1.73_m2} (ref >59)
GFR calc non Af Amer: 65 mL/min/{1.73_m2} (ref >59)
Glucose: 98 mg/dL (ref 65–99)
Potassium: 4.4 mmol/L (ref 3.5–5.2)
Sodium: 142 mmol/L (ref 134–144)

## 2019-10-11 LAB — HEPATIC FUNCTION PANEL
ALT: 8 IU/L (ref 0–32)
AST: 14 IU/L (ref 0–40)
Albumin: 4.1 g/dL (ref 3.8–4.9)
Alkaline Phosphatase: 96 IU/L (ref 48–121)
Bilirubin Total: 0.3 mg/dL (ref 0.0–1.2)
Bilirubin, Direct: 0.09 mg/dL (ref 0.00–0.40)
Total Protein: 7.2 g/dL (ref 6.0–8.5)

## 2019-10-11 MED ORDER — PANTOPRAZOLE SODIUM 40 MG PO TBEC: 40.0000 mg | DELAYED_RELEASE_TABLET | Freq: Two times a day (BID) | ORAL | 0 refills | Status: DC

## 2019-10-11 MED ORDER — NITROGLYCERIN 0.4 MG SL SUBL: SUBLINGUAL_TABLET | SUBLINGUAL | 0 refills | Status: DC

## 2019-10-11 MED ORDER — QUETIAPINE FUMARATE 300 MG PO TABS: 300.0000 mg | ORAL_TABLET | Freq: Every day | ORAL | 0 refills | Status: DC

## 2019-10-11 MED ORDER — CLOPIDOGREL BISULFATE 75 MG PO TABS: 75.0000 mg | ORAL_TABLET | Freq: Every day | ORAL | 3 refills | Status: DC

## 2019-10-11 MED ORDER — FLUOXETINE HCL 20 MG PO TABS: 40.0000 mg | ORAL_TABLET | Freq: Every day | ORAL | 0 refills | Status: DC

## 2019-10-11 MED ORDER — CARVEDILOL 3.125 MG PO TABS: 3.1250 mg | ORAL_TABLET | Freq: Two times a day (BID) | ORAL | 3 refills | Status: DC

## 2019-10-11 MED ORDER — FLUTICASONE PROPIONATE 50 MCG/ACT NA SUSP: 2.0000 | Freq: Every day | NASAL | 0 refills | Status: DC

## 2019-10-11 MED ORDER — CETIRIZINE HCL 5 MG PO TABS: 5.0000 mg | ORAL_TABLET | Freq: Every day | ORAL | 0 refills | Status: DC

## 2019-10-11 MED ORDER — VALACYCLOVIR HCL 500 MG PO TABS: ORAL_TABLET | ORAL | 0 refills | Status: DC

## 2019-10-11 MED ORDER — LEVOTHYROXINE SODIUM 112 MCG PO TABS: 112.0000 ug | ORAL_TABLET | Freq: Every day | ORAL | 0 refills | Status: DC

## 2019-10-11 MED ORDER — TRUE METRIX BLOOD GLUCOSE TEST VI STRP: ORAL_STRIP | 0 refills | Status: DC

## 2019-10-11 MED ORDER — ROSUVASTATIN CALCIUM 20 MG PO TABS: 20.0000 mg | ORAL_TABLET | Freq: Every day | ORAL | 3 refills | Status: DC

## 2019-10-11 MED ORDER — POTASSIUM CHLORIDE ER 10 MEQ PO TBCR: 20.0000 meq | EXTENDED_RELEASE_TABLET | Freq: Every day | ORAL | 0 refills | Status: DC

## 2019-10-11 MED ORDER — LINACLOTIDE 290 MCG PO CAPS: 290.0000 ug | ORAL_CAPSULE | Freq: Every day | ORAL | 0 refills | Status: DC

## 2019-10-11 MED ORDER — METFORMIN HCL ER 500 MG PO TB24: 500.0000 mg | ORAL_TABLET | Freq: Every day | ORAL | 0 refills | Status: DC

## 2019-10-11 NOTE — Telephone Encounter (Signed)
Refills were sent previously.

## 2019-10-11 NOTE — Patient Instructions (Signed)
Medication Instructions:  See back sheet for updated medication list. Your Synthroid has been called in for 1 month. Please contact Dr. Chapman Fitch for further refills. *If you need a refill on your cardiac medications before your next appointment, please call your pharmacy*  Lab Work: TODAY! BMET, liver, lipids If you have labs (blood work) drawn today and your tests are completely normal, you will receive your results only by: Marland Kitchen MyChart Message (if you have MyChart) OR . A paper copy in the mail If you have any lab test that is abnormal or we need to change your treatment, we will call you to review the results.  Follow-Up: At [facility], you and your health needs are our priority.  As part of our continuing mission to provide you with exceptional heart care, we have created designated Provider Care Teams.  These Care Teams include your primary Cardiologist (physician) and Advanced Practice Providers (APPs -  Physician Assistants and Nurse Practitioners) who all work together to provide you with the care you need, when you need it. Your next appointment:   6 month(s) The format for your next appointment:   In Person Provider:    Richardson Dopp, PA-C  Robbie Lis, PA-C

## 2019-10-11 NOTE — Telephone Encounter (Signed)
Pt calling upset that her medicine is not at the pharmacy. Pt states she is at her heart doctor and she is coming to the office now to speak with someone. Pt is very upset.

## 2019-10-11 NOTE — Telephone Encounter (Addendum)
Some of these were sent by her Cardiologist already. I have refilled the others for thirty day supplies.

## 2019-10-15 ENCOUNTER — Telehealth: Payer: Self-pay

## 2019-10-15 NOTE — Telephone Encounter (Signed)
Reviewed results with patient who verbalized understanding.   She got her prescriptions over the weekend and has restarted her medications.  She understands not to take potassium and it is being removed from her med list. She was grateful for assistance and agrees with treatment plan.

## 2019-10-15 NOTE — Telephone Encounter (Signed)
-----   Message from Thayer Headings, MD sent at 10/11/2019  5:04 PM EDT ----- Lipid levels are elevated.  This is not surprising because she has been off her medication for months.  Electrolytes including sodium and potassium are within normal limits.  We do not need to restart her potassium supplementation back.

## 2019-10-18 ENCOUNTER — Ambulatory Visit: Payer: Medicare Other | Attending: Family Medicine | Admitting: Licensed Clinical Social Worker

## 2019-10-18 ENCOUNTER — Encounter: Payer: Self-pay | Admitting: Family Medicine

## 2019-10-18 ENCOUNTER — Telehealth: Payer: Self-pay

## 2019-10-18 ENCOUNTER — Ambulatory Visit: Payer: Medicare Other | Admitting: Family Medicine

## 2019-10-18 ENCOUNTER — Ambulatory Visit: Payer: Medicare Other | Attending: Family Medicine | Admitting: Family Medicine

## 2019-10-18 ENCOUNTER — Other Ambulatory Visit: Payer: Self-pay

## 2019-10-18 VITALS — BP 169/109 | HR 70 | Ht 62.0 in | Wt 143.0 lb

## 2019-10-18 DIAGNOSIS — Z598 Other problems related to housing and economic circumstances: Secondary | ICD-10-CM

## 2019-10-18 DIAGNOSIS — F3130 Bipolar disorder, current episode depressed, mild or moderate severity, unspecified: Secondary | ICD-10-CM

## 2019-10-18 DIAGNOSIS — Z1231 Encounter for screening mammogram for malignant neoplasm of breast: Secondary | ICD-10-CM

## 2019-10-18 DIAGNOSIS — R05 Cough: Secondary | ICD-10-CM

## 2019-10-18 DIAGNOSIS — B009 Herpesviral infection, unspecified: Secondary | ICD-10-CM

## 2019-10-18 DIAGNOSIS — E089 Diabetes mellitus due to underlying condition without complications: Secondary | ICD-10-CM

## 2019-10-18 DIAGNOSIS — I25700 Atherosclerosis of coronary artery bypass graft(s), unspecified, with unstable angina pectoris: Secondary | ICD-10-CM

## 2019-10-18 DIAGNOSIS — I10 Essential (primary) hypertension: Secondary | ICD-10-CM

## 2019-10-18 DIAGNOSIS — J439 Emphysema, unspecified: Secondary | ICD-10-CM

## 2019-10-18 DIAGNOSIS — R058 Other specified cough: Secondary | ICD-10-CM

## 2019-10-18 DIAGNOSIS — E039 Hypothyroidism, unspecified: Secondary | ICD-10-CM

## 2019-10-18 DIAGNOSIS — R296 Repeated falls: Secondary | ICD-10-CM

## 2019-10-18 DIAGNOSIS — Z5989 Other problems related to housing and economic circumstances: Secondary | ICD-10-CM

## 2019-10-18 DIAGNOSIS — K219 Gastro-esophageal reflux disease without esophagitis: Secondary | ICD-10-CM

## 2019-10-18 DIAGNOSIS — Z789 Other specified health status: Secondary | ICD-10-CM

## 2019-10-18 DIAGNOSIS — J309 Allergic rhinitis, unspecified: Secondary | ICD-10-CM

## 2019-10-18 LAB — POCT GLYCOSYLATED HEMOGLOBIN (HGB A1C): HbA1c, POC (controlled diabetic range): 5.6 % (ref 0.0–7.0)

## 2019-10-18 LAB — GLUCOSE, POCT (MANUAL RESULT ENTRY): POC Glucose: 102 mg/dL — AB (ref 70–99)

## 2019-10-18 IMAGING — CT CT ANGIO CHEST
2 of 6 series · 19 of 36 positions shown · IV contrast (iopamidol)
Comparison: 04/19/2015

CLINICAL DATA: Chest pain, elevated D-dimer

EXAM:
CT ANGIOGRAPHY CHEST WITH CONTRAST
TECHNIQUE: Multidetector CT imaging of the chest was performed using the
standard protocol during bolus administration of intravenous
contrast. Multiplanar CT image reconstructions and MIPs were
obtained to evaluate the vascular anatomy.
CONTRAST:  55 mL G3KPAE-HJ1 IOPAMIDOL (G3KPAE-HJ1) INJECTION 76%

[Series 7: pe thins · axial · 0.71mm/px · z∈[-372,-146]mm · 18 of 359 slices shown]
[im 18/359  lung]
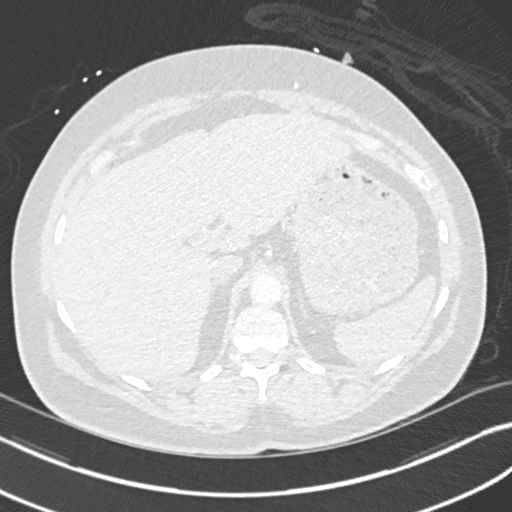
[im 36/359  mediastinal]
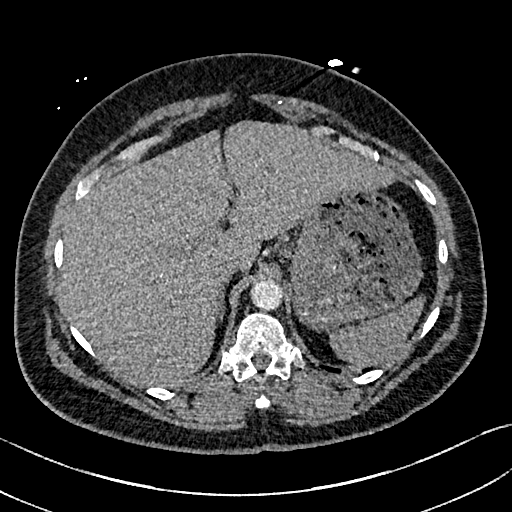
[im 54/359  lung]
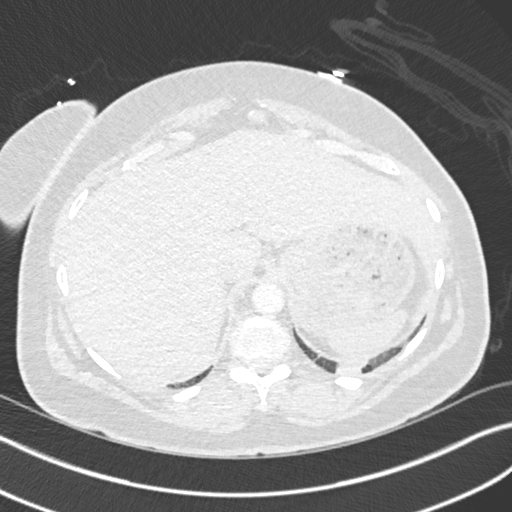
[im 72/359  mediastinal]
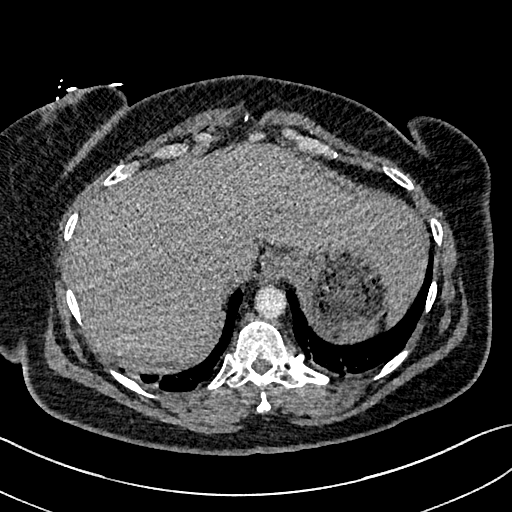
[im 90/359  lung]
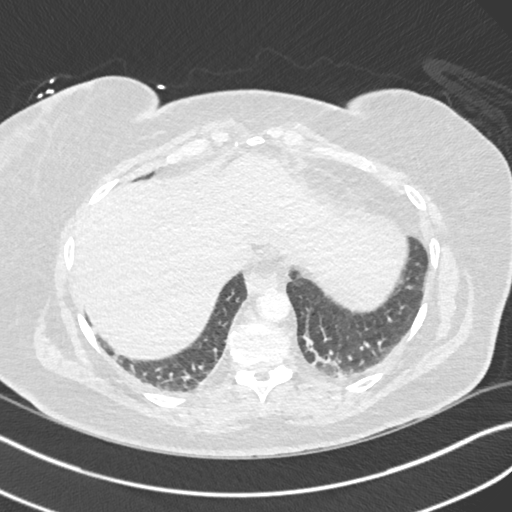
[im 108/359  mediastinal]
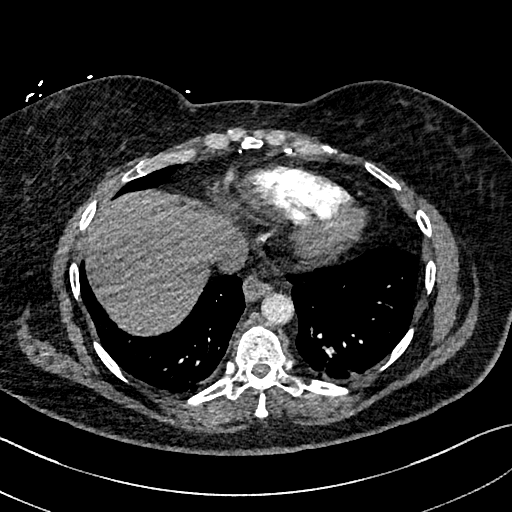
[im 126/359  lung]
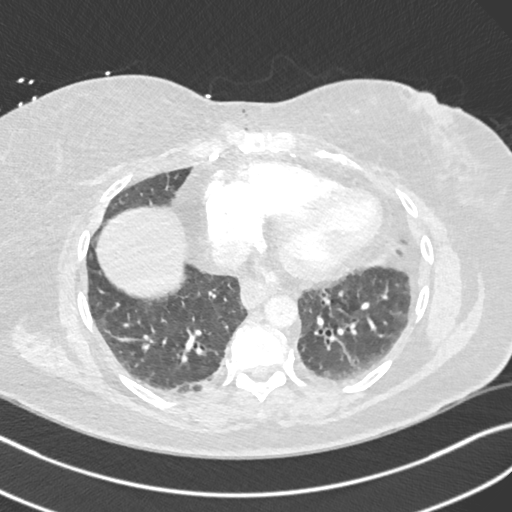
[im 144/359  mediastinal]
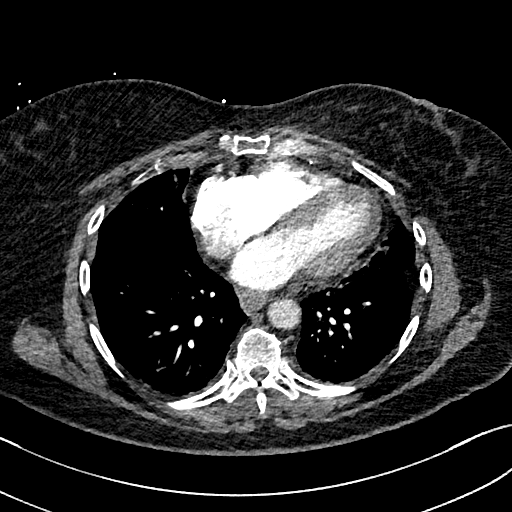
[im 162/359  lung]
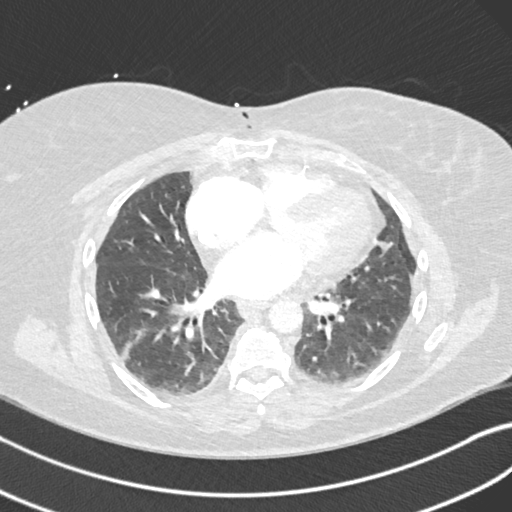
[im 197/359  mediastinal]
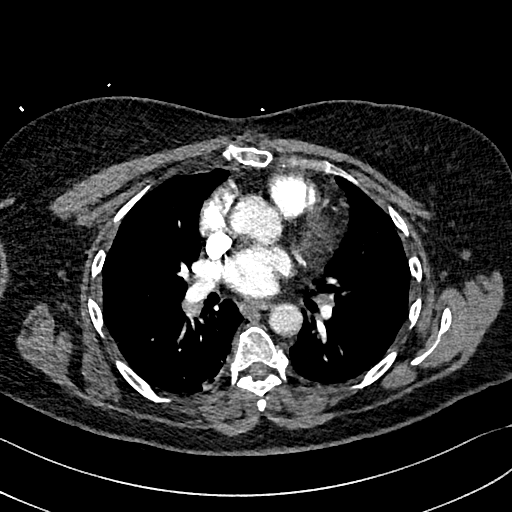
[im 215/359  lung]
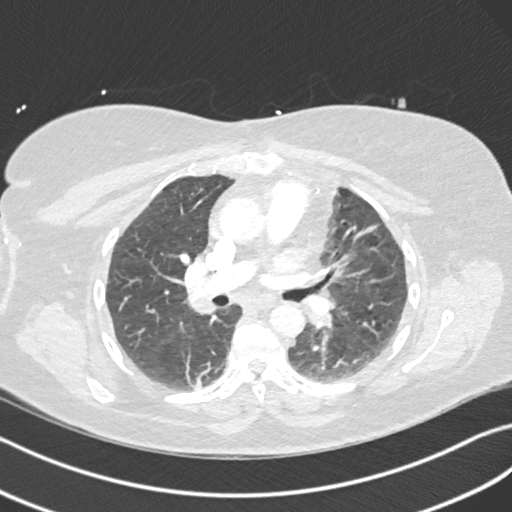
[im 233/359  mediastinal]
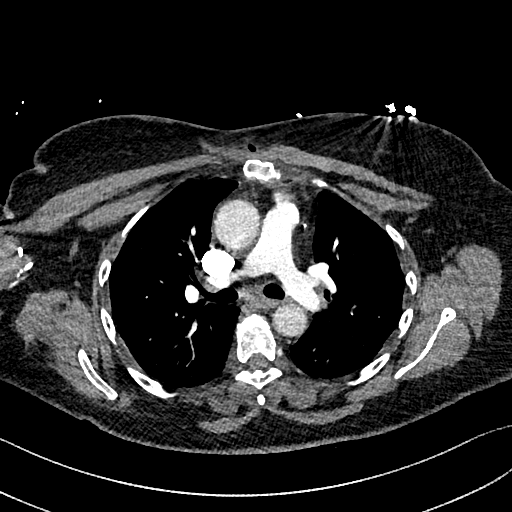
[im 251/359  lung]
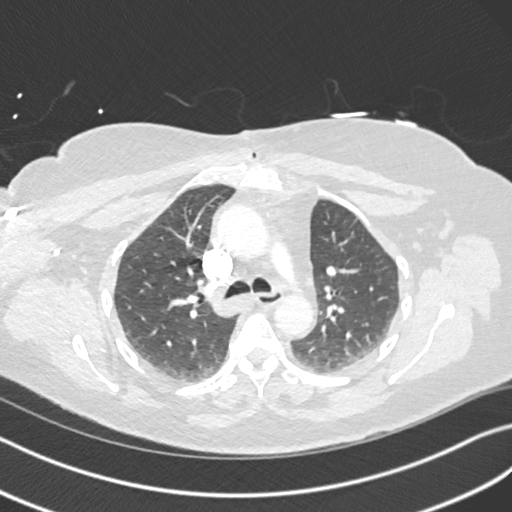
[im 269/359  mediastinal]
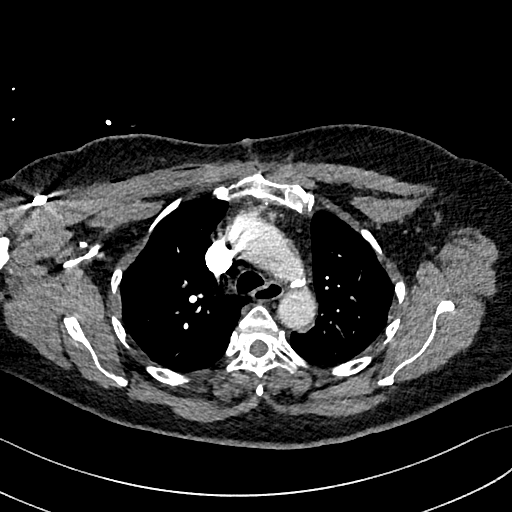
[im 287/359  lung]
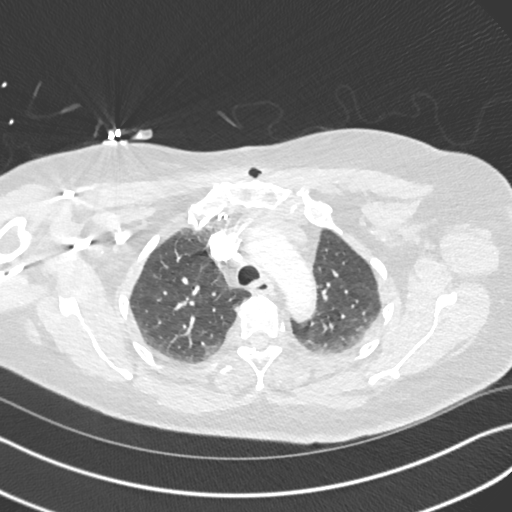
[im 305/359  mediastinal]
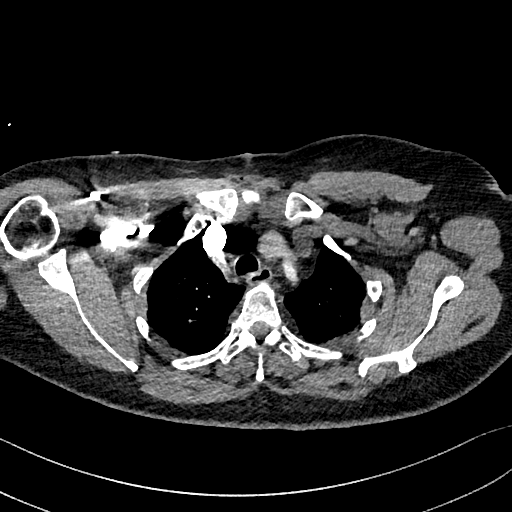
[im 323/359  lung]
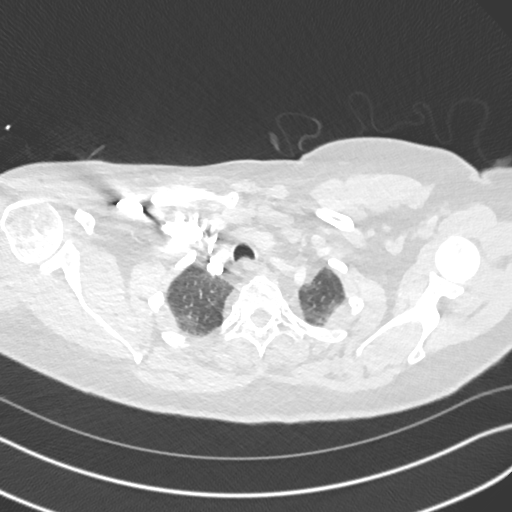
[im 341/359  mediastinal]
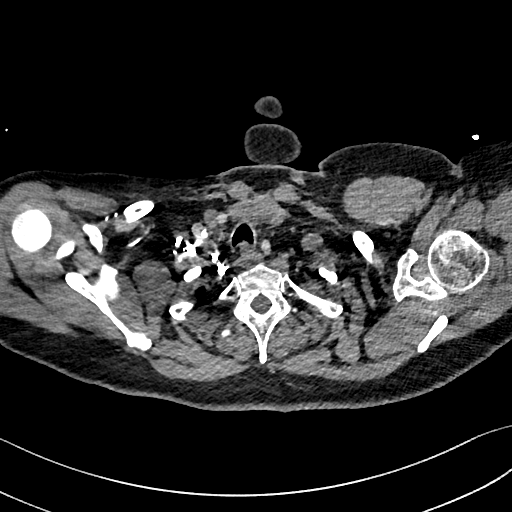

[Series 8: pe 2mm cor · coronal · 0.59mm/px · 1 of 151 slices shown]
[im 76/151  mediastinal]
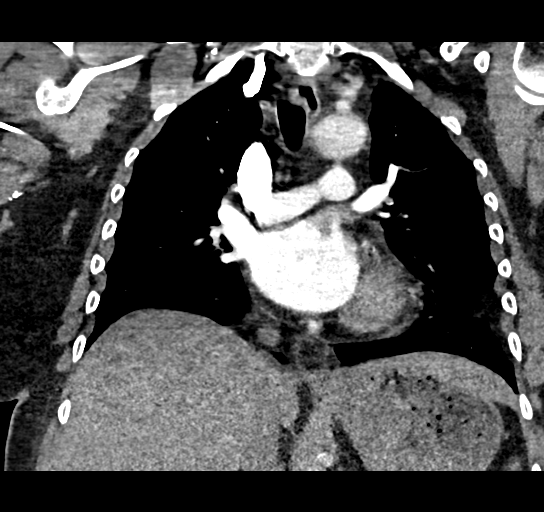

[19 of 36 positions shown; findings below may reference images not displayed]

FINDINGS: Cardiovascular: Satisfactory opacification of the pulmonary arteries
to the segmental level. No evidence of pulmonary embolism. Normal
heart size. No pericardial effusion.

Mediastinum/Nodes: No enlarged mediastinal or axillary lymph nodes.
Stable 12 mm right hilar lymph node. Thyroid gland, trachea, and
esophagus demonstrate no significant findings.

Lungs/Pleura: Mild bibasilar atelectasis. No focal consolidation,
pleural effusion or pneumothorax. 4 mm left upper lobe pulmonary
nodule.

Upper Abdomen: No acute abnormality.

Musculoskeletal: No chest wall abnormality. No acute or significant
osseous findings. Soft tissue emphysema and haziness in the
subcutaneous fat anterior to the sternum consistent with recent
sternal wire removal.

Review of the MIP images confirms the above findings.
IMPRESSION: 1. No evidence pulmonary embolus.
2. Postsurgical changes in the anterior presternal soft tissues from
recent sternal wire removal.

## 2019-10-18 MED ORDER — FLUOXETINE HCL 20 MG PO TABS: 40.0000 mg | ORAL_TABLET | Freq: Every day | ORAL | 0 refills | Status: DC

## 2019-10-18 MED ORDER — CARVEDILOL 3.125 MG PO TABS: 3.1250 mg | ORAL_TABLET | Freq: Two times a day (BID) | ORAL | 3 refills | Status: DC

## 2019-10-18 MED ORDER — CETIRIZINE HCL 5 MG PO TABS: 5.0000 mg | ORAL_TABLET | Freq: Every day | ORAL | 0 refills | Status: DC

## 2019-10-18 MED ORDER — ROSUVASTATIN CALCIUM 20 MG PO TABS: 20.0000 mg | ORAL_TABLET | Freq: Every day | ORAL | 3 refills | Status: DC

## 2019-10-18 MED ORDER — QUETIAPINE FUMARATE 300 MG PO TABS: 300.0000 mg | ORAL_TABLET | Freq: Every day | ORAL | 0 refills | Status: DC

## 2019-10-18 MED ORDER — PANTOPRAZOLE SODIUM 40 MG PO TBEC: 40.0000 mg | DELAYED_RELEASE_TABLET | Freq: Two times a day (BID) | ORAL | 0 refills | Status: DC

## 2019-10-18 MED ORDER — ALBUTEROL SULFATE HFA 108 (90 BASE) MCG/ACT IN AERS: INHALATION_SPRAY | RESPIRATORY_TRACT | 11 refills | Status: DC

## 2019-10-18 MED ORDER — METFORMIN HCL ER 500 MG PO TB24: 500.0000 mg | ORAL_TABLET | Freq: Every day | ORAL | 0 refills | Status: DC

## 2019-10-18 MED ORDER — CLOPIDOGREL BISULFATE 75 MG PO TABS: 75.0000 mg | ORAL_TABLET | Freq: Every day | ORAL | 3 refills | Status: DC

## 2019-10-18 MED ORDER — VALACYCLOVIR HCL 500 MG PO TABS: ORAL_TABLET | ORAL | 0 refills | Status: DC

## 2019-10-18 MED ORDER — LEVOTHYROXINE SODIUM 112 MCG PO TABS: 112.0000 ug | ORAL_TABLET | Freq: Every day | ORAL | 0 refills | Status: DC

## 2019-10-18 NOTE — Telephone Encounter (Signed)
Met with the patient when she was in the clinic today.  She was very upset and crying.  Lindsay See, LCSW also present.  The patient explained that she has been out of her behavioral health meds for about 5 months and she said she is very depressed and wants to obtain her medications as soon as possible.  She denied HI/SI.  Lindsay Rivera explained to her about seeking behavioral health assistance at [facility] and the patient said she would go on Monday 10/21/2019.  She has to pick up her granddaughter this afternoon. She also expressed the need to have permanent housing. She does not feel comfortable in her current residence.She was in agreement to having this CM place a referral to [facility] and the referral has been placed.   She was encouraged to call this CM or LCSW back with any questions.
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
## 2019-10-23 ENCOUNTER — Telehealth: Payer: Self-pay

## 2019-10-23 ENCOUNTER — Other Ambulatory Visit: Payer: Self-pay | Admitting: Family Medicine

## 2019-10-23 DIAGNOSIS — G4733 Obstructive sleep apnea (adult) (pediatric): Secondary | ICD-10-CM

## 2019-10-23 NOTE — Progress Notes (Signed)
Patient ID: Lindsay Rivera, female   DOB: [birth year], 59 y.o.   MRN: [id number]  Patient requesting new CPAP mask.  Her her sleep study in February 2020, she uses a small ResMed fullface mask AirFit F 20.

## 2019-10-23 NOTE — Telephone Encounter (Signed)
Order for CPAP mask faxed to [facility]

## 2019-10-29 ENCOUNTER — Telehealth: Payer: Self-pay

## 2019-10-29 NOTE — Telephone Encounter (Signed)
Call placed to [facility] # [phone], to check on the status of the order for CPAP mask.  Spoke to [place] who said she will call the patient to inquire if she received it.  [place] was not able to determine the status of the order based on the information that she had available.

## 2019-11-05 ENCOUNTER — Telehealth: Payer: Self-pay

## 2019-11-05 NOTE — Telephone Encounter (Signed)
Call placed to [facility] # [phone] to check on status of order for CPAP mask. Spoke to Amy with PAP re-supply who was not able to provide any information about the status of the order prior to the call being disconnected. SECURE message sent to [facility] requesting assistance with checking the status of the order.   Call placed to patient and informed her of above.  This CM to update her when more information is received about the order.  The patient is aware that the [facility] referral was denied.  She is interested in other housing options: [place], [place] and [employer] .  This CM to check those options for her. The patient also requested that this CM contact Henrene Pastor with [facility] authority about section 8 housing.  The patient stated that a friend recommended that she contact [name] but she has not been able to get through to her.

## 2019-11-07 ENCOUNTER — Telehealth: Payer: Self-pay

## 2019-11-07 NOTE — Telephone Encounter (Signed)
Call placed to [facility] [place] program # [phone] regarding housing assistance.  She explained that to be eligible for their program the client must be disabled, have income due to that disability and be actively participating in a behavioral health service program who will make the referral. There is a 3-4 year wait for housing.

## 2019-11-08 ENCOUNTER — Ambulatory Visit: Payer: Medicare Other

## 2019-11-11 NOTE — BH Specialist Note (Signed)
Integrated Behavioral Health Initial Visit  MRN: [id number] Name: Lindsay Rivera  Number of [facility] Clinician visits:: 1/6 Session Start time: 10:40 AM  Session End time: 11:00 AM Total time: 20  Type of Service: [place] Interpretor:No. Interpretor Name and Language: NA   Warm Hand Off Completed.       SUBJECTIVE: DARIANA GARBETT is a 59 y.o. female accompanied by self Patient was referred by Dr. Chapman Fitch for depression and anxiety. Patient reports the following symptoms/concerns: Pt reports difficulty managing depression and anxiety symptoms, including low energy/motivation, withdrawn behavior, feelings of sadness, worry, and irritability. States that she has been without her antidepressant medication for approximately 5 months Duration of problem: Ongoing; Severity of problem: moderate  OBJECTIVE: Mood: Anxious and Depressed and Affect: Tearful Risk of harm to self or others: No plan to harm self or others  LIFE CONTEXT: Family and Social: Pt reports limited support School/Work: Pt receives disability, medicaid, and medicare Self-Care: Pt reports not taking prescribed medications for approximately 5 months Life Changes: Pt reports difficulty managing mental health conditions triggered by psychosocial stressors. She reports stress at current residence  GOALS ADDRESSED: Patient will: 1. Reduce symptoms of: anxiety and depression Pt agreed to pick up prescribed medications to assist in management of symptoms 2. Demonstrate ability to: Increase adequate support systems for patient/family Pt agreed to visit [facility] to initiate psychiatry and therapy  INTERVENTIONS: Interventions utilized: Solution-Focused Strategies  Standardized Assessments completed: GAD-7 and PHQ 2&9  ASSESSMENT: Patient currently experiencing difficulty managing depression and anxiety symptoms, including low  energy/motivation, withdrawn behavior, feelings of sadness, worry, and irritability. She discontinued taking antidepressant medication for approximately 5 months and endorses increase in symptoms. Denies SI/HI.Marland Kitchen   Patient may benefit from medication management and therapy. Healthy coping skills identified and pt in agreeance to initiate services through [facility].   PLAN: 1. Follow up with behavioral health clinician on : Schedule follow up appointment to address behavioral health and/or resource needs 2. Behavioral recommendations: Utilize strategies discussed and resources provided 3. Referral(s): Armed forces logistics/support/administrative officer (LME/Outside Clinic) and Community Resources:  Housing referral provided through [employer] CM 4. "From scale of 1-10, how likely are you to follow plan?":   Rebekah [place], LCSW 11/11/19 11:06 AM

## 2019-11-12 ENCOUNTER — Telehealth: Payer: Self-pay

## 2019-11-12 NOTE — Telephone Encounter (Signed)
  Call placed to [facility] # [phone], spoke to Henrene Pastor regarding section 8 housing. She explained that the wait list for that has been closed since 02/07/2018.  The only open housing list is for seniors 55+. She explained that the patient needs to go on line and submit an application. [email or web address].    She could not provide any information about the wait time..     Call placed to patient to provide update regarding housing information. Informed her that the targeting key program requires individuals be receiving behavioral health support and that provider must make the referral.  In addition there is a 3-4 year wait.   Then provided the patient with the information about the housing authority.  Explained to her that she will need to set up an email account.  She does not have a computer but has email and will need to find someone to assist her with the application.    She has not heard any information from [employer] about the CPAP mask.

## 2019-11-17 ENCOUNTER — Emergency Department (HOSPITAL_COMMUNITY): Payer: No Typology Code available for payment source

## 2019-11-17 ENCOUNTER — Other Ambulatory Visit: Payer: Self-pay

## 2019-11-17 ENCOUNTER — Encounter (HOSPITAL_COMMUNITY): Payer: Self-pay | Admitting: Internal Medicine

## 2019-11-17 ENCOUNTER — Inpatient Hospital Stay (HOSPITAL_COMMUNITY)
Admission: EM | Admit: 2019-11-17 | Discharge: 2019-11-19 | DRG: 125 | Disposition: A | Discharge status: Home Health | Payer: No Typology Code available for payment source | Attending: Internal Medicine | Admitting: Internal Medicine

## 2019-11-17 DIAGNOSIS — J449 Chronic obstructive pulmonary disease, unspecified: Secondary | ICD-10-CM | POA: Diagnosis present

## 2019-11-17 DIAGNOSIS — R531 Weakness: Secondary | ICD-10-CM

## 2019-11-17 DIAGNOSIS — I110 Hypertensive heart disease with heart failure: Secondary | ICD-10-CM | POA: Diagnosis present

## 2019-11-17 DIAGNOSIS — Z91040 Latex allergy status: Secondary | ICD-10-CM

## 2019-11-17 DIAGNOSIS — M47812 Spondylosis without myelopathy or radiculopathy, cervical region: Secondary | ICD-10-CM | POA: Diagnosis present

## 2019-11-17 DIAGNOSIS — H547 Unspecified visual loss: Secondary | ICD-10-CM | POA: Diagnosis not present

## 2019-11-17 DIAGNOSIS — E089 Diabetes mellitus due to underlying condition without complications: Secondary | ICD-10-CM | POA: Diagnosis present

## 2019-11-17 DIAGNOSIS — N179 Acute kidney failure, unspecified: Secondary | ICD-10-CM | POA: Diagnosis present

## 2019-11-17 DIAGNOSIS — Z951 Presence of aortocoronary bypass graft: Secondary | ICD-10-CM

## 2019-11-17 DIAGNOSIS — G4730 Sleep apnea, unspecified: Secondary | ICD-10-CM | POA: Diagnosis present

## 2019-11-17 DIAGNOSIS — H543 Unqualified visual loss, both eyes: Secondary | ICD-10-CM | POA: Diagnosis not present

## 2019-11-17 DIAGNOSIS — G8929 Other chronic pain: Secondary | ICD-10-CM | POA: Diagnosis present

## 2019-11-17 DIAGNOSIS — Z9851 Tubal ligation status: Secondary | ICD-10-CM

## 2019-11-17 DIAGNOSIS — E663 Overweight: Secondary | ICD-10-CM | POA: Diagnosis present

## 2019-11-17 DIAGNOSIS — Z6827 Body mass index (BMI) 27.0-27.9, adult: Secondary | ICD-10-CM

## 2019-11-17 DIAGNOSIS — G8194 Hemiplegia, unspecified affecting left nondominant side: Secondary | ICD-10-CM | POA: Diagnosis present

## 2019-11-17 DIAGNOSIS — I5032 Chronic diastolic (congestive) heart failure: Secondary | ICD-10-CM | POA: Diagnosis present

## 2019-11-17 DIAGNOSIS — G43909 Migraine, unspecified, not intractable, without status migrainosus: Secondary | ICD-10-CM | POA: Diagnosis present

## 2019-11-17 DIAGNOSIS — I10 Essential (primary) hypertension: Secondary | ICD-10-CM | POA: Diagnosis present

## 2019-11-17 DIAGNOSIS — E785 Hyperlipidemia, unspecified: Secondary | ICD-10-CM | POA: Diagnosis present

## 2019-11-17 DIAGNOSIS — F319 Bipolar disorder, unspecified: Secondary | ICD-10-CM | POA: Diagnosis present

## 2019-11-17 DIAGNOSIS — Z7902 Long term (current) use of antithrombotics/antiplatelets: Secondary | ICD-10-CM

## 2019-11-17 DIAGNOSIS — M545 Low back pain, unspecified: Secondary | ICD-10-CM | POA: Diagnosis present

## 2019-11-17 DIAGNOSIS — Z880 Allergy status to penicillin: Secondary | ICD-10-CM

## 2019-11-17 DIAGNOSIS — Z20822 Contact with and (suspected) exposure to covid-19: Secondary | ICD-10-CM | POA: Diagnosis present

## 2019-11-17 DIAGNOSIS — E039 Hypothyroidism, unspecified: Secondary | ICD-10-CM | POA: Diagnosis present

## 2019-11-17 DIAGNOSIS — E119 Type 2 diabetes mellitus without complications: Secondary | ICD-10-CM | POA: Diagnosis present

## 2019-11-17 DIAGNOSIS — E890 Postprocedural hypothyroidism: Secondary | ICD-10-CM | POA: Diagnosis present

## 2019-11-17 DIAGNOSIS — I2510 Atherosclerotic heart disease of native coronary artery without angina pectoris: Secondary | ICD-10-CM | POA: Diagnosis present

## 2019-11-17 DIAGNOSIS — F331 Major depressive disorder, recurrent, moderate: Secondary | ICD-10-CM | POA: Diagnosis present

## 2019-11-17 DIAGNOSIS — Z8673 Personal history of transient ischemic attack (TIA), and cerebral infarction without residual deficits: Secondary | ICD-10-CM

## 2019-11-17 DIAGNOSIS — F3130 Bipolar disorder, current episode depressed, mild or moderate severity, unspecified: Secondary | ICD-10-CM

## 2019-11-17 DIAGNOSIS — W06XXXA Fall from bed, initial encounter: Secondary | ICD-10-CM | POA: Diagnosis present

## 2019-11-17 DIAGNOSIS — Z8249 Family history of ischemic heart disease and other diseases of the circulatory system: Secondary | ICD-10-CM

## 2019-11-17 DIAGNOSIS — D329 Benign neoplasm of meninges, unspecified: Secondary | ICD-10-CM | POA: Diagnosis present

## 2019-11-17 DIAGNOSIS — F1910 Other psychoactive substance abuse, uncomplicated: Secondary | ICD-10-CM

## 2019-11-17 DIAGNOSIS — Z79899 Other long term (current) drug therapy: Secondary | ICD-10-CM

## 2019-11-17 DIAGNOSIS — I252 Old myocardial infarction: Secondary | ICD-10-CM

## 2019-11-17 DIAGNOSIS — Z87891 Personal history of nicotine dependence: Secondary | ICD-10-CM

## 2019-11-17 DIAGNOSIS — Z9049 Acquired absence of other specified parts of digestive tract: Secondary | ICD-10-CM

## 2019-11-17 LAB — COMPREHENSIVE METABOLIC PANEL
ALT: 18 U/L (ref 0–44)
AST: 20 U/L (ref 15–41)
Albumin: 4.1 g/dL (ref 3.5–5.0)
Alkaline Phosphatase: 91 U/L (ref 38–126)
Anion gap: 11 (ref 5–15)
BUN: 17 mg/dL (ref 6–20)
CO2: 28 mmol/L (ref 22–32)
Calcium: 9.6 mg/dL (ref 8.9–10.3)
Chloride: 101 mmol/L (ref 98–111)
Creatinine, Ser: 1.34 mg/dL — ABNORMAL HIGH (ref 0.44–1.00)
GFR, Estimated: 43 mL/min — ABNORMAL LOW (ref >60)
Glucose, Bld: 98 mg/dL (ref 70–99)
Potassium: 3.9 mmol/L (ref 3.5–5.1)
Sodium: 140 mmol/L (ref 135–145)
Total Bilirubin: 0.5 mg/dL (ref 0.3–1.2)
Total Protein: 8 g/dL (ref 6.5–8.1)

## 2019-11-17 LAB — LIPID PANEL
Cholesterol: 263 mg/dL — ABNORMAL HIGH (ref 0–200)
HDL: 70 mg/dL (ref >40)
Total CHOL/HDL Ratio: 3.8 RATIO
Triglycerides: 160 mg/dL — ABNORMAL HIGH (ref <150)
VLDL: 32 mg/dL (ref 0–40)

## 2019-11-17 LAB — DIFFERENTIAL
Abs Immature Granulocytes: 0.09 10*3/uL — ABNORMAL HIGH (ref 0.00–0.07)
Basophils Absolute: 0.1 10*3/uL (ref 0.0–0.1)
Basophils Relative: 1 %
Eosinophils Absolute: 0.2 10*3/uL (ref 0.0–0.5)
Eosinophils Relative: 1 %
Immature Granulocytes: 1 %
Lymphocytes Relative: 17 %
Lymphs Abs: 2.2 10*3/uL (ref 0.7–4.0)
Monocytes Absolute: 0.6 10*3/uL (ref 0.1–1.0)
Monocytes Relative: 5 %
Neutro Abs: 9.6 10*3/uL — ABNORMAL HIGH (ref 1.7–7.7)
Neutrophils Relative %: 75 %

## 2019-11-17 LAB — I-STAT BETA HCG BLOOD, ED (MC, WL, AP ONLY): I-stat hCG, quantitative: <5 m[IU]/mL (ref <5)

## 2019-11-17 LAB — I-STAT CHEM 8, ED
BUN: 24 mg/dL — ABNORMAL HIGH (ref 6–20)
Calcium, Ion: 1.11 mmol/L — ABNORMAL LOW (ref 1.15–1.40)
Chloride: 104 mmol/L (ref 98–111)
Creatinine, Ser: 1.2 mg/dL — ABNORMAL HIGH (ref 0.44–1.00)
Glucose, Bld: 94 mg/dL (ref 70–99)
HCT: 49 % — ABNORMAL HIGH (ref 36.0–46.0)
Hemoglobin: 16.7 g/dL — ABNORMAL HIGH (ref 12.0–15.0)
Potassium: 4.2 mmol/L (ref 3.5–5.1)
Sodium: 141 mmol/L (ref 135–145)
TCO2: 31 mmol/L (ref 22–32)

## 2019-11-17 LAB — VOLATILES,BLD-ACETONE,ETHANOL,ISOPROP,METHANOL
Acetone, blood: <0.01 g/dL (ref 0.000–0.010)
Ethanol, blood: <0.01 g/dL (ref 0.000–0.010)
Isopropanol, blood: <0.01 g/dL (ref 0.000–0.010)
Methanol, blood: <0.01 g/dL (ref 0.000–0.010)

## 2019-11-17 LAB — CBG MONITORING, ED
Glucose-Capillary: 97 mg/dL (ref 70–99)
Glucose-Capillary: 127 mg/dL — ABNORMAL HIGH (ref 70–99)

## 2019-11-17 LAB — CBC
HCT: 49.6 % — ABNORMAL HIGH (ref 36.0–46.0)
Hemoglobin: 15.9 g/dL — ABNORMAL HIGH (ref 12.0–15.0)
MCH: 31 pg (ref 26.0–34.0)
MCHC: 32.1 g/dL (ref 30.0–36.0)
MCV: 96.7 fL (ref 80.0–100.0)
Platelets: 431 10*3/uL — ABNORMAL HIGH (ref 150–400)
RBC: 5.13 MIL/uL — ABNORMAL HIGH (ref 3.87–5.11)
RDW: 16.3 % — ABNORMAL HIGH (ref 11.5–15.5)
WBC: 12.7 10*3/uL — ABNORMAL HIGH (ref 4.0–10.5)
nRBC: 0 % (ref 0.0–0.2)

## 2019-11-17 LAB — RESPIRATORY PANEL BY RT PCR (FLU A&B, COVID)
Influenza A by PCR: NEGATIVE
Influenza B by PCR: NEGATIVE
SARS Coronavirus 2 by RT PCR: NEGATIVE

## 2019-11-17 LAB — PROTIME-INR
INR: 0.9 (ref 0.8–1.2)
Prothrombin Time: 12.2 seconds (ref 11.4–15.2)

## 2019-11-17 LAB — TSH: TSH: 107.055 u[IU]/mL — ABNORMAL HIGH (ref 0.350–4.500)

## 2019-11-17 LAB — APTT: aPTT: 31 seconds (ref 24–36)

## 2019-11-17 LAB — HIV ANTIBODY (ROUTINE TESTING W REFLEX): HIV Screen 4th Generation wRfx: NONREACTIVE

## 2019-11-17 MED ORDER — ROSUVASTATIN CALCIUM 20 MG PO TABS
20.0000 mg | ORAL_TABLET | Freq: Every day | ORAL | Status: DC
  Administered 2019-11-18: 20 mg via ORAL
  Filled 2019-11-17: qty 1

## 2019-11-17 MED ORDER — ALBUTEROL SULFATE HFA 108 (90 BASE) MCG/ACT IN AERS: 2.0000 | INHALATION_SPRAY | RESPIRATORY_TRACT | Status: DC | PRN

## 2019-11-17 MED ORDER — ASPIRIN 325 MG PO TABS
325.0000 mg | ORAL_TABLET | Freq: Every day | ORAL | Status: DC
  Administered 2019-11-17 – 2019-11-19 (×2): 325 mg via ORAL
  Filled 2019-11-17 (×2): qty 1

## 2019-11-17 MED ORDER — SENNOSIDES-DOCUSATE SODIUM 8.6-50 MG PO TABS: 1.0000 | ORAL_TABLET | Freq: Every evening | ORAL | Status: DC | PRN

## 2019-11-17 MED ORDER — ACETAMINOPHEN 650 MG RE SUPP: 650.0000 mg | RECTAL | Status: DC | PRN

## 2019-11-17 MED ORDER — LINACLOTIDE 145 MCG PO CAPS
290.0000 ug | ORAL_CAPSULE | Freq: Every day | ORAL | Status: DC
  Administered 2019-11-18 – 2019-11-19 (×2): 290 ug via ORAL
  Filled 2019-11-17 (×3): qty 2

## 2019-11-17 MED ORDER — LORAZEPAM 2 MG/ML IJ SOLN
INTRAMUSCULAR | Status: AC
  Administered 2019-11-17: 2 mg
  Filled 2019-11-17: qty 1

## 2019-11-17 MED ORDER — CLEVIDIPINE BUTYRATE 0.5 MG/ML IV EMUL
INTRAVENOUS | Status: AC
  Administered 2019-11-17: 1 mg/h via INTRAVENOUS
  Filled 2019-11-17: qty 50

## 2019-11-17 MED ORDER — DIAZEPAM 5 MG PO TABS
10.0000 mg | ORAL_TABLET | Freq: Every day | ORAL | Status: DC | PRN
  Administered 2019-11-17: 10 mg via ORAL
  Filled 2019-11-17: qty 2

## 2019-11-17 MED ORDER — CLEVIDIPINE BUTYRATE 0.5 MG/ML IV EMUL
0.0000 mg/h | INTRAVENOUS | Status: DC
  Filled 2019-11-17: qty 50

## 2019-11-17 MED ORDER — SODIUM CHLORIDE 0.9 % IV SOLN: INTRAVENOUS | Status: DC

## 2019-11-17 MED ORDER — INSULIN ASPART 100 UNIT/ML ~~LOC~~ SOLN: 0.0000 [IU] | Freq: Three times a day (TID) | SUBCUTANEOUS | Status: DC

## 2019-11-17 MED ORDER — CLOPIDOGREL BISULFATE 75 MG PO TABS
75.0000 mg | ORAL_TABLET | Freq: Every day | ORAL | Status: DC
  Administered 2019-11-19: 75 mg via ORAL
  Filled 2019-11-17 (×2): qty 1

## 2019-11-17 MED ORDER — ACETAMINOPHEN 160 MG/5ML PO SOLN: 650.0000 mg | ORAL | Status: DC | PRN

## 2019-11-17 MED ORDER — ONDANSETRON HCL 4 MG/2ML IJ SOLN: 4.0000 mg | Freq: Four times a day (QID) | INTRAMUSCULAR | Status: DC | PRN

## 2019-11-17 MED ORDER — ENOXAPARIN SODIUM 40 MG/0.4ML ~~LOC~~ SOLN
40.0000 mg | SUBCUTANEOUS | Status: DC
  Administered 2019-11-17 – 2019-11-18 (×2): 40 mg via SUBCUTANEOUS
  Filled 2019-11-17 (×2): qty 0.4

## 2019-11-17 MED ORDER — CARVEDILOL 3.125 MG PO TABS
3.1250 mg | ORAL_TABLET | Freq: Two times a day (BID) | ORAL | Status: DC
  Administered 2019-11-17 – 2019-11-19 (×4): 3.125 mg via ORAL
  Filled 2019-11-17 (×5): qty 1

## 2019-11-17 MED ORDER — STROKE: EARLY STAGES OF RECOVERY BOOK
Freq: Once | Status: AC
  Filled 2019-11-17: qty 1

## 2019-11-17 MED ORDER — PANTOPRAZOLE SODIUM 40 MG PO TBEC
40.0000 mg | DELAYED_RELEASE_TABLET | Freq: Two times a day (BID) | ORAL | Status: DC
  Administered 2019-11-18 – 2019-11-19 (×3): 40 mg via ORAL
  Filled 2019-11-17 (×3): qty 1

## 2019-11-17 MED ORDER — FLUOXETINE HCL 20 MG PO CAPS
40.0000 mg | ORAL_CAPSULE | Freq: Every day | ORAL | Status: DC
  Administered 2019-11-19: 40 mg via ORAL
  Filled 2019-11-17: qty 2

## 2019-11-17 MED ORDER — ACETAMINOPHEN 325 MG PO TABS: 650.0000 mg | ORAL_TABLET | ORAL | Status: DC | PRN

## 2019-11-17 MED ORDER — FLUTICASONE PROPIONATE 50 MCG/ACT NA SUSP
2.0000 | Freq: Every day | NASAL | Status: DC
  Administered 2019-11-19: 2 via NASAL
  Filled 2019-11-17: qty 16

## 2019-11-17 MED ORDER — SODIUM CHLORIDE 0.9% FLUSH
3.0000 mL | Freq: Once | INTRAVENOUS | Status: AC
  Administered 2019-11-17: 3 mL via INTRAVENOUS

## 2019-11-17 MED ORDER — LORAZEPAM 2 MG/ML IJ SOLN: 2.0000 mg | Freq: Once | INTRAMUSCULAR | Status: DC | PRN

## 2019-11-17 MED ORDER — QUETIAPINE FUMARATE 200 MG PO TABS
300.0000 mg | ORAL_TABLET | Freq: Every day | ORAL | Status: DC
  Administered 2019-11-18: 300 mg via ORAL
  Filled 2019-11-17 (×4): qty 1

## 2019-11-17 MED ORDER — LORAZEPAM BOLUS VIA INFUSION: 2.0000 mg | INTRAVENOUS | Status: DC | PRN

## 2019-11-17 MED ORDER — LEVOTHYROXINE SODIUM 112 MCG PO TABS
112.0000 ug | ORAL_TABLET | Freq: Every day | ORAL | Status: DC
  Administered 2019-11-18: 112 ug via ORAL
  Filled 2019-11-17: qty 1

## 2019-11-17 MED ORDER — IOHEXOL 350 MG/ML SOLN
100.0000 mL | Freq: Once | INTRAVENOUS | Status: AC | PRN
  Administered 2019-11-17: 100 mL via INTRAVENOUS

## 2019-11-17 MED ORDER — GADOBUTROL 1 MMOL/ML IV SOLN
7.5000 mL | Freq: Once | INTRAVENOUS | Status: AC | PRN
  Administered 2019-11-17: 7.5 mL via INTRAVENOUS
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
## 2019-11-17 NOTE — ED Provider Notes (Signed)
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 Signed   By: MarNelson ChimesD.   On: 11/17/2019 11:30    Procedures .Critical Care Performed by: SchGareth MorganD Authorized by: SchGareth MorganD   Critical care provider statement:    Critical care time (minutes):  45   Critical care was time spent personally by me on the following activities:  Discussions with consultants, evaluation of patient's response to treatment, examination of patient, ordering and performing treatments and interventions, ordering and review of laboratory studies, ordering and review of radiographic studies, pulse oximetry, re-evaluation of patient's condition, obtaining history from patient or surrogate and review of old  charts   (including critical care time)  Medications Ordered in ED Medications  carvedilol (COREG) tablet 3.125 mg (3.125 mg Oral Given 11/17/19 2035)  rosuvastatin (CRESTOR) tablet 20 mg (has no administration in time range)  diazepam (VALIUM) tablet 10 mg (10 mg Oral Given 11/17/19 2044)  FLUoxetine (PROZAC) capsule 40 mg (has no administration in time range)  QUEtiapine  (SEROQUEL) tablet 300 mg (has no administration in time range)  levothyroxine (SYNTHROID) tablet 112 mcg (has no administration in time range)  linaclotide (LINZESS) capsule 290 mcg (has no administration in time range)  pantoprazole (PROTONIX) EC tablet 40 mg (has no administration in time range)  clopidogrel (PLAVIX) tablet 75 mg (has no administration in time range)  albuterol (VENTOLIN HFA) 108 (90 Base) MCG/ACT inhaler 2 puff (has no administration in time range)  fluticasone (FLONASE) 50 MCG/ACT nasal spray 2 spray (has no administration in time range)  0.9 %  sodium chloride infusion ( Intravenous New Bag/Given 11/17/19 1717)  acetaminophen (TYLENOL) tablet 650 mg (has no administration in time range)    Or  acetaminophen (TYLENOL) 160 MG/5ML solution 650 mg (has no administration in time range)    Or  acetaminophen (TYLENOL) suppository 650 mg (has no administration in time range)  senna-docusate (Senokot-S) tablet 1 tablet (has no administration in time range)  enoxaparin (LOVENOX) injection 40 mg (40 mg Subcutaneous Given 11/17/19 2028)  ondansetron (ZOFRAN) injection 4 mg (has no administration in time range)  aspirin tablet 325 mg (325 mg Oral Given 11/17/19 2028)  insulin aspart (novoLOG) injection 0-15 Units (has no administration in time range)  sodium chloride flush (NS) 0.9 % injection 3 mL (3 mLs Intravenous Given 11/17/19 1219)  iohexol (OMNIPAQUE) 350 MG/ML injection 100 mL (100 mLs Intravenous Contrast Given 11/17/19 1140)  LORazepam (ATIVAN) 2 MG/ML injection (2 mg  Given 11/17/19 1241)  gadobutrol (GADAVIST) 1 MMOL/ML injection 7.5 mL (7.5 mLs Intravenous Contrast Given 11/17/19 1331)   stroke: mapping our early stages of recovery book ( Does not apply Given 11/17/19 1831)    ED Course  I have reviewed the triage vital signs and the nursing notes.  Pertinent labs & imaging results that were available during my care of the patient were reviewed by me and considered  in my medical decision making (see chart for details).    MDM Rules/Calculators/A&P                          59yo female with history of MI, CVA, htn, bipolar, CHF, COPD, hlpd, htn, migraine headaches, substance abuse, sleep apnea, DM who presented as a code stroke from home after she presented with vision loss and left sided weakness. DDx includes CVA, PRES, stress reaction.  CT head and CTA without acute abnormalities. MRI shows meningioma with pontine mass effect however does not show explanation for vision loss.  NO acidosis and no significant electrolyte abnormality and doubt toxic/metabolic etiology.  Low suspicion for ophthalmologic etiology given bilateral eye symptoms however will consult Ophthalmology, Dr. Katy Fitch for further evaluation.  DDx continues to include MRI negative PRES particularly in the setting of severe hypertension on arrival or stress reaction.  Started on cleviprex initially however BP significantly decreased and this was discontinued.   Final Clinical Impression(s) / ED Diagnoses Final diagnoses:  Vision loss, bilateral  Meningioma [facility])    Rx / DC Orders ED Discharge Orders    None       Gareth Morgan, MD 11/17/19 2142

## 2019-11-17 NOTE — ED Notes (Signed)
Pt ambulated to bathroom with slight shuffling of Lt foot. Tolerated well. Pt has back pain and is anxious. Will medicate.

## 2019-11-17 NOTE — Consult Note (Addendum)
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 (!) 153/82, pulse (!) 59, temperature 97.8 F (36.6 C), temperature source Oral, resp. rate 20, height 5\' 2"  (1.575 m), weight 67.4 kg, SpO2 97 %.  Physical Exam:  Examination is limited due to somnolence and poor patient effort. General: Patient is somnolent and in NAD.  Neurologic: Patient is alternately somnolent and then emotionally labile mood and affect. She arouses easily to voice and follows all commands. Disoriented to time but is oriented to place and situation. Speech is limited but appropriate. No dysarthria or aphasia. She reports inability to lift left arm or leg.  Cranial Nerves: II: Visual acuity is impaired. Reports inability to discern objects bilaterally.  III, IV, VI: EOMs intact, no ptosis or nystagmus; pupils equal, round, reactive to light and accommodation. V:  Decreased sensation to pinprick and light touch on the left; jaw strength equal bilaterally. VII: Facial muscles intact and symmetric. VIII: Hearing intact- whispered words heard bilaterally. IX: Shoulder shrug, head movement intact and equal bilaterally. XII: Tongue protrudes midline, no tremors.      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 MRI findings do not corelate with patient's symptoms and does not explain patient's vision loss or hemiparesis. No other intracranial abnormality was identified. Since the meningioma is causing mass effect on the pons, this will need to be treated. Treatment will most likely consist of radiation therapy. Neurosurgery will continue to follow and make plan for treatment.   Lindsay Rivera 11/17/2019, 8:12 PM

## 2019-11-17 NOTE — ED Notes (Signed)
Provider notified of sleep apnea, request cpap.

## 2019-11-17 NOTE — H&P (Signed)
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 discontinued -Resume Coreg tonight   HLD -Recent lipids, on 9/3: 275/58/19/118 -This begs the question of whether she is actually taking Crestor -Continue Crestor -Recheck lipids   DM -Recent A1c shows good control, 5.6 on 9/10 -Hold home PO medication (Metformin) -Will order moderate-scale SSI  Hypothyroidism -Check TSH -Continue Synthroid at current dose for now  Chronic pain -I have reviewed this patient in the  Controlled Substances Reporting System.  He is not receiving chronic medications.  -He is not at particularly high risk of opioid misuse or diversion, but is at significantly increased risk of overdose.  Bipolar d/o  -There is concern for supratentorial contribution to her presentation -Will request psych consult -Continue Valium, Prozac, Seroquel    Note: This patient has been tested and is pending for the novel coronavirus COVID-19.  DVT prophylaxis:  Lovenox  Code Status: Full  Family Communication: None present Disposition Plan:  The patient is from: home  Anticipated d/c is to: home without [facility] services    Anticipated d/c date will depend on clinical response to treatment, but possibly as early as tomorrow if she has excellent response to treatment  Patient is currently: acutely ill Consults called: Neurology; Neurosurgery; PT/OT/ST Admission status: It is my clinical opinion that referral for OBSERVATION is reasonable and necessary in this patient based on the above information provided. The aforementioned taken together are felt to place the patient at high risk for further clinical deterioration. However it is anticipated that the patient may be medically stable for discharge from the hospital within 24 to 48 hours.    Karmen Bongo MD [facility]   How to contact the [facility] Attending or Consulting provider [place] or covering provider during after hours [place], for this patient?  1. Check the care team in [facility] and look for a) attending/consulting TRH provider listed and b) the [facility] team listed 2. Log into [email or web address] and use [place]'s universal password to access. If you do not have the password, please contact the hospital operator. 3. Locate the [facility] provider you are looking for under [facility] and page to a number that you can be directly reached. 4. If you still have difficulty reaching the provider, please page the [facility] (Director on Call) for the Hospitalists listed on amion for assistance.   11/17/2019, 5:40 PM
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
## 2019-11-17 NOTE — ED Notes (Signed)
Patient transported to MRI 

## 2019-11-18 ENCOUNTER — Encounter (HOSPITAL_COMMUNITY): Payer: Self-pay | Admitting: Internal Medicine

## 2019-11-18 ENCOUNTER — Encounter: Payer: Medicaid Other | Admitting: Internal Medicine

## 2019-11-18 DIAGNOSIS — E038 Other specified hypothyroidism: Secondary | ICD-10-CM | POA: Diagnosis not present

## 2019-11-18 DIAGNOSIS — F319 Bipolar disorder, unspecified: Secondary | ICD-10-CM | POA: Diagnosis present

## 2019-11-18 DIAGNOSIS — E119 Type 2 diabetes mellitus without complications: Secondary | ICD-10-CM | POA: Diagnosis present

## 2019-11-18 DIAGNOSIS — Z8673 Personal history of transient ischemic attack (TIA), and cerebral infarction without residual deficits: Secondary | ICD-10-CM | POA: Diagnosis not present

## 2019-11-18 DIAGNOSIS — Z9851 Tubal ligation status: Secondary | ICD-10-CM | POA: Diagnosis not present

## 2019-11-18 DIAGNOSIS — Z9049 Acquired absence of other specified parts of digestive tract: Secondary | ICD-10-CM | POA: Diagnosis not present

## 2019-11-18 DIAGNOSIS — F3130 Bipolar disorder, current episode depressed, mild or moderate severity, unspecified: Secondary | ICD-10-CM | POA: Diagnosis not present

## 2019-11-18 DIAGNOSIS — I252 Old myocardial infarction: Secondary | ICD-10-CM | POA: Diagnosis not present

## 2019-11-18 DIAGNOSIS — G8194 Hemiplegia, unspecified affecting left nondominant side: Secondary | ICD-10-CM | POA: Diagnosis present

## 2019-11-18 DIAGNOSIS — I2510 Atherosclerotic heart disease of native coronary artery without angina pectoris: Secondary | ICD-10-CM | POA: Diagnosis present

## 2019-11-18 DIAGNOSIS — H543 Unqualified visual loss, both eyes: Secondary | ICD-10-CM | POA: Diagnosis present

## 2019-11-18 DIAGNOSIS — E785 Hyperlipidemia, unspecified: Secondary | ICD-10-CM | POA: Diagnosis present

## 2019-11-18 DIAGNOSIS — I5032 Chronic diastolic (congestive) heart failure: Secondary | ICD-10-CM | POA: Diagnosis present

## 2019-11-18 DIAGNOSIS — F331 Major depressive disorder, recurrent, moderate: Secondary | ICD-10-CM | POA: Diagnosis not present

## 2019-11-18 DIAGNOSIS — G8929 Other chronic pain: Secondary | ICD-10-CM | POA: Diagnosis present

## 2019-11-18 DIAGNOSIS — H547 Unspecified visual loss: Secondary | ICD-10-CM | POA: Diagnosis present

## 2019-11-18 DIAGNOSIS — E663 Overweight: Secondary | ICD-10-CM | POA: Diagnosis present

## 2019-11-18 DIAGNOSIS — D329 Benign neoplasm of meninges, unspecified: Secondary | ICD-10-CM

## 2019-11-18 DIAGNOSIS — N179 Acute kidney failure, unspecified: Secondary | ICD-10-CM | POA: Diagnosis present

## 2019-11-18 DIAGNOSIS — I110 Hypertensive heart disease with heart failure: Secondary | ICD-10-CM | POA: Diagnosis present

## 2019-11-18 DIAGNOSIS — Z20822 Contact with and (suspected) exposure to covid-19: Secondary | ICD-10-CM | POA: Diagnosis present

## 2019-11-18 DIAGNOSIS — J449 Chronic obstructive pulmonary disease, unspecified: Secondary | ICD-10-CM | POA: Diagnosis present

## 2019-11-18 DIAGNOSIS — Z951 Presence of aortocoronary bypass graft: Secondary | ICD-10-CM | POA: Diagnosis not present

## 2019-11-18 DIAGNOSIS — M545 Low back pain, unspecified: Secondary | ICD-10-CM | POA: Diagnosis present

## 2019-11-18 DIAGNOSIS — G4730 Sleep apnea, unspecified: Secondary | ICD-10-CM | POA: Diagnosis present

## 2019-11-18 DIAGNOSIS — M47812 Spondylosis without myelopathy or radiculopathy, cervical region: Secondary | ICD-10-CM | POA: Diagnosis present

## 2019-11-18 DIAGNOSIS — E890 Postprocedural hypothyroidism: Secondary | ICD-10-CM | POA: Diagnosis present

## 2019-11-18 DIAGNOSIS — G43909 Migraine, unspecified, not intractable, without status migrainosus: Secondary | ICD-10-CM | POA: Diagnosis present

## 2019-11-18 DIAGNOSIS — W06XXXA Fall from bed, initial encounter: Secondary | ICD-10-CM | POA: Diagnosis present

## 2019-11-18 LAB — CBG MONITORING, ED: Glucose-Capillary: 107 mg/dL — ABNORMAL HIGH (ref 70–99)

## 2019-11-18 LAB — GLUCOSE, CAPILLARY
Glucose-Capillary: 105 mg/dL — ABNORMAL HIGH (ref 70–99)
Glucose-Capillary: 135 mg/dL — ABNORMAL HIGH (ref 70–99)

## 2019-11-18 MED ORDER — SODIUM CHLORIDE 0.9% FLUSH
10.0000 mL | INTRAVENOUS | Status: DC | PRN
  Administered 2019-11-19: 10 mL

## 2019-11-18 MED ORDER — LEVOTHYROXINE SODIUM 75 MCG PO TABS: 150.0000 ug | ORAL_TABLET | Freq: Every day | ORAL | Status: DC

## 2019-11-18 MED ORDER — SODIUM CHLORIDE 0.9% FLUSH
10.0000 mL | Freq: Two times a day (BID) | INTRAVENOUS | Status: DC
  Administered 2019-11-18 – 2019-11-19 (×4): 10 mL

## 2019-11-18 MED ORDER — OXYCODONE-ACETAMINOPHEN 5-325 MG PO TABS
1.0000 | ORAL_TABLET | Freq: Four times a day (QID) | ORAL | Status: DC | PRN
  Administered 2019-11-18 – 2019-11-19 (×4): 1 via ORAL
  Filled 2019-11-18 (×4): qty 1

## 2019-11-18 MED ORDER — LEVOTHYROXINE SODIUM 100 MCG/5ML IV SOLN
75.0000 ug | Freq: Every day | INTRAVENOUS | Status: DC
  Administered 2019-11-19: 75 ug via INTRAVENOUS
  Filled 2019-11-18 (×2): qty 5

## 2019-11-18 MED ORDER — QUETIAPINE FUMARATE 50 MG PO TABS
150.0000 mg | ORAL_TABLET | Freq: Every day | ORAL | Status: DC
  Administered 2019-11-18: 150 mg via ORAL
  Filled 2019-11-18: qty 1

## 2019-11-18 NOTE — TOC Initial Note (Signed)
Transition of Care [facility]) - Initial/Assessment Note    Patient Details  Name: Lindsay Rivera MRN: [id number] Date of Birth: 1960/06/21  Transition of Care [facility] CM/SW Contact:    Joanne Chars, LCSW Phone Number: 11/18/2019, 3:34 PM  Clinical Narrative:  CSW met with pt to discuss discharge plan.  Pt with Joycelyn Schmid, significant other, and permission given to speak with Joycelyn Schmid.  Pt open to [facility] services at discharge, not open to going to any other facility.  Pt states she is not feeling well and did not want to spend much time discussing discharge at this time.  Pt has walker, cane, and shower chair already in home.  PCP Dr Chapman Fitch at [facility] and wellness.  Lives in [place] and does live alone.  Pt is vaccinated for covid.  Choice document for [facility] provided.                   Expected Discharge Plan: [place] Barriers to Discharge: Continued Medical Work up   Patient Goals and CMS Choice Patient states their goals for this hospitalization and ongoing recovery are:: "be able to walk and see good" CMS Medicare.gov Compare Post Acute Care list provided to:: Patient Choice offered to / list presented to : Patient  Expected Discharge Plan and Services Expected Discharge Plan: [place] Choice: [place] arrangements for the past 2 months: Apartment                                      Prior Living Arrangements/Services Living arrangements for the past 2 months: Apartment Lives with:: Self Patient language and need for interpreter reviewed:: Yes Do you feel safe going back to the place where you live?: Yes      Need for Family Participation in Patient Care: No (Comment) Care giver support system in place?: Yes (comment)   Criminal Activity/Legal Involvement Pertinent to Current Situation/Hospitalization: No - Comment as needed  Activities of Daily Living Home Assistive  Devices/Equipment: Cane (specify quad or straight), Walker (specify type), Eyeglasses ADL Screening (condition at time of admission) Patient's cognitive ability adequate to safely complete daily activities?: Yes Is the patient deaf or have difficulty hearing?: No Does the patient have difficulty seeing, even when wearing glasses/contacts?: Yes Does the patient have difficulty concentrating, remembering, or making decisions?: No Patient able to express need for assistance with ADLs?: Yes Does the patient have difficulty dressing or bathing?: No Independently performs ADLs?: Yes (appropriate for developmental age) Does the patient have difficulty walking or climbing stairs?: Yes Weakness of Legs: Both Weakness of Arms/Hands: None  Permission Sought/Granted Permission sought to share information with : Facility Sport and exercise psychologist, Family Supports Permission granted to share information with : Yes, Verbal Permission Granted  Share Information with NAME: Joycelyn Schmid, significant other  Permission granted to share info w AGENCY: HH        Emotional Assessment Appearance:: Appears stated age Attitude/Demeanor/Rapport: Complaining Affect (typically observed): Agitated Orientation: : Oriented to Self, Oriented to Place, Oriented to  Time, Oriented to Situation Alcohol / Substance Use: Other (comment) (by history) Psych Involvement: No (comment) (by history)  Admission diagnosis:  Vision loss [H54.7] Meningioma ([place]) [D32.9] Vision loss, bilateral [H54.3] Patient Active Problem List   Diagnosis Date Noted  . Vision loss 11/17/2019  . Paresthesia of both hands 11/20/2018  .  Incontinence of feces 11/20/2018  . Neck pain 11/20/2018  . Chest pain 01/09/2018  . Urinary incontinence 12/02/2015  . Hot flashes 10/23/2015  . CAD in native artery 04/20/2015  . Coronary artery disease involving native coronary artery of native heart with angina pectoris ([place])   . Essential hypertension   .  Hyperlipidemia   . CAD -S/P LM DES 04/17/15 04/17/2015  . CAD involving native coronary artery of native heart with [place]   . Coronary artery disease involving coronary bypass graft of native heart with unstable angina pectoris ([place])   . Periodontal disease 03/25/2015  . S/P hysterectomy 04/08/2014  . Bipolar disorder ([place]) 08/23/2013  . Chronic low back pain 08/23/2013  . Constipation 08/23/2013  . Diabetes mellitus due to underlying condition without complications ([place]) 69/40/9828  . 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 Morbid obesity due to excess calories ([place]) 07/19/2013  . Hypothyroid 07/19/2013   PCP:  Antony Blackbird, MD Pharmacy:   [place], [place] - [address] [place] [address] Phone: [phone] Fax: [place], [address] [place] [address] Phone: [phone] Fax: [phone]     Social Determinants of Health (SDOH) Interventions    Readmission Risk Interventions No flowsheet data found.

## 2019-11-18 NOTE — Progress Notes (Signed)
Progress Note    Lindsay Rivera  HKV:[id number] DOB: [birth year]  DOA: 11/17/2019 PCP: Antony Blackbird, MD    Brief Narrative:   Medical records reviewed and are as summarized below:  Lindsay Rivera is an 59 y.o. female with medical history significant of DM; substance abuse; CAD; hypothyroidism; HLD; CVA; COPD; chronic low back pain; chronic diastolic CHF; and bipolar d/o presenting with code stroke.  She is quite somnolent from Ativan from MRI but reports that she fell out of bed a week or two ago and has had intermittent blurry vision since.  She reports that she "can't see" and also had left upper and lower extremity weakness and numbness.  She then burst into tears, bemoaning the deaths of her son and grandson - which she then reports happened 11 years ago.  She then lapses back into deep slumber with snoring.  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  -neurosurgery consult: causing mass effect on the right side of the pons. She will likely need to be treated with radiation therapy as an outpatient. Her case will be discussed at the tumor board this morning.   Vision loss -Patient with previously normal vision exam by Dr. Katy Fitch this year presenting with subacute onset of intermittently blurred vision since she fell out of bed a week or so ago -She presented today for this which was acutely worse as well as left-sided body symptoms -Code stroke was called -Neurology evaluated the patient but did not find her to be a tPA candidate -CT/CTA/perfusion study negative but did show a meningioma -MRI performed and does not show a CVA, but confirms a 12 mm meningioma in the pons -Ophthalmology has also seen the  patient and recommends outpatient f/u -At this time, this appears to be more c/w conversion d/o - but her work up is ongoing -Aspirin has been given to reduce stroke mortality and decrease morbidity -Continue Plavix -Telemetry monitoring -Neurology consult appreciated -PT/OT/ST/Nutrition Consults  HTN, concern for PRES -Markedly elevated BP on presentation -Concern for PRES, despite no findings on imaging -She was started on Cleviprex with rapid improvement and normalization so this was discontinued -Resume Coreg tonight with holding parameters  HLD -Recent lipids, on 9/3: 275/58/19/118 -This begs the question of whether she is actually taking Crestor (see below regarding synthroid) -Continue Crestor -Recheck lipids  DM -Recent A1c shows good control, 5.6 on 9/10 -Hold home PO medication (Metformin) -moderate-scale SSI  AKI -improved with IVF -recheck labs in AM  Hypothyroidism -TSH markedly high >100 -increase synthroid dose and change to IV -? Compliance at home  Chronic pain -I have reviewed this patient in the [place] Controlled Substances Reporting System- patient is not on chronic narcotics which not what was reported by patient who states she in on percocet 5s  Bipolar d/o -There is concern for supratentorial contribution to her presentation -psych consult -Continue Prozac, Seroquel -UDS pending     Family Communication/Anticipated D/C date and plan/Code Status   DVT prophylaxis: Lovenox ordered. Code Status: Full Code.  Family Communication: called family but sent to voicemail, called again and significant other Disposition Plan: Status is: Observation  The patient will require care spanning > 2 midnights and should  be moved to inpatient because: Ongoing diagnostic testing needed not appropriate for outpatient work up  Dispo: The patient is from: Home              Anticipated d/c is to: Home              Anticipated d/c date is: 1 day               Patient currently is not medically stable to d/c.         Medical Consultants:    NS  Neurology  psych     Subjective:   Upset about her charger being lost  Objective:    Vitals:   11/18/19 0930 11/18/19 1000 11/18/19 1030 11/18/19 1211  BP: 109/79 109/67 121/72 106/73  Pulse: (!) 59 61 61 64  Resp: (!) 23 20 14 16   Temp:    98.1 F (36.7 C)  TempSrc:    Oral  SpO2: 98% 98% 98% 100%  Weight:      Height:        Intake/Output Summary (Last 24 hours) at 11/18/2019 1417 Last data filed at 11/17/2019 1654 Gross per 24 hour  Intake 7.16 ml  Output --  Net 7.16 ml   Filed Weights   11/17/19 1100 11/17/19 1333  Weight: 67.4 kg 67.4 kg    Exam:  General: Appearance:     Overweight female in no acute distress     Lungs:      respirations unlabored  Heart:    Normal heart rate. Normal rhythm. No murmurs, rubs, or gallops.   MS:   All extremities are intact.   Neurologic:   Moves all 4 ext, difficult to re-direct from her angst of her missing charger box     Data Reviewed:   I have personally reviewed following labs and imaging studies:  Labs: Labs show the following:   Basic Metabolic Panel: Recent Labs  Lab 11/17/19 1130 11/17/19 1139  NA 140 141  K 3.9 4.2  CL 101 104  CO2 28  --   GLUCOSE 98 94  BUN 17 24*  CREATININE 1.34* 1.20*  CALCIUM 9.6  --    GFR Estimated Creatinine Clearance: 45.4 mL/min (A) (by C-G formula based on SCr of 1.2 mg/dL (H)). Liver Function Tests: Recent Labs  Lab 11/17/19 1130  AST 20  ALT 18  ALKPHOS 91  BILITOT 0.5  PROT 8.0  ALBUMIN 4.1   No results for input(s): LIPASE, AMYLASE in the last 168 hours. No results for input(s): AMMONIA in the last 168 hours. Coagulation profile Recent Labs  Lab 11/17/19 1130  INR 0.9    CBC: Recent Labs  Lab 11/17/19 1130 11/17/19 1139  WBC 12.7*  --   NEUTROABS 9.6*  --   HGB 15.9* 16.7*  HCT 49.6* 49.0*  MCV 96.7  --   PLT 431*  --    Cardiac  Enzymes: No results for input(s): CKTOTAL, CKMB, CKMBINDEX, TROPONINI in the last 168 hours. BNP (last 3 results) No results for input(s): PROBNP in the last 8760 hours. CBG: Recent Labs  Lab 11/17/19 1121 11/17/19 2210 11/18/19 0753  GLUCAP 97 127* 107*   D-Dimer: No results for input(s): DDIMER in the last 72 hours. Hgb A1c: No results for input(s): HGBA1C in the last 72 hours. Lipid Profile: Recent Labs    11/17/19 1819  CHOL 263*  HDL 70  LDLCALC NOT CALCULATED  TRIG 160*  CHOLHDL 3.8   Thyroid function  studies: Recent Labs    11/17/19 1733  TSH 107.055*   Anemia work up: No results for input(s): VITAMINB12, FOLATE, FERRITIN, TIBC, IRON, RETICCTPCT in the last 72 hours. Sepsis Labs: Recent Labs  Lab 11/17/19 1130  WBC 12.7*    Microbiology Recent Results (from the past 240 hour(s))  Respiratory Panel by RT PCR (Flu A&B, Covid) - Nasopharyngeal Swab     Status: None   Collection Time: 11/17/19  4:14 PM   Specimen: Nasopharyngeal Swab  Result Value Ref Range Status   SARS Coronavirus 2 by RT PCR NEGATIVE NEGATIVE Final    Comment: (NOTE) SARS-CoV-2 target nucleic acids are NOT DETECTED.  The SARS-CoV-2 RNA is generally detectable in upper respiratoy specimens during the acute phase of infection. The lowest concentration of SARS-CoV-2 viral copies this assay can detect is 131 copies/mL. A negative result does not preclude SARS-Cov-2 infection and should not be used as the sole basis for treatment or other patient management decisions. A negative result may occur with  improper specimen collection/handling, submission of specimen other than nasopharyngeal swab, presence of viral mutation(s) within the areas targeted by this assay, and inadequate number of viral copies (<131 copies/mL). A negative result must be combined with clinical observations, patient history, and epidemiological information. The expected result is Negative.  Fact Sheet for  Patients:  PinkCheek.be  Fact Sheet for Healthcare Providers:  GravelBags.it  This test is no t yet approved or cleared by the [place] FDA and  has been authorized for detection and/or diagnosis of SARS-CoV-2 by FDA under an Emergency Use Authorization (EUA). This EUA will remain  in effect (meaning this test can be used) for the duration of the COVID-19 declaration under Section 564(b)(1) of the Act, 21 U.S.C. section 360bbb-3(b)(1), unless the authorization is terminated or revoked sooner.     Influenza A by PCR NEGATIVE NEGATIVE Final   Influenza B by PCR NEGATIVE NEGATIVE Final    Comment: (NOTE) The Xpert Xpress SARS-CoV-2/FLU/RSV assay is intended as an aid in  the diagnosis of influenza from Nasopharyngeal swab specimens and  should not be used as a sole basis for treatment. Nasal washings and  aspirates are unacceptable for Xpert Xpress SARS-CoV-2/FLU/RSV  testing.  Fact Sheet for Patients: PinkCheek.be  Fact Sheet for Healthcare Providers: GravelBags.it  This test is not yet approved or cleared by the [place] FDA and  has been authorized for detection and/or diagnosis of SARS-CoV-2 by  FDA under an Emergency Use Authorization (EUA). This EUA will remain  in effect (meaning this test can be used) for the duration of the  Covid-19 declaration under Section 564(b)(1) of the Act, 21  U.S.C. section 360bbb-3(b)(1), unless the authorization is  terminated or revoked. Performed at [facility], [place] [address] [place], [place] [address]     Procedures and diagnostic studies:  CT Code Stroke CTA Head W/WO contrast  Result Date: 11/17/2019 CLINICAL DATA:  Acute onset of visual disturbance. EXAM: CT ANGIOGRAPHY HEAD AND NECK CT PERFUSION BRAIN TECHNIQUE: Multidetector CT imaging of the head and neck was performed using the standard protocol  during bolus administration of intravenous contrast. Multiplanar CT image reconstructions and MIPs were obtained to evaluate the vascular anatomy. Carotid stenosis measurements (when applicable) are obtained utilizing NASCET criteria, using the distal internal carotid diameter as the denominator. Multiphase CT imaging of the brain was performed following IV bolus contrast injection. Subsequent parametric perfusion maps were calculated using RAPID software. CONTRAST:  145mL OMNIPAQUE IOHEXOL 350  MG/ML SOLN COMPARISON:  Head CT earlier same day. FINDINGS: CTA NECK FINDINGS Aortic arch: Mild aortic atherosclerosis. Branching pattern is normal. Right carotid system: Common carotid artery widely patent to the bifurcation. Mild atherosclerotic plaque at the carotid bifurcation but no stenosis. Left carotid system: Common carotid artery widely patent to the bifurcation. Mild atherosclerotic plaque at the carotid bifurcation but no stenosis. Vertebral arteries: Both vertebral artery origins are widely patent. The right is dominant. Both vertebral arteries are normal through the cervical region to the basilar. Skeleton: Ordinary cervical spondylosis. Other neck: Previous thyroidectomy on the right. 11 mm nodule lower pole thyroid on the left. Upper chest: No acute finding. Review of the MIP images confirms the above findings CTA HEAD FINDINGS Anterior circulation: Both internal carotid arteries widely patent through the skull base and siphon regions. The anterior and middle cerebral vessels are normal without proximal stenosis, aneurysm or vascular malformation. Posterior circulation: Both vertebral arteries widely patent to the basilar. No basilar stenosis. Posterior circulation branch vessels are patent. No evidence of PCA occlusion. Venous sinuses: Patent and normal. Anatomic variants: The study confirms the presence of a meningioma along the inferior aspect of the tentorium on the right measuring approximately 10 x 16  mm. Review of the MIP images confirms the above findings CT Brain Perfusion Findings: ASPECTS: 10 CBF (<30%) Volume: 23mL Perfusion (Tmax>6.0s) volume: 36mL Mismatch Volume: 47mL Infarction Location:None IMPRESSION: 1. No acute large or medium vessel occlusion. Negative perfusion study. 2. Mild atherosclerotic change at both carotid bifurcations but without stenosis. 3. Mild aortic atherosclerosis. 4. 10 x 16 mm meningioma along the inferior aspect of the tentorium on the right. 5. Previous thyroidectomy on the right. 11 mm nodule lower pole thyroid on the left. No followup recommended (ref: J Am Coll Radiol. 2015 Feb;12(2): 143-50). Aortic Atherosclerosis (ICD10-I70.0). Electronically Signed   By: Nelson Chimes M.D.   On: 11/17/2019 12:01   CT Code Stroke CTA Neck W/WO contrast  Result Date: 11/17/2019 CLINICAL DATA:  Acute onset of visual disturbance. EXAM: CT ANGIOGRAPHY HEAD AND NECK CT PERFUSION BRAIN TECHNIQUE: Multidetector CT imaging of the head and neck was performed using the standard protocol during bolus administration of intravenous contrast. Multiplanar CT image reconstructions and MIPs were obtained to evaluate the vascular anatomy. Carotid stenosis measurements (when applicable) are obtained utilizing NASCET criteria, using the distal internal carotid diameter as the denominator. Multiphase CT imaging of the brain was performed following IV bolus contrast injection. Subsequent parametric perfusion maps were calculated using RAPID software. CONTRAST:  162mL OMNIPAQUE IOHEXOL 350 MG/ML SOLN COMPARISON:  Head CT earlier same day. FINDINGS: CTA NECK FINDINGS Aortic arch: Mild aortic atherosclerosis. Branching pattern is normal. Right carotid system: Common carotid artery widely patent to the bifurcation. Mild atherosclerotic plaque at the carotid bifurcation but no stenosis. Left carotid system: Common carotid artery widely patent to the bifurcation. Mild atherosclerotic plaque at the carotid  bifurcation but no stenosis. Vertebral arteries: Both vertebral artery origins are widely patent. The right is dominant. Both vertebral arteries are normal through the cervical region to the basilar. Skeleton: Ordinary cervical spondylosis. Other neck: Previous thyroidectomy on the right. 11 mm nodule lower pole thyroid on the left. Upper chest: No acute finding. Review of the MIP images confirms the above findings CTA HEAD FINDINGS Anterior circulation: Both internal carotid arteries widely patent through the skull base and siphon regions. The anterior and middle cerebral vessels are normal without proximal stenosis, aneurysm or vascular malformation. Posterior circulation: Both vertebral arteries widely  patent to the basilar. No basilar stenosis. Posterior circulation branch vessels are patent. No evidence of PCA occlusion. Venous sinuses: Patent and normal. Anatomic variants: The study confirms the presence of a meningioma along the inferior aspect of the tentorium on the right measuring approximately 10 x 16 mm. Review of the MIP images confirms the above findings CT Brain Perfusion Findings: ASPECTS: 10 CBF (<30%) Volume: 17mL Perfusion (Tmax>6.0s) volume: 78mL Mismatch Volume: 28mL Infarction Location:None IMPRESSION: 1. No acute large or medium vessel occlusion. Negative perfusion study. 2. Mild atherosclerotic change at both carotid bifurcations but without stenosis. 3. Mild aortic atherosclerosis. 4. 10 x 16 mm meningioma along the inferior aspect of the tentorium on the right. 5. Previous thyroidectomy on the right. 11 mm nodule lower pole thyroid on the left. No followup recommended (ref: J Am Coll Radiol. 2015 Feb;12(2): 143-50). Aortic Atherosclerosis (ICD10-I70.0). Electronically Signed   By: Nelson Chimes M.D.   On: 11/17/2019 12:01   MR BRAIN W WO CONTRAST  Addendum Date: 11/17/2019   ADDENDUM REPORT: 11/17/2019 17:25 ADDENDUM: Voice recognition error: The first sentence of the impression section  should read "right tentorial meningioma measuring 12.5 x 9.0 x 12.0 mm. " Electronically Signed   By: San Morelle M.D.   On: 11/17/2019 17:25   Result Date: 11/17/2019 CLINICAL DATA:  Acute left-sided vision loss.  Question stroke. EXAM: MRI HEAD WITHOUT AND WITH CONTRAST TECHNIQUE: Multiplanar, multiecho pulse sequences of the brain and surrounding structures were obtained without and with intravenous contrast. CONTRAST:  7.42mL GADAVIST GADOBUTROL 1 MMOL/ML IV SOLN COMPARISON:  None. FINDINGS: Brain: The diffusion-weighted images demonstrate no acute or subacute infarct. Right tentorial meningioma is visualized on the diffusion trace images. Minimal subcortical T2 hyperintensities are within normal limits for age. No acute hemorrhage or mass lesion is present. The ventricles are of normal size. No significant extraaxial fluid collection is present. 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 The lesion has a dural tail and demonstrates confluent enhancement. There is some mass effect on the right side of the pons. No additional dural-based lesions are present. No pathologic parenchymal enhancement is present. Vascular: Flow is present in the major intracranial arteries. Skull and upper cervical spine: The craniocervical junction is normal. Upper cervical spine is within normal limits. Marrow signal is unremarkable. Sinuses/Orbits: The paranasal sinuses and mastoid air cells are clear. The globes and orbits are within normal limits. IMPRESSION: 1. 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 2. There is some mass effect on the right side of the pons. 3. No acute intracranial abnormality. No acute or subacute infarct to explain left-sided vision loss. Electronically Signed: By: San Morelle M.D. On: 11/17/2019 13:53   CT CEREBRAL PERFUSION W CONTRAST  Result Date: 11/17/2019 CLINICAL DATA:  Acute onset of visual disturbance. EXAM: CT ANGIOGRAPHY HEAD  AND NECK CT PERFUSION BRAIN TECHNIQUE: Multidetector CT imaging of the head and neck was performed using the standard protocol during bolus administration of intravenous contrast. Multiplanar CT image reconstructions and MIPs were obtained to evaluate the vascular anatomy. Carotid stenosis measurements (when applicable) are obtained utilizing NASCET criteria, using the distal internal carotid diameter as the denominator. Multiphase CT imaging of the brain was performed following IV bolus contrast injection. Subsequent parametric perfusion maps were calculated using RAPID software. CONTRAST:  138mL OMNIPAQUE IOHEXOL 350 MG/ML SOLN COMPARISON:  Head CT earlier same day. FINDINGS: CTA NECK FINDINGS Aortic arch: Mild aortic atherosclerosis. Branching pattern is normal. Right  carotid system: Common carotid artery widely patent to the bifurcation. Mild atherosclerotic plaque at the carotid bifurcation but no stenosis. Left carotid system: Common carotid artery widely patent to the bifurcation. Mild atherosclerotic plaque at the carotid bifurcation but no stenosis. Vertebral arteries: Both vertebral artery origins are widely patent. The right is dominant. Both vertebral arteries are normal through the cervical region to the basilar. Skeleton: Ordinary cervical spondylosis. Other neck: Previous thyroidectomy on the right. 11 mm nodule lower pole thyroid on the left. Upper chest: No acute finding. Review of the MIP images confirms the above findings CTA HEAD FINDINGS Anterior circulation: Both internal carotid arteries widely patent through the skull base and siphon regions. The anterior and middle cerebral vessels are normal without proximal stenosis, aneurysm or vascular malformation. Posterior circulation: Both vertebral arteries widely patent to the basilar. No basilar stenosis. Posterior circulation branch vessels are patent. No evidence of PCA occlusion. Venous sinuses: Patent and normal. Anatomic variants: The study  confirms the presence of a meningioma along the inferior aspect of the tentorium on the right measuring approximately 10 x 16 mm. Review of the MIP images confirms the above findings CT Brain Perfusion Findings: ASPECTS: 10 CBF (<30%) Volume: 101mL Perfusion (Tmax>6.0s) volume: 75mL Mismatch Volume: 50mL Infarction Location:None IMPRESSION: 1. No acute large or medium vessel occlusion. Negative perfusion study. 2. Mild atherosclerotic change at both carotid bifurcations but without stenosis. 3. Mild aortic atherosclerosis. 4. 10 x 16 mm meningioma along the inferior aspect of the tentorium on the right. 5. Previous thyroidectomy on the right. 11 mm nodule lower pole thyroid on the left. No followup recommended (ref: J Am Coll Radiol. 2015 Feb;12(2): 143-50). Aortic Atherosclerosis (ICD10-I70.0). Electronically Signed   By: Nelson Chimes M.D.   On: 11/17/2019 12:01   CT HEAD CODE STROKE WO CONTRAST  Result Date: 11/17/2019 CLINICAL DATA:  Code stroke.  Left-sided deficits. EXAM: CT HEAD WITHOUT CONTRAST TECHNIQUE: Contiguous axial images were obtained from the base of the skull through the vertex without intravenous contrast. COMPARISON:  12/11/2008 FINDINGS: Brain: Normal appearance without evidence of old or acute infarction, mass lesion, hemorrhage, hydrocephalus or extra-axial collection. Question if there could be a meningioma along the inferior aspect of the tentorium on the right. Vascular: There is atherosclerotic calcification of the major vessels at the base of the brain. Skull: Negative Sinuses/Orbits: Clear/normal Other: None ASPECTS ([place] Stroke Program Early CT Score) - Ganglionic level infarction (caudate, lentiform nuclei, internal capsule, insula, M1-M3 cortex): 7 - Supraganglionic infarction (M4-M6 cortex): 3 Total score (0-10 with 10 being normal): 10 IMPRESSION: 1. No acute finding by CT. Question if there could be a meningioma along the inferior aspect of the tentorium on the right. 2. ASPECTS  is 10. 3. These results were communicated to Dr. Cheral Marker at 11:29 amon 10/10/2021by text page via the [facility] messaging system. Electronically Signed   By: Nelson Chimes M.D.   On: 11/17/2019 11:30    Medications:   . aspirin  325 mg Oral Daily  . carvedilol  3.125 mg Oral BID WC  . clopidogrel  75 mg Oral Daily  . enoxaparin (LOVENOX) injection  40 mg Subcutaneous Q24H  . FLUoxetine  40 mg Oral Daily  . fluticasone  2 spray Each Nare Daily  . insulin aspart  0-15 Units Subcutaneous TID WC  . levothyroxine  112 mcg Oral Q0600  . linaclotide  290 mcg Oral QAC breakfast  . pantoprazole  40 mg Oral BID  . QUEtiapine  300 mg Oral  QHS  . rosuvastatin  20 mg Oral q1800  . sodium chloride flush  10-40 mL Intracatheter Q12H   Continuous Infusions: . sodium chloride 50 mL/hr at 11/18/19 0152     LOS: 0 days   Geradine Girt  [facility]   How to contact the [facility] Attending or Consulting provider [place] or covering provider during after hours [place], for this patient?  1. Check the care team in [facility] and look for a) attending/consulting [facility] provider listed and b) the [facility] team listed 2. Log into [email or web address] and use [place] universal password to access. If you do not have the password, please contact the hospital operator. 3. Locate the [facility] provider you are looking for under [facility] and page to a number that you can be directly reached. 4. If you still have difficulty reaching the provider, please page the [facility] (Director on Call) for the Hospitalists listed on amion for assistance.  11/18/2019, 2:17 PM

## 2019-11-18 NOTE — ED Notes (Addendum)
Pt ask do we have peanut butter, I told pt we do not have peanut butter.. Pt called me out my name, using profanity.
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
## 2019-11-18 NOTE — Progress Notes (Signed)
OT Cancellation Note  Patient Details Name: Lindsay Rivera MRN: [id number] DOB: 04/28/1960   Cancelled Treatment:    Reason Eval/Treat Not Completed: Other (comment). Pt in process of being transported to a room on 3w, will see her at a later time for eval.  Golden Circle, OTR/L Acute Rehab Services Pager [phone] Office [phone]     Almon Register 11/18/2019, 12:06 PM

## 2019-11-18 NOTE — Progress Notes (Signed)
RT set up and placed patient on CPAP. Auto titrate(max, 18.0, mi 6.0 cm H20) 21% FIO2. Patient tolerating well at this time.

## 2019-11-18 NOTE — Progress Notes (Signed)
Subjective: Patient reports "I still can't see and can't move the left side of my body."   Objective: Vital signs in last 24 hours: Temp:  [97.8 F (36.6 C)-97.9 F (36.6 C)] 97.8 F (36.6 C) (10/10 1604) Pulse Rate:  [56-74] 57 (10/11 0730) Resp:  [8-24] 17 (10/11 0730) BP: (91-218)/(63-131) 106/76 (10/11 0730) SpO2:  [92 %-100 %] 97 % (10/11 0730) Weight:  [67.4 kg] 67.4 kg (10/10 1333)  Intake/Output from previous day: 10/10 0701 - 10/11 0700 In: 7.2 [I.V.:7.2] Out: -  Intake/Output this shift: No intake/output data recorded.  Physical Exam:  Examination continues to be limited due to somnolence and poor patient effort. General:Patient is somnolent and in NAD.  Neurologic:Patient somnolent and then emotionally labilemood and affect. She arouses easily to voice and follows all commands. Disoriented to time, and place. Speech is limited butappropriate. No dysarthria or aphasia. She reports inability to lift left arm or leg.  RUE 4/5 RLE 4/5  LUE 4-/5 LLE 3/5  Cranial Nerves: II: Visual acuity is impaired. Reports inability to discern objects bilaterally. Reports only able to see shadows. III, IV, VI: EOMs intact, no ptosis or nystagmus; pupils equal, round, reactive to light and accommodation. V: Decreased sensation to pinprick and light touch on the left; jaw strength equal bilaterally. VII: Facial muscles intact and symmetric. VIII: Hearing intact IX: Shoulder shrug, head movement intact and equal bilaterally. XII: Tongue protrudes midline, no tremors.   Lab Results: Recent Labs    11/17/19 1130 11/17/19 1139  WBC 12.7*  --   HGB 15.9* 16.7*  HCT 49.6* 49.0*  PLT 431*  --    BMET Recent Labs    11/17/19 1130 11/17/19 1139  NA 140 141  K 3.9 4.2  CL 101 104  CO2 28  --   GLUCOSE 98 94  BUN 17 24*  CREATININE 1.34* 1.20*  CALCIUM 9.6  --     Studies/Results: CT Code Stroke CTA Head W/WO contrast  Result Date: 11/17/2019 CLINICAL DATA:   Acute onset of visual disturbance. EXAM: CT ANGIOGRAPHY HEAD AND NECK CT PERFUSION BRAIN TECHNIQUE: Multidetector CT imaging of the head and neck was performed using the standard protocol during bolus administration of intravenous contrast. Multiplanar CT image reconstructions and MIPs were obtained to evaluate the vascular anatomy. Carotid stenosis measurements (when applicable) are obtained utilizing NASCET criteria, using the distal internal carotid diameter as the denominator. Multiphase CT imaging of the brain was performed following IV bolus contrast injection. Subsequent parametric perfusion maps were calculated using RAPID software. CONTRAST:  182mL OMNIPAQUE IOHEXOL 350 MG/ML SOLN COMPARISON:  Head CT earlier same day. FINDINGS: CTA NECK FINDINGS Aortic arch: Mild aortic atherosclerosis. Branching pattern is normal. Right carotid system: Common carotid artery widely patent to the bifurcation. Mild atherosclerotic plaque at the carotid bifurcation but no stenosis. Left carotid system: Common carotid artery widely patent to the bifurcation. Mild atherosclerotic plaque at the carotid bifurcation but no stenosis. Vertebral arteries: Both vertebral artery origins are widely patent. The right is dominant. Both vertebral arteries are normal through the cervical region to the basilar. Skeleton: Ordinary cervical spondylosis. Other neck: Previous thyroidectomy on the right. 11 mm nodule lower pole thyroid on the left. Upper chest: No acute finding. Review of the MIP images confirms the above findings CTA HEAD FINDINGS Anterior circulation: Both internal carotid arteries widely patent through the skull base and siphon regions. The anterior and middle cerebral vessels are normal without proximal stenosis, aneurysm or vascular malformation. Posterior circulation: Both  vertebral arteries widely patent to the basilar. No basilar stenosis. Posterior circulation branch vessels are patent. No evidence of PCA occlusion.  Venous sinuses: Patent and normal. Anatomic variants: The study confirms the presence of a meningioma along the inferior aspect of the tentorium on the right measuring approximately 10 x 16 mm. Review of the MIP images confirms the above findings CT Brain Perfusion Findings: ASPECTS: 10 CBF (<30%) Volume: 87mL Perfusion (Tmax>6.0s) volume: 10mL Mismatch Volume: 73mL Infarction Location:None IMPRESSION: 1. No acute large or medium vessel occlusion. Negative perfusion study. 2. Mild atherosclerotic change at both carotid bifurcations but without stenosis. 3. Mild aortic atherosclerosis. 4. 10 x 16 mm meningioma along the inferior aspect of the tentorium on the right. 5. Previous thyroidectomy on the right. 11 mm nodule lower pole thyroid on the left. No followup recommended (ref: J Am Coll Radiol. 2015 Feb;12(2): 143-50). Aortic Atherosclerosis (ICD10-I70.0). Electronically Signed   By: Nelson Chimes M.D.   On: 11/17/2019 12:01   CT Code Stroke CTA Neck W/WO contrast  Result Date: 11/17/2019 CLINICAL DATA:  Acute onset of visual disturbance. EXAM: CT ANGIOGRAPHY HEAD AND NECK CT PERFUSION BRAIN TECHNIQUE: Multidetector CT imaging of the head and neck was performed using the standard protocol during bolus administration of intravenous contrast. Multiplanar CT image reconstructions and MIPs were obtained to evaluate the vascular anatomy. Carotid stenosis measurements (when applicable) are obtained utilizing NASCET criteria, using the distal internal carotid diameter as the denominator. Multiphase CT imaging of the brain was performed following IV bolus contrast injection. Subsequent parametric perfusion maps were calculated using RAPID software. CONTRAST:  157mL OMNIPAQUE IOHEXOL 350 MG/ML SOLN COMPARISON:  Head CT earlier same day. FINDINGS: CTA NECK FINDINGS Aortic arch: Mild aortic atherosclerosis. Branching pattern is normal. Right carotid system: Common carotid artery widely patent to the bifurcation. Mild  atherosclerotic plaque at the carotid bifurcation but no stenosis. Left carotid system: Common carotid artery widely patent to the bifurcation. Mild atherosclerotic plaque at the carotid bifurcation but no stenosis. Vertebral arteries: Both vertebral artery origins are widely patent. The right is dominant. Both vertebral arteries are normal through the cervical region to the basilar. Skeleton: Ordinary cervical spondylosis. Other neck: Previous thyroidectomy on the right. 11 mm nodule lower pole thyroid on the left. Upper chest: No acute finding. Review of the MIP images confirms the above findings CTA HEAD FINDINGS Anterior circulation: Both internal carotid arteries widely patent through the skull base and siphon regions. The anterior and middle cerebral vessels are normal without proximal stenosis, aneurysm or vascular malformation. Posterior circulation: Both vertebral arteries widely patent to the basilar. No basilar stenosis. Posterior circulation branch vessels are patent. No evidence of PCA occlusion. Venous sinuses: Patent and normal. Anatomic variants: The study confirms the presence of a meningioma along the inferior aspect of the tentorium on the right measuring approximately 10 x 16 mm. Review of the MIP images confirms the above findings CT Brain Perfusion Findings: ASPECTS: 10 CBF (<30%) Volume: 89mL Perfusion (Tmax>6.0s) volume: 45mL Mismatch Volume: 23mL Infarction Location:None IMPRESSION: 1. No acute large or medium vessel occlusion. Negative perfusion study. 2. Mild atherosclerotic change at both carotid bifurcations but without stenosis. 3. Mild aortic atherosclerosis. 4. 10 x 16 mm meningioma along the inferior aspect of the tentorium on the right. 5. Previous thyroidectomy on the right. 11 mm nodule lower pole thyroid on the left. No followup recommended (ref: J Am Coll Radiol. 2015 Feb;12(2): 143-50). Aortic Atherosclerosis (ICD10-I70.0). Electronically Signed   By: Jan Fireman.D.  On:  11/17/2019 12:01   MR BRAIN W WO CONTRAST  Addendum Date: 11/17/2019   ADDENDUM REPORT: 11/17/2019 17:25 ADDENDUM: Voice recognition error: The first sentence of the impression section should read "right tentorial meningioma measuring 12.5 x 9.0 x 12.0 mm. " Electronically Signed   By: San Morelle M.D.   On: 11/17/2019 17:25   Result Date: 11/17/2019 CLINICAL DATA:  Acute left-sided vision loss.  Question stroke. EXAM: MRI HEAD WITHOUT AND WITH CONTRAST TECHNIQUE: Multiplanar, multiecho pulse sequences of the brain and surrounding structures were obtained without and with intravenous contrast. CONTRAST:  7.72mL GADAVIST GADOBUTROL 1 MMOL/ML IV SOLN COMPARISON:  None. FINDINGS: Brain: The diffusion-weighted images demonstrate no acute or subacute infarct. Right tentorial meningioma is visualized on the diffusion trace images. Minimal subcortical T2 hyperintensities are within normal limits for age. No acute hemorrhage or mass lesion is present. The ventricles are of normal size. No significant extraaxial fluid collection is present. 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 The lesion has a dural tail and demonstrates confluent enhancement. There is some mass effect on the right side of the pons. No additional dural-based lesions are present. No pathologic parenchymal enhancement is present. Vascular: Flow is present in the major intracranial arteries. Skull and upper cervical spine: The craniocervical junction is normal. Upper cervical spine is within normal limits. Marrow signal is unremarkable. Sinuses/Orbits: The paranasal sinuses and mastoid air cells are clear. The globes and orbits are within normal limits. IMPRESSION: 1. 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 2. There is some mass effect on the right side of the pons. 3. No acute intracranial abnormality. No acute or subacute infarct to explain left-sided vision loss. Electronically Signed: By:  San Morelle M.D. On: 11/17/2019 13:53   CT CEREBRAL PERFUSION W CONTRAST  Result Date: 11/17/2019 CLINICAL DATA:  Acute onset of visual disturbance. EXAM: CT ANGIOGRAPHY HEAD AND NECK CT PERFUSION BRAIN TECHNIQUE: Multidetector CT imaging of the head and neck was performed using the standard protocol during bolus administration of intravenous contrast. Multiplanar CT image reconstructions and MIPs were obtained to evaluate the vascular anatomy. Carotid stenosis measurements (when applicable) are obtained utilizing NASCET criteria, using the distal internal carotid diameter as the denominator. Multiphase CT imaging of the brain was performed following IV bolus contrast injection. Subsequent parametric perfusion maps were calculated using RAPID software. CONTRAST:  13mL OMNIPAQUE IOHEXOL 350 MG/ML SOLN COMPARISON:  Head CT earlier same day. FINDINGS: CTA NECK FINDINGS Aortic arch: Mild aortic atherosclerosis. Branching pattern is normal. Right carotid system: Common carotid artery widely patent to the bifurcation. Mild atherosclerotic plaque at the carotid bifurcation but no stenosis. Left carotid system: Common carotid artery widely patent to the bifurcation. Mild atherosclerotic plaque at the carotid bifurcation but no stenosis. Vertebral arteries: Both vertebral artery origins are widely patent. The right is dominant. Both vertebral arteries are normal through the cervical region to the basilar. Skeleton: Ordinary cervical spondylosis. Other neck: Previous thyroidectomy on the right. 11 mm nodule lower pole thyroid on the left. Upper chest: No acute finding. Review of the MIP images confirms the above findings CTA HEAD FINDINGS Anterior circulation: Both internal carotid arteries widely patent through the skull base and siphon regions. The anterior and middle cerebral vessels are normal without proximal stenosis, aneurysm or vascular malformation. Posterior circulation: Both vertebral arteries widely  patent to the basilar. No basilar stenosis. Posterior circulation branch vessels are patent. No evidence of PCA occlusion. Venous sinuses:  Patent and normal. Anatomic variants: The study confirms the presence of a meningioma along the inferior aspect of the tentorium on the right measuring approximately 10 x 16 mm. Review of the MIP images confirms the above findings CT Brain Perfusion Findings: ASPECTS: 10 CBF (<30%) Volume: 18mL Perfusion (Tmax>6.0s) volume: 13mL Mismatch Volume: 33mL Infarction Location:None IMPRESSION: 1. No acute large or medium vessel occlusion. Negative perfusion study. 2. Mild atherosclerotic change at both carotid bifurcations but without stenosis. 3. Mild aortic atherosclerosis. 4. 10 x 16 mm meningioma along the inferior aspect of the tentorium on the right. 5. Previous thyroidectomy on the right. 11 mm nodule lower pole thyroid on the left. No followup recommended (ref: J Am Coll Radiol. 2015 Feb;12(2): 143-50). Aortic Atherosclerosis (ICD10-I70.0). Electronically Signed   By: Nelson Chimes M.D.   On: 11/17/2019 12:01   CT HEAD CODE STROKE WO CONTRAST  Result Date: 11/17/2019 CLINICAL DATA:  Code stroke.  Left-sided deficits. EXAM: CT HEAD WITHOUT CONTRAST TECHNIQUE: Contiguous axial images were obtained from the base of the skull through the vertex without intravenous contrast. COMPARISON:  12/11/2008 FINDINGS: Brain: Normal appearance without evidence of old or acute infarction, mass lesion, hemorrhage, hydrocephalus or extra-axial collection. Question if there could be a meningioma along the inferior aspect of the tentorium on the right. Vascular: There is atherosclerotic calcification of the major vessels at the base of the brain. Skull: Negative Sinuses/Orbits: Clear/normal Other: None ASPECTS ([place] Stroke Program Early CT Score) - Ganglionic level infarction (caudate, lentiform nuclei, internal capsule, insula, M1-M3 cortex): 7 - Supraganglionic infarction (M4-M6 cortex): 3  Total score (0-10 with 10 being normal): 10 IMPRESSION: 1. No acute finding by CT. Question if there could be a meningioma along the inferior aspect of the tentorium on the right. 2. ASPECTS is 10. 3. These results were communicated to Dr. Cheral Marker at 11:29 amon 10/10/2021by text page via the [facility] messaging system. Electronically Signed   By: Nelson Chimes M.D.   On: 11/17/2019 11:30    Assessment/Plan: Patient remains in the ED and is awaiting transfer to the floor. She expressed the desire to be discharged even though she is still has complaints of vision loss and left hemiparesis. Examination os greatly limited due to poor patient effort, concentration, and somnolence. MRI brain was remarkable for right tentorial meningioma measuring 12.5 x 9.0 x 12.0 mm that is causing mass effect on the right side of the pons. She will likely need to be treated with radiation therapy as an outpatient. Her case will be discussed at the tumor board this morning.    LOS: 0 days     Lindsay Rivera 11/18/2019, 8:32 AM

## 2019-11-18 NOTE — Progress Notes (Signed)
Patient found white charger base in bed with her after she went to the bathroom. Aided by this nurse but required little assisted.

## 2019-11-18 NOTE — Progress Notes (Signed)
Patient's wife is "[place]" [name]  Son is Raquel Sarna and [name] "JJ".  Patient only wants Joycelyn Schmid to have information.  Do not give Maryjean Morn information.

## 2019-11-18 NOTE — Progress Notes (Signed)
RT set up CPAP and placed on patient. Patient tolerating CPAP well at this time. RT will continue to monitor as needed.

## 2019-11-18 NOTE — Progress Notes (Signed)
Patient was transported to 3w10. Patient was alert and oriented. Able to move arms and legs at the time of arrival. Patient has an inventory of cell phone, cell phone charger cord, glasses, glasses case, mask and a quarter. At the time of assessment patient could not move left side arm, leg and states she cannot see out of left eye. Patient was able to reach with left hand to grab call bell and see water jug. Patient was unable to state date, time, president and correct color. She stated that she had a charger base in the ED that was white and was now missing. Called Ryan her ED RN and he stated that it was not down there and he would look for it but doesn't remember her having one. Patient has call bell within reach, side rails up X2, bed in the lowest position, will continue to monitor.

## 2019-11-18 NOTE — Plan of Care (Signed)

## 2019-11-18 NOTE — Progress Notes (Signed)
OT Cancellation Note  Patient Details Name: Lindsay Rivera MRN: [id number] DOB: [birth year]   Cancelled Treatment:    Reason Eval/Treat Not Completed: Other (comment). Pt just recently got back from the bathroom (+2 staff to walk her to bathroom and a friend in room got her back to bed by herself). Pt reports her back and side is in too much pain to work with therapy right now. Will re-try tomorrow.  Golden Circle, OTR/L Acute Rehab Services Pager [phone] Office [phone]     Almon Register 11/18/2019, 2:40 PM

## 2019-11-19 DIAGNOSIS — F331 Major depressive disorder, recurrent, moderate: Secondary | ICD-10-CM

## 2019-11-19 DIAGNOSIS — F1910 Other psychoactive substance abuse, uncomplicated: Secondary | ICD-10-CM

## 2019-11-19 LAB — CBC
HCT: 39.2 % (ref 36.0–46.0)
Hemoglobin: 12.4 g/dL (ref 12.0–15.0)
MCH: 30.3 pg (ref 26.0–34.0)
MCHC: 31.6 g/dL (ref 30.0–36.0)
MCV: 95.8 fL (ref 80.0–100.0)
Platelets: 332 10*3/uL (ref 150–400)
RBC: 4.09 MIL/uL (ref 3.87–5.11)
RDW: 16.6 % — ABNORMAL HIGH (ref 11.5–15.5)
WBC: 9.1 10*3/uL (ref 4.0–10.5)
nRBC: 0 % (ref 0.0–0.2)

## 2019-11-19 LAB — RAPID URINE DRUG SCREEN, HOSP PERFORMED
Amphetamines: NOT DETECTED
Barbiturates: NOT DETECTED
Benzodiazepines: POSITIVE — AB
Cocaine: POSITIVE — AB
Opiates: NOT DETECTED
Tetrahydrocannabinol: POSITIVE — AB

## 2019-11-19 LAB — GLUCOSE, CAPILLARY
Glucose-Capillary: 115 mg/dL — ABNORMAL HIGH (ref 70–99)
Glucose-Capillary: 92 mg/dL (ref 70–99)

## 2019-11-19 LAB — BASIC METABOLIC PANEL
Anion gap: 10 (ref 5–15)
BUN: 16 mg/dL (ref 6–20)
CO2: 24 mmol/L (ref 22–32)
Calcium: 8.6 mg/dL — ABNORMAL LOW (ref 8.9–10.3)
Chloride: 104 mmol/L (ref 98–111)
Creatinine, Ser: 0.97 mg/dL (ref 0.44–1.00)
GFR, Estimated: >60 mL/min (ref >60)
Glucose, Bld: 105 mg/dL — ABNORMAL HIGH (ref 70–99)
Potassium: 3.9 mmol/L (ref 3.5–5.1)
Sodium: 138 mmol/L (ref 135–145)

## 2019-11-19 MED ORDER — QUETIAPINE FUMARATE 100 MG PO TABS
100.0000 mg | ORAL_TABLET | Freq: Every day | ORAL | 0 refills | Status: DC
Start: 2019-11-19 — End: 2019-12-02

## 2019-11-19 MED ORDER — FLUOXETINE HCL 20 MG PO CAPS: 20.0000 mg | ORAL_CAPSULE | Freq: Every day | ORAL | Status: DC

## 2019-11-19 MED ORDER — FLUOXETINE HCL 20 MG PO TABS: 20.0000 mg | ORAL_TABLET | Freq: Every day | ORAL | 0 refills | Status: DC

## 2019-11-19 MED ORDER — QUETIAPINE FUMARATE 100 MG PO TABS: 100.0000 mg | ORAL_TABLET | Freq: Every day | ORAL | Status: DC

## 2019-11-19 MED ORDER — DICLOFENAC SODIUM 1 % EX GEL: 2.0000 g | Freq: Four times a day (QID) | CUTANEOUS | Status: DC

## 2019-11-19 MED ORDER — DICLOFENAC SODIUM 1 % EX GEL
2.0000 g | Freq: Four times a day (QID) | CUTANEOUS | Status: DC
  Administered 2019-11-19: 17:00:00 2 g via TOPICAL
  Filled 2019-11-19: qty 100

## 2019-11-19 NOTE — Plan of Care (Signed)
Adequate for DC.
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
## 2019-11-19 NOTE — Progress Notes (Signed)
Hope for d/c after psych consult.  Neurology and Neurosurgery have already signed off. JV

## 2019-11-19 NOTE — Plan of Care (Signed)
Adequate for discharge.
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
## 2019-11-19 NOTE — Progress Notes (Signed)
NEUROLOGY CONSULTATION PROGRESS NOTE   Date of service: November 19, 2019 Patient Name: Lindsay Rivera MRN:  [id number] DOB:  12-25-1960  Brief HPI   Briefly, Lindsay Rivera is a 59 y.o. female with acute onset of left sided weakness and bilateral vision loss with vertigo. CTH, CTA, CTP does not explain her presentation. 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 There is some mass effect on the right side of the pons. MRI still does not explain her presentation. Seen by optho with no clear occular abnormality.  Initial concern for PRES given significant HTN at the time of presentation. MRI negative for PRES thou. No Methanol on blood tox screen.   Interval Hx   Stood at the door and noted her using both her hands to get her glassess off and then sit up in the bed. On my evaluation, endorses that she can't move her left arm at all. Reports that her vision is back. Crying and appears overwhelmed. On discussing the noted emotional lability with her, she reports that she follows outpatient for her depression and bipolar. However, has not been able to see her counselor for the last few months due to covid.   Vitals   Vitals:   11/18/19 1030 11/18/19 1211 11/18/19 1638 11/18/19 2108  BP: 121/72 106/73 127/69 127/84  Pulse: 61 64 (!) 59 62  Resp: 14 16 20 18   Temp:  98.1 F (36.7 C) (!) 97.5 F (36.4 C) 97.6 F (36.4 C)  TempSrc:  Oral Oral Oral  SpO2: 98% 100% 98% 100%  Weight:      Height:         Body mass index is 27.18 kg/m.  Physical Exam    Neurologic Examination  Mental status/Cognition: Alert, oriented to self, place, month and year, good attention. Speech/language: Fluent, comprehension intact, object naming intact, repetition intact. Cranial nerves:   CN II Pupils equal and reactive to light, no VF deficits   CN III,IV,VI EOM intact, no gaze preference or deviation, no nystagmus   CN V Decreased sensation in left to touch   CN  VII no asymmetry, no nasolabial fold flattening   CN VIII normal hearing to speech   CN IX & X normal palatal elevation, no uvular deviation   CN XI 5/5 head turn and 5/5 shoulder shrug bilaterally   CN XII midline tongue protrusion   Motor:  Muscle bulk: normal, tone normal. 5/5 in RUE and 4/5 in LUE. 5/5 in RLE and 4/5 in LLE. Frequently gives away on testing strength in extremities.  Reflexes:  Right Left Comments  Pectoralis      Biceps (C5/6) 1 1   Brachioradialis (C5/6) 1 1    Triceps (C6/7) 1 1    Patellar (L3/4) 1 1    Achilles (S1) 1 1    Hoffman      Plantar withdraws withraws   Jaw jerk    Sensation:  Light touch Decreased in LUE and LLE.   Pin prick    Temperature    Vibration   Proprioception    Coordination/Complex Motor:  - Finger to Nose with no obvious ataxia observed. althou arm does flail.  Labs   Basic Metabolic Panel:  Lab Results  Component Value Date   NA 138 11/19/2019   K 3.9 11/19/2019   CO2 24 11/19/2019   GLUCOSE 105 (H) 11/19/2019   BUN 16 11/19/2019   CREATININE 0.97 11/19/2019   CALCIUM  8.6 (L) 11/19/2019   GFRNONAA >60 11/19/2019   GFRAA 75 10/11/2019   HbA1c:  Lab Results  Component Value Date   HGBA1C 5.6 10/18/2019   LDL:  Lab Results  Component Value Date   LDLCALC NOT CALCULATED 11/17/2019   Urine Drug Screen:     Component Value Date/Time   LABOPIA NEG 12/26/2013 1324   LABOPIA NONE DETECTED 01/13/2009 1137   COCAINSCRNUR NEG 12/26/2013 1324   LABBENZ PPS 12/26/2013 1324   LABBENZ NEGATIVE 07/19/2013 1455   AMPHETMU NEG 12/26/2013 1324   AMPHETMU NONE DETECTED 01/13/2009 1137   THCU NEG 12/26/2013 1324   THCU POSITIVE (A) 01/13/2009 1137   LABBARB NEG 12/26/2013 1324   LABBARB  01/13/2009 1137    NONE DETECTED        DRUG SCREEN FOR MEDICAL PURPOSES ONLY.  IF CONFIRMATION IS NEEDED FOR ANY PURPOSE, NOTIFY LAB WITHIN 5 DAYS.        LOWEST DETECTABLE LIMITS FOR URINE DRUG SCREEN Drug Class       Cutoff  (ng/mL) Amphetamine      1000 Barbiturate      200 Benzodiazepine   720 Tricyclics       947 Opiates          300 Cocaine          300 THC              50    Alcohol Level     Component Value Date/Time   [facility]  01/13/2009 1155    <5        LOWEST DETECTABLE LIMIT FOR SERUM ALCOHOL IS 5 mg/dL FOR MEDICAL PURPOSES ONLY   No results found for: PHENYTOIN, ZONISAMIDE, LAMOTRIGINE, LEVETIRACETA No results found for: PHENYTOIN, PHENOBARB, VALPROATE, CBMZ  Imaging and Diagnostic studies  Results for orders placed during the hospital encounter of 11/17/19 CTA NECK FINDINGS  Aortic arch: Mild aortic atherosclerosis. Branching pattern is normal.  Right carotid system: Common carotid artery widely patent to the bifurcation. Mild atherosclerotic plaque at the carotid bifurcation but no stenosis.  Left carotid system: Common carotid artery widely patent to the bifurcation. Mild atherosclerotic plaque at the carotid bifurcation but no stenosis.  Vertebral arteries: Both vertebral artery origins are widely patent. The right is dominant. Both vertebral arteries are normal through the cervical region to the basilar.  Skeleton: Ordinary cervical spondylosis.  Other neck: Previous thyroidectomy on the right. 11 mm nodule lower pole thyroid on the left.  Upper chest: No acute finding.  Review of the MIP images confirms the above findings  CTA HEAD FINDINGS  Anterior circulation: Both internal carotid arteries widely patent through the skull base and siphon regions. The anterior and middle cerebral vessels are normal without proximal stenosis, aneurysm or vascular malformation.  Posterior circulation: Both vertebral arteries widely patent to the basilar. No basilar stenosis. Posterior circulation branch vessels are patent. No evidence of PCA occlusion.  Venous sinuses: Patent and normal.  Anatomic variants: The study confirms the presence of a meningioma along the inferior  aspect of the tentorium on the right measuring approximately 10 x 16 mm.  Review of the MIP images confirms the above findings  CT Brain Perfusion Findings:  ASPECTS: 10  CBF (<30%) Volume: 74mL  Perfusion (Tmax>6.0s) volume: 66mL  Mismatch Volume: 44mL  Infarction Location:None  IMPRESSION: 1. No acute large or medium vessel occlusion. Negative perfusion study. 2. Mild atherosclerotic change at both carotid bifurcations but without stenosis. 3. Mild aortic atherosclerosis. 4. 10  x 16 mm meningioma along the inferior aspect of the tentorium on the right. 5. Previous thyroidectomy on the right. 11 mm nodule lower pole thyroid on the left. No followup recommended (ref: J Am Coll Radiol. 2015 Feb;12(2): 143-50).  Aortic Atherosclerosis (ICD10-I70.0).  MRI Brain w and w/o contrast: IMPRESSION: 1. 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 2. There is some mass effect on the right side of the pons. 3. No acute intracranial abnormality. No acute or subacute infarct to explain left-sided vision loss.  Impression   Lindsay Rivera is a 59 y.o. female with acute onset L sided weakness and BL vision loss. Vision is back to normal. With the inconsistencies noted on examination of the left side, I suspect that this is functional. Her workup so far has been non revealing. I do not suspect that this is PRES given negative imaging findings. I do not suspect that this is coming from her spine as she had vision deficit at the same time as the left sided weakness and the vision would not localize to her spine.  Recommendations  - No further inpatient neurology workup recommended. Neurology inpatient team will signoff. Please feel free to contact us with any questions or concerns. My suspicion is that this is functional and patient would benefit from being linked with her outpatient therapist and  Psychiatrist. ______________________________________________________________________   Thank you for the opportunity to take part in the care of this patient. If you have any further questions, please contact the neurology consultation attending.  Signed,  [place] Pager Number [phone]
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
## 2019-11-19 NOTE — Progress Notes (Signed)
Pt received discharge instructions. Pt asked for new Prozac prescription, MD was notified and new prescription was sent to pt's pharmacy. Pt was provided with information about addiction and resource for mental health in the community. Pt does not have any other concerns or questions at this time.

## 2019-11-19 NOTE — Discharge Summary (Signed)
Physician Discharge Summary  CHENITA RUDA TAV:[id number] DOB: October 28, 1960 DOA: 11/17/2019  PCP: Antony Blackbird, MD  Admit date: 11/17/2019 Discharge date: 11/19/2019  Admitted From: home Discharge disposition: home   Recommendations for Outpatient Follow-Up:   1. Cocaine and marijuana cessation 2. Outpatient psych follow up 3. Outpatient NS follow up for meningioma  4. TSH 3-4 weeks   Discharge Diagnosis:   Principal Problem:   Vision loss Active Problems:   Hypothyroid   Bipolar disorder ([place])   Chronic low back pain   Diabetes mellitus due to underlying condition without complications (HCC)   Essential hypertension   Hyperlipidemia   Polysubstance abuse ([place])   Major depressive disorder, recurrent episode, moderate degree ([place])    Discharge Condition: Improved.  Diet recommendation: Low sodium, heart healthy.  Carbohydrate-modified  Wound care: None.  Code status: Full.   History of Present Illness:   Lindsay Rivera is an 59 y.o. female with medical history significant ofDM; substance abuse; CAD; hypothyroidism; HLD; CVA; COPD; chronic low back pain; chronic diastolic CHF; and bipolar d/o presenting with code stroke.She is quite somnolent from Ativan from MRI but reports that she fell out of bed a week or two ago and has had intermittent blurry vision since. She reports that she "can't see" and also had left upper and lower extremity weakness and numbness. She then burst into tears, bemoaning the deaths of her son and grandson - which she then reports happened 11 years ago. She then lapses back into deep slumber with snoring.   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  -neurosurgery consult: The patient has a functional, highly variable neurologic exam.  I do not believe she has significant weakness and her visual complaints are also highly variable.  These may require psychiatric consultation and  will likely resolve with supportive care.  Her meningioma was found incidentally and will need follow up.  It is causing compression of her pons and should, at some point, be treated.  I presented her case at our multi-disciplinary brain tumor conference.  I told the patient that I will meet with her as an outpatient to review her options.  There are three reasonable options: serial imaging without intervention followed by intervention if demonstrated tumor growth; stereotactic radiosurgery in five fractions; surgical resection of this tumor.  Each of these options have risks and benefits to them and I will attempt to counsel the patient in an effort to help her select the best option, given her other circumstances and complaints.  At this point, I would recommend follow up with me as an outpatient after her acute hospitalization has need.  Vision loss -Patient with previously normal vision exam by Dr. Katy Fitch this year presenting with subacute onset of intermittently blurred vision since she fell out of bed a week or so ago -She presented today for this which was acutely worse as well as left-sided body symptoms -Code stroke was called -Neurology evaluated the patient but did not find her to be a tPA candidate -CT/CTA/perfusion study negative but did show a meningioma -MRI performed and does not show a CVA, but confirms a 12 mm meningioma in the pons -Ophthalmology has also seen the patient and recommends outpatient f/u -At this time, this appears to be more c/w conversion d/o - but her work up is ongoing -Continue Plavix -Telemetry monitoring -Neurology consult appreciated- no further work up -PT/OT/ST/Nutrition Consults  HTN, concern  for PRES -Markedly elevated BP on presentation -Concern for PRES, despite no findings on imaging -She was started on Cleviprex with rapid improvement and normalization so this was discontinued -Resume Coreg   Polysubstance abuse -encouraged  cessation  HLD -Recent lipids, on 9/3: 275/58/19/118 -This begs the question of whether she is actually taking Crestor (see below regarding synthroid) -Continue Crestor -Recheck lipids  DM -Recent A1c shows good control, 5.6 on 9/10 -resume home meds  AKI -improved with IVF  Hypothyroidism -TSH markedly high >100  change to IV for 2 doses -patient had run out at home   Chronic pain -I have reviewed this patient in the [place] Controlled Substances Reporting System- patient is not on chronic narcotics which not what was reported by patient who states she in on percocet 5s  Bipolar d/o -There is concern for supratentorial contribution to her presentation -psych consult:Major Depression Disorder, Recurrent, Moderate -Restart Prozac 20 mg daily -Restart Seroquel 100 mg QHS -Follow up outpatient therapy once medically cleared from hospital at [facility] [place] Consultants:   Neurology NS psych   Discharge Exam:   Vitals:   11/19/19 0835 11/19/19 1212  BP: (!) 134/102 119/76  Pulse: 62 (!) 59  Resp: 18 18  Temp: 98.2 F (36.8 C) 98.1 F (36.7 C)  SpO2: 100% 100%   Vitals:   11/18/19 1638 11/18/19 2108 11/19/19 0835 11/19/19 1212  BP: 127/69 127/84 (!) 134/102 119/76  Pulse: (!) 59 62 62 (!) 59  Resp: _0 Temp: (!) 97.5 F (36.4 C) 97.6 F (36.4 C) 98.2 F (36.8 C) 98.1 F (36.7 C)  TempSrc: Oral Oral    SpO2: 98% 100% 100% 100%  Weight:      Height:        General exam: Appears calm and comfortable. Tearful at times.    The results of significant diagnostics from this hospitalization (including imaging, microbiology, ancillary and laboratory) are listed below for reference.     Procedures and Diagnostic Studies:   CT Code Stroke CTA Head W/WO contrast  Result Date: 11/17/2019 CLINICAL DATA:  Acute onset of visual disturbance. EXAM: CT ANGIOGRAPHY HEAD AND NECK CT PERFUSION BRAIN TECHNIQUE: Multidetector  CT imaging of the head and neck was performed using the standard protocol during bolus administration of intravenous contrast. Multiplanar CT image reconstructions and MIPs were obtained to evaluate the vascular anatomy. Carotid stenosis measurements (when applicable) are obtained utilizing NASCET criteria, using the distal internal carotid diameter as the denominator. Multiphase CT imaging of the brain was performed following IV bolus contrast injection. Subsequent parametric perfusion maps were calculated using RAPID software. CONTRAST:  171m OMNIPAQUE IOHEXOL 350 MG/ML SOLN COMPARISON:  Head CT earlier same day. FINDINGS: CTA NECK FINDINGS Aortic arch: Mild aortic atherosclerosis. Branching pattern is normal. Right carotid system: Common carotid artery widely patent to the bifurcation. Mild atherosclerotic plaque at the carotid bifurcation but no stenosis. Left carotid system: Common carotid artery widely patent to the bifurcation. Mild atherosclerotic plaque at the carotid bifurcation but no stenosis. Vertebral arteries: Both vertebral artery origins are widely patent. The right is dominant. Both vertebral arteries are normal through the cervical region to the basilar. Skeleton: Ordinary cervical spondylosis. Other neck: Previous thyroidectomy on the right. 11 mm nodule lower pole thyroid on the left. Upper chest: No acute finding. Review of the MIP images confirms the above findings CTA HEAD FINDINGS Anterior circulation: Both internal carotid arteries widely patent through the skull base  and siphon regions. The anterior and middle cerebral vessels are normal without proximal stenosis, aneurysm or vascular malformation. Posterior circulation: Both vertebral arteries widely patent to the basilar. No basilar stenosis. Posterior circulation branch vessels are patent. No evidence of PCA occlusion. Venous sinuses: Patent and normal. Anatomic variants: The study confirms the presence of a meningioma along the  inferior aspect of the tentorium on the right measuring approximately 10 x 16 mm. Review of the MIP images confirms the above findings CT Brain Perfusion Findings: ASPECTS: 10 CBF (<30%) Volume: 6m Perfusion (Tmax>6.0s) volume: 041mMismatch Volume: 56m29mnfarction Location:None IMPRESSION: 1. No acute large or medium vessel occlusion. Negative perfusion study. 2. Mild atherosclerotic change at both carotid bifurcations but without stenosis. 3. Mild aortic atherosclerosis. 4. 10 x 16 mm meningioma along the inferior aspect of the tentorium on the right. 5. Previous thyroidectomy on the right. 11 mm nodule lower pole thyroid on the left. No followup recommended (ref: J Am Coll Radiol. 2015 Feb;12(2): 143-50). Aortic Atherosclerosis (ICD10-I70.0). Electronically Signed   By: MarNelson ChimesD.   On: 11/17/2019 12:01   CT Code Stroke CTA Neck W/WO contrast  Result Date: 11/17/2019 CLINICAL DATA:  Acute onset of visual disturbance. EXAM: CT ANGIOGRAPHY HEAD AND NECK CT PERFUSION BRAIN TECHNIQUE: Multidetector CT imaging of the head and neck was performed using the standard protocol during bolus administration of intravenous contrast. Multiplanar CT image reconstructions and MIPs were obtained to evaluate the vascular anatomy. Carotid stenosis measurements (when applicable) are obtained utilizing NASCET criteria, using the distal internal carotid diameter as the denominator. Multiphase CT imaging of the brain was performed following IV bolus contrast injection. Subsequent parametric perfusion maps were calculated using RAPID software. CONTRAST:  1056m21mNIPAQUE IOHEXOL 350 MG/ML SOLN COMPARISON:  Head CT earlier same day. FINDINGS: CTA NECK FINDINGS Aortic arch: Mild aortic atherosclerosis. Branching pattern is normal. Right carotid system: Common carotid artery widely patent to the bifurcation. Mild atherosclerotic plaque at the carotid bifurcation but no stenosis. Left carotid system: Common carotid artery widely  patent to the bifurcation. Mild atherosclerotic plaque at the carotid bifurcation but no stenosis. Vertebral arteries: Both vertebral artery origins are widely patent. The right is dominant. Both vertebral arteries are normal through the cervical region to the basilar. Skeleton: Ordinary cervical spondylosis. Other neck: Previous thyroidectomy on the right. 11 mm nodule lower pole thyroid on the left. Upper chest: No acute finding. Review of the MIP images confirms the above findings CTA HEAD FINDINGS Anterior circulation: Both internal carotid arteries widely patent through the skull base and siphon regions. The anterior and middle cerebral vessels are normal without proximal stenosis, aneurysm or vascular malformation. Posterior circulation: Both vertebral arteries widely patent to the basilar. No basilar stenosis. Posterior circulation branch vessels are patent. No evidence of PCA occlusion. Venous sinuses: Patent and normal. Anatomic variants: The study confirms the presence of a meningioma along the inferior aspect of the tentorium on the right measuring approximately 10 x 16 mm. Review of the MIP images confirms the above findings CT Brain Perfusion Findings: ASPECTS: 10 CBF (<30%) Volume: 56mL 62mfusion (Tmax>6.0s) volume: 56mL M76match Volume: 56mL In97mction Location:None IMPRESSION: 1. No acute large or medium vessel occlusion. Negative perfusion study. 2. Mild atherosclerotic change at both carotid bifurcations but without stenosis. 3. Mild aortic atherosclerosis. 4. 10 x 16 mm meningioma along the inferior aspect of the tentorium on the right. 5. Previous thyroidectomy on the right. 11 mm nodule lower pole thyroid on the left. No followup recommended (  ref: J Am Coll Radiol. 2015 Feb;12(2): 143-50). Aortic Atherosclerosis (ICD10-I70.0). Electronically Signed   By: Nelson Chimes M.D.   On: 11/17/2019 12:01   MR BRAIN W WO CONTRAST  Addendum Date: 11/17/2019   ADDENDUM REPORT: 11/17/2019 17:25 ADDENDUM:  Voice recognition error: The first sentence of the impression section should read "right tentorial meningioma measuring 12.5 x 9.0 x 12.0 mm. " Electronically Signed   By: San Morelle M.D.   On: 11/17/2019 17:25   Result Date: 11/17/2019 CLINICAL DATA:  Acute left-sided vision loss.  Question stroke. EXAM: MRI HEAD WITHOUT AND WITH CONTRAST TECHNIQUE: Multiplanar, multiecho pulse sequences of the brain and surrounding structures were obtained without and with intravenous contrast. CONTRAST:  7.54m GADAVIST GADOBUTROL 1 MMOL/ML IV SOLN COMPARISON:  None. FINDINGS: Brain: The diffusion-weighted images demonstrate no acute or subacute infarct. Right tentorial meningioma is visualized on the diffusion trace images. Minimal subcortical T2 hyperintensities are within normal limits for age. No acute hemorrhage or mass lesion is present. The ventricles are of normal size. No significant extraaxial fluid collection is present. 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 The lesion has a dural tail and demonstrates confluent enhancement. There is some mass effect on the right side of the pons. No additional dural-based lesions are present. No pathologic parenchymal enhancement is present. Vascular: Flow is present in the major intracranial arteries. Skull and upper cervical spine: The craniocervical junction is normal. Upper cervical spine is within normal limits. Marrow signal is unremarkable. Sinuses/Orbits: The paranasal sinuses and mastoid air cells are clear. The globes and orbits are within normal limits. IMPRESSION: 1. 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 2. There is some mass effect on the right side of the pons. 3. No acute intracranial abnormality. No acute or subacute infarct to explain left-sided vision loss. Electronically Signed: By: CSan MorelleM.D. On: 11/17/2019 13:53   CT CEREBRAL PERFUSION W CONTRAST  Result Date:  11/17/2019 CLINICAL DATA:  Acute onset of visual disturbance. EXAM: CT ANGIOGRAPHY HEAD AND NECK CT PERFUSION BRAIN TECHNIQUE: Multidetector CT imaging of the head and neck was performed using the standard protocol during bolus administration of intravenous contrast. Multiplanar CT image reconstructions and MIPs were obtained to evaluate the vascular anatomy. Carotid stenosis measurements (when applicable) are obtained utilizing NASCET criteria, using the distal internal carotid diameter as the denominator. Multiphase CT imaging of the brain was performed following IV bolus contrast injection. Subsequent parametric perfusion maps were calculated using RAPID software. CONTRAST:  1063mOMNIPAQUE IOHEXOL 350 MG/ML SOLN COMPARISON:  Head CT earlier same day. FINDINGS: CTA NECK FINDINGS Aortic arch: Mild aortic atherosclerosis. Branching pattern is normal. Right carotid system: Common carotid artery widely patent to the bifurcation. Mild atherosclerotic plaque at the carotid bifurcation but no stenosis. Left carotid system: Common carotid artery widely patent to the bifurcation. Mild atherosclerotic plaque at the carotid bifurcation but no stenosis. Vertebral arteries: Both vertebral artery origins are widely patent. The right is dominant. Both vertebral arteries are normal through the cervical region to the basilar. Skeleton: Ordinary cervical spondylosis. Other neck: Previous thyroidectomy on the right. 11 mm nodule lower pole thyroid on the left. Upper chest: No acute finding. Review of the MIP images confirms the above findings CTA HEAD FINDINGS Anterior circulation: Both internal carotid arteries widely patent through the skull base and siphon regions. The anterior and middle cerebral vessels are normal without proximal stenosis, aneurysm or vascular malformation. Posterior circulation: Both vertebral arteries  widely patent to the basilar. No basilar stenosis. Posterior circulation branch vessels are patent. No  evidence of PCA occlusion. Venous sinuses: Patent and normal. Anatomic variants: The study confirms the presence of a meningioma along the inferior aspect of the tentorium on the right measuring approximately 10 x 16 mm. Review of the MIP images confirms the above findings CT Brain Perfusion Findings: ASPECTS: 10 CBF (<30%) Volume: 57m Perfusion (Tmax>6.0s) volume: 082mMismatch Volume: 21m89mnfarction Location:None IMPRESSION: 1. No acute large or medium vessel occlusion. Negative perfusion study. 2. Mild atherosclerotic change at both carotid bifurcations but without stenosis. 3. Mild aortic atherosclerosis. 4. 10 x 16 mm meningioma along the inferior aspect of the tentorium on the right. 5. Previous thyroidectomy on the right. 11 mm nodule lower pole thyroid on the left. No followup recommended (ref: J Am Coll Radiol. 2015 Feb;12(2): 143-50). Aortic Atherosclerosis (ICD10-I70.0). Electronically Signed   By: MarNelson ChimesD.   On: 11/17/2019 12:01   CT HEAD CODE STROKE WO CONTRAST  Result Date: 11/17/2019 CLINICAL DATA:  Code stroke.  Left-sided deficits. EXAM: CT HEAD WITHOUT CONTRAST TECHNIQUE: Contiguous axial images were obtained from the base of the skull through the vertex without intravenous contrast. COMPARISON:  12/11/2008 FINDINGS: Brain: Normal appearance without evidence of old or acute infarction, mass lesion, hemorrhage, hydrocephalus or extra-axial collection. Question if there could be a meningioma along the inferior aspect of the tentorium on the right. Vascular: There is atherosclerotic calcification of the major vessels at the base of the brain. Skull: Negative Sinuses/Orbits: Clear/normal Other: None ASPECTS ([place] Program Early CT Score) - Ganglionic level infarction (caudate, lentiform nuclei, internal capsule, insula, M1-M3 cortex): 7 - Supraganglionic infarction (M4-M6 cortex): 3 Total score (0-10 with 10 being normal): 10 IMPRESSION: 1. No acute finding by CT. Question if  there could be a meningioma along the inferior aspect of the tentorium on the right. 2. ASPECTS is 10. 3. These results were communicated to Dr. LinCheral Marker 11:29 amon 10/10/2021by text page via the [facility]aging system. Electronically Signed   By: MarNelson ChimesD.   On: 11/17/2019 11:30     Labs:   Basic Metabolic Panel: Recent Labs  Lab 11/17/19 1130 11/17/19 1130 11/17/19 1139 11/19/19 0316  NA 140  --  141 138  K 3.9   < > 4.2 3.9  CL 101  --  104 104  CO2 28  --   --  24  GLUCOSE 98  --  94 105*  BUN 17  --  24* 16  CREATININE 1.34*  --  1.20* 0.97  CALCIUM 9.6  --   --  8.6*   < > = values in this interval not displayed.   GFR Estimated Creatinine Clearance: 56.2 mL/min (by C-G formula based on SCr of 0.97 mg/dL). Liver Function Tests: Recent Labs  Lab 11/17/19 1130  AST 20  ALT 18  ALKPHOS 91  BILITOT 0.5  PROT 8.0  ALBUMIN 4.1   No results for input(s): LIPASE, AMYLASE in the last 168 hours. No results for input(s): AMMONIA in the last 168 hours. Coagulation profile Recent Labs  Lab 11/17/19 1130  INR 0.9    CBC: Recent Labs  Lab 11/17/19 1130 11/17/19 1139 11/19/19 0316  WBC 12.7*  --  9.1  NEUTROABS 9.6*  --   --   HGB 15.9* 16.7* 12.4  HCT 49.6* 49.0* 39.2  MCV 96.7  --  95.8  PLT 431*  --  332   Cardiac  Enzymes: No results for input(s): CKTOTAL, CKMB, CKMBINDEX, TROPONINI in the last 168 hours. BNP: Invalid input(s): POCBNP CBG: Recent Labs  Lab 11/18/19 0753 11/18/19 1640 11/18/19 2106 11/19/19 0633 11/19/19 1210  GLUCAP 107* 105* 135* 115* 92   D-Dimer No results for input(s): DDIMER in the last 72 hours. Hgb A1c No results for input(s): HGBA1C in the last 72 hours. Lipid Profile Recent Labs    11/17/19 1819  CHOL 263*  HDL 70  LDLCALC NOT CALCULATED  TRIG 160*  CHOLHDL 3.8   Thyroid function studies Recent Labs    11/17/19 1733  TSH 107.055*   Anemia work up No results for input(s): VITAMINB12, FOLATE,  FERRITIN, TIBC, IRON, RETICCTPCT in the last 72 hours. Microbiology Recent Results (from the past 240 hour(s))  Respiratory Panel by RT PCR (Flu A&B, Covid) - Nasopharyngeal Swab     Status: None   Collection Time: 11/17/19  4:14 PM   Specimen: Nasopharyngeal Swab  Result Value Ref Range Status   SARS Coronavirus 2 by RT PCR NEGATIVE NEGATIVE Final    Comment: (NOTE) SARS-CoV-2 target nucleic acids are NOT DETECTED.  The SARS-CoV-2 RNA is generally detectable in upper respiratoy specimens during the acute phase of infection. The lowest concentration of SARS-CoV-2 viral copies this assay can detect is 131 copies/mL. A negative result does not preclude SARS-Cov-2 infection and should not be used as the sole basis for treatment or other patient management decisions. A negative result may occur with  improper specimen collection/handling, submission of specimen other than nasopharyngeal swab, presence of viral mutation(s) within the areas targeted by this assay, and inadequate number of viral copies (<131 copies/mL). A negative result must be combined with clinical observations, patient history, and epidemiological information. The expected result is Negative.  Fact Sheet for Patients:  PinkCheek.be  Fact Sheet for Healthcare Providers:  GravelBags.it  This test is no t yet approved or cleared by the [place] FDA and  has been authorized for detection and/or diagnosis of SARS-CoV-2 by FDA under an Emergency Use Authorization (EUA). This EUA will remain  in effect (meaning this test can be used) for the duration of the COVID-19 declaration under Section 564(b)(1) of the Act, 21 U.S.C. section 360bbb-3(b)(1), unless the authorization is terminated or revoked sooner.     Influenza A by PCR NEGATIVE NEGATIVE Final   Influenza B by PCR NEGATIVE NEGATIVE Final    Comment: (NOTE) The Xpert Xpress SARS-CoV-2/FLU/RSV assay is  intended as an aid in  the diagnosis of influenza from Nasopharyngeal swab specimens and  should not be used as a sole basis for treatment. Nasal washings and  aspirates are unacceptable for Xpert Xpress SARS-CoV-2/FLU/RSV  testing.  Fact Sheet for Patients: PinkCheek.be  Fact Sheet for Healthcare Providers: GravelBags.it  This test is not yet approved or cleared by the [place] FDA and  has been authorized for detection and/or diagnosis of SARS-CoV-2 by  FDA under an Emergency Use Authorization (EUA). This EUA will remain  in effect (meaning this test can be used) for the duration of the  Covid-19 declaration under Section 564(b)(1) of the Act, 21  U.S.C. section 360bbb-3(b)(1), unless the authorization is  terminated or revoked. Performed at [facility], [place] [address], [place], [place] [address]      Discharge Instructions:   Discharge Instructions    Diet - low sodium heart healthy   Complete by: As directed    Discharge instructions   Complete by: As directed  Neurosurgeon Dr. Vertell Limber will follow up with you as an outpatient   Increase activity slowly   Complete by: As directed      Allergies as of 11/19/2019      Reactions   Penicillins Swelling   Has patient had a PCN reaction causing immediate rash, facial/tongue/throat swelling, SOB or lightheadedness with hypotension: No Has patient had a PCN reaction causing severe rash involving mucus membranes or skin necrosis: No Has patient had a PCN reaction that required hospitalization No Has patient had a PCN reaction occurring within the last 10 years: No If all of the above answers are "NO", then may proceed with Cephalosporin use.   Latex Rash      Medication List    STOP taking these medications   potassium chloride 10 MEQ tablet Commonly known as: KLOR-CON     TAKE these medications   albuterol 108 (90 Base) MCG/ACT inhaler Commonly  known as: Ventolin HFA INHALE 2 PUFFS INTO THE LUNGS EVERY 6 HOURS AS NEEDED FOR WHEEZING. What changed:   how much to take  how to take this  when to take this  reasons to take this  additional instructions   Calcium Carbonate-Vitamin D 600-400 MG-UNIT chew tablet Chew 1 tablet by mouth daily. Reported on 03/25/2015   carvedilol 3.125 MG tablet Commonly known as: COREG Take 1 tablet (3.125 mg total) by mouth 2 (two) times daily.   cetirizine 5 MG tablet Commonly known as: ZYRTEC Take 1 tablet (5 mg total) by mouth daily. At bedtime as needed for nasal congestion What changed:   when to take this  reasons to take this  additional instructions   clopidogrel 75 MG tablet Commonly known as: PLAVIX Take 1 tablet (75 mg total) by mouth daily.   diazepam 10 MG tablet Commonly known as: VALIUM Take 10 mg by mouth daily as needed (anxiety prior to dental appointments).   diclofenac Sodium 1 % Gel Commonly known as: VOLTAREN Apply 2 g topically 4 (four) times daily.   FLUoxetine 20 MG tablet Commonly known as: PROZAC Take 1 tablet (20 mg total) by mouth daily. What changed: how much to take   fluticasone 50 MCG/ACT nasal spray Commonly known as: FLONASE Place 2 sprays into both nostrils daily.   levothyroxine 112 MCG tablet Commonly known as: SYNTHROID Take 1 tablet (112 mcg total) by mouth daily.   linaclotide 290 MCG Caps capsule Commonly known as: LINZESS Take 1 capsule (290 mcg total) by mouth daily before breakfast.   metFORMIN 500 MG 24 hr tablet Commonly known as: GLUCOPHAGE-XR Take 1 tablet (500 mg total) by mouth daily with breakfast.   nitroGLYCERIN 0.4 MG SL tablet Commonly known as: Nitrostat PLACE 1 TABLET UNDER THE TONGUE EVERY 5 MINS AS NEEDED FOR CHEST PAIN What changed:   how much to take  how to take this  when to take this  reasons to take this  additional instructions   pantoprazole 40 MG tablet Commonly known as:  PROTONIX Take 1 tablet (40 mg total) by mouth 2 (two) times daily.   QUEtiapine 100 MG tablet Commonly known as: SEROQUEL Take 1 tablet (100 mg total) by mouth at bedtime. What changed:   medication strength  how much to take   rosuvastatin 20 MG tablet Commonly known as: CRESTOR Take 1 tablet (20 mg total) by mouth daily.   True Metrix Blood Glucose Test test strip Generic drug: glucose blood Use as instructed once per day to check blood sugar  True Metrix Meter w/Device Kit Use daily to monitor blood sugar   TRUEplus Lancets 28G Misc Use once daily to check blood sugar   valACYclovir 500 MG tablet Commonly known as: VALTREX TAKE 1 TABLET (500 MG TOTAL) BY MOUTH 2 (TWO) TIMES DAILY FOR 3 DAYS AS NEEDED FOR ACUTE RASH What changed:   how much to take  how to take this  when to take this  additional instructions   VITAMIN D3 PO Take 1 tablet by mouth daily.            Durable Medical Equipment  (From admission, onward)         Start     Ordered   11/19/19 1329  For home use only DME 4 wheeled rolling walker with seat  Once       Question:  Patient needs a walker to treat with the following condition  Answer:  Weakness   11/19/19 1331          Follow-up Information    Fulp, Cammie, MD Follow up in 1 week(s).   Specialty: Family Medicine Why: TSH in 4 weeks-- be sure to take your synthroid Contact information: [place] [address] [phone]        Erline Levine, MD. Schedule an appointment as soon as possible for a visit.   Specialty: Neurosurgery Contact information: [address] [place] [address] [phone]        [facility] [place] Follow up.                Time coordinating discharge: 35 min  Signed:  Geradine Girt DO  [facility] 11/19/2019, 3:46 PM

## 2019-11-19 NOTE — TOC Progression Note (Addendum)
Transition of Care [facility]) - Progression Note    Patient Details  Name: HOPE BRANDENBURGER MRN: [id number] Date of Birth: October 11, 1960  Transition of Care [facility]) CM/SW Contact  Carles Collet, RN Phone Number: 11/19/2019, 1:32 PM  Clinical Narrative:   Spoke w patient at bedside. She is agreeable to [facility] services and would like [facility], as she recently was active with them. Patient accepted for [facility] PT OT . She requested rollator, order placed and requested for it to be delivered to room today. She verifies that her family will provide transport home.   Will need HH PT OT orders.    Addendum- notified by [employer] that they are out of stock on rollators. They spoke to patient and will have it shipped to her house.     Expected Discharge Plan: [place] Barriers to Discharge: Continued Medical Work up  Expected Discharge Plan and Services Expected Discharge Plan: [place]: [place] arrangements for the past 2 months: Apartment                 DME Arranged: Walker rolling with seat DME Agency: [employer] Date DME Agency Contacted: 11/19/19 Time DME Agency Contacted: 0037 Representative spoke with at DME Agency: [place]: PT, OT [place] Agency: [place] ([place]) Date [place]: 11/19/19 Time [place]: 1326 Representative spoke with at [place]: [place] ([place]) Interventions    Readmission Risk Interventions No flowsheet data found.

## 2019-11-19 NOTE — Discharge Instructions (Signed)
Meningioma Meningioma is a tumor that occurs in the thin tissue that covers the brain and spinal cord (meninges). Meningiomas are usually not cancerous (benign) and do not spread to other areas. In rare cases, a meningioma may become cancerous (malignant). What are the causes? In many cases, the cause of this condition is not known. In some cases, meningioma may be caused by:  Having a genetic disorder that causes multiple soft tumors (neurofibromatosis 2).  A change in certain genes (genetic mutation). What increases the risk? You are more likely to develop this condition if:  You have been exposed to radiation.  You are an older woman. Older women have a higher risk of meningiomas than men or children. However, men have a higher risk of malignant meningiomas.  You have injured your head in the past.  You have a history of breast cancer. What are the signs or symptoms? Symptoms of this condition usually begin very slowly. The symptoms may depend on the size and location of the tumor. Possible symptoms include:  Headaches.  Nausea and vomiting.  Vision changes.  Hearing changes.  Loss of the sense of smell.  Fits of uncontrolled movements (seizures).  Weakness or numbness on one side of the body or in an arm or leg.  Mood or personality changes.  Problems with memory or thinking. How is this diagnosed? This condition is diagnosed based on:  Results of brain imaging tests, such as a CT scan or MRI.  Removal and testing of a sample of the tumor (biopsy). This may be done to confirm the diagnosis and to help determine the best treatment for the condition. How is this treated? You may not have treatment until your symptoms start to affect your daily activities. This is because meningioma grows so slowly, and your health care provider may prefer to monitor its growth before starting treatment. If you do need treatment, it may include:  Medicines to decrease brain swelling  and improve symptoms (steroids).  High-energy rays (radiation therapy) to shrink or kill the tumor.  Anti-cancer medicines (chemotherapy) to shrink or kill the tumor. Chemotherapy has many side effects because it also kills healthy cells.  Targeted therapy. This kills cancerous cells without affecting normal cells.  Surgery to remove as much of the tumor as possible. Follow these instructions at home:   Take over-the-counter and prescription medicines only as told by your health care provider.  Keep all follow-up visits as told by your health care provider. This is important. You may need regular visits to monitor the growth of your tumor. Contact a health care provider if:  You have symptoms that come back.  You have diarrhea.  You vomit.  You have abdominal pain.  You cannot eat or drink as much as you need.  You are weaker or more tired than usual.  You are losing weight without trying. Get help right away if:  Your diarrhea, vomiting, or abdominal pain does not go away.  You have new symptoms, such as vision problems or difficulty walking.  You have a seizure.  You have bleeding that does not stop.  You have trouble breathing.  You have a fever. Summary  Meningioma is a tumor that occurs in the thin tissue that covers the brain and spinal cord (meninges).  Meningiomas are usually benign, which means they are not cancerous and do not spread to other areas.  Symptoms of this condition usually begin very slowly. The symptoms may depend on the size and location of   the tumor.  Your tumor may be monitored over time. You may not need treatment until your tumor starts to affect your daily life. This information is not intended to replace advice given to you by your health care provider. Make sure you discuss any questions you have with your health care provider. Document Revised: 01/06/2017 Document Reviewed: 01/29/2016 Elsevier Patient Education  [place]   No driving
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
## 2019-11-20 ENCOUNTER — Telehealth: Payer: Self-pay

## 2019-11-20 NOTE — Telephone Encounter (Signed)
Transition Care Management Follow-up Telephone Call    Date of discharge and from where:[facility] on 11/19/2019 How have you been since you were released from the hospital? ok Any questions or concerns? No questions/concerns reported. None reported / did not speak much stated she does not be to long on the phone Items Reviewed: Did the pt receive and understand the discharge instructions provided? Stated that have the instructions and have no questions.  Medications obtained and verified? Not yet said her son will be picking up. stressed the importance to strictly adhere MD discharge instructions.  .  Any new allergies since your discharge? None reported  Do you have support at home? Yes, son Other (ie: DME, Home Health, etc)       Functional Questionnaire: (I = Independent and D = Dependent) ADL's:  Independent.      Follow up appointments reviewed:    PCP Hospital f/u appt confirmed? Dr Chapman Fitch on 01/02/2020@ 1050. Informed they would receive notification if the appointment is in person or televisit. [facility] f/u appt confirmed? None scheduled at this time   Are transportation arrangements needed?have transportation    If their condition worsens, is the pt aware to call  their PCP or go to the ED? Yes.Made pt aware if condition worsen or start experiencing rapid weight gain, chest pain, diff breathing, SOB, high fevers, or bleading to refer imediately to ED for further evaluation.   Was the patient provided with contact information for the PCP's office or ED? He has the phone number   Was the pt encouraged to call back with questions or concerns?yes  Advised pt to keep her sugar under control and keep a log when F/u visit with the MD

## 2019-11-22 ENCOUNTER — Telehealth: Payer: Self-pay | Admitting: Family Medicine

## 2019-11-22 NOTE — Telephone Encounter (Signed)
Copied from [place] [id number]. Topic: General - Other >> Nov 22, 2019  3:38 PM Leward Quan A wrote: Reason for CRM: Havery Moros with [facility] called to inform Dr Chapman Fitch that patient refused the care due to not wanting the visit to be billed to her insurance. Any questions  Ph# [phone]

## 2019-11-27 ENCOUNTER — Other Ambulatory Visit: Payer: Self-pay

## 2019-11-27 ENCOUNTER — Other Ambulatory Visit: Payer: Self-pay | Admitting: Family Medicine

## 2019-11-27 ENCOUNTER — Ambulatory Visit
Admission: RE | Admit: 2019-11-27 | Discharge: 2019-11-27 | Disposition: A | Discharge status: Home / Self Care | Payer: Medicare Other | Source: Ambulatory Visit | Attending: Family Medicine | Admitting: Family Medicine

## 2019-11-27 DIAGNOSIS — Z1231 Encounter for screening mammogram for malignant neoplasm of breast: Secondary | ICD-10-CM

## 2019-12-02 ENCOUNTER — Other Ambulatory Visit: Payer: Self-pay

## 2019-12-02 ENCOUNTER — Ambulatory Visit: Payer: Medicare Other | Attending: Family Medicine | Admitting: Family Medicine

## 2019-12-02 ENCOUNTER — Other Ambulatory Visit: Payer: Self-pay | Admitting: Family Medicine

## 2019-12-02 ENCOUNTER — Encounter: Payer: Self-pay | Admitting: Family Medicine

## 2019-12-02 VITALS — BP 147/92 | HR 60 | Temp 97.0°F | Wt 147.4 lb

## 2019-12-02 DIAGNOSIS — Z79890 Hormone replacement therapy (postmenopausal): Secondary | ICD-10-CM | POA: Diagnosis not present

## 2019-12-02 DIAGNOSIS — Z91040 Latex allergy status: Secondary | ICD-10-CM | POA: Insufficient documentation

## 2019-12-02 DIAGNOSIS — Z955 Presence of coronary angioplasty implant and graft: Secondary | ICD-10-CM | POA: Diagnosis not present

## 2019-12-02 DIAGNOSIS — D329 Benign neoplasm of meninges, unspecified: Secondary | ICD-10-CM | POA: Diagnosis not present

## 2019-12-02 DIAGNOSIS — Z87891 Personal history of nicotine dependence: Secondary | ICD-10-CM | POA: Insufficient documentation

## 2019-12-02 DIAGNOSIS — M545 Low back pain, unspecified: Secondary | ICD-10-CM

## 2019-12-02 DIAGNOSIS — Z7984 Long term (current) use of oral hypoglycemic drugs: Secondary | ICD-10-CM | POA: Diagnosis not present

## 2019-12-02 DIAGNOSIS — R0981 Nasal congestion: Secondary | ICD-10-CM | POA: Diagnosis not present

## 2019-12-02 DIAGNOSIS — K219 Gastro-esophageal reflux disease without esophagitis: Secondary | ICD-10-CM | POA: Diagnosis not present

## 2019-12-02 DIAGNOSIS — K149 Disease of tongue, unspecified: Secondary | ICD-10-CM | POA: Diagnosis not present

## 2019-12-02 DIAGNOSIS — J309 Allergic rhinitis, unspecified: Secondary | ICD-10-CM

## 2019-12-02 DIAGNOSIS — Z8249 Family history of ischemic heart disease and other diseases of the circulatory system: Secondary | ICD-10-CM | POA: Insufficient documentation

## 2019-12-02 DIAGNOSIS — H539 Unspecified visual disturbance: Secondary | ICD-10-CM | POA: Diagnosis not present

## 2019-12-02 DIAGNOSIS — Z7951 Long term (current) use of inhaled steroids: Secondary | ICD-10-CM | POA: Insufficient documentation

## 2019-12-02 DIAGNOSIS — I25110 Atherosclerotic heart disease of native coronary artery with unstable angina pectoris: Secondary | ICD-10-CM | POA: Insufficient documentation

## 2019-12-02 DIAGNOSIS — J439 Emphysema, unspecified: Secondary | ICD-10-CM | POA: Diagnosis not present

## 2019-12-02 DIAGNOSIS — D320 Benign neoplasm of cerebral meninges: Secondary | ICD-10-CM | POA: Diagnosis not present

## 2019-12-02 DIAGNOSIS — Z880 Allergy status to penicillin: Secondary | ICD-10-CM | POA: Diagnosis not present

## 2019-12-02 DIAGNOSIS — I10 Essential (primary) hypertension: Secondary | ICD-10-CM | POA: Diagnosis not present

## 2019-12-02 DIAGNOSIS — Z09 Encounter for follow-up examination after completed treatment for conditions other than malignant neoplasm: Secondary | ICD-10-CM

## 2019-12-02 DIAGNOSIS — Z833 Family history of diabetes mellitus: Secondary | ICD-10-CM | POA: Insufficient documentation

## 2019-12-02 DIAGNOSIS — F319 Bipolar disorder, unspecified: Secondary | ICD-10-CM | POA: Insufficient documentation

## 2019-12-02 DIAGNOSIS — R1310 Dysphagia, unspecified: Secondary | ICD-10-CM | POA: Insufficient documentation

## 2019-12-02 DIAGNOSIS — B009 Herpesviral infection, unspecified: Secondary | ICD-10-CM

## 2019-12-02 DIAGNOSIS — E119 Type 2 diabetes mellitus without complications: Secondary | ICD-10-CM | POA: Insufficient documentation

## 2019-12-02 DIAGNOSIS — E039 Hypothyroidism, unspecified: Secondary | ICD-10-CM

## 2019-12-02 DIAGNOSIS — I25700 Atherosclerosis of coronary artery bypass graft(s), unspecified, with unstable angina pectoris: Secondary | ICD-10-CM

## 2019-12-02 DIAGNOSIS — Z7902 Long term (current) use of antithrombotics/antiplatelets: Secondary | ICD-10-CM | POA: Diagnosis not present

## 2019-12-02 DIAGNOSIS — B0089 Other herpesviral infection: Secondary | ICD-10-CM | POA: Insufficient documentation

## 2019-12-02 DIAGNOSIS — F3130 Bipolar disorder, current episode depressed, mild or moderate severity, unspecified: Secondary | ICD-10-CM

## 2019-12-02 DIAGNOSIS — K224 Dyskinesia of esophagus: Secondary | ICD-10-CM

## 2019-12-02 DIAGNOSIS — Z800 Family history of malignant neoplasm of digestive organs: Secondary | ICD-10-CM | POA: Insufficient documentation

## 2019-12-02 DIAGNOSIS — E089 Diabetes mellitus due to underlying condition without complications: Secondary | ICD-10-CM | POA: Diagnosis not present

## 2019-12-02 DIAGNOSIS — Z79899 Other long term (current) drug therapy: Secondary | ICD-10-CM | POA: Insufficient documentation

## 2019-12-02 MED ORDER — METFORMIN HCL ER 500 MG PO TB24: 500.0000 mg | ORAL_TABLET | Freq: Every day | ORAL | 1 refills | Status: DC

## 2019-12-02 MED ORDER — QUETIAPINE FUMARATE 100 MG PO TABS: 100.0000 mg | ORAL_TABLET | Freq: Every day | ORAL | 1 refills | Status: DC

## 2019-12-02 MED ORDER — CARVEDILOL 3.125 MG PO TABS: 3.1250 mg | ORAL_TABLET | Freq: Two times a day (BID) | ORAL | 3 refills | Status: DC

## 2019-12-02 MED ORDER — LEVOTHYROXINE SODIUM 112 MCG PO TABS: 112.0000 ug | ORAL_TABLET | Freq: Every day | ORAL | 1 refills | Status: DC

## 2019-12-02 MED ORDER — PANTOPRAZOLE SODIUM 40 MG PO TBEC: 40.0000 mg | DELAYED_RELEASE_TABLET | Freq: Two times a day (BID) | ORAL | 1 refills | Status: DC

## 2019-12-02 MED ORDER — ROSUVASTATIN CALCIUM 20 MG PO TABS: 20.0000 mg | ORAL_TABLET | Freq: Every day | ORAL | 3 refills | Status: DC

## 2019-12-02 MED ORDER — FLUOXETINE HCL 20 MG PO TABS: 20.0000 mg | ORAL_TABLET | Freq: Every day | ORAL | 1 refills | Status: DC

## 2019-12-02 MED ORDER — NYSTATIN 100000 UNIT/ML MT SUSP: 5.0000 mL | Freq: Four times a day (QID) | OROMUCOSAL | 0 refills | Status: DC

## 2019-12-02 MED ORDER — CETIRIZINE HCL 5 MG PO TABS: 5.0000 mg | ORAL_TABLET | Freq: Every day | ORAL | 6 refills | Status: DC

## 2019-12-02 MED ORDER — VALACYCLOVIR HCL 500 MG PO TABS: ORAL_TABLET | ORAL | 11 refills | Status: DC

## 2019-12-02 MED ORDER — FLUTICASONE PROPIONATE 50 MCG/ACT NA SUSP: 2.0000 | Freq: Every day | NASAL | 6 refills | Status: DC

## 2019-12-02 MED ORDER — ALBUTEROL SULFATE HFA 108 (90 BASE) MCG/ACT IN AERS: INHALATION_SPRAY | RESPIRATORY_TRACT | 11 refills | Status: DC
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
## 2019-12-03 ENCOUNTER — Telehealth: Payer: Self-pay

## 2019-12-03 LAB — BASIC METABOLIC PANEL WITH GFR
BUN/Creatinine Ratio: 17 (ref 9–23)
BUN: 15 mg/dL (ref 6–24)
CO2: 25 mmol/L (ref 20–29)
Calcium: 9.7 mg/dL (ref 8.7–10.2)
Chloride: 103 mmol/L (ref 96–106)
Creatinine, Ser: 0.89 mg/dL (ref 0.57–1.00)
GFR calc Af Amer: 82 mL/min/1.73
GFR calc non Af Amer: 71 mL/min/1.73
Glucose: 87 mg/dL (ref 65–99)
Potassium: 4.4 mmol/L (ref 3.5–5.2)
Sodium: 142 mmol/L (ref 134–144)

## 2019-12-03 LAB — TSH: TSH: 9.12 u[IU]/mL — ABNORMAL HIGH (ref 0.450–4.500)

## 2019-12-03 NOTE — Telephone Encounter (Signed)
Call placed to [employer] to check on status of the order for the CPAP mask.  Spoke to [place]ona who said that because this is the first order for supplies that they have received for the patient, they need to "qualify " her for insurance purposes and they are in the process of collecting the documentation that they need from [facility]. She did not request any information from this clinic.

## 2019-12-04 NOTE — Progress Notes (Signed)
Normal result letter generated and mailed to address on file.

## 2019-12-05 ENCOUNTER — Ambulatory Visit: Payer: Medicare Other | Admitting: Family Medicine

## 2019-12-12 MED FILL — NYSTATIN 100,000 UNITS/ML S: 100000 | 15 days supply | Qty: 60 | Fill #0

## 2019-12-12 MED FILL — metFORMIN HCL ER 500 MG TB2: 500 | 90 days supply | Qty: 90 | Fill #0

## 2019-12-12 MED FILL — ROSUVASTATIN CALCIUM 20 MG: 20 | 30 days supply | Qty: 60 | Fill #0

## 2019-12-12 MED FILL — VENTOLIN HFA 90 MCG INHALER: 108 (90 BAS | 25 days supply | Qty: 18 | Fill #0

## 2019-12-12 MED FILL — LEVOTHYROXINE 112 MCG TAB: 112 | 30 days supply | Qty: 30 | Fill #0

## 2019-12-12 MED FILL — VALACYCLOVIR HCL 500 MG TAB: 500 | 3 days supply | Qty: 6 | Fill #0

## 2019-12-12 MED FILL — QUETIAPINE FUMARATE 100 MG: 100 | 30 days supply | Qty: 30 | Fill #0

## 2019-12-12 MED FILL — PANTOPRAZOLE SOD DR 40 MG T: 40 | 30 days supply | Qty: 60 | Fill #0

## 2019-12-12 MED FILL — CARVEDILOL 3.125 MG TABLET: 3.125 | 30 days supply | Qty: 180 | Fill #0

## 2019-12-12 MED FILL — FLUTICASONE PROP 50 MCG SPR: 50 | 25 days supply | Qty: 16 | Fill #0

## 2019-12-17 ENCOUNTER — Telehealth: Payer: Self-pay

## 2019-12-17 NOTE — Telephone Encounter (Signed)
Fax received from [facility] stating that they are not able to service the patient at this time; but there is no indication of what they are referring to.  Call placed to [facility] # [phone], spoke to [place] regarding the above.  He explained that it is regarding the CPAP mask order and they voided the order because there is no recent documentation that refers to the CPAP or sleep apnea.  He said that he would have the department that processed this order, contact this CM back within an hour with more information.

## 2019-12-23 ENCOUNTER — Emergency Department (HOSPITAL_COMMUNITY)
Admission: EM | Admit: 2019-12-23 | Discharge: 2019-12-23 | Disposition: A | Discharge status: Home / Self Care | Payer: Medicare Other | Attending: Emergency Medicine | Admitting: Emergency Medicine

## 2019-12-23 ENCOUNTER — Emergency Department (HOSPITAL_COMMUNITY): Payer: Medicare Other

## 2019-12-23 ENCOUNTER — Encounter (HOSPITAL_COMMUNITY): Payer: Self-pay | Admitting: Emergency Medicine

## 2019-12-23 DIAGNOSIS — Z951 Presence of aortocoronary bypass graft: Secondary | ICD-10-CM | POA: Insufficient documentation

## 2019-12-23 DIAGNOSIS — I110 Hypertensive heart disease with heart failure: Secondary | ICD-10-CM | POA: Insufficient documentation

## 2019-12-23 DIAGNOSIS — R0789 Other chest pain: Secondary | ICD-10-CM

## 2019-12-23 DIAGNOSIS — I2510 Atherosclerotic heart disease of native coronary artery without angina pectoris: Secondary | ICD-10-CM | POA: Diagnosis not present

## 2019-12-23 DIAGNOSIS — Z9861 Coronary angioplasty status: Secondary | ICD-10-CM | POA: Insufficient documentation

## 2019-12-23 DIAGNOSIS — D329 Benign neoplasm of meninges, unspecified: Secondary | ICD-10-CM | POA: Insufficient documentation

## 2019-12-23 DIAGNOSIS — J449 Chronic obstructive pulmonary disease, unspecified: Secondary | ICD-10-CM | POA: Diagnosis not present

## 2019-12-23 DIAGNOSIS — Z79899 Other long term (current) drug therapy: Secondary | ICD-10-CM | POA: Diagnosis not present

## 2019-12-23 DIAGNOSIS — R519 Headache, unspecified: Secondary | ICD-10-CM

## 2019-12-23 DIAGNOSIS — K219 Gastro-esophageal reflux disease without esophagitis: Secondary | ICD-10-CM | POA: Diagnosis not present

## 2019-12-23 DIAGNOSIS — Z7984 Long term (current) use of oral hypoglycemic drugs: Secondary | ICD-10-CM | POA: Insufficient documentation

## 2019-12-23 DIAGNOSIS — Z86011 Personal history of benign neoplasm of the brain: Secondary | ICD-10-CM

## 2019-12-23 DIAGNOSIS — I509 Heart failure, unspecified: Secondary | ICD-10-CM | POA: Diagnosis not present

## 2019-12-23 DIAGNOSIS — E119 Type 2 diabetes mellitus without complications: Secondary | ICD-10-CM | POA: Diagnosis not present

## 2019-12-23 DIAGNOSIS — Z7902 Long term (current) use of antithrombotics/antiplatelets: Secondary | ICD-10-CM | POA: Insufficient documentation

## 2019-12-23 DIAGNOSIS — E039 Hypothyroidism, unspecified: Secondary | ICD-10-CM | POA: Insufficient documentation

## 2019-12-23 LAB — BASIC METABOLIC PANEL
Anion gap: 11 (ref 5–15)
BUN: 10 mg/dL (ref 6–20)
CO2: 25 mmol/L (ref 22–32)
Calcium: 9.4 mg/dL (ref 8.9–10.3)
Chloride: 105 mmol/L (ref 98–111)
Creatinine, Ser: 0.83 mg/dL (ref 0.44–1.00)
GFR, Estimated: >60 mL/min (ref >60)
Glucose, Bld: 107 mg/dL — ABNORMAL HIGH (ref 70–99)
Potassium: 3.8 mmol/L (ref 3.5–5.1)
Sodium: 141 mmol/L (ref 135–145)

## 2019-12-23 LAB — CBC
HCT: 41.6 % (ref 36.0–46.0)
Hemoglobin: 13.3 g/dL (ref 12.0–15.0)
MCH: 30.6 pg (ref 26.0–34.0)
MCHC: 32 g/dL (ref 30.0–36.0)
MCV: 95.9 fL (ref 80.0–100.0)
Platelets: 337 10*3/uL (ref 150–400)
RBC: 4.34 MIL/uL (ref 3.87–5.11)
RDW: 14.6 % (ref 11.5–15.5)
WBC: 8.3 10*3/uL (ref 4.0–10.5)
nRBC: 0 % (ref 0.0–0.2)

## 2019-12-23 LAB — TROPONIN I (HIGH SENSITIVITY)
Troponin I (High Sensitivity): 3 ng/L (ref <18)
Troponin I (High Sensitivity): 3 ng/L (ref <18)

## 2019-12-23 LAB — I-STAT BETA HCG BLOOD, ED (MC, WL, AP ONLY): I-stat hCG, quantitative: <5 m[IU]/mL (ref <5)

## 2019-12-23 MED ORDER — DIPHENHYDRAMINE HCL 50 MG/ML IJ SOLN
12.5000 mg | Freq: Once | INTRAMUSCULAR | Status: AC
  Administered 2019-12-23: 12.5 mg via INTRAVENOUS
  Filled 2019-12-23: qty 1

## 2019-12-23 MED ORDER — METOCLOPRAMIDE HCL 5 MG/ML IJ SOLN
10.0000 mg | Freq: Once | INTRAMUSCULAR | Status: AC
  Administered 2019-12-23: 10 mg via INTRAVENOUS
  Filled 2019-12-23: qty 2

## 2019-12-23 MED ORDER — KETOROLAC TROMETHAMINE 15 MG/ML IJ SOLN: 15.0000 mg | Freq: Once | INTRAMUSCULAR | Status: DC

## 2019-12-23 MED ORDER — ACETAMINOPHEN 500 MG PO TABS
1000.0000 mg | ORAL_TABLET | Freq: Once | ORAL | Status: AC
  Administered 2019-12-23: 1000 mg via ORAL
  Filled 2019-12-23: qty 2

## 2019-12-23 MED ORDER — DEXAMETHASONE SODIUM PHOSPHATE 10 MG/ML IJ SOLN
10.0000 mg | Freq: Once | INTRAMUSCULAR | Status: AC
  Administered 2019-12-23: 10 mg via INTRAVENOUS
  Filled 2019-12-23: qty 1

## 2019-12-23 MED ORDER — LACTATED RINGERS IV BOLUS
1000.0000 mL | Freq: Once | INTRAVENOUS | Status: AC
  Administered 2019-12-23: 1000 mL via INTRAVENOUS

## 2019-12-23 MED FILL — LEVOTHYROXINE 112 MCG TAB: 112 | 30 days supply | Qty: 30 | Fill #0

## 2019-12-23 MED FILL — CARVEDILOL 3.125 MG TABLET: 3.125 | 30 days supply | Qty: 180 | Fill #0

## 2019-12-23 NOTE — ED Notes (Signed)
Ambulated without any difficulty

## 2019-12-23 NOTE — ED Notes (Signed)
Patient transported to CT 

## 2019-12-23 NOTE — ED Triage Notes (Signed)
Pt reports headache x3 days with blurred vision to the R eye since Saturday, reports recently diagnosed with brain tumor that neuro is going to "watch for the next 3 months." also endorses chest pain with L arm pain that began this morning. A/ox4, speech clear, moves all limbs equally.

## 2019-12-23 NOTE — Discharge Instructions (Signed)
It is important for you to take your medications including your hypertension medication every day. Follow-up with the neurologist and primary care provider. Continue your other home medications as previously prescribed. Return to the ER if you start to experience worsening chest pain, headache, shortness of breath.
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
## 2019-12-23 NOTE — ED Notes (Signed)
Patient given crackers, cheese, and apple juice.

## 2019-12-23 NOTE — ED Provider Notes (Signed)
Diagnosed with meningioma one week ago. Presented as Code Stroke when they found that. Plan to observe for 3 months  P/w CP and HA -Constant for 3 days, blurry vision. Nonfocal neuro exam. -HTN but didn't take meds today -CT Head with NAICA, stable meningioma -initial trop normal, EKG ok  Plan -Reassess after migraine cocktail -F/u delta trop -Likely discharge home   Physical Exam  BP (!) 167/89   Pulse (!) 59   Temp 98.4 F (36.9 C) (Oral)   Resp 15   SpO2 100%   Physical Exam Neurological:     Mental Status: She is alert and oriented to person, place, and time. Mental status is at baseline.     GCS: GCS eye subscore is 4. GCS verbal subscore is 5. GCS motor subscore is 6.     Cranial Nerves: No cranial nerve deficit, dysarthria or facial asymmetry.     Sensory: No sensory deficit.     Motor: No weakness.     ED Course/Procedures     Procedures  MDM  On initial assessment after handoff, patient not yet received reglan/benadryl. Troponin negative x2.  CBC and BMP unremarkable.  Per chart review (progress note 12/02/2019), patient has a history of right eye vision changes including intermittent blurry vision as well as recurring headaches.  At time of discharge on 12/02/2019, patient was set up with outpatient [facility] eye care (neuro-ophthalmologist).  Minimal improvement with reglan/benadryl. Thus given tylenol, 1L LR, and decadron 10mg . On reassessment, patient continued to have severe pain. Discussed workup thus far and low concern for emergent etiology given history of HA and vision change per EMR and negative CTHead today. Recommended close PCP/Neuro follow-up, patient amenable to this plan. Strict return precautions given. Toradol ordered to be given prior to discharge. Discharged in stable condition.        Lindsay Huntsman, MD 12/24/19 1423    Lindsay Morrison, MD 12/24/19 0830

## 2019-12-25 ENCOUNTER — Ambulatory Visit (INDEPENDENT_AMBULATORY_CARE_PROVIDER_SITE_OTHER): Payer: Medicare Other | Admitting: Physician Assistant

## 2019-12-25 ENCOUNTER — Encounter: Payer: Self-pay | Admitting: Physician Assistant

## 2019-12-25 VITALS — BP 176/94 | HR 84 | Ht 62.0 in | Wt 148.0 lb

## 2019-12-25 DIAGNOSIS — I10 Essential (primary) hypertension: Secondary | ICD-10-CM | POA: Diagnosis not present

## 2019-12-25 DIAGNOSIS — I25700 Atherosclerosis of coronary artery bypass graft(s), unspecified, with unstable angina pectoris: Secondary | ICD-10-CM

## 2019-12-25 DIAGNOSIS — R1310 Dysphagia, unspecified: Secondary | ICD-10-CM | POA: Diagnosis not present

## 2019-12-25 DIAGNOSIS — K219 Gastro-esophageal reflux disease without esophagitis: Secondary | ICD-10-CM | POA: Diagnosis not present

## 2019-12-25 MED ORDER — ESOMEPRAZOLE MAGNESIUM 40 MG PO CPDR: 40.0000 mg | DELAYED_RELEASE_CAPSULE | Freq: Every day | ORAL | 3 refills | Status: DC
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
## 2019-12-25 NOTE — Patient Instructions (Signed)
If you are age 59 or older, your body mass index should be between 23-30. Your Body mass index is 27.07 kg/m. If this is out of the aforementioned range listed, please consider follow up with your Primary Care Provider.  If you are age 17 or younger, your body mass index should be between 19-25. Your Body mass index is 27.07 kg/m. If this is out of the aformentioned range listed, please consider follow up with your Primary Care Provider.   We have sent the following medications to your pharmacy for you to pick up at your convenience: Nexium 40 mg twice daily   Please give [place] a call when your blood pressure is better.  Thank you for choosing me and [facility].  Ellouise Newer, PA-C

## 2019-12-27 NOTE — Progress Notes (Signed)
Addendum: Reviewed and agree with assessment and management plan. Isiah Scheel M, MD  

## 2020-04-02 ENCOUNTER — Encounter: Payer: Self-pay | Admitting: *Deleted

## 2020-04-07 ENCOUNTER — Institutional Professional Consult (permissible substitution): Payer: 59 | Admitting: Neurology

## 2020-04-07 ENCOUNTER — Encounter: Payer: Self-pay | Admitting: Neurology

## 2020-04-07 ENCOUNTER — Telehealth: Payer: Self-pay | Admitting: *Deleted

## 2020-04-07 NOTE — Telephone Encounter (Signed)
No showed consultation appointment.

## 2020-06-30 ENCOUNTER — Ambulatory Visit (INDEPENDENT_AMBULATORY_CARE_PROVIDER_SITE_OTHER): Payer: 59 | Admitting: Neurology

## 2020-06-30 ENCOUNTER — Other Ambulatory Visit: Payer: Self-pay

## 2020-06-30 ENCOUNTER — Encounter: Payer: Self-pay | Admitting: Neurology

## 2020-06-30 VITALS — BP 114/78 | HR 74 | Ht 62.0 in | Wt 151.5 lb

## 2020-06-30 DIAGNOSIS — D329 Benign neoplasm of meninges, unspecified: Secondary | ICD-10-CM

## 2020-06-30 DIAGNOSIS — F1910 Other psychoactive substance abuse, uncomplicated: Secondary | ICD-10-CM
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
## 2020-07-01 ENCOUNTER — Encounter: Payer: Self-pay | Admitting: Neurology

## 2020-07-02 ENCOUNTER — Telehealth: Payer: Self-pay | Admitting: Neurology

## 2020-07-02 NOTE — Telephone Encounter (Signed)
Neurosurgery referral sent to [facility]. Phone: [phone]. Fax: [phone].

## 2020-07-21 ENCOUNTER — Ambulatory Visit
Admission: RE | Admit: 2020-07-21 | Discharge: 2020-07-21 | Disposition: A | Discharge status: Home / Self Care | Payer: 59 | Source: Ambulatory Visit | Attending: Neurology | Admitting: Neurology

## 2020-07-21 ENCOUNTER — Other Ambulatory Visit: Payer: Self-pay

## 2020-07-21 DIAGNOSIS — D329 Benign neoplasm of meninges, unspecified: Secondary | ICD-10-CM

## 2020-07-21 DIAGNOSIS — F1910 Other psychoactive substance abuse, uncomplicated: Secondary | ICD-10-CM

## 2020-07-21 MED ORDER — DIPHENHYDRAMINE HCL 50 MG/ML IJ SOLN
50.0000 mg | Freq: Once | INTRAMUSCULAR | Status: AC
  Administered 2020-07-21: 13:00:00 50 mg via INTRAVENOUS

## 2020-07-21 MED ORDER — GADOBENATE DIMEGLUMINE 529 MG/ML IV SOLN
14.0000 mL | Freq: Once | INTRAVENOUS | Status: AC | PRN
  Administered 2020-07-21: 13:00:00 14 mL via INTRAVENOUS

## 2020-07-21 NOTE — Progress Notes (Addendum)
Notified of patient having a reaction to MR contrast. Upon my arrival into the MR suit, the patient was sitting up on the table. Pt did not appear to be in any distress. Dr. Maree Erie at the bedside and Suanne Marker, MR tech was present. Per the patient, she started coughing really bad and developed an itchy throat. Pt denied any difficulty breathing, pt denied her tongue feeling abnormal. Pts lips and tongue did not appear swollen. Pt reported "I am feeling better now". Per Dr. Maree Erie 50 mg of IV benadryl was administered. Pt will be placed in the nursing recovery area and watched until Dr. Maree Erie discharges the patient. Pt does report she has someone driving her home today.   Edit to add: Pt is is nursing recovery area, about 10 minutes after benadryl given, she states "I feel completely normal now". Denies any throat itching, coughing, shortness of breath. Will watching until discharged by MD

## 2020-07-22 ENCOUNTER — Telehealth: Payer: Self-pay | Admitting: Neurology

## 2020-07-22 NOTE — Telephone Encounter (Signed)
  IMPRESSION: Abnormal MRI scan of the brain with and without contrast showing 14 x 10 x 12 mm extra-axial on the right side of the pons causing mild local mass-effect with imaging characteristics quite characteristic for a tentorial meningioma.  Compared to previous MRI from 11/17/2019 there appears to be slight increase in size.  No other significant abnormalities noted. Please call Lindsay Rivera, repeat MRI of the brain with without contrast showed right pons meningioma, with mild local mass-effect, compared to previous study November 17, 2019, appears to slight increase in size. I referred Lindsay Rivera to neurosurgeon in May 2022, please check on progress.

## 2020-07-23 NOTE — Telephone Encounter (Signed)
I spoke to the patient and provided her with the MRI brain results. She has her appt w/ [facility] today. She will be seeing Dr. Vertell Limber.

## 2020-07-28 ENCOUNTER — Other Ambulatory Visit: Payer: Self-pay | Admitting: Neurological Surgery

## 2020-07-28 ENCOUNTER — Telehealth: Payer: Self-pay | Admitting: *Deleted

## 2020-07-28 NOTE — Telephone Encounter (Signed)
   [facility] Pre-operative Risk Assessment    Patient Name: Lindsay Rivera  DOB: Nov 20, 1960  MRN: [id number]   HEARTCARE STAFF: - Please ensure there is not already an duplicate clearance open for this procedure. - Under Visit Info/Reason for Call, type in Other and utilize the format Clearance MM/DD/YY or Clearance TBD. Do not use dashes or single digits. - If request is for dental extraction, please clarify the # of teeth to be extracted. - If the patient is currently at the dentist's office, call Pre-Op APP to address. If the patient is not currently in the dentist office, please route to the Pre-Op pool  Request for surgical clearance:  What type of surgery is being performed? RIGHT CRANIOTOMY FOR TUMOR RESECTION WITH LUMBAR DRAIN PLACEMENT  When is this surgery scheduled? 09/01/20   What type of clearance is required (medical clearance vs. Pharmacy clearance to hold med vs. Both)? MEDICAL  Are there any medications that need to be held prior to surgery and how long? PLAVIX    Practice name and name of physician performing surgery? [facility]; DR. Emelda Brothers   What is the office phone number? [phone]   7.   What is the office fax number? [place]: JESSICA  8.   Anesthesia type (None, local, MAC, general) ? GENERAL   Julaine Hua 07/28/2020, 3:47 PM  _________________________________________________________________   (provider comments below)

## 2020-07-29 ENCOUNTER — Encounter: Payer: Self-pay | Admitting: Cardiovascular Disease

## 2020-07-29 ENCOUNTER — Ambulatory Visit (INDEPENDENT_AMBULATORY_CARE_PROVIDER_SITE_OTHER): Payer: 59 | Admitting: Cardiovascular Disease

## 2020-07-29 ENCOUNTER — Other Ambulatory Visit: Payer: Self-pay

## 2020-07-29 DIAGNOSIS — I10 Essential (primary) hypertension: Secondary | ICD-10-CM

## 2020-07-29 DIAGNOSIS — I25700 Atherosclerosis of coronary artery bypass graft(s), unspecified, with unstable angina pectoris: Secondary | ICD-10-CM

## 2020-07-29 MED ORDER — TRIAMTERENE-HCTZ 37.5-25 MG PO TABS: 1.0000 | ORAL_TABLET | Freq: Every day | ORAL | 3 refills | Status: DC

## 2020-07-29 MED ORDER — AMLODIPINE BESYLATE 10 MG PO TABS: 10.0000 mg | ORAL_TABLET | Freq: Every day | ORAL | 3 refills | Status: DC

## 2020-07-29 MED ORDER — CLOPIDOGREL BISULFATE 75 MG PO TABS: 75.0000 mg | ORAL_TABLET | Freq: Every day | ORAL | 3 refills | Status: DC

## 2020-07-29 MED ORDER — ROSUVASTATIN CALCIUM 20 MG PO TABS: 20.0000 mg | ORAL_TABLET | Freq: Every day | ORAL | 3 refills | Status: DC

## 2020-07-29 MED ORDER — CARVEDILOL 3.125 MG PO TABS: ORAL_TABLET | Freq: Two times a day (BID) | ORAL | 0 refills | Status: DC

## 2020-07-29 NOTE — Telephone Encounter (Signed)
   Name: Lindsay Rivera  DOB: [birth year]  MRN: [id number]  Primary Cardiologist: Mertie Moores, MD  Chart reviewed as part of pre-operative protocol coverage. Because of Lindsay Rivera's past medical history and time since last visit, she will require a follow-up visit in order to better assess preoperative cardiovascular risk.  Pre-op covering staff: - Please schedule appointment and call patient to inform them. If patient already had an upcoming appointment within acceptable timeframe, please add "pre-op clearance" to the appointment notes so provider is aware. - Please contact requesting surgeon's office via preferred method (i.e, phone, fax) to inform them of need for appointment prior to surgery.  If applicable, this message will also be routed to pharmacy pool and/or primary cardiologist for input on holding anticoagulant/antiplatelet agent as requested below so that this information is available to the clearing provider at time of patient's appointment.   Tami Lin Nicolai Labonte, PA  07/29/2020, 12:16 PM

## 2020-07-29 NOTE — Telephone Encounter (Signed)
    Pt is at low risk for her upcoming craniotomy and brain surgery . She may hold her Plavix for 7 days prior to procedure .      Mertie Moores, MD  07/29/2020 4:56 PM    [facility] [place],  [address] [place], [place]  [address] Phone: [phone]; Fax: [phone]

## 2020-07-29 NOTE — Patient Instructions (Signed)
   Medication Instructions:  Your physician recommends that you continue on your current medications as directed. Please refer to the Current Medication list given to you today.  *If you need a refill on your cardiac medications before your next appointment, please call your pharmacy*   Lab Work: none If you have labs (blood work) drawn today and your tests are completely normal, you will receive your results only by: [place] (if you have MyChart) OR A paper copy in the mail If you have any lab test that is abnormal or we need to change your treatment, we will call you to review the results.   Testing/Procedures: none   Follow-Up: At [facility], you and your health needs are our priority.  As part of our continuing mission to provide you with exceptional heart care, we have created designated Provider Care Teams.  These Care Teams include your primary Cardiologist (physician) and Advanced Practice Providers (APPs -  Physician Assistants and Nurse Practitioners) who all work together to provide you with the care you need, when you need it.  We recommend signing up for the patient portal called "MyChart".  Sign up information is provided on this After Visit Summary.  MyChart is used to connect with patients for Virtual Visits (Telemedicine).  Patients are able to view lab/test results, encounter notes, upcoming appointments, etc.  Non-urgent messages can be sent to your provider as well.   To learn more about what you can do with MyChart, go to [email or web address].    Your next appointment:   1 year(s)  The format for your next appointment:   In Person  Provider:   You may see Mertie Moores, MD or one of the following Advanced Practice Providers on your designated Care Team:   Richardson Dopp, PA-C [place], [place]

## 2020-07-29 NOTE — Telephone Encounter (Signed)
I s/w the pt and advised her that I s/w Dr. Acie Fredrickson and his nurse Donnetta Simpers and they will see the pt today at 4:20. Pt is aware of appt and thanked me for the call and the help. The pt was crying and is very scare about her upcoming surgery (craniotomy). I will forward these notes to MD for appt today. I will send FYI to surgeon's office pt has appt today with Dr. Acie Fredrickson. Will have Dr. Acie Fredrickson fax his notes once he has cleared the pt.
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
## 2020-07-29 NOTE — Telephone Encounter (Signed)
Office notes faxed to surgeons office.

## 2020-07-30 NOTE — Telephone Encounter (Signed)
   Name: Lindsay Rivera  DOB: [birth year]  MRN: [id number]   Primary Cardiologist: Mertie Moores, MD  Chart reviewed as part of pre-operative protocol coverage.   Per Dr. Acie Fredrickson,  Pt is at low risk for her upcoming craniotomy and brain surgery . She may hold her Plavix for 7 days prior to procedure.   I will route this recommendation to the requesting party via Epic fax function and remove from pre-op pool. Please call with questions.  [place], PA 07/30/2020, 10:07 AM

## 2020-08-27 NOTE — Pre-Procedure Instructions (Signed)
Surgical Instructions    Your procedure is scheduled on Tuesday September 01, 2020.   Report to [facility] Main Entrance "A" at 05:30 A.M., then check in with the Admitting office.  Call this number if you have problems the morning of surgery:  [phone]   If you have any questions prior to your surgery date call [phone]: Open Monday-Friday 8am-4pm    Remember:  Do not eat after midnight the night before your surgery  You may drink clear liquids until 04:30 A.M. the morning of your surgery.   Clear liquids allowed are: Water, Non-Citrus Juices (without pulp), Carbonated Beverages, Clear Tea, Black Coffee Only, and Gatorade    Take these medicines the morning of surgery with A SIP OF WATER   amLODipine (NORVASC)  carvedilol (COREG)   FLUoxetine (PROZAC)  levothyroxine (SYNTHROID)   rosuvastatin (CRESTOR)     albuterol (VENTOLIN HFA) 108 (90 Base)- If needed  ALPRAZolam Duanne Moron)- If needed  baclofen (LIORESAL)- If needed  fluticasone (FLONASE)- If needed  hydrOXYzine (ATARAX/VISTARIL)- If needed  Please follow your surgeon's instructions regarding clopidogrel (PLAVIX). If you have not received instructions from your surgeon then please contact your surgeon's office.   As of today, STOP taking any Aspirin (unless otherwise instructed by your surgeon) Aleve, Naproxen, Ibuprofen, Motrin, Advil, Goody's, BC's, all herbal medications, fish oil, and all vitamins.  WHAT DO I DO ABOUT MY DIABETES MEDICATION?   Do not take oral diabetes medicines (pills) the morning of surgery.  DO NOT TAKE metFORMIN (GLUCOPHAGE-XR) the morning of surgery.  The day of surgery, do not take other diabetes injectables, including Byetta (exenatide), Bydureon (exenatide ER), Victoza (liraglutide), or Trulicity (dulaglutide).   HOW TO MANAGE YOUR DIABETES BEFORE AND AFTER SURGERY  Why is it important to control my blood sugar before and after surgery? Improving blood sugar levels before and after  surgery helps healing and can limit problems. A way of improving blood sugar control is eating a healthy diet by:  Eating less sugar and carbohydrates  Increasing activity/exercise  Talking with your doctor about reaching your blood sugar goals High blood sugars (greater than 180 mg/dL) can raise your risk of infections and slow your recovery, so you will need to focus on controlling your diabetes during the weeks before surgery. Make sure that the doctor who takes care of your diabetes knows about your planned surgery including the date and location.  How do I manage my blood sugar before surgery? Check your blood sugar at least 4 times a day, starting 2 days before surgery, to make sure that the level is not too high or low.  Check your blood sugar the morning of your surgery when you wake up and every 2 hours until you get to the Short Stay unit.  If your blood sugar is less than 70 mg/dL, you will need to treat for low blood sugar: Do not take insulin. Treat a low blood sugar (less than 70 mg/dL) with  cup of clear juice (cranberry or apple), 4 glucose tablets, OR glucose gel. Recheck blood sugar in 15 minutes after treatment (to make sure it is greater than 70 mg/dL). If your blood sugar is not greater than 70 mg/dL on recheck, call [phone] for further instructions. Report your blood sugar to the short stay nurse when you get to Short Stay.  If you are admitted to the hospital after surgery: Your blood sugar will be checked by the staff and you will probably be given insulin after surgery (instead  of oral diabetes medicines) to make sure you have good blood sugar levels. The goal for blood sugar control after surgery is 80-180 mg/dL.                      Do NOT Smoke (Tobacco/Vaping) or drink Alcohol 24 hours prior to your procedure.  If you use a CPAP at night, you may bring all equipment for your overnight stay.   Contacts, glasses, piercing's, hearing aid's, dentures or  partials may not be worn into surgery, please bring cases for these belongings.    For patients admitted to the hospital, discharge time will be determined by your treatment team.   Patients discharged the day of surgery will not be allowed to drive home, and someone needs to stay with them for 24 hours.  ONLY 1 SUPPORT PERSON MAY BE PRESENT WHILE YOU ARE IN SURGERY. IF YOU ARE TO BE ADMITTED ONCE YOU ARE IN YOUR ROOM YOU WILL BE ALLOWED TWO (2) VISITORS.  Minor children may have two parents present. Special consideration for safety and communication needs will be reviewed on a case by case basis.   Special instructions:   [facility]- Preparing For Surgery  Before surgery, you can play an important role. Because skin is not sterile, your skin needs to be as free of germs as possible. You can reduce the number of germs on your skin by washing with CHG (chlorahexidine gluconate) Soap before surgery.  CHG is an antiseptic cleaner which kills germs and bonds with the skin to continue killing germs even after washing.    Oral Hygiene is also important to reduce your risk of infection.  Remember - BRUSH YOUR TEETH THE MORNING OF SURGERY WITH YOUR REGULAR TOOTHPASTE  Please do not use if you have an allergy to CHG or antibacterial soaps. If your skin becomes reddened/irritated stop using the CHG.  Do not shave (including legs and underarms) for at least 48 hours prior to first CHG shower. It is OK to shave your face.  Please follow these instructions carefully.   Shower the NIGHT BEFORE SURGERY and the MORNING OF SURGERY  If you chose to wash your hair, wash your hair first as usual with your normal shampoo.  After you shampoo, rinse your hair and body thoroughly to remove the shampoo.  Use CHG Soap as you would any other liquid soap. You can apply CHG directly to the skin and wash gently with a scrungie or a clean washcloth.   Apply the CHG Soap to your body ONLY FROM THE NECK DOWN.  Do not  use on open wounds or open sores. Avoid contact with your eyes, ears, mouth and genitals (private parts). Wash Face and genitals (private parts)  with your normal soap.   Wash thoroughly, paying special attention to the area where your surgery will be performed.  Thoroughly rinse your body with warm water from the neck down.  DO NOT shower/wash with your normal soap after using and rinsing off the CHG Soap.  Pat yourself dry with a CLEAN TOWEL.  Wear CLEAN PAJAMAS to bed the night before surgery  Place CLEAN SHEETS on your bed the night before your surgery  DO NOT SLEEP WITH PETS.   Day of Surgery: Shower with CHG soap. Do not wear jewelry, make up, nail polish, gel polish, artificial nails, or any other type of covering on natural nails including finger and toenails. If patients have artificial nails, gel coating, etc. that need  to be removed by a nail salon please have this removed prior to surgery. Surgery may need to be canceled/delayed if the surgeon/ anesthesia feels like the patient is unable to be adequately monitored. Do not wear lotions, powders, perfumes/colognes, or deodorant. Do not shave 48 hours prior to surgery.  Men may shave face and neck. Do not bring valuables to the hospital. [facility] is not responsible for any belongings or valuables. Wear Clean/Comfortable clothing the morning of surgery Remember to brush your teeth WITH YOUR REGULAR TOOTHPASTE.   Please read over the following fact sheets that you were given.

## 2020-08-28 ENCOUNTER — Other Ambulatory Visit: Payer: Self-pay

## 2020-08-28 ENCOUNTER — Encounter (HOSPITAL_COMMUNITY): Payer: Self-pay

## 2020-08-28 ENCOUNTER — Encounter (HOSPITAL_COMMUNITY)
Admission: RE | Admit: 2020-08-28 | Discharge: 2020-08-28 | Disposition: A | Discharge status: Home / Self Care | Payer: 59 | Source: Ambulatory Visit | Attending: Neurological Surgery | Admitting: Neurological Surgery

## 2020-08-28 DIAGNOSIS — Z7902 Long term (current) use of antithrombotics/antiplatelets: Secondary | ICD-10-CM | POA: Insufficient documentation

## 2020-08-28 DIAGNOSIS — D320 Benign neoplasm of cerebral meninges: Secondary | ICD-10-CM | POA: Diagnosis not present

## 2020-08-28 DIAGNOSIS — I10 Essential (primary) hypertension: Secondary | ICD-10-CM | POA: Diagnosis not present

## 2020-08-28 DIAGNOSIS — Z9989 Dependence on other enabling machines and devices: Secondary | ICD-10-CM | POA: Diagnosis not present

## 2020-08-28 DIAGNOSIS — I2510 Atherosclerotic heart disease of native coronary artery without angina pectoris: Secondary | ICD-10-CM | POA: Insufficient documentation

## 2020-08-28 DIAGNOSIS — Z20822 Contact with and (suspected) exposure to covid-19: Secondary | ICD-10-CM | POA: Insufficient documentation

## 2020-08-28 DIAGNOSIS — Z01812 Encounter for preprocedural laboratory examination: Secondary | ICD-10-CM | POA: Diagnosis present

## 2020-08-28 DIAGNOSIS — G4733 Obstructive sleep apnea (adult) (pediatric): Secondary | ICD-10-CM | POA: Insufficient documentation

## 2020-08-28 DIAGNOSIS — Z951 Presence of aortocoronary bypass graft: Secondary | ICD-10-CM | POA: Diagnosis not present

## 2020-08-28 HISTORY — DX: Dyspnea, unspecified: R06.00

## 2020-08-28 HISTORY — DX: Pneumonia, unspecified organism: J18.9

## 2020-08-28 HISTORY — DX: Schizophrenia, unspecified: F20.9

## 2020-08-28 LAB — BASIC METABOLIC PANEL
Anion gap: 6 (ref 5–15)
BUN: 9 mg/dL (ref 6–20)
CO2: 27 mmol/L (ref 22–32)
Calcium: 9.2 mg/dL (ref 8.9–10.3)
Chloride: 107 mmol/L (ref 98–111)
Creatinine, Ser: 0.91 mg/dL (ref 0.44–1.00)
GFR, Estimated: >60 mL/min (ref >60)
Glucose, Bld: 92 mg/dL (ref 70–99)
Potassium: 3.7 mmol/L (ref 3.5–5.1)
Sodium: 140 mmol/L (ref 135–145)

## 2020-08-28 LAB — CBC
HCT: 42.1 % (ref 36.0–46.0)
Hemoglobin: 13.5 g/dL (ref 12.0–15.0)
MCH: 29.3 pg (ref 26.0–34.0)
MCHC: 32.1 g/dL (ref 30.0–36.0)
MCV: 91.3 fL (ref 80.0–100.0)
Platelets: 370 10*3/uL (ref 150–400)
RBC: 4.61 MIL/uL (ref 3.87–5.11)
RDW: 15.7 % — ABNORMAL HIGH (ref 11.5–15.5)
WBC: 9.9 10*3/uL (ref 4.0–10.5)
nRBC: 0 % (ref 0.0–0.2)

## 2020-08-28 LAB — GLUCOSE, CAPILLARY: Glucose-Capillary: 106 mg/dL — ABNORMAL HIGH (ref 70–99)

## 2020-08-28 LAB — SARS CORONAVIRUS 2 (TAT 6-24 HRS): SARS Coronavirus 2: NEGATIVE

## 2020-08-28 NOTE — Progress Notes (Signed)
PCP - Fredrich Romans, PA Cardiologist - [facility] Dr. Acie Fredrickson  Chest x-ray - Not indicated EKG - 07/29/20 Stress Test - 04/06/2015 ECHO - 01/13/2016 Cardiac Cath - 11/29/2017  Sleep Study - Yes has OSA CPAP - Normally uses nightly but not fitting correctly   DM - Type II Fasting Blood Sugar - CBG @ PAT appt 106 - fasting Checks Blood Sugar 3 times a week  Blood Thinner Instructions: Plavis was already stopped 7 days prior to surgery  ERAS Protcol - Yes  COVID TEST- 08/28/20  Anesthesia review:  Yes cardiac history  Patient denies shortness of breath, fever, cough and chest pain at PAT appointment   All instructions explained to the patient, with a verbal understanding of the material. Patient agrees to go over the instructions while at home for a better understanding. Patient also instructed to wear a mask in public after being tested for COVID-19. The opportunity to ask questions was provided.

## 2020-08-31 NOTE — Progress Notes (Signed)
Anesthesia Chart Review:  Follows with cardiology for history of CAD s/p CABG 2015 [facility] [place]cath 2019 shows atretic LIMA, free RIMA to diagonal was patent, SVG to distal RCA was patent; HTN; chronic chest wall pain (s/p removal of sternal wire pain).  Last seen by Dr. Acie Fredrickson on 07/29/2020 for preop evaluation.  Per note, " Sternal incision looks good.  nontneder. Refill coreg, plavix, rosuvastatin. She has been very stable.  She needs to have a craniotomy for removal of a brain mass.   She may hold her Plavix for 7 days prior to her surgery . She is at low risk for her upcoming surgical procedure. Our team will be available for cardiology consultation if needed."  OSA on CPAP.  Preop labs reviewed, unremarkable.   EKG 07/29/20: NSR. Rate 71.  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  Compared to previous MRI from 11/17/2019 there appears to be slight increase in size.  No other significant abnormalities noted.  Cath 11/29/2017: 1. Single vessel occlusive CAD. 100% RCA after the RV marginal branch. The stent in the ostial LAD is patent. 2. Atretic LIMA to the LAD 3. Patent free RIMA to the diagonal 4. Patent SVG to the distal RCA 5. Normal LV function 6. Normal LVEDP   Plan: medical management.   Recommend Aspirin '81mg'$  daily for moderate CAD.  TTE 01/13/16: - Left ventricle: The cavity size was normal. Wall thickness was    normal. Systolic function was normal. The estimated ejection    fraction was in the range of 60% to 65%. Wall motion was normal;    there were no regional wall motion abnormalities. Features are    consistent with a pseudonormal left ventricular filling pattern,    with concomitant abnormal relaxation and increased filling    pressure (grade 2  diastolic dysfunction).  - Aortic valve: There was no stenosis.  - Mitral valve: There was no significant regurgitation.  - Right ventricle: Poorly visualized. The cavity size was normal.    Systolic function was normal.  - Tricuspid valve: Peak RV-RA gradient (S): 20 mm Hg.  - Pulmonary arteries: PA peak pressure: 23 mm Hg (S).  - Inferior vena cava: The vessel was normal in size. The    respirophasic diameter changes were in the normal range (>= 50%),    consistent with normal central venous pressure.   Impressions:   - Normal LV size with EF 60-65%. Moderate diastolic dysfunction.    Normal RV size and systolic function. No significant valvular    abnormalities.    Wynonia Musty [facility] Short Stay Center/Anesthesiology Phone [phone] 08/31/2020 10:25 AM

## 2020-08-31 NOTE — Anesthesia Preprocedure Evaluation (Addendum)
Anesthesia Evaluation  Patient identified by MRN, date of birth, ID band Patient awake    Reviewed: Allergy & Precautions, NPO status , Patient's Chart, lab work & pertinent test results, reviewed documented beta blocker date and time   Airway Mallampati: II  TM Distance: >3 FB Neck ROM: Full    Dental  (+) Poor Dentition, Missing, Dental Advisory Given,    Pulmonary shortness of breath and with exertion, asthma , sleep apnea and Continuous Positive Airway Pressure Ventilation , COPD,  COPD inhaler, former smoker,  Quit smoking 2015   Pulmonary exam normal breath sounds clear to auscultation       Cardiovascular hypertension, Pt. on medications and Pt. on home beta blockers + CAD, + Past MI (2015), + Cardiac Stents (plavix- last dose: 7d ago), + CABG (2015) and +CHF (grade 2 diastolic dysfunction)  Normal cardiovascular exam Rhythm:Regular Rate:Normal  Echo 2017: - Left ventricle: The cavity size was normal. Wall thickness was  normal. Systolic function was normal. The estimated ejection  fraction was in the range of 60% to 65%. Wall motion was normal;  there were no regional wall motion abnormalities. Features are  consistent with a pseudonormal left ventricular filling pattern,  with concomitant abnormal relaxation and increased filling  pressure (grade 2 diastolic dysfunction).  - Aortic valve: There was no stenosis.  - Mitral valve: There was no significant regurgitation.  - Right ventricle: Poorly visualized. The cavity size was normal.  Systolic function was normal.  - Tricuspid valve: Peak RV-RA gradient (S): 20 mm Hg.  - Pulmonary arteries: PA peak pressure: 23 mm Hg (S).  - Inferior vena cava: The vessel was normal in size. The  respirophasic diameter changes were in the normal range (>= 50%),  consistent with normal central venous pressure.   Stress test 2017: ? Nuclear stress EF: 64%. ? There was  no ST segment deviation noted during stress. ? No T wave inversion was noted during stress. ? Defect 1: There is a small defect of mild severity present in the basal anterolateral and mid anterolateral location. ? Findings consistent with ischemia. ? This is a low risk study.   Low risk stress nuclear study with a small area of mild lateral wall ischemia and normal left ventricular regional and global systolic function.    Cath 2019: 1. Single vessel occlusive CAD. 100% RCA after the RV marginal branch. The stent in the ostial LAD is patent. 2. Atretic LIMA to the LAD 3. Patent free RIMA to the diagonal 4. Patent SVG to the distal RCA 5. Normal LV function 6. 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  CVA (2010), No Residual Symptoms    GI/Hepatic hiatal hernia, GERD  Medicated and Controlled,(+)     substance abuse  marijuana use,   Endo/Other  diabetes, Well Controlled, Type 2, Oral Hypoglycemic AgentsHypothyroidism   Renal/GU negative Renal ROS     Musculoskeletal  (+) Arthritis , Osteoarthritis,    Abdominal   Peds  Hematology negative hematology ROS (+) hct 42.1   Anesthesia Other Findings   Reproductive/Obstetrics                           Anesthesia Physical Anesthesia Plan  ASA: 3  Anesthesia Plan: General  Post-op Pain Management:    Induction: Intravenous  PONV Risk Score and Plan: 3 and Ondansetron, Dexamethasone, Midazolam, Treatment may vary due to age or medical condition and Aprepitant  Airway Management Planned: Oral ETT  Additional Equipment: Arterial line  Intra-op Plan:   Post-operative Plan: Extubation in OR  Informed Consent: I have reviewed the patients History  and Physical, chart, labs and discussed the procedure including the risks, benefits and alternatives for the proposed anesthesia with the patient or authorized representative who has indicated his/her understanding and acceptance.     Dental advisory given  Plan Discussed with: CRNA  Anesthesia Plan Comments:       Anesthesia Quick Evaluation

## 2020-09-01 ENCOUNTER — Other Ambulatory Visit: Payer: Self-pay

## 2020-09-01 ENCOUNTER — Inpatient Hospital Stay (HOSPITAL_COMMUNITY)
Admission: RE | Admit: 2020-09-01 | Discharge: 2020-09-07 | DRG: 026 | Disposition: A | Discharge status: Rehabilitation Facility | Payer: 59 | Attending: Neurological Surgery | Admitting: Neurological Surgery

## 2020-09-01 ENCOUNTER — Encounter (HOSPITAL_COMMUNITY): Payer: Self-pay | Admitting: Neurological Surgery

## 2020-09-01 ENCOUNTER — Inpatient Hospital Stay (HOSPITAL_COMMUNITY): Payer: 59 | Admitting: Physician Assistant

## 2020-09-01 ENCOUNTER — Inpatient Hospital Stay (HOSPITAL_COMMUNITY): Payer: 59 | Admitting: Certified Registered"

## 2020-09-01 ENCOUNTER — Encounter (HOSPITAL_COMMUNITY)
Admission: RE | Disposition: A | Discharge status: Rehabilitation Facility | Payer: Self-pay | Source: Home / Self Care | Attending: Neurological Surgery

## 2020-09-01 DIAGNOSIS — D496 Neoplasm of unspecified behavior of brain: Secondary | ICD-10-CM | POA: Diagnosis present

## 2020-09-01 DIAGNOSIS — Z833 Family history of diabetes mellitus: Secondary | ICD-10-CM | POA: Diagnosis not present

## 2020-09-01 DIAGNOSIS — Z79899 Other long term (current) drug therapy: Secondary | ICD-10-CM | POA: Diagnosis not present

## 2020-09-01 DIAGNOSIS — K5900 Constipation, unspecified: Secondary | ICD-10-CM | POA: Diagnosis not present

## 2020-09-01 DIAGNOSIS — B009 Herpesviral infection, unspecified: Secondary | ICD-10-CM | POA: Diagnosis not present

## 2020-09-01 DIAGNOSIS — Z79890 Hormone replacement therapy (postmenopausal): Secondary | ICD-10-CM

## 2020-09-01 DIAGNOSIS — Z87891 Personal history of nicotine dependence: Secondary | ICD-10-CM | POA: Diagnosis not present

## 2020-09-01 DIAGNOSIS — R001 Bradycardia, unspecified: Secondary | ICD-10-CM | POA: Diagnosis not present

## 2020-09-01 DIAGNOSIS — D72828 Other elevated white blood cell count: Secondary | ICD-10-CM | POA: Diagnosis not present

## 2020-09-01 DIAGNOSIS — F3162 Bipolar disorder, current episode mixed, moderate: Secondary | ICD-10-CM | POA: Diagnosis not present

## 2020-09-01 DIAGNOSIS — R202 Paresthesia of skin: Secondary | ICD-10-CM | POA: Diagnosis not present

## 2020-09-01 DIAGNOSIS — J45909 Unspecified asthma, uncomplicated: Secondary | ICD-10-CM | POA: Diagnosis present

## 2020-09-01 DIAGNOSIS — I5032 Chronic diastolic (congestive) heart failure: Secondary | ICD-10-CM | POA: Diagnosis present

## 2020-09-01 DIAGNOSIS — Z955 Presence of coronary angioplasty implant and graft: Secondary | ICD-10-CM | POA: Diagnosis not present

## 2020-09-01 DIAGNOSIS — F209 Schizophrenia, unspecified: Secondary | ICD-10-CM | POA: Diagnosis not present

## 2020-09-01 DIAGNOSIS — H9191 Unspecified hearing loss, right ear: Secondary | ICD-10-CM | POA: Diagnosis present

## 2020-09-01 DIAGNOSIS — F3161 Bipolar disorder, current episode mixed, mild: Secondary | ICD-10-CM | POA: Diagnosis not present

## 2020-09-01 DIAGNOSIS — Z951 Presence of aortocoronary bypass graft: Secondary | ICD-10-CM

## 2020-09-01 DIAGNOSIS — Z880 Allergy status to penicillin: Secondary | ICD-10-CM

## 2020-09-01 DIAGNOSIS — E785 Hyperlipidemia, unspecified: Secondary | ICD-10-CM | POA: Diagnosis present

## 2020-09-01 DIAGNOSIS — I700 Atherosclerosis of aorta: Secondary | ICD-10-CM | POA: Diagnosis not present

## 2020-09-01 DIAGNOSIS — G4733 Obstructive sleep apnea (adult) (pediatric): Secondary | ICD-10-CM | POA: Diagnosis present

## 2020-09-01 DIAGNOSIS — I110 Hypertensive heart disease with heart failure: Secondary | ICD-10-CM | POA: Diagnosis present

## 2020-09-01 DIAGNOSIS — Z9889 Other specified postprocedural states: Secondary | ICD-10-CM | POA: Diagnosis not present

## 2020-09-01 DIAGNOSIS — E119 Type 2 diabetes mellitus without complications: Secondary | ICD-10-CM | POA: Diagnosis present

## 2020-09-01 DIAGNOSIS — Z8249 Family history of ischemic heart disease and other diseases of the circulatory system: Secondary | ICD-10-CM | POA: Diagnosis not present

## 2020-09-01 DIAGNOSIS — H532 Diplopia: Secondary | ICD-10-CM | POA: Diagnosis present

## 2020-09-01 DIAGNOSIS — D329 Benign neoplasm of meninges, unspecified: Principal | ICD-10-CM | POA: Diagnosis present

## 2020-09-01 DIAGNOSIS — Z7984 Long term (current) use of oral hypoglycemic drugs: Secondary | ICD-10-CM

## 2020-09-01 DIAGNOSIS — E039 Hypothyroidism, unspecified: Secondary | ICD-10-CM | POA: Diagnosis present

## 2020-09-01 DIAGNOSIS — Z888 Allergy status to other drugs, medicaments and biological substances status: Secondary | ICD-10-CM

## 2020-09-01 DIAGNOSIS — K219 Gastro-esophageal reflux disease without esophagitis: Secondary | ICD-10-CM | POA: Diagnosis present

## 2020-09-01 DIAGNOSIS — J449 Chronic obstructive pulmonary disease, unspecified: Secondary | ICD-10-CM | POA: Diagnosis present

## 2020-09-01 DIAGNOSIS — G936 Cerebral edema: Secondary | ICD-10-CM | POA: Diagnosis present

## 2020-09-01 DIAGNOSIS — I252 Old myocardial infarction: Secondary | ICD-10-CM

## 2020-09-01 DIAGNOSIS — E669 Obesity, unspecified: Secondary | ICD-10-CM | POA: Diagnosis not present

## 2020-09-01 DIAGNOSIS — E118 Type 2 diabetes mellitus with unspecified complications: Secondary | ICD-10-CM | POA: Diagnosis not present

## 2020-09-01 DIAGNOSIS — I10 Essential (primary) hypertension: Secondary | ICD-10-CM | POA: Diagnosis not present

## 2020-09-01 DIAGNOSIS — R45851 Suicidal ideations: Secondary | ICD-10-CM | POA: Diagnosis not present

## 2020-09-01 DIAGNOSIS — D72829 Elevated white blood cell count, unspecified: Secondary | ICD-10-CM | POA: Diagnosis not present

## 2020-09-01 DIAGNOSIS — K5901 Slow transit constipation: Secondary | ICD-10-CM | POA: Diagnosis not present

## 2020-09-01 DIAGNOSIS — Z8673 Personal history of transient ischemic attack (TIA), and cerebral infarction without residual deficits: Secondary | ICD-10-CM | POA: Diagnosis not present

## 2020-09-01 DIAGNOSIS — I2510 Atherosclerotic heart disease of native coronary artery without angina pectoris: Secondary | ICD-10-CM | POA: Diagnosis present

## 2020-09-01 DIAGNOSIS — Z91040 Latex allergy status: Secondary | ICD-10-CM | POA: Diagnosis not present

## 2020-09-01 DIAGNOSIS — F319 Bipolar disorder, unspecified: Secondary | ICD-10-CM | POA: Diagnosis present

## 2020-09-01 DIAGNOSIS — R200 Anesthesia of skin: Secondary | ICD-10-CM | POA: Diagnosis not present

## 2020-09-01 DIAGNOSIS — B029 Zoster without complications: Secondary | ICD-10-CM | POA: Diagnosis not present

## 2020-09-01 DIAGNOSIS — E1169 Type 2 diabetes mellitus with other specified complication: Secondary | ICD-10-CM | POA: Diagnosis not present

## 2020-09-01 DIAGNOSIS — Z86018 Personal history of other benign neoplasm: Secondary | ICD-10-CM | POA: Diagnosis not present

## 2020-09-01 DIAGNOSIS — Z7902 Long term (current) use of antithrombotics/antiplatelets: Secondary | ICD-10-CM

## 2020-09-01 DIAGNOSIS — R2689 Other abnormalities of gait and mobility: Secondary | ICD-10-CM | POA: Diagnosis not present

## 2020-09-01 DIAGNOSIS — F3130 Bipolar disorder, current episode depressed, mild or moderate severity, unspecified: Secondary | ICD-10-CM | POA: Diagnosis not present

## 2020-09-01 HISTORY — PX: APPLICATION OF CRANIAL NAVIGATION: SHX6578

## 2020-09-01 HISTORY — PX: CRANIOTOMY: SHX93

## 2020-09-01 HISTORY — PX: PLACEMENT OF LUMBAR DRAIN: SHX6028

## 2020-09-01 LAB — POCT I-STAT 7, (LYTES, BLD GAS, ICA,H+H)
Acid-Base Excess: 4 mmol/L — ABNORMAL HIGH (ref 0.0–2.0)
Acid-Base Excess: 2 mmol/L (ref 0.0–2.0)
Bicarbonate: 28.3 mmol/L — ABNORMAL HIGH (ref 20.0–28.0)
Bicarbonate: 25.5 mmol/L (ref 20.0–28.0)
Calcium, Ion: 1.24 mmol/L (ref 1.15–1.40)
Calcium, Ion: 1.17 mmol/L (ref 1.15–1.40)
HCT: 34 % — ABNORMAL LOW (ref 36.0–46.0)
HCT: 34 % — ABNORMAL LOW (ref 36.0–46.0)
Hemoglobin: 11.6 g/dL — ABNORMAL LOW (ref 12.0–15.0)
Hemoglobin: 11.6 g/dL — ABNORMAL LOW (ref 12.0–15.0)
O2 Saturation: 99 %
O2 Saturation: 99 %
Patient temperature: 34.3
Patient temperature: 35
Potassium: 3.6 mmol/L (ref 3.5–5.1)
Potassium: 3.9 mmol/L (ref 3.5–5.1)
Sodium: 142 mmol/L (ref 135–145)
Sodium: 143 mmol/L (ref 135–145)
TCO2: 30 mmol/L (ref 22–32)
TCO2: 27 mmol/L (ref 22–32)
pCO2 arterial: 37.7 mmHg (ref 32.0–48.0)
pCO2 arterial: 33.2 mmHg (ref 32.0–48.0)
pH, Arterial: 7.472 — ABNORMAL HIGH (ref 7.350–7.450)
pH, Arterial: 7.486 — ABNORMAL HIGH (ref 7.350–7.450)
pO2, Arterial: 141 mmHg — ABNORMAL HIGH (ref 83.0–108.0)
pO2, Arterial: 104 mmHg (ref 83.0–108.0)

## 2020-09-01 LAB — CREATININE, SERUM
Creatinine, Ser: 0.73 mg/dL (ref 0.44–1.00)
GFR, Estimated: >60 mL/min (ref >60)

## 2020-09-01 LAB — CBC
HCT: 38 % (ref 36.0–46.0)
Hemoglobin: 11.9 g/dL — ABNORMAL LOW (ref 12.0–15.0)
MCH: 28.6 pg (ref 26.0–34.0)
MCHC: 31.3 g/dL (ref 30.0–36.0)
MCV: 91.3 fL (ref 80.0–100.0)
Platelets: 370 10*3/uL (ref 150–400)
RBC: 4.16 MIL/uL (ref 3.87–5.11)
RDW: 15.8 % — ABNORMAL HIGH (ref 11.5–15.5)
WBC: 9.3 10*3/uL (ref 4.0–10.5)
nRBC: 0 % (ref 0.0–0.2)

## 2020-09-01 LAB — GLUCOSE, CAPILLARY
Glucose-Capillary: 110 mg/dL — ABNORMAL HIGH (ref 70–99)
Glucose-Capillary: 132 mg/dL — ABNORMAL HIGH (ref 70–99)
Glucose-Capillary: 150 mg/dL — ABNORMAL HIGH (ref 70–99)

## 2020-09-01 LAB — MRSA NEXT GEN BY PCR, NASAL: MRSA by PCR Next Gen: NOT DETECTED

## 2020-09-01 LAB — PREPARE RBC (CROSSMATCH)

## 2020-09-01 LAB — ABO/RH: ABO/RH(D): A POS

## 2020-09-01 SURGERY — CRANIOTOMY TUMOR EXCISION
Anesthesia: General | Laterality: Right

## 2020-09-01 MED ORDER — FENTANYL CITRATE (PF) 250 MCG/5ML IJ SOLN
INTRAMUSCULAR | Status: AC
  Filled 2020-09-01: qty 5

## 2020-09-01 MED ORDER — GLYCOPYRROLATE PF 0.2 MG/ML IJ SOSY
PREFILLED_SYRINGE | INTRAMUSCULAR | Status: AC
  Filled 2020-09-01: qty 1

## 2020-09-01 MED ORDER — ALBUTEROL SULFATE HFA 108 (90 BASE) MCG/ACT IN AERS
INHALATION_SPRAY | RESPIRATORY_TRACT | Status: DC | PRN
  Administered 2020-09-01: 8 via RESPIRATORY_TRACT

## 2020-09-01 MED ORDER — INSULIN ASPART 100 UNIT/ML IJ SOLN: 0.0000 [IU] | Freq: Every day | INTRAMUSCULAR | Status: DC

## 2020-09-01 MED ORDER — ROCURONIUM BROMIDE 100 MG/10ML IV SOLN
INTRAVENOUS | Status: DC | PRN
  Administered 2020-09-01: 100 mg via INTRAVENOUS

## 2020-09-01 MED ORDER — LIDOCAINE-EPINEPHRINE 1 %-1:100000 IJ SOLN
INTRAMUSCULAR | Status: DC | PRN
  Administered 2020-09-01: 10 mL

## 2020-09-01 MED ORDER — SODIUM CHLORIDE 0.9 % IV SOLN
0.0125 ug/kg/min | INTRAVENOUS | Status: DC
  Administered 2020-09-01: .2 ug/kg/min via INTRAVENOUS
  Filled 2020-09-01: qty 2000

## 2020-09-01 MED ORDER — THROMBIN 20000 UNITS EX SOLR
CUTANEOUS | Status: AC
  Filled 2020-09-01: qty 20000

## 2020-09-01 MED ORDER — DOCUSATE SODIUM 100 MG PO CAPS
100.0000 mg | ORAL_CAPSULE | Freq: Two times a day (BID) | ORAL | Status: DC
  Administered 2020-09-02 – 2020-09-07 (×11): 100 mg via ORAL
  Filled 2020-09-01 (×11): qty 1

## 2020-09-01 MED ORDER — PANTOPRAZOLE SODIUM 40 MG PO TBEC
40.0000 mg | DELAYED_RELEASE_TABLET | Freq: Every day | ORAL | Status: DC
  Administered 2020-09-02 – 2020-09-07 (×6): 40 mg via ORAL
  Filled 2020-09-01 (×6): qty 1

## 2020-09-01 MED ORDER — ONDANSETRON HCL 4 MG/2ML IJ SOLN
INTRAMUSCULAR | Status: AC
  Filled 2020-09-01: qty 2

## 2020-09-01 MED ORDER — ONDANSETRON HCL 4 MG/2ML IJ SOLN
INTRAMUSCULAR | Status: DC | PRN
  Administered 2020-09-01 (×2): 4 mg via INTRAVENOUS

## 2020-09-01 MED ORDER — BACITRACIN ZINC 500 UNIT/GM EX OINT
TOPICAL_OINTMENT | CUTANEOUS | Status: DC | PRN
  Administered 2020-09-01 (×2): 1 via TOPICAL

## 2020-09-01 MED ORDER — FENTANYL CITRATE (PF) 250 MCG/5ML IJ SOLN
INTRAMUSCULAR | Status: DC | PRN
  Administered 2020-09-01: 50 ug via INTRAVENOUS

## 2020-09-01 MED ORDER — HEMOSTATIC AGENTS (NO CHARGE) OPTIME
TOPICAL | Status: DC | PRN
  Administered 2020-09-01: 1 via TOPICAL

## 2020-09-01 MED ORDER — THROMBIN 5000 UNITS EX SOLR
CUTANEOUS | Status: AC
  Filled 2020-09-01: qty 5000

## 2020-09-01 MED ORDER — EPHEDRINE SULFATE 50 MG/ML IJ SOLN
INTRAMUSCULAR | Status: DC | PRN
  Administered 2020-09-01: 10 mg via INTRAVENOUS

## 2020-09-01 MED ORDER — HYDROMORPHONE HCL 1 MG/ML IJ SOLN
0.5000 mg | INTRAMUSCULAR | Status: DC | PRN
  Administered 2020-09-01 – 2020-09-07 (×19): 0.5 mg via INTRAVENOUS
  Filled 2020-09-01 (×5): qty 0.5
  Filled 2020-09-01: qty 1
  Filled 2020-09-01 (×2): qty 0.5
  Filled 2020-09-01: qty 1
  Filled 2020-09-01 (×3): qty 0.5
  Filled 2020-09-01 (×2): qty 1
  Filled 2020-09-01 (×7): qty 0.5

## 2020-09-01 MED ORDER — CARVEDILOL 3.125 MG PO TABS
3.1250 mg | ORAL_TABLET | Freq: Two times a day (BID) | ORAL | Status: DC
  Administered 2020-09-01 – 2020-09-07 (×11): 3.125 mg via ORAL
  Filled 2020-09-01 (×12): qty 1

## 2020-09-01 MED ORDER — HYDROMORPHONE HCL 1 MG/ML IJ SOLN
0.2500 mg | INTRAMUSCULAR | Status: DC | PRN
  Administered 2020-09-01 (×3): 0.5 mg via INTRAVENOUS

## 2020-09-01 MED ORDER — BACLOFEN 10 MG PO TABS
10.0000 mg | ORAL_TABLET | Freq: Every day | ORAL | Status: DC | PRN
  Administered 2020-09-02 – 2020-09-06 (×4): 10 mg via ORAL
  Filled 2020-09-01 (×6): qty 1

## 2020-09-01 MED ORDER — SODIUM CHLORIDE (PF) 0.9 % IJ SOLN
INTRAMUSCULAR | Status: DC | PRN
  Administered 2020-09-01: 1000 mL

## 2020-09-01 MED ORDER — HYDROCODONE-ACETAMINOPHEN 5-325 MG PO TABS
1.0000 | ORAL_TABLET | ORAL | Status: DC | PRN
  Administered 2020-09-01 – 2020-09-05 (×14): 1 via ORAL
  Filled 2020-09-01 (×17): qty 1

## 2020-09-01 MED ORDER — ACETAMINOPHEN 500 MG PO TABS
ORAL_TABLET | ORAL | Status: AC
  Administered 2020-09-01: 1000 mg via ORAL
  Filled 2020-09-01: qty 2

## 2020-09-01 MED ORDER — HYDROXYZINE HCL 25 MG PO TABS
50.0000 mg | ORAL_TABLET | Freq: Every day | ORAL | Status: DC | PRN
  Administered 2020-09-01 – 2020-09-03 (×3): 50 mg via ORAL
  Filled 2020-09-01: qty 2
  Filled 2020-09-01 (×3): qty 1
  Filled 2020-09-01: qty 2

## 2020-09-01 MED ORDER — APREPITANT 40 MG PO CAPS: 40.0000 mg | ORAL_CAPSULE | Freq: Once | ORAL | Status: AC

## 2020-09-01 MED ORDER — MEPERIDINE HCL 25 MG/ML IJ SOLN: 6.2500 mg | INTRAMUSCULAR | Status: DC | PRN

## 2020-09-01 MED ORDER — OXYCODONE HCL 5 MG PO TABS: 5.0000 mg | ORAL_TABLET | Freq: Once | ORAL | Status: DC | PRN

## 2020-09-01 MED ORDER — ALPRAZOLAM 0.5 MG PO TABS
0.5000 mg | ORAL_TABLET | Freq: Two times a day (BID) | ORAL | Status: DC | PRN
  Administered 2020-09-01 – 2020-09-07 (×9): 0.5 mg via ORAL
  Filled 2020-09-01 (×9): qty 1

## 2020-09-01 MED ORDER — PROPOFOL 10 MG/ML IV BOLUS
INTRAVENOUS | Status: AC
  Filled 2020-09-01: qty 40

## 2020-09-01 MED ORDER — AMLODIPINE BESYLATE 10 MG PO TABS
10.0000 mg | ORAL_TABLET | Freq: Every day | ORAL | Status: DC
  Administered 2020-09-02 – 2020-09-07 (×6): 10 mg via ORAL
  Filled 2020-09-01 (×6): qty 1

## 2020-09-01 MED ORDER — ROCURONIUM BROMIDE 10 MG/ML (PF) SYRINGE
PREFILLED_SYRINGE | INTRAVENOUS | Status: AC
  Filled 2020-09-01: qty 10

## 2020-09-01 MED ORDER — ROSUVASTATIN CALCIUM 20 MG PO TABS
20.0000 mg | ORAL_TABLET | Freq: Every day | ORAL | Status: DC
  Administered 2020-09-02 – 2020-09-07 (×6): 20 mg via ORAL
  Filled 2020-09-01 (×6): qty 1

## 2020-09-01 MED ORDER — PROMETHAZINE HCL 25 MG/ML IJ SOLN: 6.2500 mg | INTRAMUSCULAR | Status: DC | PRN

## 2020-09-01 MED ORDER — GLYCOPYRROLATE PF 0.2 MG/ML IJ SOSY
PREFILLED_SYRINGE | INTRAMUSCULAR | Status: DC | PRN
  Administered 2020-09-01: .2 mg via INTRAVENOUS

## 2020-09-01 MED ORDER — DEXAMETHASONE SODIUM PHOSPHATE 10 MG/ML IJ SOLN
INTRAMUSCULAR | Status: AC
  Filled 2020-09-01: qty 1

## 2020-09-01 MED ORDER — AMISULPRIDE (ANTIEMETIC) 5 MG/2ML IV SOLN: 10.0000 mg | Freq: Once | INTRAVENOUS | Status: DC | PRN

## 2020-09-01 MED ORDER — ACETAMINOPHEN 500 MG PO TABS: 1000.0000 mg | ORAL_TABLET | Freq: Once | ORAL | Status: AC

## 2020-09-01 MED ORDER — FLUTICASONE PROPIONATE 50 MCG/ACT NA SUSP
2.0000 | Freq: Every day | NASAL | Status: DC | PRN
  Filled 2020-09-01: qty 16

## 2020-09-01 MED ORDER — HYDROMORPHONE HCL 1 MG/ML IJ SOLN
INTRAMUSCULAR | Status: AC
  Filled 2020-09-01: qty 1

## 2020-09-01 MED ORDER — ONDANSETRON HCL 4 MG PO TABS: 4.0000 mg | ORAL_TABLET | ORAL | Status: DC | PRN

## 2020-09-01 MED ORDER — ACETAMINOPHEN 325 MG PO TABS
650.0000 mg | ORAL_TABLET | ORAL | Status: DC | PRN
  Administered 2020-09-05 (×2): 650 mg via ORAL
  Filled 2020-09-01 (×2): qty 2

## 2020-09-01 MED ORDER — LEVOTHYROXINE SODIUM 112 MCG PO TABS
112.0000 ug | ORAL_TABLET | Freq: Every day | ORAL | Status: DC
  Administered 2020-09-02 – 2020-09-07 (×6): 112 ug via ORAL
  Filled 2020-09-01 (×6): qty 1

## 2020-09-01 MED ORDER — 0.9 % SODIUM CHLORIDE (POUR BTL) OPTIME
TOPICAL | Status: DC | PRN
  Administered 2020-09-01: 1000 mL
  Administered 2020-09-01: 2000 mL

## 2020-09-01 MED ORDER — ORAL CARE MOUTH RINSE: 15.0000 mL | Freq: Once | OROMUCOSAL | Status: DC

## 2020-09-01 MED ORDER — QUETIAPINE FUMARATE 100 MG PO TABS
100.0000 mg | ORAL_TABLET | Freq: Every day | ORAL | Status: DC
  Administered 2020-09-01 – 2020-09-06 (×6): 100 mg via ORAL
  Filled 2020-09-01 (×6): qty 1

## 2020-09-01 MED ORDER — CHLORHEXIDINE GLUCONATE CLOTH 2 % EX PADS: 6.0000 | MEDICATED_PAD | Freq: Once | CUTANEOUS | Status: DC

## 2020-09-01 MED ORDER — LINACLOTIDE 145 MCG PO CAPS
290.0000 ug | ORAL_CAPSULE | Freq: Every day | ORAL | Status: DC
  Administered 2020-09-04 – 2020-09-07 (×4): 290 ug via ORAL
  Filled 2020-09-01 (×7): qty 2

## 2020-09-01 MED ORDER — POLYETHYLENE GLYCOL 3350 17 G PO PACK
17.0000 g | PACK | Freq: Every day | ORAL | Status: DC | PRN
  Administered 2020-09-05 – 2020-09-06 (×2): 17 g via ORAL
  Filled 2020-09-01 (×3): qty 1

## 2020-09-01 MED ORDER — PROMETHAZINE HCL 25 MG PO TABS
12.5000 mg | ORAL_TABLET | ORAL | Status: DC | PRN
Start: 2020-09-01 — End: 2020-09-07
  Administered 2020-09-03 – 2020-09-04 (×2): 25 mg via ORAL
  Filled 2020-09-01 (×3): qty 1

## 2020-09-01 MED ORDER — PHENYLEPHRINE HCL-NACL 10-0.9 MG/250ML-% IV SOLN
INTRAVENOUS | Status: DC | PRN
Start: 2020-09-01 — End: 2020-09-01
  Administered 2020-09-01: 20 ug/min via INTRAVENOUS

## 2020-09-01 MED ORDER — LABETALOL HCL 5 MG/ML IV SOLN
10.0000 mg | INTRAVENOUS | Status: DC | PRN
  Administered 2020-09-01: 20 mg via INTRAVENOUS
  Filled 2020-09-01: qty 4

## 2020-09-01 MED ORDER — VANCOMYCIN HCL IN DEXTROSE 1-5 GM/200ML-% IV SOLN
1000.0000 mg | INTRAVENOUS | Status: AC
  Administered 2020-09-01: 1000 mg via INTRAVENOUS

## 2020-09-01 MED ORDER — FLUOXETINE HCL 20 MG PO CAPS
60.0000 mg | ORAL_CAPSULE | Freq: Every morning | ORAL | Status: DC
  Administered 2020-09-02 – 2020-09-07 (×5): 60 mg via ORAL
  Filled 2020-09-01 (×5): qty 3

## 2020-09-01 MED ORDER — HEPARIN SODIUM (PORCINE) 5000 UNIT/ML IJ SOLN
5000.0000 [IU] | Freq: Three times a day (TID) | INTRAMUSCULAR | Status: DC
  Administered 2020-09-03 – 2020-09-07 (×14): 5000 [IU] via SUBCUTANEOUS
  Filled 2020-09-01 (×14): qty 1

## 2020-09-01 MED ORDER — LORATADINE 10 MG PO TABS
10.0000 mg | ORAL_TABLET | Freq: Every day | ORAL | Status: DC
  Administered 2020-09-02 – 2020-09-07 (×6): 10 mg via ORAL
  Filled 2020-09-01 (×6): qty 1

## 2020-09-01 MED ORDER — PROPOFOL 500 MG/50ML IV EMUL
INTRAVENOUS | Status: DC | PRN
  Administered 2020-09-01: 30 mg via INTRAVENOUS
  Administered 2020-09-01: 150 ug/kg/min via INTRAVENOUS
  Administered 2020-09-01 (×2): 50 mg via INTRAVENOUS
  Administered 2020-09-01: 30 mg via INTRAVENOUS
  Administered 2020-09-01: 115 ug/kg/min via INTRAVENOUS
  Administered 2020-09-01: 20 mg via INTRAVENOUS

## 2020-09-01 MED ORDER — APREPITANT 40 MG PO CAPS
ORAL_CAPSULE | ORAL | Status: AC
  Administered 2020-09-01: 40 mg via ORAL
  Filled 2020-09-01: qty 1

## 2020-09-01 MED ORDER — LIDOCAINE 2% (20 MG/ML) 5 ML SYRINGE
INTRAMUSCULAR | Status: AC
  Filled 2020-09-01: qty 5

## 2020-09-01 MED ORDER — ALBUMIN HUMAN 5 % IV SOLN: INTRAVENOUS | Status: DC | PRN

## 2020-09-01 MED ORDER — DEXAMETHASONE SODIUM PHOSPHATE 10 MG/ML IJ SOLN
INTRAMUSCULAR | Status: DC | PRN
  Administered 2020-09-01: 10 mg via INTRAVENOUS

## 2020-09-01 MED ORDER — ONDANSETRON HCL 4 MG/2ML IJ SOLN
4.0000 mg | INTRAMUSCULAR | Status: DC | PRN
  Administered 2020-09-05: 4 mg via INTRAVENOUS
  Filled 2020-09-01: qty 2

## 2020-09-01 MED ORDER — VANCOMYCIN HCL 750 MG/150ML IV SOLN
750.0000 mg | Freq: Two times a day (BID) | INTRAVENOUS | Status: AC
  Administered 2020-09-01 – 2020-09-02 (×2): 750 mg via INTRAVENOUS
  Filled 2020-09-01 (×2): qty 150

## 2020-09-01 MED ORDER — TRIAMTERENE-HCTZ 37.5-25 MG PO TABS
1.0000 | ORAL_TABLET | Freq: Every day | ORAL | Status: DC
  Administered 2020-09-01 – 2020-09-07 (×7): 1 via ORAL
  Filled 2020-09-01 (×7): qty 1

## 2020-09-01 MED ORDER — LACTATED RINGERS IV SOLN: INTRAVENOUS | Status: DC | PRN

## 2020-09-01 MED ORDER — SODIUM CHLORIDE 0.9 % IV SOLN
0.0500 ug/kg/min | INTRAVENOUS | Status: DC
  Administered 2020-09-01: .2 ug/kg/min via INTRAVENOUS
  Filled 2020-09-01 (×2): qty 5000

## 2020-09-01 MED ORDER — MIDAZOLAM HCL 2 MG/2ML IJ SOLN
INTRAMUSCULAR | Status: AC
  Filled 2020-09-01: qty 2

## 2020-09-01 MED ORDER — THROMBIN 5000 UNITS EX SOLR
OROMUCOSAL | Status: DC | PRN
  Administered 2020-09-01: 5 mL via TOPICAL

## 2020-09-01 MED ORDER — CHLORHEXIDINE GLUCONATE 0.12 % MT SOLN
15.0000 mL | Freq: Once | OROMUCOSAL | Status: DC
  Filled 2020-09-01: qty 15

## 2020-09-01 MED ORDER — OXYCODONE HCL 5 MG/5ML PO SOLN
5.0000 mg | Freq: Once | ORAL | Status: DC | PRN
Start: 2020-09-01 — End: 2020-09-01

## 2020-09-01 MED ORDER — SUGAMMADEX SODIUM 200 MG/2ML IV SOLN
INTRAVENOUS | Status: DC | PRN
  Administered 2020-09-01: 200 mg via INTRAVENOUS

## 2020-09-01 MED ORDER — ACETAMINOPHEN 650 MG RE SUPP: 650.0000 mg | RECTAL | Status: DC | PRN

## 2020-09-01 MED ORDER — VANCOMYCIN HCL IN DEXTROSE 1-5 GM/200ML-% IV SOLN
INTRAVENOUS | Status: AC
  Filled 2020-09-01: qty 200

## 2020-09-01 MED ORDER — SODIUM CHLORIDE 0.9 % IV SOLN: INTRAVENOUS | Status: DC

## 2020-09-01 MED ORDER — THROMBIN 20000 UNITS EX SOLR
CUTANEOUS | Status: DC | PRN
  Administered 2020-09-01: 20 mL via TOPICAL

## 2020-09-01 MED ORDER — ALBUTEROL SULFATE (2.5 MG/3ML) 0.083% IN NEBU: 3.0000 mL | INHALATION_SOLUTION | Freq: Four times a day (QID) | RESPIRATORY_TRACT | Status: DC | PRN

## 2020-09-01 MED ORDER — LIDOCAINE 2% (20 MG/ML) 5 ML SYRINGE
INTRAMUSCULAR | Status: DC | PRN
  Administered 2020-09-01: 60 mg via INTRAVENOUS

## 2020-09-01 MED ORDER — LIDOCAINE-EPINEPHRINE 1 %-1:100000 IJ SOLN
INTRAMUSCULAR | Status: AC
  Filled 2020-09-01: qty 1

## 2020-09-01 MED ORDER — BACITRACIN ZINC 500 UNIT/GM EX OINT
TOPICAL_OINTMENT | CUTANEOUS | Status: AC
  Filled 2020-09-01: qty 28.35

## 2020-09-01 MED ORDER — LORAZEPAM 2 MG/ML IJ SOLN
1.0000 mg | Freq: Once | INTRAMUSCULAR | Status: DC
  Filled 2020-09-01 (×2): qty 1

## 2020-09-01 MED ORDER — PROPOFOL 10 MG/ML IV BOLUS
INTRAVENOUS | Status: DC | PRN
  Administered 2020-09-01: 30 mg via INTRAVENOUS
  Administered 2020-09-01: 40 mg via INTRAVENOUS
  Administered 2020-09-01: 130 mg via INTRAVENOUS

## 2020-09-01 MED ORDER — CHLORHEXIDINE GLUCONATE CLOTH 2 % EX PADS
6.0000 | MEDICATED_PAD | Freq: Every day | CUTANEOUS | Status: DC
  Administered 2020-09-01 – 2020-09-06 (×6): 6 via TOPICAL

## 2020-09-01 MED ORDER — INSULIN ASPART 100 UNIT/ML IJ SOLN
0.0000 [IU] | Freq: Three times a day (TID) | INTRAMUSCULAR | Status: DC
  Administered 2020-09-03 (×2): 2 [IU] via SUBCUTANEOUS
  Administered 2020-09-04 (×2): 3 [IU] via SUBCUTANEOUS
  Administered 2020-09-05: 2 [IU] via SUBCUTANEOUS
  Administered 2020-09-05: 3 [IU] via SUBCUTANEOUS
  Administered 2020-09-06: 5 [IU] via SUBCUTANEOUS
  Administered 2020-09-06: 2 [IU] via SUBCUTANEOUS
  Administered 2020-09-06: 3 [IU] via SUBCUTANEOUS
  Administered 2020-09-07: 2 [IU] via SUBCUTANEOUS
  Administered 2020-09-07 (×2): 3 [IU] via SUBCUTANEOUS

## 2020-09-01 SURGICAL SUPPLY — 121 items
ADH SKN CLS APL DERMABOND .7 (GAUZE/BANDAGES/DRESSINGS) ×2
APL SKNCLS STERI-STRIP NONHPOA (GAUZE/BANDAGES/DRESSINGS)
BAG COUNTER SPONGE SURGICOUNT (BAG) ×6 IMPLANT
BAG SPNG CNTER NS LX DISP (BAG) ×4
BAND INSRT 18 STRL LF DISP RB (MISCELLANEOUS) ×4
BAND RUBBER #18 3X1/16 STRL (MISCELLANEOUS) ×6 IMPLANT
BENZOIN TINCTURE PRP APPL 2/3 (GAUZE/BANDAGES/DRESSINGS) IMPLANT
BLADE CLIPPER SURG (BLADE) ×3 IMPLANT
BLADE SAW GIGLI 16 STRL (MISCELLANEOUS) IMPLANT
BLADE SURG 15 STRL LF DISP TIS (BLADE) IMPLANT
BLADE SURG 15 STRL SS (BLADE)
BNDG CMPR 75X41 PLY HI ABS (GAUZE/BANDAGES/DRESSINGS)
BNDG GAUZE ELAST 4 BULKY (GAUZE/BANDAGES/DRESSINGS) IMPLANT
BNDG STRETCH 4X75 STRL LF (GAUZE/BANDAGES/DRESSINGS) IMPLANT
BUR ACORN 9.0 PRECISION (BURR) ×3 IMPLANT
BUR ROUND FLUTED 4 SOFT TCH (BURR) IMPLANT
BUR SABER DIAMOND 2.5 (BURR) ×3 IMPLANT
BUR SPIRAL ROUTER 2.3 (BUR) ×3 IMPLANT
CANISTER SUCT 3000ML PPV (MISCELLANEOUS) ×6 IMPLANT
CATH LUMBAR HERMETIC 14G (CATHETERS) ×2 IMPLANT
CATH VENTRIC 35X38 W/TROCAR LG (CATHETERS) IMPLANT
CATHETER LUMBAR HERMETIC 14G (CATHETERS) ×3
CLIP VESOCCLUDE MED 6/CT (CLIP) IMPLANT
CNTNR URN SCR LID CUP LEK RST (MISCELLANEOUS) ×2 IMPLANT
CONT SPEC 4OZ STRL OR WHT (MISCELLANEOUS) ×3
COVER MAYO STAND STRL (DRAPES) IMPLANT
DECANTER SPIKE VIAL GLASS SM (MISCELLANEOUS) IMPLANT
DERMABOND ADVANCED (GAUZE/BANDAGES/DRESSINGS) ×1
DERMABOND ADVANCED .7 DNX12 (GAUZE/BANDAGES/DRESSINGS) ×2 IMPLANT
DRAIN SUBARACHNOID (WOUND CARE) IMPLANT
DRAPE C-ARM 42X72 X-RAY (DRAPES) IMPLANT
DRAPE HALF SHEET 40X57 (DRAPES) ×3 IMPLANT
DRAPE LAPAROTOMY 100X72X124 (DRAPES) IMPLANT
DRAPE MICROSCOPE LEICA (MISCELLANEOUS) ×3 IMPLANT
DRAPE NEUROLOGICAL W/INCISE (DRAPES) ×3 IMPLANT
DRAPE STERI IOBAN 125X83 (DRAPES) IMPLANT
DRAPE SURG 17X23 STRL (DRAPES) ×3 IMPLANT
DRAPE WARM FLUID 44X44 (DRAPES) ×3 IMPLANT
DRSG ADAPTIC 3X8 NADH LF (GAUZE/BANDAGES/DRESSINGS) IMPLANT
DRSG TELFA 3X8 NADH (GAUZE/BANDAGES/DRESSINGS) IMPLANT
DURAPREP 26ML APPLICATOR (WOUND CARE) ×6 IMPLANT
DURAPREP 6ML APPLICATOR 50/CS (WOUND CARE) ×6 IMPLANT
ELECT REM PT RETURN 9FT ADLT (ELECTROSURGICAL) ×3
ELECTRODE REM PT RTRN 9FT ADLT (ELECTROSURGICAL) ×2 IMPLANT
EVACUATOR 1/8 PVC DRAIN (DRAIN) IMPLANT
EVACUATOR SILICONE 100CC (DRAIN) IMPLANT
FEE INTRAOP CADWELL SUPPLY NCS (MISCELLANEOUS) ×2 IMPLANT
FEE INTRAOP MONITOR IMPULS NCS (MISCELLANEOUS) ×2 IMPLANT
FORCEPS BIPOLAR SPETZLER 8 1.0 (NEUROSURGERY SUPPLIES) ×3 IMPLANT
GAUZE 4X4 16PLY ~~LOC~~+RFID DBL (SPONGE) ×6 IMPLANT
GAUZE SPONGE 4X4 12PLY STRL (GAUZE/BANDAGES/DRESSINGS) IMPLANT
GLOVE EXAM NITRILE LRG STRL (GLOVE) IMPLANT
GLOVE EXAM NITRILE XS STR PU (GLOVE) IMPLANT
GLOVE SURG ENC MOIS LTX SZ7 (GLOVE) IMPLANT
GLOVE SURG LTX SZ7.5 (GLOVE) IMPLANT
GLOVE SURG POLYISO LF SZ7.5 (GLOVE) ×9 IMPLANT
GLOVE SURG UNDER POLY LF SZ7 (GLOVE) IMPLANT
GLOVE SURG UNDER POLY LF SZ7.5 (GLOVE) ×9 IMPLANT
GOWN STRL REUS W/ TWL LRG LVL3 (GOWN DISPOSABLE) ×6 IMPLANT
GOWN STRL REUS W/ TWL XL LVL3 (GOWN DISPOSABLE) IMPLANT
GOWN STRL REUS W/TWL 2XL LVL3 (GOWN DISPOSABLE) IMPLANT
GOWN STRL REUS W/TWL LRG LVL3 (GOWN DISPOSABLE) ×9
GOWN STRL REUS W/TWL XL LVL3 (GOWN DISPOSABLE)
HEMOSTAT POWDER KIT SURGIFOAM (HEMOSTASIS) ×3 IMPLANT
HEMOSTAT SURGICEL 2X14 (HEMOSTASIS) ×3 IMPLANT
INTRAOP CADWELL SUPPLY FEE NCS (MISCELLANEOUS) ×2
INTRAOP DISP SUPPLY FEE NCS (MISCELLANEOUS) ×3
INTRAOP MONITOR FEE IMPULS NCS (MISCELLANEOUS) ×2
INTRAOP MONITOR FEE IMPULSE (MISCELLANEOUS) ×3
IV NS 1000ML (IV SOLUTION) ×3
IV NS 1000ML BAXH (IV SOLUTION) ×2 IMPLANT
KIT BASIN OR (CUSTOM PROCEDURE TRAY) ×3 IMPLANT
KIT DRAIN CSF ACCUDRAIN (MISCELLANEOUS) ×3 IMPLANT
KIT SUTURE REMOVAL HAMOT (SET/KITS/TRAYS/PACK) ×3 IMPLANT
KIT TURNOVER KIT B (KITS) ×3 IMPLANT
MARKER SKIN DUAL TIP RULER LAB (MISCELLANEOUS) ×3 IMPLANT
MARKER SPHERE PSV REFLC 13MM (MARKER) ×6 IMPLANT
NEEDLE HYPO 22GX1.5 SAFETY (NEEDLE) ×3 IMPLANT
NEEDLE HYPO 25X1 1.5 SAFETY (NEEDLE) ×3 IMPLANT
NEEDLE SPNL 18GX3.5 QUINCKE PK (NEEDLE) IMPLANT
NS IRRIG 1000ML POUR BTL (IV SOLUTION) ×9 IMPLANT
PACK CRANIOTOMY CUSTOM (CUSTOM PROCEDURE TRAY) ×3 IMPLANT
PACK LAMINECTOMY NEURO (CUSTOM PROCEDURE TRAY) IMPLANT
PAD ARMBOARD 7.5X6 YLW CONV (MISCELLANEOUS) ×9 IMPLANT
PATTIES SURGICAL .25X.25 (GAUZE/BANDAGES/DRESSINGS) IMPLANT
PATTIES SURGICAL .5 X.5 (GAUZE/BANDAGES/DRESSINGS) ×3 IMPLANT
PATTIES SURGICAL .5 X3 (DISPOSABLE) IMPLANT
PATTIES SURGICAL 1/4 X 3 (GAUZE/BANDAGES/DRESSINGS) IMPLANT
PATTIES SURGICAL 1X1 (DISPOSABLE) IMPLANT
PIN MAYFIELD SKULL DISP (PIN) ×3 IMPLANT
PLATE DOUBLE Y CMF 6H (Plate) ×9 IMPLANT
PROBE NERVBE PRASS .33 (MISCELLANEOUS) ×3 IMPLANT
SCREW UNIII AXS SD 1.5X4 (Screw) ×36 IMPLANT
SLEEVE IRRIGATION ELITE 7 (MISCELLANEOUS) ×3 IMPLANT
SPECIMEN JAR SMALL (MISCELLANEOUS) IMPLANT
SPONGE NEURO XRAY DETECT 1X3 (DISPOSABLE) IMPLANT
SPONGE SURGIFOAM ABS GEL 100 (HEMOSTASIS) ×3 IMPLANT
STAPLER VISISTAT 35W (STAPLE) ×3 IMPLANT
SUT ETHILON 3 0 FSL (SUTURE) IMPLANT
SUT ETHILON 3 0 PS 1 (SUTURE) IMPLANT
SUT MNCRL AB 3-0 PS2 18 (SUTURE) ×3 IMPLANT
SUT MNCRL AB 3-0 PS2 27 (SUTURE) ×3 IMPLANT
SUT MON AB 3-0 SH 27 (SUTURE)
SUT MON AB 3-0 SH27 (SUTURE) IMPLANT
SUT NURALON 4 0 TR CR/8 (SUTURE) ×6 IMPLANT
SUT SILK 0 TIES 10X30 (SUTURE) IMPLANT
SUT SILK 2 0 PERMA HAND 18 BK (SUTURE) IMPLANT
SUT SILK 2 0 TIES 10X30 (SUTURE) IMPLANT
SUT VIC AB 2-0 CP2 18 (SUTURE) ×6 IMPLANT
SYR CONTROL 10ML LL (SYRINGE) ×3 IMPLANT
TIP NONSTICK .5MMX23CM (INSTRUMENTS) ×3
TIP NONSTICK .5X23 (INSTRUMENTS) ×2 IMPLANT
TOWEL GREEN STERILE (TOWEL DISPOSABLE) ×3 IMPLANT
TOWEL GREEN STERILE FF (TOWEL DISPOSABLE) ×6 IMPLANT
TRAY FOL W/BAG SLVR 16FR STRL (SET/KITS/TRAYS/PACK) ×2 IMPLANT
TRAY FOLEY MTR SLVR 16FR STAT (SET/KITS/TRAYS/PACK) IMPLANT
TRAY FOLEY W/BAG SLVR 16FR LF (SET/KITS/TRAYS/PACK) ×3
TUBE CONNECTING 12X1/4 (SUCTIONS) ×3 IMPLANT
TUBING IRRIGATION (MISCELLANEOUS) ×3 IMPLANT
UNDERPAD 30X36 HEAVY ABSORB (UNDERPADS AND DIAPERS) ×3 IMPLANT
WATER STERILE IRR 1000ML POUR (IV SOLUTION) ×6 IMPLANT

## 2020-09-01 NOTE — Transfer of Care (Signed)
Immediate Anesthesia Transfer of Care Note  Patient: Lindsay Rivera  Procedure(s) Performed: Right craniotomy for tumor resection and lumbar drain placement/Brainlab (Right) PLACEMENT OF LUMBAR DRAIN APPLICATION OF CRANIAL NAVIGATION  Patient Location: PACU  Anesthesia Type:General  Level of Consciousness: drowsy  Airway & Oxygen Therapy: Patient Spontanous Breathing and Patient connected to face mask oxygen  Post-op Assessment: Report given to RN, Post -op Vital signs reviewed and stable and Patient moving all extremities X 4  Post vital signs: Reviewed and stable  Last Vitals:  Vitals Value Taken Time  BP 155/86 09/01/20 1533  Temp    Pulse 73 09/01/20 1533  Resp 20 09/01/20 1533  SpO2 100 % 09/01/20 1533  Vitals shown include unvalidated device data.  Last Pain:  Vitals:   09/01/20 0611  TempSrc:   PainSc: 0-No pain      Patients Stated Pain Goal: 4 ([id number] [phone])  Complications: No notable events documented.

## 2020-09-01 NOTE — Progress Notes (Signed)
Neurosurgery Service Post-operative progress note  Assessment & Plan: 60 y.o. woman s/p LD + Kawase for meningioma, seen in PACU, speech at baseline, Fcx4, face appears symmetric.  -admit to 4N ICU -MRI w/wo contrast today or tomorrow -diet and activity as tolerated  Judith Part  09/01/20 4:23 PM

## 2020-09-01 NOTE — Progress Notes (Signed)
Placed patient on CPAP auto titrate with full face mask.  Mask could not be tightened for best seal due to wound on head being very sensitive and sore.  Pt tolerating at this time.  RT will continue to monitor.

## 2020-09-01 NOTE — Op Note (Signed)
PATIENT: Lindsay Rivera  DAY OF SURGERY: 09/01/20   PRE-OPERATIVE DIAGNOSIS:  Right tentorial meningioma   POST-OPERATIVE DIAGNOSIS:  Same   PROCEDURE:  Placement of lumbar drain, right craniotomy with anterior petrosectomy (Kawase approach), resection of combined middle and posterior fossa meningioma, use of frameless stereotaxy and intraoperative facial nerve monitoring   SURGEON:  Surgeon(s) and Role:    Judith Part, MD - Primary   ANESTHESIA: ETGA   BRIEF HISTORY: This is a 60 year old woman who presented with a growing tentorial meningioma. We discussed options and she wished to proceed with resection. This was discussed with the patient as well as risks, benefits, and alternatives and wished to proceed with surgery.   OPERATIVE DETAIL: The patient was taken to the operating room and placed on the OR table in the supine position. Anesthesia was induced by the anesthesia team. The patient was then placed in the lateral decubitus position and the back was prepped and draped in a sterile fashion.   The lumbar drain was inserted first. After our standard time out protocol, standard landmarks were used and a Tuohy needle was advanced until CSF was obtained, after which the catheter was threaded without any resistance and with good flow of clear CSF. It was secured with a sterile dressing and the patient was then returned to the supine position.  The [place] head holder was applied to the head and a registration array was attached to the [place]. This was co-registered with the patient's preoperative imaging, the fit appeared to be acceptable. Using frameless stereotaxy, the operative trajectory was planned and the incision was marked. Hair was clipped with surgical clippers over the incision and the area was then prepped and draped in a sterile fashion.  A linear incision was placed in the right pre-auricular region just in front of the tragus and coursing superiorly. Soft tissues  were dissected and a piece of temporalis fascia was harvested for closure prior to dissection of the temporalis muscle. Retractors were placed and a standard temporal craniotomy was performed with careful attention to preserve the dura, which was intact.   Subtemporal extradural dissection was then performed. The dura was impressively adherent, making dissection more difficult. GSPN was identified and elevated posterior to anterior. Of note, the patient had a dehiscent geniculate ganglion and thin tegment. The facial nerve was easily stimulated with low settings over the dehiscence. Dissection was also more difficult as the tumor was reached, as expected. CSF was drained from the lumbar drain to assist with relaxation.  Following subtemporal dissection, landmarks were identified for Kawase's space then confirmed with stereotaxy. A 69m diamond burr with self-irrigating drill sheath was used to create an anterior petrosectomy and gain access to the posterior fossa dura. The dura was then opened parallel to the floor and split in the middle, the tumor was identified, and the dural incision was continued into the tentorium with careful attention to avoid the trochlear nerve, which was not visible. This dura was resected and the tumor was evident, the margins were confirmed with stereotaxy.   The tumor was then debulked enough to allow the margins to be dissected free. I dissected it off CN5 first and followed this plane to the pons. There was some irritation of CN7 while dissecting this portion, so after pausing and allowing the irritation to resolve, I debulked it further and then divided the tumor in half to allow manipulation of the ventral portion without causing any traction on the 7-8 complex. This  portion was freed off the brainstem followed by the posterior section and medial portion, which was adherent to some residual tentorium and difficult to dissect free. Inferiorly, I was able to remove most of the  the posterior fossa component but was obviously unable to evaluate the dura of the posterior fossa for residual due to the angle of the approach. With image guidance confirming a good resection, I confirmed good hemostasis, irrigated the wound copiously, placed the temporalis fascia as a graft to help seal the temporal floor, waxed all air cells, and placed floseal into the epidural space and replaced the bone flap with titanium plates and screws.  All instrument and sponge counts were correct, the incision was then closed in layers. The patient was then returned to anesthesia for emergence. No apparent complications at the completion of the procedure.   EBL:  324m   DRAINS: none   SPECIMENS: Right tentorial brain tumor   TJudith Part MD 09/01/20 8:01 AM

## 2020-09-01 NOTE — Anesthesia Procedure Notes (Signed)
Procedure Name: Intubation Date/Time: 09/01/2020 7:43 AM Performed by: Erick Colace, RN Pre-anesthesia Checklist: Patient identified, Emergency Drugs available, Suction available and Patient being monitored Patient Re-evaluated:Patient Re-evaluated prior to induction Oxygen Delivery Method: Circle system utilized Preoxygenation: Pre-oxygenation with 100% oxygen Induction Type: IV induction Ventilation: Mask ventilation without difficulty and Oral airway inserted - appropriate to patient size Laryngoscope Size: Mac and 4 Tube type: Oral Tube size: 7.0 mm Number of attempts: 1 Airway Equipment and Method: Stylet and Oral airway Placement Confirmation: ETT inserted through vocal cords under direct vision, positive ETCO2 and breath sounds checked- equal and bilateral Secured at: 23 cm Tube secured with: Tape Dental Injury: Teeth and Oropharynx as per pre-operative assessment

## 2020-09-01 NOTE — Anesthesia Procedure Notes (Signed)
Arterial Line Insertion Start/End7/26/2022 6:50 AM, 09/01/2020 7:00 AM Performed by: CRNA  Patient location: Pre-op. Preanesthetic checklist: patient identified, IV checked, site marked, risks and benefits discussed, surgical consent, monitors and equipment checked, pre-op evaluation, timeout performed and anesthesia consent Lidocaine 1% used for infiltration Right, radial was placed Catheter size: 20 Fr Hand hygiene performed  and maximum sterile barriers used   Attempts: 1 Procedure performed without using ultrasound guided technique. Following insertion, dressing applied. Post procedure assessment: normal and unchanged
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
## 2020-09-01 NOTE — Progress Notes (Signed)
Pharmacy Antibiotic Note  Lindsay Rivera is a 60 y.o. female  with right tentorial meningioma s/p OR.  Pharmacy has been consulted for vancomycin dosing for surgical prophylaxis -vancomycin '1000mg'$  given ~ 8am today -SCr= 0.91 on 08/28/20  Plan: -Vancomycin '750mg'$  IV q12h x2 doses to cover for 24 hours post op -Will follow if further dosing is needed after 24 hours   Height: '5\' 2"'$  (157.5 cm) Weight: 67.1 kg (147 lb 14.9 oz) IBW/kg (Calculated) : 50.1  Temp (24hrs), Avg:97.4 F (36.3 C), Min:97 F (36.1 C), Max:98 F (36.7 C)  Recent Labs  Lab 08/28/20 1155  WBC 9.9  CREATININE 0.91    Estimated Creatinine Clearance: 59.1 mL/min (by C-G formula based on SCr of 0.91 mg/dL).    Allergies  Allergen Reactions   Penicillins Shortness Of Breath and Swelling    Has patient had a PCN reaction causing immediate rash, facial/tongue/throat swelling, SOB or lightheadedness with hypotension: No Has patient had a PCN reaction causing severe rash involving mucus membranes or skin necrosis: No Has patient had a PCN reaction that required hospitalization No Has patient had a PCN reaction occurring within the last 10 years: No If all of the above answers are "NO", then may proceed with Cephalosporin use.    Iodine Nausea And Vomiting and Cough   Gadolinium Derivatives Nausea And Vomiting and Cough    Radiologist and nurse came, IV benadryl was administered, pt was observed in nurses stations for 30 min prior to leaving, no others steps taken   Latex Rash    Thank you for allowing pharmacy to be a part of this patient's care.  Hildred Laser, PharmD Clinical Pharmacist **Pharmacist phone directory can now be found on [email or web address] ([email or web address]).  Listed under [place].

## 2020-09-02 ENCOUNTER — Inpatient Hospital Stay (HOSPITAL_COMMUNITY): Payer: 59

## 2020-09-02 LAB — GLUCOSE, CAPILLARY
Glucose-Capillary: 106 mg/dL — ABNORMAL HIGH (ref 70–99)
Glucose-Capillary: 107 mg/dL — ABNORMAL HIGH (ref 70–99)
Glucose-Capillary: 121 mg/dL — ABNORMAL HIGH (ref 70–99)
Glucose-Capillary: 122 mg/dL — ABNORMAL HIGH (ref 70–99)

## 2020-09-02 LAB — BASIC METABOLIC PANEL
Anion gap: 10 (ref 5–15)
BUN: 5 mg/dL — ABNORMAL LOW (ref 6–20)
CO2: 25 mmol/L (ref 22–32)
Calcium: 9.5 mg/dL (ref 8.9–10.3)
Chloride: 106 mmol/L (ref 98–111)
Creatinine, Ser: 0.8 mg/dL (ref 0.44–1.00)
GFR, Estimated: >60 mL/min (ref >60)
Glucose, Bld: 104 mg/dL — ABNORMAL HIGH (ref 70–99)
Potassium: 3.9 mmol/L (ref 3.5–5.1)
Sodium: 141 mmol/L (ref 135–145)

## 2020-09-02 LAB — HEMOGLOBIN A1C
Hgb A1c MFr Bld: 6.2 % — ABNORMAL HIGH (ref 4.8–5.6)
Mean Plasma Glucose: 131 mg/dL

## 2020-09-02 LAB — SURGICAL PATHOLOGY

## 2020-09-02 MED ORDER — GADOBUTROL 1 MMOL/ML IV SOLN
6.5000 mL | Freq: Once | INTRAVENOUS | Status: AC | PRN
  Administered 2020-09-02: 6.5 mL via INTRAVENOUS

## 2020-09-02 NOTE — Progress Notes (Addendum)
RN was contacted by [facility] regarding patient's recently obtained MRI of brain.  Was told that there was a small acute infarct in the posterolateral R Pons.  Sent Dr. Zada Finders a message.  He responded and is aware.  No new orders given.

## 2020-09-02 NOTE — Progress Notes (Signed)
Neurosurgery Service Progress Note  Subjective: No acute events overnight, hearing subjectively stable, facial strength subjectively normal with some paresthesias in the right V3 > V2   Objective: Vitals:   09/02/20 0500 09/02/20 0600 09/02/20 0700 09/02/20 0800  BP: 101/60 104/64 95/61 106/64  Pulse: (!) 56 (!) 57 (!) 51 62  Resp: 14 15 (!) 0 17  Temp:      TempSrc:      SpO2: 94% 98% 96% 97%  Weight:      Height:        Physical Exam: AOx3, PERRL, EOMI, FS & SS, TM, Strength 5/5 x4, SILTx4, hearing decreased on right appears grossly stable  Assessment & Plan: 60 y.o. woman s/p R Kawase for tentorial meningioma, recovering well.  -MRI today to assess resection extent -transfer to floor -PT/OT -SQH POD2  Judith Part  09/02/20 8:38 AM

## 2020-09-02 NOTE — Anesthesia Postprocedure Evaluation (Signed)
Anesthesia Post Note  Patient: Lindsay Rivera  Procedure(s) Performed: Right craniotomy for tumor resection and lumbar drain placement/Brainlab (Right) PLACEMENT OF LUMBAR DRAIN APPLICATION OF CRANIAL NAVIGATION     Patient location during evaluation: PACU Anesthesia Type: General Level of consciousness: awake and alert Pain management: pain level controlled Vital Signs Assessment: post-procedure vital signs reviewed and stable Respiratory status: spontaneous breathing, nonlabored ventilation and respiratory function stable Cardiovascular status: blood pressure returned to baseline and stable Postop Assessment: no apparent nausea or vomiting Anesthetic complications: no   No notable events documented.  Last Vitals:  Vitals:   09/02/20 0700 09/02/20 0800  BP: 95/61 106/64  Pulse: (!) 51 62  Resp: (!) 0 17  Temp:    SpO2: 96% 97%                  Audry Pili

## 2020-09-02 NOTE — Evaluation (Signed)
Occupational Therapy Evaluation Patient Details Name: Lindsay Rivera MRN: [id number] DOB: 04-11-1960 Today's Date: 09/02/2020    History of Present Illness Pt is a 60 y.o. 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 Significant PMH: bipolar disorder, CHF, COPD, CVA, MI, schizophrenia, type II diabetes mellitus.   Clinical Impression   Pt PTA: pt living alone independently. Pt currently, A&Ox4, internally distracted by double vision and pain. Pt scoring 12/28 with results consistent with cognitive impairment. Pt limited by reported severe pain on R side of head, decreased cognition, decreased ability to care for self and decreased activity tolerance. Pt set-upA to maxA for ADL. Pt wearing glasses, but reports seeing double at times; able to make out time on clock with R eye closed. Pt tolerating chair position in bed to 45* pt had to stop and BP taken 93/50 with severe pain in R side of head. Session limited as pt adamantly refusing to get OOB d/t headache. Pt would benefit from continued OT skilled services. OT following acutely.    Follow Up Recommendations  CIR    Equipment Recommendations  3 in 1 bedside commode    Recommendations for Other Services Rehab consult     Precautions / Restrictions Precautions Precautions: Fall Restrictions Weight Bearing Restrictions: No      Mobility Bed Mobility Overal bed mobility: Needs Assistance             General bed mobility comments: chair position in bed to 45* pt had to stop and BP taken 93/50 with severe pain in R side of head.    Transfers Overall transfer level: Needs assistance Equipment used: None Transfers: Sit to/from Stand Sit to Stand: Mod assist         General transfer comment: Pt unable to accept significant weight through her legs or achieve upright posture, left knee buckle. Requesting to lie back down    Balance                                            ADL either performed or assessed with clinical judgement   ADL Overall ADL's : Needs assistance/impaired Eating/Feeding: Set up;Bed level   Grooming: Minimal assistance;Bed level   Upper Body Bathing: Minimal assistance;Bed level   Lower Body Bathing: Maximal assistance;Bed level   Upper Body Dressing : Minimal assistance;Bed level   Lower Body Dressing: Maximal assistance;Bed level     Toilet Transfer Details (indicate cue type and reason): DNT Toileting- Clothing Manipulation and Hygiene: Maximal assistance       Functional mobility during ADLs: Maximal assistance (unable to full participate due to pain) General ADL Comments: Pt limited by reported severe pain, decreased cognition, decreased ability to care for self and decreased activity tolerance. Pt set-upA to maxA for ADL. Pt wearing glasses, but reports seeing double at times; able to make out time on clock with R eye closed and able to see OTR's digits in air to tell how many digits present.     Vision Baseline Vision/History: Wears glasses Wears Glasses: At all times Patient Visual Report: Diplopia;Blurring of vision Vision Assessment?: Yes Eye Alignment: Within Functional Limits Ocular Range of Motion: Within Functional Limits Tracking/Visual Pursuits: Decreased smoothness of horizontal tracking Additional Comments: would benefit from additional visual testing as pt's pain reported to be severe and unable to fully test peripheral and diplopia.  Perception     Praxis      Pertinent Vitals/Pain Pain Assessment: Faces Faces Pain Scale: Hurts worst Pain Location: headache Pain Descriptors / Indicators: Headache Pain Intervention(s): Limited activity within patient's tolerance;Repositioned;Patient requesting pain meds-RN notified     Hand Dominance Right   Extremity/Trunk Assessment Upper Extremity Assessment Upper Extremity Assessment: Generalized weakness;RUE  deficits/detail;LUE deficits/detail RUE Deficits / Details: strength 4-/5 shoulder through digits; 4-/5 hand squeeze; AROM, WFLs RUE Sensation: decreased light touch RUE Coordination: decreased fine motor;decreased gross motor LUE Deficits / Details: strength 4-/5 shoulder through digits; 3+/5 hand squeeze; AROM, WFLs LUE Sensation: decreased light touch LUE Coordination: decreased fine motor;decreased gross motor   Lower Extremity Assessment Lower Extremity Assessment: LLE deficits/detail LLE Deficits / Details: Pt reporting numbness LLE Sensation: decreased light touch   Cervical / Trunk Assessment Cervical / Trunk Assessment: Normal   Communication Communication Communication: No difficulties   Cognition Arousal/Alertness: Awake/alert Behavior During Therapy: Flat affect Overall Cognitive Status: Within Functional Limits for tasks assessed                                 General Comments: A&Ox4, internally distracted by double vision and pain. Pt scoring 12/28 with results consistent with cognitive impairment. Would need formal evaluation to identify cognitive deficits.   General Comments  Healing incision    Exercises     Shoulder Instructions      Home Living Family/patient expects to be discharged to:: Private residence Living Arrangements: Alone Available Help at Discharge: Family;Available PRN/intermittently (son, but he works) Type of Home: Apartment Home Access: Level entry     Home Layout: One level     Bathroom Shower/Tub: Teacher, early years/pre: [place] - single point;Walker - 2 wheels;Walker - 4 wheels          Prior Functioning/Environment Level of Independence: Independent with assistive device(s)        Comments: using walker intermittently        OT Problem List: Decreased strength;Decreased activity tolerance;Impaired balance (sitting and/or standing);Decreased coordination;Decreased  cognition;Impaired vision/perception;Decreased safety awareness;Pain;Impaired UE functional use      OT Treatment/Interventions: Self-care/ADL training;Energy conservation;DME and/or AE instruction;Therapeutic activities;Cognitive remediation/compensation;Visual/perceptual remediation/compensation;Therapeutic exercise;Neuromuscular education;Patient/family education;Balance training    OT Goals(Current goals can be found in the care plan section) Acute Rehab OT Goals Patient Stated Goal: less headache OT Goal Formulation: With patient Time For Goal Achievement: 09/16/20 Potential to Achieve Goals: Good ADL Goals Pt Will Perform Grooming: with supervision;sitting Pt Will Transfer to Toilet: with mod assist;stand pivot transfer;bedside commode Pt/caregiver will Perform Home Exercise Program: Increased strength;Both right and left upper extremity;With Supervision;With written HEP provided Additional ADL Goal #1: Pt will increase to x10 mins of functional ADL task with <2 rest breaks to increase activity tolerance. Additional ADL Goal #2: Pt will use compensatory strategies for visual impairments with minimal cues during session to promote independence.  OT Frequency: Min 2X/week   Barriers to D/C:            Co-evaluation              AM-PAC OT "6 Clicks" Daily Activity     Outcome Measure Help from another person eating meals?: A Little Help from another person taking care of personal grooming?: A Little Help from another person toileting, which includes using toliet, bedpan, or urinal?: A Lot Help from another person bathing (including  washing, rinsing, drying)?: A Lot Help from another person to put on and taking off regular upper body clothing?: A Little Help from another person to put on and taking off regular lower body clothing?: A Lot 6 Click Score: 15   End of Session Nurse Communication: Patient requests pain meds  Activity Tolerance: Patient limited by pain Patient  left: in bed;with call bell/phone within reach;with bed alarm set;with nursing/sitter in room  OT Visit Diagnosis: Unsteadiness on feet (R26.81);Muscle weakness (generalized) (M62.81);Other symptoms and signs involving cognitive function;Pain                Time: 1510-1535 OT Time Calculation (min): 25 min Charges:  OT General Charges $OT Visit: 1 Visit OT Evaluation $OT Eval Moderate Complexity: 1 Mod OT Treatments $Cognitive Funtion inital: Initial 15 mins  Jefferey Pica, OTR/L Acute Rehabilitation Services Pager: [phone] Office: [place] 09/02/2020, 4:05 PM

## 2020-09-02 NOTE — Evaluation (Signed)
Physical Therapy Evaluation Patient Details Name: Lindsay Rivera MRN: [id number] DOB: December 15, 1960 Today's Date: 09/02/2020   History of Present Illness  Pt is a 60 y.o. 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 Significant PMH: bipolar disorder, CHF, COPD, CVA, MI, schizophrenia, type II diabetes mellitus.  Clinical Impression  PTA, pt lives alone in a level entry apartment and is independent. Pt reporting right eye diplopia and left lower extremity numbness (strength intact). Evaluation overall limited due to pt pain level and tolerance, despite premedication prior to session. Pt requiring min assist for bed mobility; unable to accept significant weight through her lower extremities upon attempt to stand with left knee buckle. Pt requesting to lie back down due to headache. Suspect good progress given age and PLOF. Recommend CIR to address deficits and maximize functional independence.     Follow Up Recommendations CIR    Equipment Recommendations  Other (comment) (TBA)    Recommendations for Other Services       Precautions / Restrictions Precautions Precautions: Fall Restrictions Weight Bearing Restrictions: No      Mobility  Bed Mobility Overal bed mobility: Needs Assistance Bed Mobility: Supine to Sit;Sit to Supine     Supine to sit: Min assist Sit to supine: Min assist   General bed mobility comments: MinA for trunk support to upright, assist for LE back into bed    Transfers Overall transfer level: Needs assistance Equipment used: None Transfers: Sit to/from Stand Sit to Stand: Mod assist         General transfer comment: Pt unable to accept significant weight through her legs or achieve upright posture, left knee buckle. Requesting to lie back down  Ambulation/Gait                Stairs            Wheelchair Mobility    Modified Rankin (Stroke Patients Only)       Balance  Overall balance assessment: Needs assistance Sitting-balance support: Feet supported Sitting balance-Leahy Scale: Good     Standing balance support: During functional activity Standing balance-Leahy Scale: Zero                               Pertinent Vitals/Pain Pain Assessment: Faces Faces Pain Scale: Hurts worst Pain Location: headache Pain Descriptors / Indicators: Headache Pain Intervention(s): Limited activity within patient's tolerance;Monitored during session;Premedicated before session    Home Living Family/patient expects to be discharged to:: Private residence Living Arrangements: Alone Available Help at Discharge: Family;Available PRN/intermittently (son, but he works) Type of Home: Apartment Home Access: Level entry     Home Layout: One [place]: [place] - single point;Walker - 2 wheels;Walker - 4 wheels      Prior Function Level of Independence: Independent with assistive device(s)         Comments: using walker intermittently     Hand Dominance        Extremity/Trunk Assessment   Upper Extremity Assessment Upper Extremity Assessment: Defer to OT evaluation    Lower Extremity Assessment Lower Extremity Assessment: LLE deficits/detail LLE Deficits / Details: Pt reporting numbness, strength WFL    Cervical / Trunk Assessment Cervical / Trunk Assessment: Normal  Communication   Communication: No difficulties  Cognition Arousal/Alertness: Awake/alert Behavior During Therapy: Flat affect Overall Cognitive Status: Within Functional Limits for tasks assessed  General Comments: A&Ox4, internally distracted by pain, will need to further assess higher level cognition      General Comments  VSS    Exercises     Assessment/Plan    PT Assessment Patient needs continued PT services  PT Problem List Decreased strength;Decreased activity tolerance;Decreased balance;Decreased  mobility;Pain;Impaired sensation       PT Treatment Interventions DME instruction;Gait training;Functional mobility training;Therapeutic activities;Therapeutic exercise;Balance training;Patient/family education    PT Goals (Current goals can be found in the Care Plan section)  Acute Rehab PT Goals Patient Stated Goal: less headache PT Goal Formulation: With patient Time For Goal Achievement: 09/16/20 Potential to Achieve Goals: Good    Frequency Min 4X/week   Barriers to discharge        Co-evaluation               AM-PAC PT "6 Clicks" Mobility  Outcome Measure Help needed turning from your back to your side while in a flat bed without using bedrails?: A Little Help needed moving from lying on your back to sitting on the side of a flat bed without using bedrails?: A Little Help needed moving to and from a bed to a chair (including a wheelchair)?: A Lot Help needed standing up from a chair using your arms (e.g., wheelchair or bedside chair)?: A Lot Help needed to walk in hospital room?: Total Help needed climbing 3-5 steps with a railing? : Total 6 Click Score: 12    End of Session   Activity Tolerance: Patient limited by pain Patient left: in bed;with call bell/phone within reach;with bed alarm set Nurse Communication: Mobility status;Other (comment) (LLE numbness) PT Visit Diagnosis: Pain;Difficulty in walking, not elsewhere classified (R26.2) Pain - part of body:  (head)    Time: [id number] PT Time Calculation (min) (ACUTE ONLY): 18 min   Charges:   PT Evaluation $PT Eval Moderate Complexity: 1 Mod          Wyona Almas, PT, DPT Acute Rehabilitation Services Pager [phone] Office [phone]   Deno Etienne 09/02/2020, 11:17 AM

## 2020-09-02 NOTE — Progress Notes (Signed)
Inpatient Rehab Admissions Coordinator Note:   Per therapy recommendations, pt was screened for CIR candidacy by Clemens Catholic, [place] CCC-SLP. At this time, Pt. Appears to have functional decline and is a potential  candidate for CIR. Will place order for rehab consult per protocol. Note that Pt. Lives alone, will need to work out 24/7 support for [facility] admission. Please contact me with questions.   Clemens Catholic, [place], [place] Admissions Coordinator  [phone] ([place]) [phone] (office)

## 2020-09-03 ENCOUNTER — Encounter (HOSPITAL_COMMUNITY): Payer: Self-pay | Admitting: Neurological Surgery

## 2020-09-03 LAB — GLUCOSE, CAPILLARY
Glucose-Capillary: 122 mg/dL — ABNORMAL HIGH (ref 70–99)
Glucose-Capillary: 114 mg/dL — ABNORMAL HIGH (ref 70–99)
Glucose-Capillary: 132 mg/dL — ABNORMAL HIGH (ref 70–99)
Glucose-Capillary: 122 mg/dL — ABNORMAL HIGH (ref 70–99)
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
## 2020-09-03 NOTE — Progress Notes (Signed)
Patient states she can place herself on CPAP when ready. RT will continue to monitor.  

## 2020-09-03 NOTE — Progress Notes (Signed)
Neurosurgery Service Progress Note  Subjective: No acute events overnight, no new complaints, continued focal incisional pain but no other headaches  Objective: Vitals:   09/02/20 1600 09/02/20 1758 09/03/20 0010 09/03/20 0347  BP: (!) 93/55 102/65 (!) 98/56 101/60  Pulse: (!) 54 62 (!) 59 61  Resp: '13 20 19 18  '$ Temp: 98.7 F (37.1 C)  98.7 F (37.1 C) 98 F (36.7 C)  TempSrc: Oral  Oral Axillary  SpO2: 96% 96% 97% 99%  Weight:      Height:        Physical Exam: AOx3, PERRL, EOMI, FS & SS, TM, Strength 5/5 x4, SILTx4, hearing decreased on right appears grossly stable  Assessment & Plan: 60 y.o. woman s/p R Kawase for tentorial meningioma, recovering well. Post-op MRI with gross total resection of tumor, some possible small areas of diffusion change in the dorsal pons that are asymptomatic.  -PT/OT rec CIR, PM&R eval pending -SQH today  Judith Part  09/03/20 7:22 AM

## 2020-09-03 NOTE — Progress Notes (Signed)
Physical Therapy Treatment Patient Details Name: Lindsay Rivera MRN: [id number] DOB: [birth year] Today's Date: 09/03/2020    History of Present Illness Pt is a 60 y.o. 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 Significant PMH: bipolar disorder, CHF, COPD, CVA, MI, schizophrenia, type II diabetes mellitus.    PT Comments    Patient received in bed, asking for bath and for bed to be changed. She reports she is not feeling well. Continues to have right side head/ear pain. Continues to report left LE numbness. She is mod independent with bed mobility, transfers with +1-2 min assist. Left LE with mild buckling with weight bearing and stepping. She is able to get from bed to recliner with min +2 hand held assist. Declines further mobility due to pain. Patient will continue to benefit from skilled PT while here to improve functional independence, safety and strength.     Follow Up Recommendations  CIR     Equipment Recommendations  Other (comment) (TBD)    Recommendations for Other Services       Precautions / Restrictions Precautions Precautions: Fall Restrictions Weight Bearing Restrictions: No    Mobility  Bed Mobility Overal bed mobility: Modified Independent Bed Mobility: Supine to Sit     Supine to sit: Modified independent (Device/Increase time)          Transfers Overall transfer level: Needs assistance Equipment used: 2 person hand held assist Transfers: Sit to/from Stand Sit to Stand: Min assist         General transfer comment: Patient able to stand with min +2 assist.  Ambulation/Gait Ambulation/Gait assistance: Min assist;+2 physical assistance Gait Distance (Feet): 4 Feet Assistive device: 2 person hand held assist Gait Pattern/deviations: Step-to pattern;Decreased step length - right;Decreased step length - left Gait velocity: decr   General Gait Details: patient able to take a few  steps from bed to recliner. She declined ambulating further due to not feeling well.  Continues to have L LE numbness and mild buckling with weight bearing.   Stairs             Wheelchair Mobility    Modified Rankin (Stroke Patients Only)       Balance Overall balance assessment: Needs assistance Sitting-balance support: Feet supported Sitting balance-Leahy Scale: Good     Standing balance support: Bilateral upper extremity supported;During functional activity Standing balance-Leahy Scale: Fair Standing balance comment: requires assistance due to continued L leg weakness and numbness                            Cognition Arousal/Alertness: Awake/alert Behavior During Therapy: Flat affect Overall Cognitive Status: Within Functional Limits for tasks assessed                                        Exercises      General Comments        Pertinent Vitals/Pain Pain Assessment: Faces Faces Pain Scale: Hurts whole lot Pain Location: side of head/ear Pain Descriptors / Indicators: Aching;Discomfort Pain Intervention(s): Monitored during session    Home Living                      Prior Function            PT Goals (current goals can now be found in the care  plan section) Acute Rehab PT Goals Patient Stated Goal: decrease pain PT Goal Formulation: With patient Time For Goal Achievement: 09/16/20 Potential to Achieve Goals: Good Progress towards PT goals: Progressing toward goals    Frequency    Min 4X/week      PT Plan Current plan remains appropriate    Co-evaluation              AM-PAC PT "6 Clicks" Mobility   Outcome Measure  Help needed turning from your back to your side while in a flat bed without using bedrails?: A Little Help needed moving from lying on your back to sitting on the side of a flat bed without using bedrails?: A Little Help needed moving to and from a bed to a chair (including a  wheelchair)?: A Little Help needed standing up from a chair using your arms (e.g., wheelchair or bedside chair)?: A Little Help needed to walk in hospital room?: A Lot Help needed climbing 3-5 steps with a railing? : A Lot 6 Click Score: 16    End of Session   Activity Tolerance: Patient limited by pain Patient left: in chair;with call bell/phone within reach;with chair alarm set Nurse Communication: Mobility status PT Visit Diagnosis: Pain;Difficulty in walking, not elsewhere classified (R26.2);Muscle weakness (generalized) (M62.81) Pain - Right/Left: Right Pain - part of body:  (head/ear)     [id number] PT Time Calculation (min) (ACUTE ONLY): 15 min  Charges:  $Therapeutic Activity: 8-22 mins                    Meagan Ancona, PT, GCS 09/03/20,3:55 PM

## 2020-09-03 NOTE — Progress Notes (Signed)
  Inpatient Rehabilitation Admissions Coordinator   Inpatient rehab consult received. I met with patient at bedside for assessment and then contacted her son [name] by phone with her permission. Prior to admit patient independent and living alone. He plans for her to come to his home at d/c where he and his girlfriend can provide 24/7 assist. He prefers Cir admit rather than SNF. I will begin Auth with [facility] today after I have received updated treatment notes from PT. I will then follow up pending insurance authorization.  Danne Baxter, RN, MSN Rehab Admissions Coordinator [phone] 09/03/2020 11:23 AM

## 2020-09-04 LAB — TYPE AND SCREEN
ABO/RH(D): A POS
Antibody Screen: NEGATIVE
Unit division: 0
Unit division: 0

## 2020-09-04 LAB — BPAM RBC
Blood Product Expiration Date: 202208212359
Blood Product Expiration Date: 202208232359
Unit Type and Rh: 6200
Unit Type and Rh: 6200

## 2020-09-04 LAB — GLUCOSE, CAPILLARY
Glucose-Capillary: 152 mg/dL — ABNORMAL HIGH (ref 70–99)
Glucose-Capillary: 167 mg/dL — ABNORMAL HIGH (ref 70–99)
Glucose-Capillary: 119 mg/dL — ABNORMAL HIGH (ref 70–99)
Glucose-Capillary: 129 mg/dL — ABNORMAL HIGH (ref 70–99)

## 2020-09-04 NOTE — Progress Notes (Signed)
Physical Therapy Treatment Patient Details Name: Lindsay Rivera MRN: [id number] DOB: [birth year] Today's Date: 09/04/2020    History of Present Illness Pt is a 60 y.o. 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 Significant PMH: bipolar disorder, CHF, COPD, CVA, MI, schizophrenia, type II diabetes mellitus.    PT Comments    Patient seen in conjunction with OT to maximize activity tolerance. Encouragement required for participation with therapy due to increased pain. Patient requires minA+2 for OOB mobility with HHAx2, cues required for utilizing vision to assist with placement of L LE. Patient continues to have L LE numbness. Patient has difficulty multi-tasking requiring assistance for maintaining midline posture standing at sink with single UE support. Continue to recommend comprehensive inpatient rehab (CIR) for post-acute therapy needs.    Follow Up Recommendations  CIR     Equipment Recommendations  Other (comment) (TBD)    Recommendations for Other Services       Precautions / Restrictions Precautions Precautions: Fall Restrictions Weight Bearing Restrictions: No    Mobility  Bed Mobility Overal bed mobility: Modified Independent                  Transfers Overall transfer level: Needs assistance Equipment used: 2 person hand held assist Transfers: Sit to/from Stand Sit to Stand: Min assist;+2 physical assistance;+2 safety/equipment         General transfer comment: heavy minA+2 for initial sit to stand from EOB but progressed with subsequent stand. Cues for foot placement prior to stand due to patient kicking L LE out in front to prevent WBing  Ambulation/Gait Ambulation/Gait assistance: Min assist;+2 physical assistance;+2 safety/equipment Gait Distance (Feet): 5 Feet (x5') Assistive device: 2 person hand held assist Gait Pattern/deviations: Step-to pattern;Decreased step length -  left;Decreased stride length;Decreased dorsiflexion - left;Decreased weight shift to left;Ataxic Gait velocity: decreased   General Gait Details: Difficulty lifting L LE off ground to advance. Slides L foot on floor. Cues for picking L foot off ground and utilizing vision to assist with placement. Intermittent follow through. L knee instability noted throughout with mild knee buckling noted initially.   Stairs             Wheelchair Mobility    Modified Rankin (Stroke Patients Only)       Balance Overall balance assessment: Needs assistance Sitting-balance support: Feet supported Sitting balance-Leahy Scale: Good     Standing balance support: Bilateral upper extremity supported;During functional activity Standing balance-Leahy Scale: Poor Standing balance comment: reliant on UE support and external assist. Able to stand at sink with single UE support and minA+2 for safety and balance due to R lateral lean with fatigue                            Cognition Arousal/Alertness: Awake/alert Behavior During Therapy: Flat affect Overall Cognitive Status: Impaired/Different from baseline Area of Impairment: Attention;Following commands;Safety/judgement;Problem solving                   Current Attention Level: Sustained   Following Commands: Follows one step commands inconsistently;Follows one step commands with increased time Safety/Judgement: Decreased awareness of safety   Problem Solving: Slow processing;Requires verbal cues;Requires tactile cues General Comments: Required increased time to follow instructions. Sustained attention with difficulty attending to two tasks at once      Exercises      General Comments        Pertinent Vitals/Pain  Pain Assessment: Faces Faces Pain Scale: Hurts even more Pain Location: side of head/ear Pain Descriptors / Indicators: Aching;Discomfort Pain Intervention(s): Monitored during session;Repositioned     Home Living                      Prior Function            PT Goals (current goals can now be found in the care plan section) Acute Rehab PT Goals Patient Stated Goal: decrease pain PT Goal Formulation: With patient Time For Goal Achievement: 09/16/20 Potential to Achieve Goals: Good Progress towards PT goals: Progressing toward goals    Frequency    Min 4X/week      PT Plan Current plan remains appropriate    Co-evaluation PT/OT/SLP Co-Evaluation/Treatment: Yes Reason for Co-Treatment: For patient/therapist safety;To address functional/ADL transfers PT goals addressed during session: Mobility/safety with mobility;Balance        AM-PAC PT "6 Clicks" Mobility   Outcome Measure  Help needed turning from your back to your side while in a flat bed without using bedrails?: None Help needed moving from lying on your back to sitting on the side of a flat bed without using bedrails?: None Help needed moving to and from a bed to a chair (including a wheelchair)?: Total Help needed standing up from a chair using your arms (e.g., wheelchair or bedside chair)?: Total Help needed to walk in hospital room?: Total Help needed climbing 3-5 steps with a railing? : Total 6 Click Score: 12    End of Session Equipment Utilized During Treatment: Gait belt Activity Tolerance: Patient tolerated treatment well Patient left: in bed;with call bell/phone within reach;with bed alarm set;with family/visitor present Nurse Communication: Mobility status PT Visit Diagnosis: Pain;Difficulty in walking, not elsewhere classified (R26.2);Muscle weakness (generalized) (M62.81)     Time: [id number] PT Time Calculation (min) (ACUTE ONLY): 29 min  Charges:  $Gait Training: 8-22 mins                     Lindsay Rivera A. Lindsay Rivera PT, DPT Acute Rehabilitation Services Pager [phone] Office [phone]    Lindsay Rivera 09/04/2020, 2:35 PM

## 2020-09-04 NOTE — Progress Notes (Signed)
Occupational Therapy Treatment Patient Details Name: Lindsay Rivera MRN: [id number] DOB: 12-31-1960 Today's Date: 09/04/2020    History of present illness 60 y.o. 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 Significant PMH: bipolar disorder, CHF, COPD, CVA, MI, schizophrenia, type II diabetes mellitus.   OT comments  Pt progressing towards established OT goals. Pt seen in collaboration with PT to address functional ADLs and activity tolerance. Pt benefiting from encouragement from therapists and nieces at beginning of session due to pain; agreeable to OOB mobility after encouragement. Pt reporting double vision and providing occlusion tape on glasses to R eye with pt reporting no double vision after occlusion. Performing functional mobility with Min A hand held assist +2. Pt performing oral care with Min A for balance and weight bearing through BLE. Pt presenting with decreased activity tolerance, strength, safety, awareness, and problem solving. Due to pt support and significant change in functional status, recommend discharge to CIR for intensive OT services and will continue to follow acutely as admitted.      Follow Up Recommendations  CIR    Equipment Recommendations  3 in 1 bedside commode    Recommendations for Other Services Rehab consult    Precautions / Restrictions Precautions Precautions: Fall Restrictions Weight Bearing Restrictions: No       Mobility Bed Mobility Overal bed mobility: Modified Independent             General bed mobility comments: Pt coming to EOB and returning to bed with mod I    Transfers Overall transfer level: Needs assistance Equipment used: 2 person hand held assist Transfers: Sit to/from Stand Sit to Stand: Min assist;+2 physical assistance;+2 safety/equipment         General transfer comment: heavy minA+2 for initial sit to stand from EOB but progressed with  subsequent stand. Cues for foot placement prior to stand due to patient kicking L LE out in front to prevent WBing    Balance Overall balance assessment: Needs assistance Sitting-balance support: Feet supported Sitting balance-Leahy Scale: Good Sitting balance - Comments: long sit in bed with no back support and sitting EOB with supervision-min Guard A   Standing balance support: Bilateral upper extremity supported;During functional activity Standing balance-Leahy Scale: Poor Standing balance comment: reliant on UE support and external assist. Able to stand at sink with single UE support and minA+2 for safety and balance due to R lateral lean with fatigue                           ADL either performed or assessed with clinical judgement   ADL Overall ADL's : Needs assistance/impaired     Grooming: Minimal assistance;Oral care;Standing Grooming Details (indicate cue type and reason): Min A for standing balance. Requiring multimodal cues to weightbear through BLE. Facilitating at R hip to weight shift to L for weight bearing through BLE during oral care. Knee blocking of RLE as pt attempting to bear all weight through RLE and knee buckling present                 Toilet Transfer: Moderate assistance;+2 for physical assistance;+2 for safety/equipment;Ambulation Toilet Transfer Details (indicate cue type and reason): simulated toilet transfer up from EOB with mod A +2 for initial power up         Functional mobility during ADLs: Minimal assistance;+2 for physical assistance;+2 for safety/equipment;Cueing for sequencing;Cueing for safety General ADL Comments: Min A +2  hand held assist for functional mobility. Requiring verbal cues for advancement of LLE. Additionally requiring verbal cues for safety. Performing oral care with min A for balance and facilitation of weight bearing through BLE.     Vision   Vision Assessment?: Yes Diplopia Assessment: Disappears with one  eye closed;Present all the time/all directions (reporting double vision with L eye closed and with both eyes open. Pt reporting no double vision with R eye closed.) Additional Comments: Reporting double vision with L eye closed and with both eyes open. Pt reporting no double vision with R eye closed. Taping pt glasses to fully occlue L eye and pt reporting no double vision. Due to report of double vision with L eye closed, unsure of accuracy of pt report. Handout provided on occlusion glasses.   Perception     Praxis      Cognition Arousal/Alertness: Awake/alert Behavior During Therapy: Flat affect Overall Cognitive Status: Impaired/Different from baseline Area of Impairment: Attention;Following commands;Safety/judgement;Problem solving;Awareness                   Current Attention Level: Sustained   Following Commands: Follows one step commands inconsistently;Follows one step commands with increased time Safety/Judgement: Decreased awareness of safety Awareness: Emergent Problem Solving: Slow processing;Requires verbal cues;Requires tactile cues General Comments: Required increased time to follow instructions. Sustained attention with difficulty attending to two tasks at once. Pt requiring encouragement for OOB moblity. Pt requiring verbal cues for safety and problem solving with functional mobility. Demonstrating good problem solving with oral care but decreased attention to multimodal cues to pause and  weightbear through BLE while performing grooming at sink.        Exercises     Shoulder Instructions       General Comments Pt 2 nieces present. Pt and nieces educated regarding occlusion glasses and role of light in recovery of headache and vision recovery.    Pertinent Vitals/ Pain       Pain Assessment: Faces Faces Pain Scale: Hurts even more Pain Location: side of head/ear Pain Descriptors / Indicators: Aching;Discomfort Pain Intervention(s): Monitored during  session;Repositioned  Home Living                                          Prior Functioning/Environment              Frequency  Min 2X/week        Progress Toward Goals  OT Goals(current goals can now be found in the care plan section)     Acute Rehab OT Goals Patient Stated Goal: decrease pain OT Goal Formulation: With patient Time For Goal Achievement: 09/16/20 Potential to Achieve Goals: Good ADL Goals Pt Will Perform Grooming: with supervision;sitting Pt Will Transfer to Toilet: with mod assist;stand pivot transfer;bedside commode Pt/caregiver will Perform Home Exercise Program: Increased strength;Both right and left upper extremity;With Supervision;With written HEP provided Additional ADL Goal #1: Pt will increase to x10 mins of functional ADL task with <2 rest breaks to increase activity tolerance. Additional ADL Goal #2: Pt will use compensatory strategies for visual impairments with minimal cues during session to promote independence.  Plan Discharge plan remains appropriate    Co-evaluation    PT/OT/SLP Co-Evaluation/Treatment: Yes Reason for Co-Treatment: For patient/therapist safety;To address functional/ADL transfers PT goals addressed during session: Mobility/safety with mobility;Balance OT goals addressed during session: ADL's and self-care  AM-PAC OT "6 Clicks" Daily Activity     Outcome Measure   Help from another person eating meals?: A Little Help from another person taking care of personal grooming?: A Little Help from another person toileting, which includes using toliet, bedpan, or urinal?: A Lot Help from another person bathing (including washing, rinsing, drying)?: A Little Help from another person to put on and taking off regular upper body clothing?: A Little Help from another person to put on and taking off regular lower body clothing?: A Lot 6 Click Score: 16    End of Session Equipment Utilized During  Treatment: Gait belt  OT Visit Diagnosis: Unsteadiness on feet (R26.81);Muscle weakness (generalized) (M62.81);Other symptoms and signs involving cognitive function;Pain Pain - Right/Left: Right Pain - part of body:  (ear/R side of head)   Activity Tolerance Patient tolerated treatment well   Patient Left in bed;with call bell/phone within reach;with bed alarm set;with family/visitor present   Nurse Communication Mobility status        Time: 1349-1419 OT Time Calculation (min): 30 min  Charges: OT General Charges $OT Visit: 1 Visit OT Treatments $Self Care/Home Management : 8-22 mins  Shanda Howells, OTDS    Shanda Howells 09/04/2020, 3:39 PM

## 2020-09-04 NOTE — Progress Notes (Signed)
   Providing Compassionate, Quality Care - Together  NEUROSURGERY PROGRESS NOTE   S: No issues overnight.  Complains of periincisional headache  O: EXAM:  BP (!) 130/92 (BP Location: Right Arm)   Pulse 65   Temp 98.6 F (37 C)   Resp 18   Ht '5\' 2"'$  (1.575 m)   Wt 67.1 kg   SpO2 100%   BMI 27.06 kg/m   Awake, alert, oriented x3 PERRL EOMI Speech fluent, appropriate  CNs grossly intact  5/5 BUE/BLE  Wound clean dry and intact Subjective decreased hearing on right  ASSESSMENT:  61 y.o. female with   Tentorial meningioma  Status post resection  PLAN: -Rehab pending -Pain control -Supportive care -Overall doing very well.    Thank you for allowing me to participate in this patient's care.  Please do not hesitate to call with questions or concerns.   Elwin Sleight, [facility] Cell: [phone]

## 2020-09-04 NOTE — Progress Notes (Signed)
Pt's BP low d/t pain meds therefore Dr. Marcello Moores on-call Neurosurgery informed and agrees to hold HS carvedilol this shift.

## 2020-09-04 NOTE — Care Management Important Message (Signed)
Important Message  Patient Details  Name: Lindsay Rivera MRN: [id number] Date of Birth: 25-Jun-1960   Medicare Important Message Given:  Yes     Memory Argue 09/04/2020, 5:08 PM

## 2020-09-05 LAB — GLUCOSE, CAPILLARY
Glucose-Capillary: 139 mg/dL — ABNORMAL HIGH (ref 70–99)
Glucose-Capillary: 152 mg/dL — ABNORMAL HIGH (ref 70–99)
Glucose-Capillary: 118 mg/dL — ABNORMAL HIGH (ref 70–99)
Glucose-Capillary: 141 mg/dL — ABNORMAL HIGH (ref 70–99)

## 2020-09-05 MED ORDER — HYDROCODONE-ACETAMINOPHEN 10-325 MG PO TABS
1.0000 | ORAL_TABLET | ORAL | Status: DC | PRN
  Administered 2020-09-05: 2 via ORAL
  Administered 2020-09-05: 1 via ORAL
  Administered 2020-09-06: 2 via ORAL
  Administered 2020-09-06: 1 via ORAL
  Administered 2020-09-06: 2 via ORAL
  Administered 2020-09-06: 1 via ORAL
  Administered 2020-09-07 (×3): 2 via ORAL
  Filled 2020-09-05: qty 1
  Filled 2020-09-05 (×3): qty 2
  Filled 2020-09-05: qty 1
  Filled 2020-09-05: qty 2
  Filled 2020-09-05: qty 1
  Filled 2020-09-05: qty 2
  Filled 2020-09-05: qty 1
  Filled 2020-09-05: qty 2

## 2020-09-05 NOTE — Progress Notes (Signed)
   Providing Compassionate, Quality Care - Together  NEUROSURGERY PROGRESS NOTE   S: No issues overnight.  Complains of right incisional pain and right ear pain  O: EXAM:  BP 108/77 (BP Location: Right Arm)   Pulse 74   Temp 97.8 F (36.6 C) (Oral)   Resp 18   Ht '5\' 2"'$  (1.575 m)   Wt 67.1 kg   SpO2 97%   BMI 27.06 kg/m    Awake, alert, oriented x3 PERRL EOMI Speech fluent, appropriate CNs grossly intact 5/5 BUE/BLE  Wound clean dry and intact Subjective decreased hearing on right   ASSESSMENT:  60 y.o. female with   Tentorial meningioma   Status post resection   PLAN: -Rehab pending -Pain control, increased her pain medication this morning -Supportive care     Thank you for allowing me to participate in this patient's care.  Please do not hesitate to call with questions or concerns.   Elwin Sleight, [facility] Cell: [phone]

## 2020-09-05 NOTE — Progress Notes (Signed)
Pt's pain management seems to be insufficient to meet pt's needs. The pt appear to need her current pain management quite frequently. This RN has observed and assessed pt's need for pain meds throughout this shift, which warrants the suggestions of the on-coming RN to reach out to the MD for an/if any adjustments to pt's pain management.

## 2020-09-05 NOTE — Progress Notes (Signed)
Pt began to scream while NT+3 was taking vitals. She complains of severe numbness in the right side of her mouth. This is a new development today. Numbness was in right ear, arm and some of the leg. RN and NT+3 calmed the patient. A continuous pulse ox was obtained to assess O2 levels. O2 remained at 100%. Airway was assesses and presented as patent. MD was callend and made aware of situation. MD stated that this is expected after the procedure she underwent.  This RN followed the neuro note to call the number listed in the note for any reason and was asked not to call again. This RN waited for a call for over an hour for the same patient earlier in the day. Patient is calm and food and drink have been removed for the time being. Will continue to monitor.

## 2020-09-05 NOTE — Plan of Care (Signed)
  Problem: Education: Goal: Knowledge of General Education information will improve Description: Including pain rating scale, medication(s)/side effects and non-pharmacologic comfort measures Outcome: Progressing   Problem: Health Behavior/Discharge Planning: Goal: Ability to manage health-related needs will improve Outcome: Progressing   Problem: Clinical Measurements: Goal: Ability to maintain clinical measurements within normal limits will improve Outcome: Progressing Goal: Will remain free from infection Outcome: Progressing Goal: Diagnostic test results will improve Outcome: Progressing   Problem: Coping: Goal: Level of anxiety will decrease Outcome: Progressing   Problem: Pain Managment: Goal: General experience of comfort will improve Outcome: Progressing

## 2020-09-05 NOTE — Plan of Care (Signed)

## 2020-09-06 LAB — GLUCOSE, CAPILLARY
Glucose-Capillary: 211 mg/dL — ABNORMAL HIGH (ref 70–99)
Glucose-Capillary: 154 mg/dL — ABNORMAL HIGH (ref 70–99)
Glucose-Capillary: 145 mg/dL — ABNORMAL HIGH (ref 70–99)
Glucose-Capillary: 173 mg/dL — ABNORMAL HIGH (ref 70–99)

## 2020-09-06 MED ORDER — HYDROCORTISONE ACETATE 25 MG RE SUPP
25.0000 mg | Freq: Two times a day (BID) | RECTAL | Status: DC
  Administered 2020-09-06 – 2020-09-07 (×2): 25 mg via RECTAL
  Filled 2020-09-06 (×3): qty 1

## 2020-09-06 MED ORDER — DEXAMETHASONE 4 MG PO TABS
4.0000 mg | ORAL_TABLET | Freq: Three times a day (TID) | ORAL | Status: DC
  Administered 2020-09-06 – 2020-09-07 (×4): 4 mg via ORAL
  Filled 2020-09-06 (×4): qty 1

## 2020-09-06 MED ORDER — HYDROCORTISONE ACETATE 25 MG RE SUPP
25.0000 mg | Freq: Two times a day (BID) | RECTAL | Status: DC
  Filled 2020-09-06: qty 1

## 2020-09-06 NOTE — Progress Notes (Signed)
Pt states she will place herself on CPAP for tonight when she is ready. RT explained to pt to please call if she needed any assistance.

## 2020-09-06 NOTE — Progress Notes (Signed)
Subjective: Patient reports right posterior tongue numbness, some right lip numbness.  She still has numbness in her left leg and her trunk  Objective: Vital signs in last 24 hours: Temp:  [97.8 F (36.6 C)-98.4 F (36.9 C)] 98.1 F (36.7 C) (07/31 0807) Pulse Rate:  [66-83] 66 (07/31 0807) Resp:  [14-20] 17 (07/31 0807) BP: (90-152)/(61-139) 120/73 (07/31 0807) SpO2:  [95 %-100 %] 98 % (07/31 0807)  Intake/Output from previous day: No intake/output data recorded. Intake/Output this shift: No intake/output data recorded.  NAD A+Ox3 EOMI Decreased sensation right periorbital area Decreased right hearing No pronator drift Incision c/d  Lab Results: No results for input(s): WBC, HGB, HCT, PLT in the last 72 hours. BMET No results for input(s): NA, K, CL, CO2, GLUCOSE, BUN, CREATININE, CALCIUM in the last 72 hours.  Studies/Results: No results found.  Assessment/Plan: S/p Kawase approach for resection of meningioma - continue supportive care - I discussed with patient that her neurologic findings can be considered part of the expected recovery given the location of the tumor and postop imaging findings. - will start dexamethasone to help with her symptoms - cont hep subQ  Vallarie Mare 09/06/2020, 11:05 AM

## 2020-09-06 NOTE — Plan of Care (Signed)
  Problem: Education: Goal: Knowledge of General Education information will improve Description: Including pain rating scale, medication(s)/side effects and non-pharmacologic comfort measures Outcome: Progressing   Problem: Health Behavior/Discharge Planning: Goal: Ability to manage health-related needs will improve Outcome: Progressing   Problem: Clinical Measurements: Goal: Ability to maintain clinical measurements within normal limits will improve Outcome: Progressing Goal: Will remain free from infection Outcome: Progressing   Problem: Coping: Goal: Level of anxiety will decrease Outcome: Progressing   Problem: Pain Managment: Goal: General experience of comfort will improve Outcome: Progressing

## 2020-09-06 NOTE — Plan of Care (Signed)
  Problem: Activity: Goal: Risk for activity intolerance will decrease Outcome: Progressing   Problem: Pain Managment: Goal: General experience of comfort will improve Outcome: Progressing   Problem: Safety: Goal: Ability to remain free from injury will improve Outcome: Progressing   

## 2020-09-06 NOTE — Plan of Care (Signed)

## 2020-09-07 ENCOUNTER — Encounter (HOSPITAL_COMMUNITY): Payer: Self-pay | Admitting: Physical Medicine & Rehabilitation

## 2020-09-07 ENCOUNTER — Inpatient Hospital Stay (HOSPITAL_COMMUNITY)
Admission: RE | Admit: 2020-09-07 | Discharge: 2020-09-24 | DRG: 091 | Disposition: A | Discharge status: Home Health | Payer: 59 | Source: Intra-hospital | Attending: Physical Medicine & Rehabilitation | Admitting: Physical Medicine & Rehabilitation

## 2020-09-07 ENCOUNTER — Other Ambulatory Visit: Payer: Self-pay

## 2020-09-07 DIAGNOSIS — I10 Essential (primary) hypertension: Secondary | ICD-10-CM

## 2020-09-07 DIAGNOSIS — R45851 Suicidal ideations: Secondary | ICD-10-CM | POA: Diagnosis not present

## 2020-09-07 DIAGNOSIS — B009 Herpesviral infection, unspecified: Secondary | ICD-10-CM | POA: Diagnosis present

## 2020-09-07 DIAGNOSIS — Z87891 Personal history of nicotine dependence: Secondary | ICD-10-CM

## 2020-09-07 DIAGNOSIS — G936 Cerebral edema: Secondary | ICD-10-CM | POA: Diagnosis present

## 2020-09-07 DIAGNOSIS — E119 Type 2 diabetes mellitus without complications: Secondary | ICD-10-CM | POA: Diagnosis present

## 2020-09-07 DIAGNOSIS — E118 Type 2 diabetes mellitus with unspecified complications: Secondary | ICD-10-CM | POA: Diagnosis not present

## 2020-09-07 DIAGNOSIS — R4189 Other symptoms and signs involving cognitive functions and awareness: Secondary | ICD-10-CM | POA: Diagnosis present

## 2020-09-07 DIAGNOSIS — R001 Bradycardia, unspecified: Secondary | ICD-10-CM | POA: Diagnosis not present

## 2020-09-07 DIAGNOSIS — K5900 Constipation, unspecified: Secondary | ICD-10-CM | POA: Diagnosis present

## 2020-09-07 DIAGNOSIS — R200 Anesthesia of skin: Secondary | ICD-10-CM | POA: Diagnosis present

## 2020-09-07 DIAGNOSIS — Z9889 Other specified postprocedural states: Secondary | ICD-10-CM

## 2020-09-07 DIAGNOSIS — I700 Atherosclerosis of aorta: Secondary | ICD-10-CM | POA: Diagnosis present

## 2020-09-07 DIAGNOSIS — G4733 Obstructive sleep apnea (adult) (pediatric): Secondary | ICD-10-CM | POA: Diagnosis not present

## 2020-09-07 DIAGNOSIS — H532 Diplopia: Secondary | ICD-10-CM | POA: Diagnosis present

## 2020-09-07 DIAGNOSIS — D72828 Other elevated white blood cell count: Secondary | ICD-10-CM | POA: Diagnosis not present

## 2020-09-07 DIAGNOSIS — E039 Hypothyroidism, unspecified: Secondary | ICD-10-CM | POA: Diagnosis present

## 2020-09-07 DIAGNOSIS — Z8673 Personal history of transient ischemic attack (TIA), and cerebral infarction without residual deficits: Secondary | ICD-10-CM

## 2020-09-07 DIAGNOSIS — Z90710 Acquired absence of both cervix and uterus: Secondary | ICD-10-CM

## 2020-09-07 DIAGNOSIS — R451 Restlessness and agitation: Secondary | ICD-10-CM | POA: Diagnosis not present

## 2020-09-07 DIAGNOSIS — K5901 Slow transit constipation: Secondary | ICD-10-CM

## 2020-09-07 DIAGNOSIS — J449 Chronic obstructive pulmonary disease, unspecified: Secondary | ICD-10-CM | POA: Diagnosis present

## 2020-09-07 DIAGNOSIS — F209 Schizophrenia, unspecified: Secondary | ICD-10-CM | POA: Diagnosis present

## 2020-09-07 DIAGNOSIS — R4587 Impulsiveness: Secondary | ICD-10-CM | POA: Diagnosis present

## 2020-09-07 DIAGNOSIS — B029 Zoster without complications: Secondary | ICD-10-CM | POA: Diagnosis not present

## 2020-09-07 DIAGNOSIS — G8929 Other chronic pain: Secondary | ICD-10-CM | POA: Diagnosis present

## 2020-09-07 DIAGNOSIS — D72829 Elevated white blood cell count, unspecified: Secondary | ICD-10-CM | POA: Diagnosis not present

## 2020-09-07 DIAGNOSIS — I252 Old myocardial infarction: Secondary | ICD-10-CM

## 2020-09-07 DIAGNOSIS — Z79899 Other long term (current) drug therapy: Secondary | ICD-10-CM

## 2020-09-07 DIAGNOSIS — F3162 Bipolar disorder, current episode mixed, moderate: Secondary | ICD-10-CM | POA: Diagnosis present

## 2020-09-07 DIAGNOSIS — Z833 Family history of diabetes mellitus: Secondary | ICD-10-CM

## 2020-09-07 DIAGNOSIS — Z79890 Hormone replacement therapy (postmenopausal): Secondary | ICD-10-CM

## 2020-09-07 DIAGNOSIS — F411 Generalized anxiety disorder: Secondary | ICD-10-CM | POA: Diagnosis present

## 2020-09-07 DIAGNOSIS — Z880 Allergy status to penicillin: Secondary | ICD-10-CM

## 2020-09-07 DIAGNOSIS — Z91040 Latex allergy status: Secondary | ICD-10-CM

## 2020-09-07 DIAGNOSIS — F410 Panic disorder [episodic paroxysmal anxiety] without agoraphobia: Secondary | ICD-10-CM | POA: Diagnosis present

## 2020-09-07 DIAGNOSIS — D329 Benign neoplasm of meninges, unspecified: Secondary | ICD-10-CM | POA: Diagnosis not present

## 2020-09-07 DIAGNOSIS — E669 Obesity, unspecified: Secondary | ICD-10-CM

## 2020-09-07 DIAGNOSIS — E785 Hyperlipidemia, unspecified: Secondary | ICD-10-CM | POA: Diagnosis present

## 2020-09-07 DIAGNOSIS — Z7984 Long term (current) use of oral hypoglycemic drugs: Secondary | ICD-10-CM

## 2020-09-07 DIAGNOSIS — R2689 Other abnormalities of gait and mobility: Principal | ICD-10-CM | POA: Diagnosis present

## 2020-09-07 DIAGNOSIS — Z951 Presence of aortocoronary bypass graft: Secondary | ICD-10-CM

## 2020-09-07 DIAGNOSIS — I2510 Atherosclerotic heart disease of native coronary artery without angina pectoris: Secondary | ICD-10-CM | POA: Diagnosis present

## 2020-09-07 DIAGNOSIS — E089 Diabetes mellitus due to underlying condition without complications: Secondary | ICD-10-CM

## 2020-09-07 DIAGNOSIS — T380X5A Adverse effect of glucocorticoids and synthetic analogues, initial encounter: Secondary | ICD-10-CM | POA: Diagnosis not present

## 2020-09-07 DIAGNOSIS — Z955 Presence of coronary angioplasty implant and graft: Secondary | ICD-10-CM

## 2020-09-07 DIAGNOSIS — F3161 Bipolar disorder, current episode mixed, mild: Secondary | ICD-10-CM | POA: Diagnosis not present

## 2020-09-07 DIAGNOSIS — Z888 Allergy status to other drugs, medicaments and biological substances status: Secondary | ICD-10-CM

## 2020-09-07 DIAGNOSIS — Z86011 Personal history of benign neoplasm of the brain: Secondary | ICD-10-CM

## 2020-09-07 DIAGNOSIS — Z7902 Long term (current) use of antithrombotics/antiplatelets: Secondary | ICD-10-CM

## 2020-09-07 DIAGNOSIS — H918X1 Other specified hearing loss, right ear: Secondary | ICD-10-CM | POA: Diagnosis present

## 2020-09-07 DIAGNOSIS — E1169 Type 2 diabetes mellitus with other specified complication: Secondary | ICD-10-CM

## 2020-09-07 DIAGNOSIS — K219 Gastro-esophageal reflux disease without esophagitis: Secondary | ICD-10-CM | POA: Diagnosis present

## 2020-09-07 DIAGNOSIS — H538 Other visual disturbances: Secondary | ICD-10-CM | POA: Diagnosis present

## 2020-09-07 DIAGNOSIS — I25700 Atherosclerosis of coronary artery bypass graft(s), unspecified, with unstable angina pectoris: Secondary | ICD-10-CM

## 2020-09-07 DIAGNOSIS — Z91041 Radiographic dye allergy status: Secondary | ICD-10-CM

## 2020-09-07 DIAGNOSIS — Z8249 Family history of ischemic heart disease and other diseases of the circulatory system: Secondary | ICD-10-CM

## 2020-09-07 DIAGNOSIS — Z86018 Personal history of other benign neoplasm: Secondary | ICD-10-CM

## 2020-09-07 LAB — GLUCOSE, CAPILLARY
Glucose-Capillary: 178 mg/dL — ABNORMAL HIGH (ref 70–99)
Glucose-Capillary: 152 mg/dL — ABNORMAL HIGH (ref 70–99)
Glucose-Capillary: 137 mg/dL — ABNORMAL HIGH (ref 70–99)
Glucose-Capillary: 139 mg/dL — ABNORMAL HIGH (ref 70–99)
Glucose-Capillary: 223 mg/dL — ABNORMAL HIGH (ref 70–99)

## 2020-09-07 MED ORDER — ONDANSETRON HCL 4 MG PO TABS: 4.0000 mg | ORAL_TABLET | ORAL | Status: DC | PRN

## 2020-09-07 MED ORDER — PROCHLORPERAZINE 25 MG RE SUPP: 12.5000 mg | Freq: Four times a day (QID) | RECTAL | Status: DC | PRN

## 2020-09-07 MED ORDER — POLYETHYLENE GLYCOL 3350 17 G PO PACK
17.0000 g | PACK | Freq: Every day | ORAL | Status: DC | PRN
  Filled 2020-09-07: qty 1

## 2020-09-07 MED ORDER — HYDROXYZINE HCL 25 MG PO TABS: 50.0000 mg | ORAL_TABLET | Freq: Every day | ORAL | Status: DC | PRN

## 2020-09-07 MED ORDER — INSULIN ASPART 100 UNIT/ML IJ SOLN
0.0000 [IU] | Freq: Every day | INTRAMUSCULAR | Status: DC
Start: 2020-09-07 — End: 2020-09-24
  Administered 2020-09-07: 2 [IU] via SUBCUTANEOUS

## 2020-09-07 MED ORDER — DEXAMETHASONE 4 MG PO TABS
4.0000 mg | ORAL_TABLET | Freq: Three times a day (TID) | ORAL | Status: DC
  Administered 2020-09-07 – 2020-09-08 (×2): 4 mg via ORAL
  Filled 2020-09-07 (×2): qty 1

## 2020-09-07 MED ORDER — POLYETHYLENE GLYCOL 3350 17 G PO PACK
17.0000 g | PACK | Freq: Every day | ORAL | Status: DC
  Administered 2020-09-08 – 2020-09-22 (×10): 17 g via ORAL
  Filled 2020-09-07 (×18): qty 1

## 2020-09-07 MED ORDER — HYDROCODONE-ACETAMINOPHEN 10-325 MG PO TABS: 2.0000 | ORAL_TABLET | ORAL | 0 refills | Status: DC | PRN

## 2020-09-07 MED ORDER — TRIAMTERENE-HCTZ 37.5-25 MG PO TABS
1.0000 | ORAL_TABLET | Freq: Every day | ORAL | Status: DC
  Administered 2020-09-08 – 2020-09-24 (×17): 1 via ORAL
  Filled 2020-09-07 (×17): qty 1

## 2020-09-07 MED ORDER — LORATADINE 10 MG PO TABS
10.0000 mg | ORAL_TABLET | Freq: Every day | ORAL | Status: DC
  Administered 2020-09-08 – 2020-09-24 (×17): 10 mg via ORAL
  Filled 2020-09-07 (×17): qty 1

## 2020-09-07 MED ORDER — DM-GUAIFENESIN ER 30-600 MG PO TB12
1.0000 | ORAL_TABLET | Freq: Two times a day (BID) | ORAL | Status: DC
  Administered 2020-09-07 – 2020-09-24 (×34): 1 via ORAL
  Filled 2020-09-07 (×34): qty 1

## 2020-09-07 MED ORDER — FLUTICASONE PROPIONATE 50 MCG/ACT NA SUSP
2.0000 | Freq: Every day | NASAL | Status: DC | PRN
  Administered 2020-09-09: 2 via NASAL
  Filled 2020-09-07: qty 16

## 2020-09-07 MED ORDER — LEVOTHYROXINE SODIUM 112 MCG PO TABS
112.0000 ug | ORAL_TABLET | Freq: Every day | ORAL | Status: DC
  Administered 2020-09-08 – 2020-09-24 (×17): 112 ug via ORAL
  Filled 2020-09-07 (×17): qty 1

## 2020-09-07 MED ORDER — ALBUTEROL SULFATE (2.5 MG/3ML) 0.083% IN NEBU
3.0000 mL | INHALATION_SOLUTION | Freq: Four times a day (QID) | RESPIRATORY_TRACT | Status: DC | PRN
  Administered 2020-09-09: 3 mL via RESPIRATORY_TRACT
  Filled 2020-09-07: qty 3

## 2020-09-07 MED ORDER — ENOXAPARIN SODIUM 40 MG/0.4ML IJ SOSY
40.0000 mg | PREFILLED_SYRINGE | INTRAMUSCULAR | Status: DC
  Administered 2020-09-07 – 2020-09-23 (×17): 40 mg via SUBCUTANEOUS
  Filled 2020-09-07 (×17): qty 0.4

## 2020-09-07 MED ORDER — QUETIAPINE FUMARATE 50 MG PO TABS
100.0000 mg | ORAL_TABLET | Freq: Every day | ORAL | Status: DC
  Administered 2020-09-07 – 2020-09-23 (×17): 100 mg via ORAL
  Filled 2020-09-07 (×4): qty 2
  Filled 2020-09-07: qty 4
  Filled 2020-09-07 (×12): qty 2

## 2020-09-07 MED ORDER — HYDROCORTISONE ACETATE 25 MG RE SUPP: 25.0000 mg | Freq: Two times a day (BID) | RECTAL | 0 refills | Status: DC

## 2020-09-07 MED ORDER — FLEET ENEMA 7-19 GM/118ML RE ENEM
1.0000 | ENEMA | Freq: Once | RECTAL | Status: AC
  Administered 2020-09-07: 1 via RECTAL
  Filled 2020-09-07: qty 1

## 2020-09-07 MED ORDER — ACETAMINOPHEN 325 MG PO TABS
325.0000 mg | ORAL_TABLET | ORAL | Status: DC | PRN
Start: 2020-09-07 — End: 2020-09-24
  Administered 2020-09-14 – 2020-09-19 (×4): 650 mg via ORAL
  Filled 2020-09-07 (×4): qty 2

## 2020-09-07 MED ORDER — CARVEDILOL 3.125 MG PO TABS
3.1250 mg | ORAL_TABLET | Freq: Two times a day (BID) | ORAL | Status: DC
  Administered 2020-09-07 – 2020-09-15 (×16): 3.125 mg via ORAL
  Filled 2020-09-07 (×16): qty 1

## 2020-09-07 MED ORDER — ONDANSETRON HCL 4 MG/2ML IJ SOLN: 4.0000 mg | INTRAMUSCULAR | Status: DC | PRN

## 2020-09-07 MED ORDER — INSULIN ASPART 100 UNIT/ML IJ SOLN
0.0000 [IU] | Freq: Three times a day (TID) | INTRAMUSCULAR | Status: DC
Start: 2020-09-08 — End: 2020-09-24
  Administered 2020-09-08 – 2020-09-09 (×4): 2 [IU] via SUBCUTANEOUS
  Administered 2020-09-09: 3 [IU] via SUBCUTANEOUS
  Administered 2020-09-09 – 2020-09-10 (×4): 2 [IU] via SUBCUTANEOUS
  Administered 2020-09-11 (×2): 3 [IU] via SUBCUTANEOUS
  Administered 2020-09-11 – 2020-09-13 (×4): 2 [IU] via SUBCUTANEOUS
  Administered 2020-09-14: 3 [IU] via SUBCUTANEOUS
  Administered 2020-09-14: 2 [IU] via SUBCUTANEOUS
  Administered 2020-09-17: 3 [IU] via SUBCUTANEOUS
  Administered 2020-09-18 – 2020-09-19 (×3): 2 [IU] via SUBCUTANEOUS
  Administered 2020-09-20: 3 [IU] via SUBCUTANEOUS
  Administered 2020-09-20 – 2020-09-21 (×3): 2 [IU] via SUBCUTANEOUS
  Administered 2020-09-22: 3 [IU] via SUBCUTANEOUS
  Administered 2020-09-22 – 2020-09-23 (×3): 2 [IU] via SUBCUTANEOUS

## 2020-09-07 MED ORDER — BACLOFEN 10 MG PO TABS
10.0000 mg | ORAL_TABLET | Freq: Every day | ORAL | Status: DC | PRN
  Administered 2020-09-15: 10 mg via ORAL
  Filled 2020-09-07: qty 1

## 2020-09-07 MED ORDER — HYDROCODONE-ACETAMINOPHEN 10-325 MG PO TABS
1.0000 | ORAL_TABLET | ORAL | Status: DC | PRN
  Administered 2020-09-07 – 2020-09-09 (×6): 2 via ORAL
  Administered 2020-09-09: 1 via ORAL
  Administered 2020-09-09 – 2020-09-15 (×24): 2 via ORAL
  Administered 2020-09-16: 1 via ORAL
  Administered 2020-09-16 – 2020-09-17 (×5): 2 via ORAL
  Administered 2020-09-17 (×3): 1 via ORAL
  Administered 2020-09-18 – 2020-09-21 (×15): 2 via ORAL
  Administered 2020-09-21: 1 via ORAL
  Administered 2020-09-21 – 2020-09-24 (×9): 2 via ORAL
  Filled 2020-09-07 (×8): qty 2
  Filled 2020-09-07: qty 1
  Filled 2020-09-07 (×5): qty 2
  Filled 2020-09-07: qty 1
  Filled 2020-09-07: qty 2
  Filled 2020-09-07: qty 1
  Filled 2020-09-07 (×15): qty 2
  Filled 2020-09-07: qty 1
  Filled 2020-09-07 (×2): qty 2
  Filled 2020-09-07: qty 1
  Filled 2020-09-07 (×23): qty 2
  Filled 2020-09-07: qty 1
  Filled 2020-09-07 (×6): qty 2

## 2020-09-07 MED ORDER — FLUOXETINE HCL 20 MG PO CAPS
60.0000 mg | ORAL_CAPSULE | Freq: Every morning | ORAL | Status: DC
  Administered 2020-09-08 – 2020-09-12 (×5): 60 mg via ORAL
  Filled 2020-09-07 (×5): qty 3

## 2020-09-07 MED ORDER — FLEET ENEMA 7-19 GM/118ML RE ENEM
1.0000 | ENEMA | Freq: Once | RECTAL | Status: DC | PRN
  Filled 2020-09-07: qty 1

## 2020-09-07 MED ORDER — PROCHLORPERAZINE MALEATE 5 MG PO TABS
5.0000 mg | ORAL_TABLET | Freq: Four times a day (QID) | ORAL | Status: DC | PRN
  Administered 2020-09-11: 10 mg via ORAL
  Filled 2020-09-07: qty 2

## 2020-09-07 MED ORDER — PANTOPRAZOLE SODIUM 40 MG PO TBEC
40.0000 mg | DELAYED_RELEASE_TABLET | Freq: Every day | ORAL | Status: DC
  Administered 2020-09-08 – 2020-09-24 (×17): 40 mg via ORAL
  Filled 2020-09-07 (×17): qty 1

## 2020-09-07 MED ORDER — LINACLOTIDE 145 MCG PO CAPS
290.0000 ug | ORAL_CAPSULE | Freq: Every day | ORAL | Status: DC
  Administered 2020-09-08 – 2020-09-24 (×17): 290 ug via ORAL
  Filled 2020-09-07 (×17): qty 2

## 2020-09-07 MED ORDER — DOCUSATE SODIUM 100 MG PO CAPS: 100.0000 mg | ORAL_CAPSULE | Freq: Two times a day (BID) | ORAL | 0 refills | Status: DC

## 2020-09-07 MED ORDER — ROSUVASTATIN CALCIUM 20 MG PO TABS
20.0000 mg | ORAL_TABLET | Freq: Every day | ORAL | Status: DC
  Administered 2020-09-08 – 2020-09-24 (×17): 20 mg via ORAL
  Filled 2020-09-07 (×17): qty 1

## 2020-09-07 MED ORDER — AMLODIPINE BESYLATE 10 MG PO TABS
10.0000 mg | ORAL_TABLET | Freq: Every day | ORAL | Status: DC
  Administered 2020-09-08 – 2020-09-24 (×17): 10 mg via ORAL
  Filled 2020-09-07 (×17): qty 1

## 2020-09-07 MED ORDER — ALPRAZOLAM 0.5 MG PO TABS
0.5000 mg | ORAL_TABLET | Freq: Two times a day (BID) | ORAL | Status: DC | PRN
  Administered 2020-09-08 – 2020-09-24 (×30): 0.5 mg via ORAL
  Filled 2020-09-07 (×2): qty 2
  Filled 2020-09-07 (×2): qty 1
  Filled 2020-09-07 (×2): qty 2
  Filled 2020-09-07: qty 1
  Filled 2020-09-07 (×2): qty 2
  Filled 2020-09-07: qty 1
  Filled 2020-09-07 (×2): qty 2
  Filled 2020-09-07 (×3): qty 1
  Filled 2020-09-07: qty 2
  Filled 2020-09-07 (×3): qty 1
  Filled 2020-09-07: qty 2
  Filled 2020-09-07: qty 1
  Filled 2020-09-07 (×4): qty 2
  Filled 2020-09-07 (×2): qty 1
  Filled 2020-09-07: qty 2
  Filled 2020-09-07 (×2): qty 1

## 2020-09-07 MED ORDER — ALUM & MAG HYDROXIDE-SIMETH 200-200-20 MG/5ML PO SUSP: 30.0000 mL | ORAL | Status: DC | PRN

## 2020-09-07 MED ORDER — GUAIFENESIN-DM 100-10 MG/5ML PO SYRP
5.0000 mL | ORAL_SOLUTION | ORAL | Status: DC | PRN
  Administered 2020-09-07 (×2): 5 mL via ORAL
  Filled 2020-09-07 (×2): qty 5

## 2020-09-07 MED ORDER — CHLORHEXIDINE GLUCONATE CLOTH 2 % EX PADS
6.0000 | MEDICATED_PAD | Freq: Every day | CUTANEOUS | Status: DC
  Administered 2020-09-09 – 2020-09-11 (×3): 6 via TOPICAL

## 2020-09-07 MED ORDER — DIPHENHYDRAMINE HCL 12.5 MG/5ML PO ELIX
12.5000 mg | ORAL_SOLUTION | Freq: Four times a day (QID) | ORAL | Status: DC | PRN
  Administered 2020-09-14 – 2020-09-21 (×2): 25 mg via ORAL
  Filled 2020-09-07 (×2): qty 10

## 2020-09-07 MED ORDER — BISACODYL 10 MG RE SUPP
10.0000 mg | Freq: Every day | RECTAL | Status: DC | PRN
Start: 2020-09-07 — End: 2020-09-24

## 2020-09-07 MED ORDER — GUAIFENESIN-DM 100-10 MG/5ML PO SYRP: 5.0000 mL | ORAL_SOLUTION | Freq: Four times a day (QID) | ORAL | Status: DC | PRN

## 2020-09-07 MED ORDER — PROCHLORPERAZINE EDISYLATE 10 MG/2ML IJ SOLN: 5.0000 mg | Freq: Four times a day (QID) | INTRAMUSCULAR | Status: DC | PRN
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
## 2020-09-07 NOTE — Progress Notes (Signed)
Neurosurgery Service Progress Note  Subjective: No acute events overnight, no new complaints, continued focal incisional pain, feels constipated but started passing gas, got miralax x2, no abdominal pain. Having some right V1 paresthesias and some decreased sensation in the right side of the tongue and left arm/leg numbness that she states is stable from immediately post op  Objective: Vitals:   09/06/20 1620 09/06/20 2001 09/06/20 2315 09/07/20 0319  BP: 111/75 125/86 129/84 (!) 150/87  Pulse: 70 65 65 66  Resp: '20 19 19 18  '$ Temp: 98.6 F (37 C) (!) 97.4 F (36.3 C) (!) 97.5 F (36.4 C) (!) 97.4 F (36.3 C)  TempSrc: Oral Oral Oral Oral  SpO2: 99% 99% 94% 98%  Weight:      Height:        Physical Exam: AOx3, PERRL, EOMI, FS & SS, TM, Strength 5/5 x4, SILTx4, hearing decreased on right appears grossly stable, incision c/d/I, +LLE > LUE partial numbness to light touch and painful stim, propioception appears less affected   Assessment & Plan: 60 y.o. woman s/p R Kawase for tentorial meningioma, recovering well. Post-op MRI with gross total resection of tumor, some possible small areas of diffusion change in the dorsal pons.  -PT/OT rec CIR, CIR placement pending, medically ready for discharge to CIR when able -SCDs/TEDs, SQH  Judith Part  09/07/20 7:54 AM

## 2020-09-07 NOTE — Discharge Summary (Signed)
Discharge Summary  Date of Admission: 09/01/2020  Date of Discharge: 09/07/20  Attending Physician: Emelda Brothers, MD  Hospital Course: Patient was admitted following a craniotomy for resection of a dural based tumor spanning the middle and posterior fossae. She was recovered in PACU and transferred to 4N ICU. A post-op MRI showed gross total resection of the tumor with some diffusion changes in the dorsum of the right pons, away from the resection site. Post-op, she had some new left lower and upper extremity numbness without weakness as well as some V1 paresthesias. PT/OT recommended CIR. Her hospital course was uncomplicated and the patient was discharged to CIR on 09/07/20. She will follow up in clinic with me in 2 weeks.  Neurologic exam at discharge:  AOx3, PERRL, EOMI, FS, TM, decreased sensation in the right V1 distribution and decreased hearing AD Strength 5/5 x4, SILT except R V1 and LLE > LUE numbness  Discharge diagnosis: Skull base meningioma  Judith Part, MD 09/07/20 3:48 PM
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
## 2020-09-07 NOTE — Progress Notes (Signed)
Inpatient Rehab Admissions Coordinator:   Following for my colleague, Danne Baxter.  Awaiting determination from [facility] Medicare regarding request for CIR prior auth.   Shann Medal, PT, DPT Admissions Coordinator [phone] 09/07/20  10:33 AM

## 2020-09-07 NOTE — Progress Notes (Signed)
Physical Therapy Treatment Patient Details Name: Lindsay Rivera MRN: [id number] DOB: [birth year] Today's Date: 09/07/2020    History of Present Illness 60 y.o. 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 Significant PMH: bipolar disorder, CHF, COPD, CVA, MI, schizophrenia, type II diabetes mellitus.    PT Comments    Pt agreeable to session, initially expressing concerns about hospitalization thus far (I.e. "my bald spot", needing to wash up and waiting for extended period) but redirectable to mobility. Pt ambulatory in hallway with use of RW and min PT assist for LLE progression/control. Pt with persistent L sided numbness and tingling this session, and demonstrates difficulty with LLE coordination during gait secondary to this. Pt is impulsive and requires max safety cues. CIR remains appropriate dispo, PT anticipates she will progress well with intensive therapies.     Follow Up Recommendations  CIR     Equipment Recommendations  Other (comment) (TBD)    Recommendations for Other Services       Precautions / Restrictions Precautions Precautions: Fall Restrictions Weight Bearing Restrictions: No    Mobility  Bed Mobility Overal bed mobility: Needs Assistance Bed Mobility: Supine to Sit;Sit to Supine     Supine to sit: Supervision Sit to supine: Supervision   General bed mobility comments: for safety, increased time with use of bedrails and min HOB elevation to perform.    Transfers Overall transfer level: Needs assistance Equipment used: Rolling walker (2 wheeled) Transfers: Sit to/from Stand Sit to Stand: Min assist         General transfer comment: min assist to rise, steady; pt with improper hand placement when rising and sitting but moving too quickly for cuing. STS x3, from EOB, chair x2.  Ambulation/Gait Ambulation/Gait assistance: Min assist Gait Distance (Feet): 45 Feet Assistive device:  Rolling walker (2 wheeled) Gait Pattern/deviations: Step-to pattern;Decreased step length - left;Decreased dorsiflexion - left;Decreased weight shift to left;Ataxic;Trunk flexed;Narrow base of support Gait velocity: decr   General Gait Details: min assist for advancing as well as flexing LLE during swing phase, steadying. Cues for widening BOS, increasing L foot clearance   Stairs             Wheelchair Mobility    Modified Rankin (Stroke Patients Only) Modified Rankin (Stroke Patients Only) Pre-Morbid Rankin Score: No symptoms Modified Rankin: Moderately severe disability     Balance Overall balance assessment: Needs assistance Sitting-balance support: Feet supported Sitting balance-Leahy Scale: Good     Standing balance support: Bilateral upper extremity supported;During functional activity Standing balance-Leahy Scale: Poor Standing balance comment: reliant on external support                            Cognition Arousal/Alertness: Awake/alert Behavior During Therapy: Impulsive Overall Cognitive Status: Impaired/Different from baseline Area of Impairment: Attention;Following commands;Safety/judgement;Problem solving;Awareness                   Current Attention Level: Sustained   Following Commands: Follows one step commands with increased time Safety/Judgement: Decreased awareness of safety;Decreased awareness of deficits Awareness: Emergent Problem Solving: Slow processing;Requires verbal cues;Requires tactile cues General Comments: Pt is impulsive this session, for example at one point wants to show PT how she lowers herself to floor and immediately initiates lower to floor before PT can request otherwise. Pt perseverative on "bald spot" at her L occiput, states she is suing [facility] for this. Pt requires cuing for safety throughout  mobility.      Exercises      General Comments        Pertinent Vitals/Pain Pain Assessment: Faces Faces  Pain Scale: Hurts even more Pain Location: R side head, LLE Pain Descriptors / Indicators: Tingling;Sore Pain Intervention(s): Repositioned;Monitored during session;Limited activity within patient's tolerance    Home Living                      Prior Function            PT Goals (current goals can now be found in the care plan section) Acute Rehab PT Goals Patient Stated Goal: go to CIR PT Goal Formulation: With patient Time For Goal Achievement: 09/16/20 Potential to Achieve Goals: Good Progress towards PT goals: Progressing toward goals    Frequency    Min 4X/week      PT Plan Current plan remains appropriate    Co-evaluation              AM-PAC PT "6 Clicks" Mobility   Outcome Measure  Help needed turning from your back to your side while in a flat bed without using bedrails?: None Help needed moving from lying on your back to sitting on the side of a flat bed without using bedrails?: A Little Help needed moving to and from a bed to a chair (including a wheelchair)?: A Little Help needed standing up from a chair using your arms (e.g., wheelchair or bedside chair)?: A Little Help needed to walk in hospital room?: A Little Help needed climbing 3-5 steps with a railing? : A Lot 6 Click Score: 18    End of Session Equipment Utilized During Treatment: Gait belt Activity Tolerance: Patient tolerated treatment well Patient left: in bed;with call bell/phone within reach;with bed alarm set Nurse Communication: Mobility status PT Visit Diagnosis: Pain;Difficulty in walking, not elsewhere classified (R26.2);Muscle weakness (generalized) (M62.81)     Time: [id number] PT Time Calculation (min) (ACUTE ONLY): 26 min  Charges:  $Gait Training: 8-22 mins $Therapeutic Activity: 8-22 mins                    Stacie Glaze, PT DPT Acute Rehabilitation Services Pager [phone]  Office [phone]   [place] 09/07/2020, 3:26 PM

## 2020-09-07 NOTE — Progress Notes (Signed)
Inpatient Rehab Admissions Coordinator:   I have insurance approval and a bed available for pt to admit to CIR today.  Dr. Zada Finders in agreement. I will let pt/family and TOC know.   Shann Medal, PT, DPT Admissions Coordinator [phone] 09/07/20  3:30 PM

## 2020-09-08 DIAGNOSIS — I10 Essential (primary) hypertension: Secondary | ICD-10-CM | POA: Diagnosis not present

## 2020-09-08 DIAGNOSIS — D329 Benign neoplasm of meninges, unspecified: Secondary | ICD-10-CM | POA: Diagnosis not present

## 2020-09-08 DIAGNOSIS — F3161 Bipolar disorder, current episode mixed, mild: Secondary | ICD-10-CM | POA: Diagnosis not present

## 2020-09-08 DIAGNOSIS — K5901 Slow transit constipation: Secondary | ICD-10-CM | POA: Diagnosis not present

## 2020-09-08 LAB — COMPREHENSIVE METABOLIC PANEL
ALT: 76 U/L — ABNORMAL HIGH (ref 0–44)
AST: 42 U/L — ABNORMAL HIGH (ref 15–41)
Albumin: 4 g/dL (ref 3.5–5.0)
Alkaline Phosphatase: 125 U/L (ref 38–126)
Anion gap: 11 (ref 5–15)
BUN: 25 mg/dL — ABNORMAL HIGH (ref 6–20)
CO2: 32 mmol/L (ref 22–32)
Calcium: 10.7 mg/dL — ABNORMAL HIGH (ref 8.9–10.3)
Chloride: 94 mmol/L — ABNORMAL LOW (ref 98–111)
Creatinine, Ser: 0.85 mg/dL (ref 0.44–1.00)
GFR, Estimated: >60 mL/min (ref >60)
Glucose, Bld: 138 mg/dL — ABNORMAL HIGH (ref 70–99)
Potassium: 4.2 mmol/L (ref 3.5–5.1)
Sodium: 137 mmol/L (ref 135–145)
Total Bilirubin: 0.4 mg/dL (ref 0.3–1.2)
Total Protein: 8.4 g/dL — ABNORMAL HIGH (ref 6.5–8.1)

## 2020-09-08 LAB — CBC WITH DIFFERENTIAL/PLATELET
Abs Immature Granulocytes: 0.46 10*3/uL — ABNORMAL HIGH (ref 0.00–0.07)
Basophils Absolute: 0.1 10*3/uL (ref 0.0–0.1)
Basophils Relative: 0 %
Eosinophils Absolute: 0 10*3/uL (ref 0.0–0.5)
Eosinophils Relative: 0 %
HCT: 42.6 % (ref 36.0–46.0)
Hemoglobin: 14.3 g/dL (ref 12.0–15.0)
Immature Granulocytes: 2 %
Lymphocytes Relative: 7 %
Lymphs Abs: 1.5 10*3/uL (ref 0.7–4.0)
MCH: 29.4 pg (ref 26.0–34.0)
MCHC: 33.6 g/dL (ref 30.0–36.0)
MCV: 87.5 fL (ref 80.0–100.0)
Monocytes Absolute: 1.1 10*3/uL — ABNORMAL HIGH (ref 0.1–1.0)
Monocytes Relative: 5 %
Neutro Abs: 17.6 10*3/uL — ABNORMAL HIGH (ref 1.7–7.7)
Neutrophils Relative %: 86 %
Platelets: 468 10*3/uL — ABNORMAL HIGH (ref 150–400)
RBC: 4.87 MIL/uL (ref 3.87–5.11)
RDW: 14.6 % (ref 11.5–15.5)
WBC: 20.6 10*3/uL — ABNORMAL HIGH (ref 4.0–10.5)
nRBC: 0 % (ref 0.0–0.2)

## 2020-09-08 LAB — GLUCOSE, CAPILLARY
Glucose-Capillary: 144 mg/dL — ABNORMAL HIGH (ref 70–99)
Glucose-Capillary: 144 mg/dL — ABNORMAL HIGH (ref 70–99)
Glucose-Capillary: 136 mg/dL — ABNORMAL HIGH (ref 70–99)
Glucose-Capillary: 138 mg/dL — ABNORMAL HIGH (ref 70–99)

## 2020-09-08 MED ORDER — GABAPENTIN 100 MG PO CAPS
100.0000 mg | ORAL_CAPSULE | Freq: Three times a day (TID) | ORAL | Status: DC
  Administered 2020-09-08 – 2020-09-09 (×4): 100 mg via ORAL
  Filled 2020-09-08 (×4): qty 1

## 2020-09-08 MED ORDER — DEXAMETHASONE 4 MG PO TABS
4.0000 mg | ORAL_TABLET | Freq: Two times a day (BID) | ORAL | Status: DC
  Administered 2020-09-08 – 2020-09-09 (×3): 4 mg via ORAL
  Filled 2020-09-08 (×3): qty 1

## 2020-09-08 NOTE — Progress Notes (Addendum)
PROGRESS NOTE   Subjective/Complaints: Pt upset that therapy is 30 minutes late. Upset that she's being mistreated as a "black woman". Continued to report problems she had on acute side of hospital. Also having headache and significant pain along the left side of body.   ROS: Limited due to cognitive/behavioral    Objective:   No results found. Recent Labs    09/08/20 0521  WBC 20.6*  HGB 14.3  HCT 42.6  PLT 468*   Recent Labs    09/08/20 0521  NA 137  K 4.2  CL 94*  CO2 32  GLUCOSE 138*  BUN 25*  CREATININE 0.85  CALCIUM 10.7*    Intake/Output Summary (Last 24 hours) at 09/08/2020 [id number] Last data filed at 09/08/2020 0558 Gross per 24 hour  Intake 120 ml  Output --  Net 120 ml        Physical Exam: Vital Signs Blood pressure (!) 123/94, pulse 66, temperature 97.7 F (36.5 C), temperature source Oral, resp. rate 17, height '5\' 2"'$  (1.575 m), weight 70.4 kg, SpO2 98 %.  General: Alert and oriented x 3, No apparent distress HEENT: Head is normocephalic, atraumatic, PERRLA, EOMI, sclera anicteric, oral mucosa pink and moist, dentition intact, ext ear canals clear,  Neck: Supple without JVD or lymphadenopathy Heart: Reg rate and rhythm. No murmurs rubs or gallops Chest: CTA bilaterally without wheezes, rales, or rhonchi; no distress Abdomen: Soft, non-tender, non-distended, bowel sounds positive. Extremities: No clubbing, cyanosis, or edema. Pulses are 2+ Psych: extremely anxious and irritable, labile Skin: right crani incision CDI. Right cheek pressure injury is open to air, clean,pink Neuro:  decreased hearing right ear.. Sensory exam decreased to LT along right peri-oral area, left face? And left arm and leg  moves all 4's. Musculoskeletal: Full ROM, No pain with AROM or PROM in the neck, trunk, or extremities. Posture appropriate    Assessment/Plan: 1. Functional deficits which require 3+ hours per day of  interdisciplinary therapy in a comprehensive inpatient rehab setting. Physiatrist is providing close team supervision and 24 hour management of active medical problems listed below. Physiatrist and rehab team continue to assess barriers to discharge/monitor patient progress toward functional and medical goals  Care Tool:  Bathing              Bathing assist       Upper Body Dressing/Undressing Upper body dressing   What is the patient wearing?: Hospital gown only    Upper body assist Assist Level: Minimal Assistance - Patient > 75%    Lower Body Dressing/Undressing Lower body dressing            Lower body assist       Toileting Toileting    Toileting assist Assist for toileting: Maximal Assistance - Patient 25 - 49%     Transfers Chair/bed transfer  Transfers assist           Locomotion Ambulation   Ambulation assist              Walk 10 feet activity   Assist           Walk 50 feet activity   Assist  Walk 150 feet activity   Assist           Walk 10 feet on uneven surface  activity   Assist           Wheelchair     Assist               Wheelchair 50 feet with 2 turns activity    Assist            Wheelchair 150 feet activity     Assist          Blood pressure (!) 123/94, pulse 66, temperature 97.7 F (36.5 C), temperature source Oral, resp. rate 17, height '5\' 2"'$  (1.575 m), weight 70.4 kg, SpO2 98 %. Medical Problem List and Plan: 1.  Functional and mobility deficits secondary to right tentorial meningioma s/p resection 09/01/20             -patient may shower             -ELOS/Goals: 1-2 weeks, mod I to supervision  -begin therapies today 2.  Antithrombotics: -DVT/anticoagulation:  Pharmaceutical: Lovenox             -antiplatelet therapy: N/A 3. Headaches/Pain Management:  Continue hydrocodone prn--  -added gabapentin for neuropathic pain '100mg'$  tid.  4. Mood: Team  to provide ego support. LCSW to follow for evaluation and support.              -antipsychotic agents:  seroquel 5. Neuropsych: This patient is capable of making decisions on her own behalf. 6. Skin/Wound Care: Monitor incision for healing.   -keep facial wound clean, no dressing required 7. Fluids/Electrolytes/Nutrition: BUN up  -encourage fluids 8. HTN: Monitor BP tid--continue Norvasc and Maxizide -borderline, behavior not helping, 9. T2DM: Hgb A1c-             --monitor BS ac/hs and use SSI for elevated BS. 10. H/o bipolar d/o: Continue schedule Prozac and Seroquel. --Continue Xanax bid prn w/ atarax prn.  -pt needs ego support and someone to listen. -would benefit from neuropsych visit -gabapentin added. May need to add day time seroquel dose 11. Constipation: On Linzess. scheduled miralax               --she does not want any liquid laxatives--   -still no bm  -dulcolax supp prn 12. OSA: Has been compliant with CPAP 13. Chronic cough: For past 3-4 months. scheduled Mucinex DM.             --pulmonary hygiene with flutter valve. 14. Cerebral edema: On decadron 4 mg every 8 hours-->could be exacerbating mood.              -taper decadron to q12      LOS: 1 days A FACE TO FACE EVALUATION WAS PERFORMED  Meredith Staggers 09/08/2020, 9:22 AM

## 2020-09-08 NOTE — Plan of Care (Signed)
  Problem: RH Balance Goal: LTG: Patient will maintain dynamic sitting balance (OT) Description: LTG:  Patient will maintain dynamic sitting balance with assistance during activities of daily living (OT) Flowsheets (Taken 09/08/2020 1301) LTG: Pt will maintain dynamic sitting balance during ADLs with: Independent   Problem: Sit to Stand Goal: LTG:  Patient will perform sit to stand in prep for activites of daily living with assistance level (OT) Description: LTG:  Patient will perform sit to stand in prep for activites of daily living with assistance level (OT) Flowsheets (Taken 09/08/2020 1301) LTG: PT will perform sit to stand in prep for activites of daily living with assistance level: Supervision/Verbal cueing   Problem: RH Eating Goal: LTG Patient will perform eating w/assist, cues/equip (OT) Description: LTG: Patient will perform eating with assist, with/without cues using equipment (OT) Flowsheets (Taken 09/08/2020 1301) LTG: Pt will perform eating with assistance level of: Independent   Problem: RH Grooming Goal: LTG Patient will perform grooming w/assist,cues/equip (OT) Description: LTG: Patient will perform grooming with assist, with/without cues using equipment (OT) Flowsheets (Taken 09/08/2020 1301) LTG: Pt will perform grooming with assistance level of: Independent   Problem: RH Bathing Goal: LTG Patient will bathe all body parts with assist levels (OT) Description: LTG: Patient will bathe all body parts with assist levels (OT) Flowsheets (Taken 09/08/2020 1301) LTG: Pt will perform bathing with assistance level/cueing: Supervision/Verbal cueing   Problem: RH Dressing Goal: LTG Patient will perform upper body dressing (OT) Description: LTG Patient will perform upper body dressing with assist, with/without cues (OT). Flowsheets (Taken 09/08/2020 1301) LTG: Pt will perform upper body dressing with assistance level of: Independent with assistive device Goal: LTG Patient will perform  lower body dressing w/assist (OT) Description: LTG: Patient will perform lower body dressing with assist, with/without cues in positioning using equipment (OT) Flowsheets (Taken 09/08/2020 1301) LTG: Pt will perform lower body dressing with assistance level of: Supervision/Verbal cueing   Problem: RH Toileting Goal: LTG Patient will perform toileting task (3/3 steps) with assistance level (OT) Description: LTG: Patient will perform toileting task (3/3 steps) with assistance level (OT)  Flowsheets (Taken 09/08/2020 1301) LTG: Pt will perform toileting task (3/3 steps) with assistance level: Supervision/Verbal cueing   Problem: RH Functional Use of Upper Extremity Goal: LTG Patient will use RT/LT upper extremity as a (OT) Description: LTG: Patient will use right/left upper extremity as a stabilizer/gross assist/diminished/nondominant/dominant level with assist, with/without cues during functional activity (OT) Flowsheets (Taken 09/08/2020 1301) LTG: Use of upper extremity in functional activities: LUE as nondominant level   Problem: RH Toilet Transfers Goal: LTG Patient will perform toilet transfers w/assist (OT) Description: LTG: Patient will perform toilet transfers with assist, with/without cues using equipment (OT) Flowsheets (Taken 09/08/2020 1301) LTG: Pt will perform toilet transfers with assistance level of: Supervision/Verbal cueing   Problem: RH Tub/Shower Transfers Goal: LTG Patient will perform tub/shower transfers w/assist (OT) Description: LTG: Patient will perform tub/shower transfers with assist, with/without cues using equipment (OT) Flowsheets (Taken 09/08/2020 1301) LTG: Pt will perform tub/shower stall transfers with assistance level of: Supervision/Verbal cueing
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
## 2020-09-08 NOTE — Progress Notes (Signed)
Patient ID: Lindsay Rivera, female   DOB: 1960/07/30, 60 y.o.   MRN: [id number]  SW met with pt in room to introduce self, explain role, and discuss discharge process. SW shared her ELOS is 10-12 days. Pt recognizes she needs help but not necessarily want to accept it. She has been encouraged to utilize therapy so she can be independent as possible when she goes home.  Pt aware SW will f/u once there is a discharge date confirmed.   *SW spoke with pt son [name] [phone]) to introduce self, explain role, and discuss discharge process. SW informed on above. He confirms pt will d/c to her home, however, he works 7am-7pm (fluctuates each month), and girlfriend works 8am-5pm M-F. Pt will need assistance from 7am-6pm. He stated he is working on getting his nephew to come for a few days but it is not confirmed yet. SW will continue to provide updates.   Loralee Pacas, MSW, [place] Office: [phone] Cell: [phone] Fax: [phone]

## 2020-09-08 NOTE — Progress Notes (Signed)
Occupational Therapy Session Note  Patient Details  Name: Lindsay Rivera MRN: [id number] Date of Birth: 09-Mar-1960  Today's Date: 09/08/2020 OT Individual Time: 0902-1000 OT Individual Time Calculation (min): 58 min    Short Term Goals: Week 1:  OT Short Term Goal 1 (Week 1): Petient will maintain standing balance with CGA within functional BADL task OT Short Term Goal 2 (Week 1): Pt will complete 2 steps of toileting OT Short Term Goal 3 (Week 1): Pt will use L UE at dimihsed level with min questioning cues.  Skilled Therapeutic Interventions/Progress Updates:    Pt sidelying in bed with lights off and eyes closed.  Upon entering, pt reporting severe pain 9/10 in right ear.  Reporting she doesn't feel up to doing therapy right now but maybe if she feels better tomorrow. Nurse notified of pts pain level and location.  Pt requesting to use toilet.  Supine to sit with CGA.  Stand pivot from EOB to w/c with mod assist.  Transported to bathroom via w/c. Stand pivot w/c to 3 in 1 commode with min assist using grab bar.  Clothing mgt and pericare completed with CGA.  Stand pivot back to w/c with vcs for hand placement and min assist.  Washed hands sitting sinkside with setup.  Stand pivot w/c to EOB with CGA using RUE to hold bed rail.  Sit to supine with supervision.  Call bell in reach, bed alarm on.  Direct hand off to nurse to address pts pain.  Therapy Documentation Precautions:  Precautions Precautions: Fall Restrictions Weight Bearing Restrictions: No    Therapy/Group: Individual Therapy  Ezekiel Slocumb 09/08/2020, 12:58 PM
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
## 2020-09-08 NOTE — Patient Care Conference (Signed)
Inpatient RehabilitationTeam Conference and Plan of Care Update Date: 09/08/2020   Time: 10:45 AM    Patient Name: Lindsay Rivera      Medical Record Number: [id number]  Date of Birth: [birth year] Sex: Female         Room/Bed: 4W13C/4W13C-01 Payor Info: Payor: [employer] MEDICARE / Plan: Charmian Muff DUAL COMPLETE / Product Type: *No Product type* /    Admit Date/Time:  09/07/2020  5:06 PM  Primary Diagnosis:  Meningioma [facility])  Hospital Problems: Principal Problem:   Meningioma [facility]) Active Problems:   S/P resection of meningioma    Expected Discharge Date: Expected Discharge Date:  (10-12 days)  Team Members Present: Physician leading conference: Dr. Alger Simons Social Worker Present: Loralee Pacas, [place] Nurse Present: Dorthula Nettles, RN PT Present: Tereasa Coop, PT OT Present: Cherylynn Ridges, OT PPS Coordinator present : Gunnar Fusi, SLP     Current Status/Progress Goal Weekly Team Focus  Bowel/Bladder   continent of bladder and bowel, LBM 8/1 after fleet enema  regular bowel movement      Swallow/Nutrition/ Hydration             ADL's   Mod A ambulation, Min A ADLs  Supervision  self-care retraining, L NMR, sensory loss, activity tolerance   Mobility             Communication             Safety/Cognition/ Behavioral Observations            Pain   pain on R head/incision  pain<3/10  assess pain Q shift and PRN   Skin   Surgical incision R head with sutures, no drainage  healing of incision  assess skin and incision Q shift and PRN     Discharge Planning:  Pt to be assessed. PER EMR, pt to go home with her son and his girlfriend at time of discharge. SW to confirm no barriers to discharge.   Team Discussion: Did not want to come, then changed her mind. Left hemisensory loss, right hearing loss. CPAP caused pressure injury to face, hx of bipolar disorder. Continent B/B, 9/10 pain with Norco available. Incision and pressure injury. Mod assist  with ambulation. Poor safety awareness. Showered today. PT eval pending. Patient on target to meet rehab goals: Evals still pending at this time.  *See Care Plan and progress notes for long and short-term goals.   Revisions to Treatment Plan:  Not at this time.  Teaching Needs: Family education, medication management, pain management, skin/wound care, transfer training, gait training, balance training, endurance training, safety awareness.  Current Barriers to Discharge: Decreased caregiver support, Medical stability, Home enviroment access/layout, Wound care, Lack of/limited family support, Weight, Medication compliance, and Behavior  Possible Resolutions to Barriers: Continue current medications, provide emotional support.     Medical Summary Current Status: right meningioma resection with left hemisensory loss and associated pain. emotionally labile, bp elevated.  Barriers to Discharge: Behavior;Medical stability   Possible Resolutions to [employer] Focus: behavioral mgt, daily assessment of labs,data   Continued Need for Acute Rehabilitation Level of Care: The patient requires daily medical management by a physician with specialized training in physical medicine and rehabilitation for the following reasons: Direction of a multidisciplinary physical rehabilitation program to maximize functional independence : Yes Medical management of patient stability for increased activity during participation in an intensive rehabilitation regime.: Yes Analysis of laboratory values and/or radiology reports with any subsequent need for medication adjustment and/or medical intervention. : Yes  I attest that I was present, lead the team conference, and concur with the assessment and plan of the team.   Cristi Loron 09/08/2020, 4:52 PM

## 2020-09-08 NOTE — Progress Notes (Signed)
Patient arrived to floor, no acute distress noted. Patient alert and oriented. Pt oriented to floor,skin assessment performed. Pt tolerated well

## 2020-09-08 NOTE — Progress Notes (Signed)
Inpatient Rehabilitation  Patient information reviewed and entered into eRehab system by Lateefa Crosby M. Bright Spielmann, M.A., CCC/SLP, PPS Coordinator.  Information including medical coding, functional ability and quality indicators will be reviewed and updated through discharge.    

## 2020-09-08 NOTE — Plan of Care (Signed)
  Problem: Consults Goal: RH BRAIN INJURY PATIENT EDUCATION Description: Description: See Patient Education module for eduction specifics Outcome: Progressing Goal: Skin Care Protocol Initiated - if Braden Score 18 or less Description: If consults are not indicated, leave blank or document N/A Outcome: Progressing Goal: Diabetes Guidelines if Diabetic/Glucose > 140 Description: If diabetic or lab glucose is > 140 mg/dl - Initiate Diabetes/Hyperglycemia Guidelines & Document Interventions  Outcome: Progressing   Problem: RH BOWEL ELIMINATION Goal: RH STG MANAGE BOWEL WITH ASSISTANCE Description: STG Manage Bowel with Supervision Assistance. Outcome: Progressing   Problem: RH SKIN INTEGRITY Goal: RH STG ABLE TO PERFORM INCISION/WOUND CARE W/ASSISTANCE Description: STG Able To Perform Incision/Wound Care With Supervision Assistance. Outcome: Progressing   Problem: RH SAFETY Goal: RH STG ADHERE TO SAFETY PRECAUTIONS W/ASSISTANCE/DEVICE Description: STG Adhere to Safety Precautions With Cues and Reminders. Outcome: Progressing   Problem: RH COGNITION-NURSING Goal: RH STG USES MEMORY AIDS/STRATEGIES W/ASSIST TO PROBLEM SOLVE Description: STG Uses Memory Aids/Strategies With Cues and Reminders to Problem Solve. Outcome: Progressing   Problem: RH PAIN MANAGEMENT Goal: RH STG PAIN MANAGED AT OR BELOW PT'S PAIN GOAL Description: < 4 on a 0-10 pain scale. Outcome: Progressing   Problem: RH KNOWLEDGE DEFICIT BRAIN INJURY Goal: RH STG INCREASE KNOWLEDGE OF SELF CARE AFTER BRAIN INJURY Description: Patient will demonstrate knowledge of medication management, pain management, skin/wound care with educational materials and handouts provided by staff independently at discharge. Outcome: Progressing
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
## 2020-09-09 DIAGNOSIS — F3161 Bipolar disorder, current episode mixed, mild: Secondary | ICD-10-CM | POA: Diagnosis not present

## 2020-09-09 DIAGNOSIS — K5901 Slow transit constipation: Secondary | ICD-10-CM | POA: Diagnosis not present

## 2020-09-09 DIAGNOSIS — I10 Essential (primary) hypertension: Secondary | ICD-10-CM | POA: Diagnosis not present

## 2020-09-09 DIAGNOSIS — D329 Benign neoplasm of meninges, unspecified: Secondary | ICD-10-CM | POA: Diagnosis not present

## 2020-09-09 LAB — GLUCOSE, CAPILLARY
Glucose-Capillary: 134 mg/dL — ABNORMAL HIGH (ref 70–99)
Glucose-Capillary: 163 mg/dL — ABNORMAL HIGH (ref 70–99)
Glucose-Capillary: 138 mg/dL — ABNORMAL HIGH (ref 70–99)
Glucose-Capillary: 167 mg/dL — ABNORMAL HIGH (ref 70–99)

## 2020-09-09 MED ORDER — GABAPENTIN 300 MG PO CAPS
300.0000 mg | ORAL_CAPSULE | Freq: Three times a day (TID) | ORAL | Status: DC
  Administered 2020-09-09 – 2020-09-12 (×9): 300 mg via ORAL
  Filled 2020-09-09 (×9): qty 1

## 2020-09-09 NOTE — Progress Notes (Signed)
Physical Therapy Session Note  Patient Details  Name: Lindsay Rivera MRN: [id number] Date of Birth: Sep 22, 1960  Today's Date: 09/09/2020 PT Individual Time: [id number] PT Individual Time Calculation (min): 70 min  and Today's Date: 09/09/2020 PT Missed Time: 75 Minutes Missed Time Reason: Patient fatigue;Patient unwilling to participate  Short Term Goals: Week 1:  PT Short Term Goal 1 (Week 1): Pt will perform bed to chair transfer with CGA. PT Short Term Goal 2 (Week 1): Pt will ambulate x150' with CGA. PT Short Term Goal 3 (Week 1): Pt will complete x4 steps with BHRs and CGA.  Skilled Therapeutic Interventions/Progress Updates:     Pt received supine in bed and agrees to therapy. Reports some pain in R side of head and R ear. Number not provided. PT provides rest breaks as needed for pain management. Supine to sit with ved features and cues for positioning. Pt performs Stand pivot transfer to [facility] with minA and cues for sequencing and positioning. WC transport to gym for time management. Pt transfer to [place] with minA. Pt completes for strength and endurance training as well as for reciprocal coordination. Pt completes 5x3:00 with rest breaks between bouts, at workload of 4 with cues for correct performance. Pt transfer to [facility] with stand pivot and minA. WC transport to gym for time management. Pt then performs stair training multiple bouts for strengthening of L lower extremity as well as NMR and functional mobility training. Pt initially performs step-to technique with CGA from PT, progressing to reciprocal stair ascension. PT educates on using reciprocal pattern for strengthening and NMR but that step-to pattern is safest when performing stairs following DC. Pt verbalizes understanding. Pt completes 3x20 steps with seated rest breaks. Pt then self propels WC x100' for upper extremity strength and endurance training. Stand pivot back to bed with minA. Left supine with alarm intact and all needs  within reach.  2nd Session: Pt asleep in bed upon PT arrival. Awoken with verbal and tactile cues. Pt states that she feels nauseous and requests to rest at this time. RN aware. PT will follow up as able.  Therapy Documentation Precautions:  Precautions Precautions: Fall Restrictions Weight Bearing Restrictions: No   Therapy/Group: Individual Therapy  Breck Coons, PT, DPT 09/09/2020, 4:25 PM

## 2020-09-09 NOTE — Progress Notes (Signed)
PROGRESS NOTE   Subjective/Complaints: Tells me that she's still having pain, right ear pain/fullness. "I can't do therapy until 11". Says that she took '800mg'$  gabapentin at home "prn". Says that therapy didn't go all that bad yesterday.  ROS: Limited due to cognitive/behavioral    Objective:   No results found. Recent Labs    09/08/20 0521  WBC 20.6*  HGB 14.3  HCT 42.6  PLT 468*   Recent Labs    09/08/20 0521  NA 137  K 4.2  CL 94*  CO2 32  GLUCOSE 138*  BUN 25*  CREATININE 0.85  CALCIUM 10.7*    Intake/Output Summary (Last 24 hours) at 09/09/2020 [id number] Last data filed at 09/09/2020 0750 Gross per 24 hour  Intake 720 ml  Output --  Net 720 ml        Physical Exam: Vital Signs Blood pressure (!) 150/96, pulse 68, temperature 98.7 F (37.1 C), temperature source Oral, resp. rate 14, height '5\' 2"'$  (1.575 m), weight 70.4 kg, SpO2 99 %.  Constitutional: No distress . Vital signs reviewed. HEENT: EOMI, oral membranes moist Neck: supple Cardiovascular: RRR without murmur. No JVD    Respiratory/Chest: CTA Bilaterally without wheezes or rales. Normal effort    GI/Abdomen: BS +, non-tender, non-distended Ext: no clubbing, cyanosis, or edema Psych: anxious, less labile Skin: right crani incision CDI. Right cheek pressure injury is open to air, clean,pink Neuro:  decreased hearing right ear.. Sensory exam decreased to LT along right peri-oral area, left face? And left arm and leg  moves all 4's.--neuro exam unchanged today Musculoskeletal: Full ROM, No pain with AROM or PROM in the neck, trunk, or extremities. Posture appropriate    Assessment/Plan: 1. Functional deficits which require 3+ hours per day of interdisciplinary therapy in a comprehensive inpatient rehab setting. Physiatrist is providing close team supervision and 24 hour management of active medical problems listed below. Physiatrist and rehab team  continue to assess barriers to discharge/monitor patient progress toward functional and medical goals  Care Tool:  Bathing    Body parts bathed by patient: Right arm, Left arm         Bathing assist Assist Level: Minimal Assistance - Patient > 75%     Upper Body Dressing/Undressing Upper body dressing   What is the patient wearing?: Pull over shirt    Upper body assist Assist Level: Minimal Assistance - Patient > 75%    Lower Body Dressing/Undressing Lower body dressing      What is the patient wearing?: Underwear/pull up     Lower body assist Assist for lower body dressing: Minimal Assistance - Patient > 75%     Toileting Toileting    Toileting assist Assist for toileting: Contact Guard/Touching assist     Transfers Chair/bed transfer  Transfers assist     Chair/bed transfer assist level: Minimal Assistance - Patient > 75%     Locomotion Ambulation   Ambulation assist      Assist level: Minimal Assistance - Patient > 75% Assistive device: No Device Max distance: 100'   Walk 10 feet activity   Assist     Assist level: Minimal Assistance - Patient > 75%  Walk 50 feet activity   Assist    Assist level: Minimal Assistance - Patient > 75%      Walk 150 feet activity   Assist Walk 150 feet activity did not occur: Safety/medical concerns         Walk 10 feet on uneven surface  activity   Assist     Assist level: Minimal Assistance - Patient > 75% Assistive device:  (R hand rail)   Wheelchair     Assist Will patient use wheelchair at discharge?: No             Wheelchair 50 feet with 2 turns activity    Assist            Wheelchair 150 feet activity     Assist          Blood pressure (!) 150/96, pulse 68, temperature 98.7 F (37.1 C), temperature source Oral, resp. rate 14, height '5\' 2"'$  (1.575 m), weight 70.4 kg, SpO2 99 %. Medical Problem List and Plan: 1.  Functional and mobility deficits  secondary to right tentorial meningioma s/p resection 09/01/20             -patient may shower             -ELOS/Goals: 1-2 weeks, mod I to supervision  -Continue CIR therapies including PT, OT  2.  Antithrombotics: -DVT/anticoagulation:  Pharmaceutical: Lovenox             -antiplatelet therapy: N/A 3. Headaches/Pain Management:  Continue hydrocodone prn--  -increase gabapentin for neuropathic pain to 300 mg tid. told her that this medication is more effective if it's scheduled. 4. Mood: Team to provide ego support. LCSW to follow for evaluation and support.              -antipsychotic agents:  seroquel 5. Neuropsych: This patient is capable of making decisions on her own behalf. 6. Skin/Wound Care: Monitor incision for healing.   -keep facial wound clean, no dressing required 7. Fluids/Electrolytes/Nutrition: BUN up  -encourage fluids 8. HTN: Monitor BP tid--continue Norvasc and Maxizide -borderline, behavior not helping, 9. T2DM: Hgb A1c-             --monitor BS ac/hs and use SSI for elevated BS. 10. H/o bipolar d/o: Continue schedule Prozac and Seroquel. --Continue Xanax bid prn w/ atarax prn.  -pt needs ego support and someone to listen. -would benefit from neuropsych visit -gabapentin as above. 11. Constipation: On Linzess. scheduled miralax               --she does not want any liquid laxatives--   -still no bm  -dulcolax supp prn 12. OSA: Has been compliant with CPAP 13. Chronic cough: For past 3-4 months. scheduled Mucinex DM.             --pulmonary hygiene with flutter valve. 14. Cerebral edema: On decadron 4 mg every 8 hours-->could be exacerbating mood.              -tapered decadron to q12      LOS: 2 days A FACE TO FACE EVALUATION WAS PERFORMED  Meredith Staggers 09/09/2020, 8:33 AM

## 2020-09-09 NOTE — Progress Notes (Signed)
Occupational Therapy Note  Patient Details  Name: MARYMAR VANDERPLAATS MRN: [id number] Date of Birth: 1960/10/29  Today's Date: 09/09/2020 OT Missed Time: 41 Minutes Missed Time Reason: Patient ill (comment) (reporting nausea and "feeling bad")  Pt declines tx when OT arrives for morning session. Pt stating, "I thought I told them I wasn't doing my first session." Pt reporting nausea and feeling bad as reason not going to do tx despite encouragement to perform ADLs to get ready for PT session.  Returned later in afternoon (pt already refused afternoon PT session) to make up missed time, however pt asleep and does not want to complete tx.    Lowella Dell Namiah Dunnavant 09/09/2020, 2:36 PM

## 2020-09-09 NOTE — Progress Notes (Addendum)
Pt has her home cpap machine and stated that she will put the cpap on herself tonight.

## 2020-09-09 NOTE — Progress Notes (Signed)
Pt went to sleep mid shift, pt has not been observed using CPAP machine. Pt noted to be anxious, labile with pressured speech during assessment, however compliant with staff. No aggressive behavior noted. PRN xanax-anxiety, norco x2 for headache-effective. Pt noted with reported BP 144/113, with manual recheck of 150/96, pt asymptomatic at this time.

## 2020-09-10 LAB — GLUCOSE, CAPILLARY
Glucose-Capillary: 146 mg/dL — ABNORMAL HIGH (ref 70–99)
Glucose-Capillary: 122 mg/dL — ABNORMAL HIGH (ref 70–99)
Glucose-Capillary: 135 mg/dL — ABNORMAL HIGH (ref 70–99)
Glucose-Capillary: 193 mg/dL — ABNORMAL HIGH (ref 70–99)

## 2020-09-10 LAB — CK TOTAL AND CKMB (NOT AT ARMC)
CK, MB: 1.1 ng/mL (ref 0.5–5.0)
Relative Index: INVALID (ref 0.0–2.5)
Total CK: 19 U/L — ABNORMAL LOW (ref 38–234)

## 2020-09-10 LAB — TROPONIN I (HIGH SENSITIVITY): Troponin I (High Sensitivity): 5 ng/L (ref <18)

## 2020-09-10 MED ORDER — QUETIAPINE FUMARATE 50 MG PO TABS
50.0000 mg | ORAL_TABLET | Freq: Two times a day (BID) | ORAL | Status: DC
  Administered 2020-09-10 – 2020-09-24 (×28): 50 mg via ORAL
  Filled 2020-09-10 (×28): qty 1

## 2020-09-10 MED ORDER — CLONIDINE HCL 0.1 MG PO TABS
0.1000 mg | ORAL_TABLET | Freq: Once | ORAL | Status: AC
  Administered 2020-09-10: 0.1 mg via ORAL
  Filled 2020-09-10: qty 1

## 2020-09-10 MED ORDER — DEXAMETHASONE 2 MG PO TABS
2.0000 mg | ORAL_TABLET | Freq: Two times a day (BID) | ORAL | Status: DC
  Administered 2020-09-10 – 2020-09-12 (×5): 2 mg via ORAL
  Filled 2020-09-10 (×5): qty 1

## 2020-09-10 MED ORDER — SORBITOL 70 % SOLN
60.0000 mL | Status: AC
  Administered 2020-09-10: 60 mL via ORAL
  Filled 2020-09-10: qty 60

## 2020-09-10 NOTE — Progress Notes (Signed)
PROGRESS NOTE   Subjective/Complaints: Woke up with CP this morning. Denies sob. Tells me she had a pretty good day with therapy yesterday. Increase in gabapentin helped left sided pain. Received xanax for anxiety which helped chest pain too  ROS: Patient denies fever, rash, sore throat, blurred vision, nausea, vomiting, diarrhea, cough, shortness of breath .    Objective:   No results found. Recent Labs    09/08/20 0521  WBC 20.6*  HGB 14.3  HCT 42.6  PLT 468*   Recent Labs    09/08/20 0521  NA 137  K 4.2  CL 94*  CO2 32  GLUCOSE 138*  BUN 25*  CREATININE 0.85  CALCIUM 10.7*    Intake/Output Summary (Last 24 hours) at 09/10/2020 1414 Last data filed at 09/10/2020 0742 Gross per 24 hour  Intake 480 ml  Output --  Net 480 ml        Physical Exam: Vital Signs Blood pressure 94/63, pulse (!) 55, temperature 98.5 F (36.9 C), temperature source Oral, resp. rate 17, height '5\' 2"'$  (1.575 m), weight 70.4 kg, SpO2 98 %.  Constitutional: No distress . Vital signs reviewed. HEENT: NCAT, EOMI, oral membranes moist Neck: supple Cardiovascular: RRR without murmur. No JVD    Respiratory/Chest: CTA Bilaterally without wheezes or rales. Normal effort    GI/Abdomen: BS +, non-tender, non-distended Ext: no clubbing, cyanosis, or edema Psych: only slightly anxious, cooperative today Skin: right crani incision CDI. Right cheek pressure injury with foam dressing Neuro:  decreased hearing right ear.. Sensory exam decreased to LT along right peri-oral area, left face? And left arm and leg  moves all 4's.--neuro exam stable Musculoskeletal: Full ROM, No pain with AROM or PROM in the neck, trunk, or extremities. Posture appropriate. Palpation of sternum reproduces chest pain    Assessment/Plan: 1. Functional deficits which require 3+ hours per day of interdisciplinary therapy in a comprehensive inpatient rehab  setting. Physiatrist is providing close team supervision and 24 hour management of active medical problems listed below. Physiatrist and rehab team continue to assess barriers to discharge/monitor patient progress toward functional and medical goals  Care Tool:  Bathing    Body parts bathed by patient: Right arm, Left arm         Bathing assist Assist Level: Minimal Assistance - Patient > 75%     Upper Body Dressing/Undressing Upper body dressing   What is the patient wearing?: Pull over shirt    Upper body assist Assist Level: Minimal Assistance - Patient > 75%    Lower Body Dressing/Undressing Lower body dressing      What is the patient wearing?: Underwear/pull up     Lower body assist Assist for lower body dressing: Minimal Assistance - Patient > 75%     Toileting Toileting    Toileting assist Assist for toileting: Contact Guard/Touching assist     Transfers Chair/bed transfer  Transfers assist     Chair/bed transfer assist level: Minimal Assistance - Patient > 75%     Locomotion Ambulation   Ambulation assist      Assist level: Minimal Assistance - Patient > 75% Assistive device: No Device Max distance: 100'   Walk  10 feet activity   Assist     Assist level: Minimal Assistance - Patient > 75%     Walk 50 feet activity   Assist    Assist level: Minimal Assistance - Patient > 75%      Walk 150 feet activity   Assist Walk 150 feet activity did not occur: Safety/medical concerns         Walk 10 feet on uneven surface  activity   Assist     Assist level: Minimal Assistance - Patient > 75% Assistive device:  (R hand rail)   Wheelchair     Assist Will patient use wheelchair at discharge?: No             Wheelchair 50 feet with 2 turns activity    Assist            Wheelchair 150 feet activity     Assist          Blood pressure 94/63, pulse (!) 55, temperature 98.5 F (36.9 C), temperature  source Oral, resp. rate 17, height '5\' 2"'$  (1.575 m), weight 70.4 kg, SpO2 98 %. Medical Problem List and Plan: 1.  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  Antithrombotics: -DVT/anticoagulation:  Pharmaceutical: Lovenox             -antiplatelet therapy: N/A 3. Headaches/Pain Management:  Continue hydrocodone prn--  -increased gabapentin for neuropathic pain to 300 mg tid with improvement. -chest pain is musculoskeletal. Ekg without acute change and enzymes WNL.  4. Mood: Team to provide ego support. LCSW to follow for evaluation and support.              -antipsychotic agents:  seroquel 5. Neuropsych: This patient is capable of making decisions on her own behalf. 6. Skin/Wound Care: Monitor incision for healing.   -keep facial wound clean  7. Fluids/Electrolytes/Nutrition: BUN up  -encourage fluids 8. HTN: Monitor BP tid--continue Norvasc and Maxizide -borderline, behavior not helping, 9. T2DM: Hgb A1c-             --monitor BS ac/hs and use SSI for elevated BS. 10. H/o bipolar d/o: Continue schedule Prozac and Seroquel. --Continue Xanax bid prn w/ atarax prn.  -pt needs ego support -would benefit from neuropsych visit. Discussed importance of maintaining a stable mood as it relates to pain, neuro deficits, and mental health -gabapentin as above. May need to titrate further 11. Constipation: On Linzess. scheduled miralax               --she does not want any liquid laxatives-- will try sorbitol today if she's willing  -still no bm  -dulcolax supp prn, enema prn 12. OSA: Has been compliant with CPAP 13. Chronic cough: For past 3-4 months. scheduled Mucinex DM.             --pulmonary hygiene with flutter valve. 14. Cerebral edema: On decadron 4 mg every 8 hours-->could be exacerbating mood.               -tapered decadron to '2mg'$   q12      LOS:g  3 days A FACE TO FACE EVALUATION WAS PERFORMED  Meredith Staggers 09/10/2020, 2:14 PM

## 2020-09-10 NOTE — Progress Notes (Signed)
Called d/t pt having suicidal ideations, continued labile behavior. Sitter ordered. Add bid daytime seroquel ( in addition to HS dose). Gabapentin increased yesterday. Will ask psychiatry for input also.   Meredith Staggers, MD, [facility] 09/10/2020

## 2020-09-10 NOTE — Progress Notes (Signed)
Pt noted to be tearful during hs assessment, reports that she had a bad day and "just felt sad, I want to go home". Pt reports feeling better after speaking with family. PRN xanax and norco x2 given for c/o headache and right ear pain-part effects noted. Pt compliant with CPAP tonight.

## 2020-09-10 NOTE — IPOC Note (Signed)
Overall Plan of Care [facility]) Patient Details Name: Lindsay Rivera MRN: [id number] DOB: [birth year]  Admitting Diagnosis: Meningioma [facility])  Hospital Problems: Principal Problem:   Meningioma [facility]) Active Problems:   S/P resection of meningioma     Functional Problem List: Nursing Behavior, Endurance, Medication Management, Pain, Safety, Skin Integrity, Bowel  PT Balance, Behavior, Endurance, Motor, Pain, Safety, Sensory  OT Balance, Behavior, Edema, Endurance, Motor, Perception, Pain, Nutrition, Safety, Sensory, Skin Integrity, Vision  SLP    TR         Basic ADL's: OT Bathing, Eating, Grooming, Dressing, Toileting     Advanced  ADL's: OT       Transfers: PT Bed Mobility, Bed to Chair, Car, Manufacturing systems engineer, Metallurgist: PT Ambulation, Stairs     Additional Impairments: OT Fuctional Use of Upper Extremity  SLP        TR      Anticipated Outcomes Item Anticipated Outcome  Self Feeding Independent  Swallowing      Basic self-care  supervision  Toileting  supervision   Bathroom Transfers supervision  Bowel/Bladder  supervision  Transfers  Supervision  Locomotion  Supervision  Communication     Cognition     Pain  < 4  Safety/Judgment  supervision and no falls   Therapy Plan: PT Intensity: Minimum of 1-2 x/day ,45 to 90 minutes PT Frequency: 5 out of 7 days PT Duration Estimated Length of Stay: 10-12 days OT Intensity: Minimum of 1-2 x/day, 45 to 90 minutes OT Frequency: 5 out of 7 days OT Duration/Estimated Length of Stay: 10-12 days     Due to the current state of emergency, patients may not be receiving their 3-hours of Medicare-mandated therapy.   Team Interventions: Nursing Interventions Patient/Family Education, Bowel Management, Disease Management/Prevention, Pain Management, Medication Management, Skin Care/Wound Management, Discharge Planning  PT interventions Ambulation/gait training, Community reintegration,  DME/adaptive equipment instruction, Neuromuscular re-education, Psychosocial support, Stair training, UE/LE Strength taining/ROM, Training and development officer, Discharge planning, Functional electrical stimulation, Pain management, Therapeutic Activities, UE/LE Coordination activities, Cognitive remediation/compensation, Disease management/prevention, Functional mobility training, Patient/family education, Therapeutic Exercise  OT Interventions Balance/vestibular training, Cognitive remediation/compensation, Community reintegration, Discharge planning, Disease mangement/prevention, DME/adaptive equipment instruction, Functional electrical stimulation, Functional mobility training, Neuromuscular re-education, Pain management, Patient/family education, Psychosocial support, Self Care/advanced ADL retraining, Skin care/wound managment, Splinting/orthotics, UE/LE Strength taining/ROM, UE/LE Coordination activities, Therapeutic Activities, Therapeutic Exercise  SLP Interventions    TR Interventions    SW/CM Interventions Psychosocial Support, Discharge Planning, Patient/Family Education   Barriers to Discharge MD  Medical stability and Behavior  Nursing Decreased caregiver support, Home environment access/layout, Wound Care, Lack of/limited family support, Weight, Medication compliance, Behavior Discharging to son's home until able to care for herself. Son works but girlfriend can provide assist. Assist available 24/7.  PT      OT      SLP      SW       Team Discharge Planning: Destination: PT-Home ,OT- Home , SLP-  Projected Follow-up: PT-Outpatient PT, OT-   , SLP-  Projected Equipment Needs: PT-To be determined, OT- To be determined (Likely a tub transfer bench), SLP-  Equipment Details: PT- , OT-possibly a tub transfer bench Patient/family involved in discharge planning: PT- Patient,  OT-Patient, SLP-   MD ELOS: 10-12 days Medical Rehab Prognosis:  Good Assessment: The patient has been  admitted for CIR therapies with the diagnosis of mengioma s/p resection. The team will be addressing functional mobility, strength, stamina,  balance, safety, adaptive techniques and equipment, self-care, bowel and bladder mgt, patient and caregiver education, NMR, pain mgt, cognitive-behavior. Goals have been set at supervision.   Due to the current state of emergency, patients may not be receiving their 3 hours per day of Medicare-mandated therapy.    Meredith Staggers, MD, FAAPMR     See Team Conference Notes for weekly updates to the plan of care

## 2020-09-10 NOTE — Care Management (Signed)
Inpatient Branch Individual Statement of Services  Patient Name:  Lindsay Rivera  Date:  09/10/2020  Welcome to the [place].  Our goal is to provide you with an individualized program based on your diagnosis and situation, designed to meet your specific needs.  With this comprehensive rehabilitation program, you will be expected to participate in at least 3 hours of rehabilitation therapies Monday-Friday, with modified therapy programming on the weekends.  Your rehabilitation program will include the following services:  Physical Therapy (PT), Occupational Therapy (OT), Speech Therapy (ST), 24 hour per day rehabilitation nursing, Therapeutic Recreaction (TR), Psychology, Neuropsychology, Care Coordinator, Rehabilitation Medicine, [place], and Other  Weekly team conferences will be held on Tuesdays to discuss your progress.  Your Inpatient Rehabilitation Care Coordinator will talk with you frequently to get your input and to update you on team discussions.  Team conferences with you and your family in attendance may also be held.  Expected length of stay: 10-12 days    Overall anticipated outcome: Supervision  Depending on your progress and recovery, your program may change. Your Inpatient Rehabilitation Care Coordinator will coordinate services and will keep you informed of any changes. Your Inpatient Rehabilitation Care Coordinator's name and contact numbers are listed  below.  The following services may also be recommended but are not provided by the [place] will be made to provide these services after discharge if needed.  Arrangements include referral to agencies that provide these services.  Your insurance has been verified to be:  [facility] Medicare  Your primary  doctor is:  Fredrich Romans  Pertinent information will be shared with your doctor and your insurance company.  Inpatient Rehabilitation Care Coordinator:  Cathleen Corti [phone] or ([phone]  Information discussed with and copy given to patient by: [name], 09/10/2020, 1:58 PM

## 2020-09-10 NOTE — Progress Notes (Signed)
Pt noted to be tearful in room, reports mid sharp chest pain, non-radiating. Denies shortness of breath. V/S-T-98.5, HR 60, Resps 17, BP-166/110, O2 sats 100% on room air. Dr. Naaman Plummer notified, new orders noted and carried out. EKG performed and results relayed to Dr. Naaman Plummer, phlebotomy on unit at present to draw STAT cardiac enzymes. Primary nurse, Clifton James, made aware.

## 2020-09-10 NOTE — Progress Notes (Signed)
Occupational Therapy Note  Patient Details  Name: Lindsay Rivera MRN: [id number] Date of Birth: 1960/12/20  Today's Date: 09/10/2020 OT Missed Time: 60 Minutes Missed Time Reason: Pain  OT attempted to see patient 2x today for OT treatment session. On first attempt at 10:00 this am, pt reporting chest pain/chest tightness as well as headache. Pt declined to participate. OT returned at 13:00 today and patient reporting her ear and her head hurts. Pt declined to participate due to this pain despite encouragement. Pt left semi-reclined in bed with needs met.    Daneen Schick [facility] 09/10/2020, 1:14 PM

## 2020-09-10 NOTE — Progress Notes (Signed)
Patient ID: Lindsay Rivera, female   DOB: 1960/06/06, 60 y.o.   MRN: [id number]   SW met with pt in room to provide statement of service. During visit, pt became very  tearful as she discussed various concerns. Pt mentioned that she really felt her medications needed to be adjusted. She reported that she has "never felt like this where she wants to harm self." SW asked pt did she want to harm her self, she shook her head as she continued to cry in room. SW asked pt if she would be ok with SW leaving the room while trying to address her concerns. Pt shook her head yes in agreement.   SW left room. SW informed medical team on pt requiring suicide watch after recent report. SW spoke with attending Dr. Naaman Plummer to request psych consult. Nursing team working on a safety plan for patient.   Loralee Pacas, MSW, [place] Office: [phone] Cell: [phone] Fax: [phone]

## 2020-09-10 NOTE — Progress Notes (Signed)
Physical Therapy Session Note  Patient Details  Name: ORNA LUSCH MRN: [id number] Date of Birth: 07/17/1960  Today's Date: 09/10/2020 PT Missed Time: 45 Minutes Missed Time Reason: Nursing care   Per Clifton James, RN pt experiencing suicidal ideations and requests to hold therapy at this time. Missed 45 minutes of skilled physical therapy.  Tawana Scale , PT, DPT, NCS, CSRS 09/10/2020, 3:13 PM

## 2020-09-11 DIAGNOSIS — F3162 Bipolar disorder, current episode mixed, moderate: Secondary | ICD-10-CM

## 2020-09-11 LAB — GLUCOSE, CAPILLARY
Glucose-Capillary: 169 mg/dL — ABNORMAL HIGH (ref 70–99)
Glucose-Capillary: 147 mg/dL — ABNORMAL HIGH (ref 70–99)
Glucose-Capillary: 170 mg/dL — ABNORMAL HIGH (ref 70–99)
Glucose-Capillary: 184 mg/dL — ABNORMAL HIGH (ref 70–99)

## 2020-09-11 NOTE — Progress Notes (Signed)
Slept well throughout the night, compliant with CPAP most of the night. Norco given x2-effective for reports of headache. Xanax given prn for anxiety-partially effective. Pt continues with periods of mood lability, can be easily agitated however compliant with direction of staff. Denies suicidal ideation. Safety sitter at bedside.

## 2020-09-11 NOTE — Progress Notes (Signed)
Physical Therapy Session Note  Patient Details  Name: Lindsay Rivera MRN: [id number] Date of Birth: [birth year]  Today's Date: 09/11/2020 PT Missed Time: 57 Minutes Missed Time Reason: Patient unwilling to participate  Short Term Goals: Week 1:  PT Short Term Goal 1 (Week 1): Pt will perform bed to chair transfer with CGA. PT Short Term Goal 2 (Week 1): Pt will ambulate x150' with CGA. PT Short Term Goal 3 (Week 1): Pt will complete x4 steps with BHRs and CGA.  Skilled Therapeutic Interventions/Progress Updates:     Pt politely refuses PT due to "feeling terrible and depressed". PT educates on need for participation moving forward and pt verbalizes understanding. PT will follow up as able.  Therapy Documentation Precautions:  Precautions Precautions: Fall Restrictions Weight Bearing Restrictions: No   Therapy/Group: Individual Therapy  Breck Coons, PT, DPT 09/11/2020, 5:26 PM

## 2020-09-11 NOTE — Progress Notes (Signed)
Occupational Therapy Session Note  Patient Details  Name: Lindsay Rivera MRN: [id number] Date of Birth: 14-Jun-1960  Today's Date: 09/11/2020 OT Individual Time: 1002-1100 OT Individual Time Calculation (min): 58 min    Short Term Goals: Week 1:  OT Short Term Goal 1 (Week 1): Petient will maintain standing balance with CGA within functional BADL task OT Short Term Goal 2 (Week 1): Pt will complete 2 steps of toileting OT Short Term Goal 3 (Week 1): Pt will use L UE at dimihsed level with min questioning cues.  Skilled Therapeutic Interventions/Progress Updates:    Pt greeted semi-reclined in bed with sitter present due to suicidal ideations yesterday. Pt denied being suicidal and pleasantly agreeable to OT treatment session focused on self-care retraining. Functional ambulation to the bathroom with min HHA and intermittent Mod A due to loss of balance. Pt sat on commode and voided bladder, min A for hygiene. Pt then ambulated to the shower and bathed from shower seat with cut out. Min A for balance when standing to wash buttocks. Worked on pt's awareness to L side of body in standing. Dressing from EOB with min A and increased time to thread pant legs.Standing balance/endurance with standing grooming tasks and CGA. Pt brought down to therapy gym in wc for continued therapy. Pt noted that all of her things were in the dayroom and not locked up like nursing from yesterday said they would be. Patient with emotional outburst, yelling and crying as she though some of her money and her bank card had been stolen. OT able to calm pt down to call her son who had her bank card bc he forgot to return it. Pt still feels like she is missing some money. Pt returned to room and left seated in wc with sitter present and needs met.   Therapy Documentation Precautions:  Precautions Precautions: Fall Restrictions Weight Bearing Restrictions: No Pain:  Pt reports headache, no number given. Rest and repositioned  for comfort.    Therapy/Group: Individual Therapy  Valma Cava 09/11/2020, 1:35 PM
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
## 2020-09-11 NOTE — Progress Notes (Signed)
PROGRESS NOTE   Subjective/Complaints: Pt appears to have slept pretty well last night. Used CPAP. Still some lability but not to the point where she was yesterday afternoon. Psychiatry in to see patient when I initially came to the room today.   ROS: Limited due to cognitive/behavioral     Objective:   No results found. No results for input(s): WBC, HGB, HCT, PLT in the last 72 hours.  No results for input(s): NA, K, CL, CO2, GLUCOSE, BUN, CREATININE, CALCIUM in the last 72 hours.   Intake/Output Summary (Last 24 hours) at 09/11/2020 1013 Last data filed at 09/11/2020 0900 Gross per 24 hour  Intake 960 ml  Output --  Net 960 ml        Physical Exam: Vital Signs Blood pressure 107/73, pulse (!) 55, temperature 98.3 F (36.8 C), temperature source Oral, resp. rate 18, height '5\' 2"'$  (1.575 m), weight 70.4 kg, SpO2 98 %.  Constitutional: No distress . Vital signs reviewed. HEENT: NCAT, EOMI, oral membranes moist Neck: supple Cardiovascular: RRR without murmur. No JVD    Respiratory/Chest: CTA Bilaterally without wheezes or rales. Normal effort    GI/Abdomen: BS +, non-tender, non-distended Ext: no clubbing, cyanosis, or edema Psych: anxious, less labile/irritable, cooperative Skin: right crani incision CDI. Right cheek pressure injury with foam dressing Neuro:  impaired hearing right ear.. Sensory exam decreased to LT along right peri-oral area, left face? And left arm and leg  moves all 4's.--neuro exam remains stable Musculoskeletal: Full ROM, No pain with AROM or PROM in the neck, trunk, or extremities. Posture appropriate. Sternal discomfort   Assessment/Plan: 1. Functional deficits which require 3+ hours per day of interdisciplinary therapy in a comprehensive inpatient rehab setting. Physiatrist is providing close team supervision and 24 hour management of active medical problems listed below. Physiatrist and rehab  team continue to assess barriers to discharge/monitor patient progress toward functional and medical goals  Care Tool:  Bathing    Body parts bathed by patient: Right arm, Left arm         Bathing assist Assist Level: Minimal Assistance - Patient > 75%     Upper Body Dressing/Undressing Upper body dressing   What is the patient wearing?: Pull over shirt    Upper body assist Assist Level: Minimal Assistance - Patient > 75%    Lower Body Dressing/Undressing Lower body dressing      What is the patient wearing?: Underwear/pull up     Lower body assist Assist for lower body dressing: Minimal Assistance - Patient > 75%     Toileting Toileting    Toileting assist Assist for toileting: Contact Guard/Touching assist     Transfers Chair/bed transfer  Transfers assist     Chair/bed transfer assist level: Minimal Assistance - Patient > 75%     Locomotion Ambulation   Ambulation assist      Assist level: Minimal Assistance - Patient > 75% Assistive device: No Device Max distance: 100'   Walk 10 feet activity   Assist     Assist level: Minimal Assistance - Patient > 75%     Walk 50 feet activity   Assist    Assist level: Minimal  Assistance - Patient > 75%      Walk 150 feet activity   Assist Walk 150 feet activity did not occur: Safety/medical concerns         Walk 10 feet on uneven surface  activity   Assist     Assist level: Minimal Assistance - Patient > 75% Assistive device:  (R hand rail)   Wheelchair     Assist Will patient use wheelchair at discharge?: No             Wheelchair 50 feet with 2 turns activity    Assist            Wheelchair 150 feet activity     Assist          Blood pressure 107/73, pulse (!) 55, temperature 98.3 F (36.8 C), temperature source Oral, resp. rate 18, height '5\' 2"'$  (1.575 m), weight 70.4 kg, SpO2 98 %. Medical Problem List and Plan: 1.  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  Antithrombotics: -DVT/anticoagulation:  Pharmaceutical: Lovenox             -antiplatelet therapy: N/A 3. Headaches/Pain Management:  Continue hydrocodone prn--  -increased gabapentin for neuropathic pain to 300 mg tid with improvement. -chest pain is musculoskeletal. Ekg without acute change and enzymes WNL.  4. Mood: Team to provide ego support. LCSW to follow for evaluation and support.              -antipsychotic agents:  seroquel 5. Neuropsych: This patient is capable of making decisions on her own behalf. 6. Skin/Wound Care: Monitor incision for healing.   -keep facial wound clean  7. Fluids/Electrolytes/Nutrition: BUN up  -encourage fluids 8. HTN: Monitor BP tid--continue Norvasc and Maxizide -borderline, behavior not helping, 9. T2DM: Hgb A1c-             --monitor BS ac/hs and use SSI for elevated BS. 10. H/o bipolar d/o and reported schizophrenia: suicidal ideations yesterday Continue scheduled Prozac  --Continue Xanax bid prn w/ atarax prn.  -pt needs ego support -seroquel '50mg'$  bid added to '100mg'$  qhs yesterday -gabapentin '300mg'$  tid -appreciate psychiatry input, counseling -neuropsychology input would also be helpful -sitter for now -weaning steroids 11. Constipation: On Linzess. scheduled miralax               --large bm's 8/4 after sorbitol  -dulcolax supp prn, enema prn 12. OSA: Has been compliant with CPAP 13. Chronic cough: For past 3-4 months. scheduled Mucinex DM.             --pulmonary hygiene with flutter valve. 14. Cerebral edema: On decadron 4 mg every 8 hours-->could be exacerbating mood.              -tapered decadron to '2mg'$   q12 8/4      LOS:g  4 days A FACE TO FACE EVALUATION WAS PERFORMED  Meredith Staggers 09/11/2020, 10:13 AM

## 2020-09-11 NOTE — Progress Notes (Signed)
Occupational Therapy Session Note  Patient Details  Name: Lindsay Rivera MRN: [id number] Date of Birth: 1960-08-31  Today's Date: 09/11/2020 OT Individual Time: 1300-1340 OT Individual Time Calculation (min): 40 min  and Today's Date: 09/11/2020 OT Missed Time: 20 Minutes Missed Time Reason: Patient fatigue   Short Term Goals: Week 1:  OT Short Term Goal 1 (Week 1): Petient will maintain standing balance with CGA within functional BADL task OT Short Term Goal 2 (Week 1): Pt will complete 2 steps of toileting OT Short Term Goal 3 (Week 1): Pt will use L UE at dimihsed level with min questioning cues.  Skilled Therapeutic Interventions/Progress Updates:    Pt received sitting in w/c with no c/o pain, sitter present. Pt was taken to the therapy gym via w/c. Min A stand pivot transfer to the mat from w/c. Pt completed standing level reciprocal tapping activity with alternating R and L LE. Min-mod A required with occasional buckling of the LLE but pt able to recover slightly herself. Second trial pt improved and required only min A. Pt then completed modified trunk flexion/extension with a 2 kg medicine ball with exaggerated forward reach. Rest breaks required frequently. Pt reported being lightheaded and headache, and requested to return to her room. She completed stand pivot back to w/ c with min A. Min A stand pivot transfer to toilet. Toileting tasks with CGA overall. Pt transferred back to supine and was left with all needs met, sitter present for safety.   Therapy Documentation Precautions:  Precautions Precautions: Fall Restrictions Weight Bearing Restrictions: No  Therapy/Group: Individual Therapy  Curtis Sites 09/11/2020, 6:14 AM
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
## 2020-09-12 LAB — GLUCOSE, CAPILLARY
Glucose-Capillary: 153 mg/dL — ABNORMAL HIGH (ref 70–99)
Glucose-Capillary: 118 mg/dL — ABNORMAL HIGH (ref 70–99)
Glucose-Capillary: 109 mg/dL — ABNORMAL HIGH (ref 70–99)
Glucose-Capillary: 194 mg/dL — ABNORMAL HIGH (ref 70–99)

## 2020-09-12 MED ORDER — CHLORHEXIDINE GLUCONATE CLOTH 2 % EX PADS: 6.0000 | MEDICATED_PAD | Freq: Every day | CUTANEOUS | Status: DC

## 2020-09-12 MED ORDER — DEXAMETHASONE 2 MG PO TABS
1.0000 mg | ORAL_TABLET | Freq: Two times a day (BID) | ORAL | Status: AC
  Administered 2020-09-12 – 2020-09-14 (×5): 1 mg via ORAL
  Filled 2020-09-12 (×5): qty 1

## 2020-09-12 MED ORDER — FLUOXETINE HCL 20 MG PO CAPS
80.0000 mg | ORAL_CAPSULE | Freq: Every morning | ORAL | Status: DC
  Administered 2020-09-13 – 2020-09-24 (×13): 80 mg via ORAL
  Filled 2020-09-12 (×13): qty 4

## 2020-09-12 MED ORDER — GABAPENTIN 400 MG PO CAPS
400.0000 mg | ORAL_CAPSULE | Freq: Three times a day (TID) | ORAL | Status: DC
  Administered 2020-09-12 – 2020-09-14 (×6): 400 mg via ORAL
  Filled 2020-09-12 (×6): qty 1

## 2020-09-12 NOTE — Progress Notes (Signed)
PROGRESS NOTE   Subjective/Complaints: Pt still feels numb/painful on left side, but it's better than what it was. Feels depressed. Mood less labile. Able to sleep. Right ear fullness tends to make her anxious.  ROS: Patient denies fever, rash, sore throat, blurred vision, nausea, vomiting, diarrhea, cough, shortness of breath or chest pain, joint or back pain, headache, or mood change.     Objective:   No results found. No results for input(s): WBC, HGB, HCT, PLT in the last 72 hours.  No results for input(s): NA, K, CL, CO2, GLUCOSE, BUN, CREATININE, CALCIUM in the last 72 hours.   Intake/Output Summary (Last 24 hours) at 09/12/2020 1138 Last data filed at 09/11/2020 2135 Gross per 24 hour  Intake 720 ml  Output --  Net 720 ml        Physical Exam: Vital Signs Blood pressure 109/80, pulse 67, temperature 98.1 F (36.7 C), temperature source Oral, resp. rate 14, height '5\' 2"'$  (1.575 m), weight 70.4 kg, SpO2 100 %.  Constitutional: No distress . Vital signs reviewed. HEENT: NCAT, EOMI, oral membranes moist Neck: supple Cardiovascular: RRR without murmur. No JVD    Respiratory/Chest: CTA Bilaterally without wheezes or rales. Normal effort    GI/Abdomen: BS +, non-tender, non-distended Ext: no clubbing, cyanosis, or edema Psych: pleasant, more calm today, less anxious. Non-irritable Skin: right crani incision CDI. With one proximal suture.  Right cheek pressure injury heeling.  Neuro:  impaired hearing right ear.. Sensory exam decreased to LT along right peri-oral area, left face? And left arm and leg---stable.   moves all 4's.--neuro exam remains stable Musculoskeletal: Full ROM, No pain with AROM or PROM in the neck, trunk, or extremities. Posture appropriate. Sternal discomfort   Assessment/Plan: 1. Functional deficits which require 3+ hours per day of interdisciplinary therapy in a comprehensive inpatient rehab  setting. Physiatrist is providing close team supervision and 24 hour management of active medical problems listed below. Physiatrist and rehab team continue to assess barriers to discharge/monitor patient progress toward functional and medical goals  Care Tool:  Bathing    Body parts bathed by patient: Right arm, Left arm, Chest, Abdomen, Front perineal area, Buttocks, Right upper leg, Left upper leg, Right lower leg, Left lower leg, Face         Bathing assist Assist Level: Supervision/Verbal cueing     Upper Body Dressing/Undressing Upper body dressing   What is the patient wearing?: Pull over shirt    Upper body assist Assist Level: Set up assist    Lower Body Dressing/Undressing Lower body dressing      What is the patient wearing?: Pants     Lower body assist Assist for lower body dressing: Contact Guard/Touching assist     Toileting Toileting    Toileting assist Assist for toileting: Contact Guard/Touching assist     Transfers Chair/bed transfer  Transfers assist     Chair/bed transfer assist level: Minimal Assistance - Patient > 75%     Locomotion Ambulation   Ambulation assist      Assist level: Minimal Assistance - Patient > 75% Assistive device: No Device Max distance: 100'   Walk 10 feet activity   Assist  Assist level: Minimal Assistance - Patient > 75%     Walk 50 feet activity   Assist    Assist level: Minimal Assistance - Patient > 75%      Walk 150 feet activity   Assist Walk 150 feet activity did not occur: Safety/medical concerns         Walk 10 feet on uneven surface  activity   Assist     Assist level: Maximal Assistance - Patient 25 - 49% Assistive device:  (R hand rail)   Wheelchair     Assist Will patient use wheelchair at discharge?: No             Wheelchair 50 feet with 2 turns activity    Assist            Wheelchair 150 feet activity     Assist           Blood pressure 109/80, pulse 67, temperature 98.1 F (36.7 C), temperature source Oral, resp. rate 14, height '5\' 2"'$  (1.575 m), weight 70.4 kg, SpO2 100 %. Medical Problem List and Plan: 1.  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  Antithrombotics: -DVT/anticoagulation:  Pharmaceutical: Lovenox             -antiplatelet therapy: N/A 3. Headaches/Pain Management:  Continue hydrocodone prn--  -increased gabapentin for neuropathic pain to 400 mg tid   -behavioral mod.  4. Mood: Team to provide ego support. LCSW to follow for evaluation and support.              -antipsychotic agents:  seroquel 5. Neuropsych: This patient is capable of making decisions on her own behalf. 6. Skin/Wound Care: Monitor incision for healing.   -keep facial wound clean  7. Fluids/Electrolytes/Nutrition: BUN up  -encourage fluids 8. HTN: Monitor BP tid--continue Norvasc and Maxizide -borderline, behavior not helping, 9. T2DM: Hgb A1c-             --monitor BS ac/hs and use SSI for elevated BS. 10. H/o bipolar d/o and reported schizophrenia: suicidal ideations yesterday  -improved today -increase Prozac to '80mg'$ --d'ed/w pt -Continue Xanax bid prn w/ atarax prn.  -pt needs ego support -seroquel '50mg'$  bid added to '100mg'$  qhs yesterday -gabapentin '300mg'$  tid---increase to '400mg'$  tid -appreciate psychiatry input, counseling -neuropsychology input would also be helpful -sitter no longer needed.  -weaning steroids(I think this was a major factor) 11. Constipation: On Linzess. scheduled miralax               --large bm's 8/4 after sorbitol  -dulcolax supp prn, enema prn 12. OSA: Has been compliant with CPAP 13. Chronic cough: For past 3-4 months. scheduled Mucinex DM.             --pulmonary hygiene with flutter valve. 14.  Cerebral edema: tapering decadron to '1mg'$  q12      LOS:g  5 days A FACE TO FACE EVALUATION WAS PERFORMED  Meredith Staggers 09/12/2020, 11:38 AM

## 2020-09-12 NOTE — Progress Notes (Signed)
Occupational Therapy Session Note  Patient Details  Name: Lindsay Rivera MRN: [id number] Date of Birth: [birth year]  Today's Date: 09/12/2020 OT Individual Time: 0930-1005 and 1305- 1400 OT Individual Time Calculation (min): 35 min and 55 min    Short Term Goals: Week 1:  OT Short Term Goal 1 (Week 1): Petient will maintain standing balance with CGA within functional BADL task OT Short Term Goal 2 (Week 1): Pt will complete 2 steps of toileting OT Short Term Goal 3 (Week 1): Pt will use L UE at dimihsed level with min questioning cues.  Skilled Therapeutic Interventions/Progress Updates:    Visit 1: Pain: pt provided with pain medication at start of therapy session for LLE    Pt seen for BADL retraining of toileting, bathing, and dressing with a focus on moving slowly and cautiously as she tends to jump up and move too quickly before ensuring her L leg is stable.  Ambulated from door of bathroom to toilet and then to shower with min A.  She did very well with self care accomplishing all tasks at a close S to CGA level. Pt in excellent spirits today and very motivated. Pt in room with NT as he was finishing remaking her bed.   Visit 2: Pain: no c/o pain  Pt received in bed agreeable to therapy.  Pt was able to transfer bed to w/c with CGA using RW.  Pt taken to ADL apartment to practice tub bench transfers with CGA with RW.  Pt stated her son got a [employer] shower for her.  She only has a shower seat so she will need a TTB as stepping over the tub wall is not safe with her LLE.  Discussed use of clamp on grab bars and how to place shower curtain to avoid water spillage.  Spent time talking about her feelings the last few days and what she experienced. Pt feels strongly that it is just time to move on and focus on her rehab. Pt self propelled wc to ortho gym demonstrating good use of LUE.  Transferred to mat to focus on forced use of LLE in standing. Had pt place R foot on block so she had increased wt  bearing through L leg. Pt worked on squats 8 x 3 sets with pt exerting a great deal to keep control of her L knee which she did very well.   Pt returned to room and opted to rest in bed before her next session.  Bed alarm on and all needs met.  Therapy Documentation Precautions:  Precautions Precautions: Fall Restrictions Weight Bearing Restrictions: No   ADL: ADL Eating: Set up Grooming: Modified independent Upper Body Bathing: Supervision/safety Where Assessed-Upper Body Bathing: Shower Lower Body Bathing: Supervision/safety Where Assessed-Lower Body Bathing: Shower Upper Body Dressing: Supervision/safety Lower Body Dressing: Contact guard Toileting: Contact guard Toilet Transfer: Minimal assistance Toilet Transfer Method: Counselling psychologist: Emergency planning/management officer Transfer: Minimal assistance Social research officer, government Method: Heritage manager: Grab bars, Transfer tub bench  Therapy/Group: Individual Therapy  [place] 09/12/2020, 10:30 AM

## 2020-09-12 NOTE — Plan of Care (Signed)
  Problem: Consults Goal: RH BRAIN INJURY PATIENT EDUCATION Description: Description: See Patient Education module for eduction specifics Outcome: Progressing Goal: Skin Care Protocol Initiated - if Braden Score 18 or less Description: If consults are not indicated, leave blank or document N/A Outcome: Progressing Goal: Diabetes Guidelines if Diabetic/Glucose > 140 Description: If diabetic or lab glucose is > 140 mg/dl - Initiate Diabetes/Hyperglycemia Guidelines & Document Interventions  Outcome: Progressing   Problem: RH BOWEL ELIMINATION Goal: RH STG MANAGE BOWEL WITH ASSISTANCE Description: STG Manage Bowel with Supervision Assistance. Outcome: Progressing   Problem: RH SKIN INTEGRITY Goal: RH STG ABLE TO PERFORM INCISION/WOUND CARE W/ASSISTANCE Description: STG Able To Perform Incision/Wound Care With Supervision Assistance. Outcome: Progressing   Problem: RH SAFETY Goal: RH STG ADHERE TO SAFETY PRECAUTIONS W/ASSISTANCE/DEVICE Description: STG Adhere to Safety Precautions With Cues and Reminders. Outcome: Progressing   Problem: RH COGNITION-NURSING Goal: RH STG USES MEMORY AIDS/STRATEGIES W/ASSIST TO PROBLEM SOLVE Description: STG Uses Memory Aids/Strategies With Cues and Reminders to Problem Solve. Outcome: Progressing   Problem: RH PAIN MANAGEMENT Goal: RH STG PAIN MANAGED AT OR BELOW PT'S PAIN GOAL Description: < 4 on a 0-10 pain scale. Outcome: Progressing   Problem: RH KNOWLEDGE DEFICIT BRAIN INJURY Goal: RH STG INCREASE KNOWLEDGE OF SELF CARE AFTER BRAIN INJURY Description: Patient will demonstrate knowledge of medication management, pain management, skin/wound care with educational materials and handouts provided by staff independently at discharge. Outcome: Progressing

## 2020-09-12 NOTE — Progress Notes (Signed)
Patient one suture to left side of head was removed as per MD verbal orders. No signs and symptoms of bleeding. Sanda Linger, LPN

## 2020-09-12 NOTE — Progress Notes (Addendum)
Physical Therapy Session Note  Patient Details  Name: Lindsay Rivera MRN: [id number] Date of Birth: 1960/06/22  Today's Date: 09/12/2020 PT Individual Time: 1105-1205 PT Individual Time Calculation (min): 60 min   Pt missed 45 min PM tx.   Short Term Goals: Week 1:  PT Short Term Goal 1 (Week 1): Pt will perform bed to chair transfer with CGA. PT Short Term Goal 2 (Week 1): Pt will ambulate x150' with CGA. PT Short Term Goal 3 (Week 1): Pt will complete x4 steps with BHRs and CGA.  Skilled Therapeutic Interventions/Progress Updates:   Tx 1:  Pt resting in bed.  She reported a slight HA, premedicated.  With [facility] raised, supine> sit with supervision.    Transfer training for sit to stand: cues for scooting forward, placing feet, trunk flexion before standing , with min assist.  Mod assist for static standing due to L knee buckling.  AD had been removed from room due to previous suicide watch. Pt very fearful of falling.  Emotional support provided.   PT got permission from [facility], RN for pt to go outside.  (Suicide watch has been lifted).  neuromuscular re-education via mutlimodal cues , visual feedback, and forced use, for:   Standing- L knee flexion/ext , with mod assist. Sitting- bil ankle pumps with 2 second holds, L hand thumb> fingers sequentially with eyes open/closed with improvements in speed and motor control with practice.  Wc propulsion outdoors on uneven terrain, using bil UEs for LUE, x 50' with mod assist for steering.  PT applied ACE to L knee for pt confidence, and increased sensation in standing. Gait training outdoors on uneven pavers x 60' with turns, SW,CGA, ACE on L knee. Mod cues for longer R step length, and not stepping past RW.  Pt requested getting back in bed.  Squat pivot to R wc> bed with CGA; ACE on L knee had been doffed.   At end of session, pt sit> supine in bed iwht supervision.  Bed alarm set; pt in care of [place], [place].  Tx 2:  Pt resting in bed.   Pt's spouse in the room, visiting from [facility].  Pt politely declined participating, stating that they had not seen each other since June 12.  Pt stated that she "would start fresh tomorrow."  PT offered to come back in 30 minutes for session, but pt declined this.     Therapy Documentation Precautions:  Precautions Precautions: Fall Restrictions Weight Bearing Restrictions: No       Therapy/Group: Individual Therapy  Alyiah Ulloa 09/12/2020, 3:35 PM

## 2020-09-12 NOTE — Progress Notes (Signed)
Patient slept well throughout the night. Compliant with CPAP throughout the night. PRN hydrocodone given x2 this shift for c/o headache and left leg pain this am. Will monitor for effects. PRN xanax given at hs for anxiety-effective. Pt pleasant and cooperative throughout the night. No SI noted.

## 2020-09-13 LAB — GLUCOSE, CAPILLARY
Glucose-Capillary: 118 mg/dL — ABNORMAL HIGH (ref 70–99)
Glucose-Capillary: 133 mg/dL — ABNORMAL HIGH (ref 70–99)
Glucose-Capillary: 127 mg/dL — ABNORMAL HIGH (ref 70–99)
Glucose-Capillary: 126 mg/dL — ABNORMAL HIGH (ref 70–99)

## 2020-09-13 MED ORDER — DEXAMETHASONE 2 MG PO TABS
1.0000 mg | ORAL_TABLET | Freq: Every day | ORAL | Status: AC
  Administered 2020-09-15 – 2020-09-17 (×3): 1 mg via ORAL
  Filled 2020-09-13 (×3): qty 1

## 2020-09-13 NOTE — Progress Notes (Signed)
Patient given shower by nurse. During shower patient stated that she takes a pill everyday for herpes virus. She fills as if she will have a breakout. Sanda Linger, LPN

## 2020-09-13 NOTE — Progress Notes (Signed)
PROGRESS NOTE   Subjective/Complaints: Pt in better spirits this morning. Has intermittent pain in right ear and left side but generally it has been better. Suture taken out of right scalp.   ROS: Patient denies fever, rash, sore throat, blurred vision, nausea, vomiting, diarrhea, cough, shortness of breath or chest pain,      Objective:   No results found. No results for input(s): WBC, HGB, HCT, PLT in the last 72 hours.  No results for input(s): NA, K, CL, CO2, GLUCOSE, BUN, CREATININE, CALCIUM in the last 72 hours.   Intake/Output Summary (Last 24 hours) at 09/13/2020 0959 Last data filed at 09/12/2020 2000 Gross per 24 hour  Intake 240 ml  Output --  Net 240 ml        Physical Exam: Vital Signs Blood pressure 121/72, pulse (!) 54, temperature 98 F (36.7 C), temperature source Oral, resp. rate 17, height '5\' 2"'$  (1.575 m), weight 70.4 kg, SpO2 99 %.  Constitutional: No distress . Vital signs reviewed. HEENT: NCAT, EOMI, oral membranes moist Neck: supple Cardiovascular: RRR without murmur. No JVD    Respiratory/Chest: CTA Bilaterally without wheezes or rales. Normal effort    GI/Abdomen: BS +, non-tender, non-distended Ext: no clubbing, cyanosis, or edema Psych: pleasant and a little anxious but  much improved Skin: right crani incision CDI. With one proximal suture which has been removed.  Right cheek pressure injury heeling.  Neuro:  impaired hearing right ear.. Sensory exam decreased to LT along right peri-oral area, left face? And left arm and leg---stable.   moves all 4's.--neuro exam remains stable Musculoskeletal: Full ROM, No pain with AROM or PROM in the neck, trunk, or extremities. Posture appropriate. Sternal discomfort better   Assessment/Plan: 1. Functional deficits which require 3+ hours per day of interdisciplinary therapy in a comprehensive inpatient rehab setting. Physiatrist is providing close team  supervision and 24 hour management of active medical problems listed below. Physiatrist and rehab team continue to assess barriers to discharge/monitor patient progress toward functional and medical goals  Care Tool:  Bathing    Body parts bathed by patient: Right arm, Left arm, Chest, Abdomen, Front perineal area, Buttocks, Right upper leg, Left upper leg, Right lower leg, Left lower leg, Face         Bathing assist Assist Level: Supervision/Verbal cueing     Upper Body Dressing/Undressing Upper body dressing   What is the patient wearing?: Pull over shirt    Upper body assist Assist Level: Set up assist    Lower Body Dressing/Undressing Lower body dressing      What is the patient wearing?: Pants     Lower body assist Assist for lower body dressing: Contact Guard/Touching assist     Toileting Toileting    Toileting assist Assist for toileting: Contact Guard/Touching assist     Transfers Chair/bed transfer  Transfers assist     Chair/bed transfer assist level: Moderate Assistance - Patient 50 - 74%     Locomotion Ambulation   Ambulation assist      Assist level: Contact Guard/Touching assist Assistive device: Walker-standard Max distance: 60   Walk 10 feet activity   Assist  Assist level: Minimal Assistance - Patient > 75% Assistive device: Walker-standard   Walk 50 feet activity   Assist    Assist level: Contact Guard/Touching assist Assistive device: Walker-standard    Walk 150 feet activity   Assist Walk 150 feet activity did not occur: Safety/medical concerns         Walk 10 feet on uneven surface  activity   Assist     Assist level: Maximal Assistance - Patient 25 - 49% Assistive device:  (R hand rail)   Wheelchair     Assist Will patient use wheelchair at discharge?: No Type of Wheelchair: Manual    Wheelchair assist level: Moderate Assistance - Patient 50 - 74% Max wheelchair distance: 50     Wheelchair 50 feet with 2 turns activity    Assist        Assist Level: Moderate Assistance - Patient 50 - 74%   Wheelchair 150 feet activity     Assist          Blood pressure 121/72, pulse (!) 54, temperature 98 F (36.7 C), temperature source Oral, resp. rate 17, height '5\' 2"'$  (1.575 m), weight 70.4 kg, SpO2 99 %. Medical Problem List and Plan: 1.  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  Antithrombotics: -DVT/anticoagulation:  Pharmaceutical: Lovenox             -antiplatelet therapy: N/A 3. Headaches/Pain Management:  Continue hydrocodone prn--  -continue gabapentin for neuropathic pain to 400 mg tid   -behavioral mod.  4. Mood: Team to provide ego support. LCSW to follow for evaluation and support.              -antipsychotic agents:  seroquel 5. Neuropsych: This patient is capable of making decisions on her own behalf. 6. Skin/Wound Care: Monitor incision for healing.   -keep facial wound clean  7. Fluids/Electrolytes/Nutrition: BUN up  -encourage fluids 8. HTN: Monitor BP tid--continue Norvasc and Maxizide -borderline, behavior not helping, 9. T2DM: Hgb A1c-             --monitor BS ac/hs and use SSI for elevated BS. 10. H/o bipolar d/o and reported schizophrenia: suicidal ideations Thursday  -improving -increased Prozac to '80mg'$  8/6 -Continue Xanax bid prn w/ atarax prn.  -pt needs ego support -seroquel '50mg'$  bid added to '100mg'$  qhs   -gabapentin '300mg'$  tid---increased to '400mg'$  tid 8/6 -appreciate psychiatry input, counseling -neuropsychology input would also be helpful -sitter no longer needed.  -weaning steroids to off(I think this was a major factor) 11. Constipation: On Linzess. scheduled miralax               --large bm's 8/4 after sorbitol---needs bm today  8/7  -dulcolax supp prn, enema prn 12. OSA: Has been compliant with CPAP 13. Chronic cough: For past 3-4 months. scheduled Mucinex DM.             --pulmonary hygiene with flutter valve. 14. Cerebral edema: tapering decadron to '1mg'$  q12      LOS:g  6 days A FACE TO FACE EVALUATION WAS PERFORMED  Meredith Staggers 09/13/2020, 9:59 AM

## 2020-09-13 NOTE — Plan of Care (Signed)
  Problem: Consults Goal: RH BRAIN INJURY PATIENT EDUCATION Description: Description: See Patient Education module for eduction specifics Outcome: Progressing Goal: Skin Care Protocol Initiated - if Braden Score 18 or less Description: If consults are not indicated, leave blank or document N/A Outcome: Progressing Goal: Diabetes Guidelines if Diabetic/Glucose > 140 Description: If diabetic or lab glucose is > 140 mg/dl - Initiate Diabetes/Hyperglycemia Guidelines & Document Interventions  Outcome: Progressing   Problem: RH BOWEL ELIMINATION Goal: RH STG MANAGE BOWEL WITH ASSISTANCE Description: STG Manage Bowel with Supervision Assistance. Outcome: Progressing   Problem: RH SKIN INTEGRITY Goal: RH STG ABLE TO PERFORM INCISION/WOUND CARE W/ASSISTANCE Description: STG Able To Perform Incision/Wound Care With Supervision Assistance. Outcome: Progressing   Problem: RH SAFETY Goal: RH STG ADHERE TO SAFETY PRECAUTIONS W/ASSISTANCE/DEVICE Description: STG Adhere to Safety Precautions With Cues and Reminders. Outcome: Progressing   Problem: RH COGNITION-NURSING Goal: RH STG USES MEMORY AIDS/STRATEGIES W/ASSIST TO PROBLEM SOLVE Description: STG Uses Memory Aids/Strategies With Cues and Reminders to Problem Solve. Outcome: Progressing   Problem: RH PAIN MANAGEMENT Goal: RH STG PAIN MANAGED AT OR BELOW PT'S PAIN GOAL Description: < 4 on a 0-10 pain scale. Outcome: Progressing   Problem: RH KNOWLEDGE DEFICIT BRAIN INJURY Goal: RH STG INCREASE KNOWLEDGE OF SELF CARE AFTER BRAIN INJURY Description: Patient will demonstrate knowledge of medication management, pain management, skin/wound care with educational materials and handouts provided by staff independently at discharge. Outcome: Progressing

## 2020-09-14 LAB — CBC
HCT: 38 % (ref 36.0–46.0)
Hemoglobin: 12.1 g/dL (ref 12.0–15.0)
MCH: 28.5 pg (ref 26.0–34.0)
MCHC: 31.8 g/dL (ref 30.0–36.0)
MCV: 89.4 fL (ref 80.0–100.0)
Platelets: 313 10*3/uL (ref 150–400)
RBC: 4.25 MIL/uL (ref 3.87–5.11)
RDW: 15.7 % — ABNORMAL HIGH (ref 11.5–15.5)
WBC: 20.8 10*3/uL — ABNORMAL HIGH (ref 4.0–10.5)
nRBC: 0 % (ref 0.0–0.2)

## 2020-09-14 LAB — HEPATIC FUNCTION PANEL
ALT: 34 U/L (ref 0–44)
AST: 20 U/L (ref 15–41)
Albumin: 3.1 g/dL — ABNORMAL LOW (ref 3.5–5.0)
Alkaline Phosphatase: 65 U/L (ref 38–126)
Bilirubin, Direct: 0.2 mg/dL (ref 0.0–0.2)
Indirect Bilirubin: 0.4 mg/dL (ref 0.3–0.9)
Total Bilirubin: 0.6 mg/dL (ref 0.3–1.2)
Total Protein: 6.2 g/dL — ABNORMAL LOW (ref 6.5–8.1)

## 2020-09-14 LAB — GLUCOSE, CAPILLARY
Glucose-Capillary: 147 mg/dL — ABNORMAL HIGH (ref 70–99)
Glucose-Capillary: 152 mg/dL — ABNORMAL HIGH (ref 70–99)
Glucose-Capillary: 94 mg/dL (ref 70–99)
Glucose-Capillary: 108 mg/dL — ABNORMAL HIGH (ref 70–99)

## 2020-09-14 LAB — BASIC METABOLIC PANEL
Anion gap: 9 (ref 5–15)
BUN: 21 mg/dL — ABNORMAL HIGH (ref 6–20)
CO2: 28 mmol/L (ref 22–32)
Calcium: 9.3 mg/dL (ref 8.9–10.3)
Chloride: 99 mmol/L (ref 98–111)
Creatinine, Ser: 0.81 mg/dL (ref 0.44–1.00)
GFR, Estimated: >60 mL/min (ref >60)
Glucose, Bld: 108 mg/dL — ABNORMAL HIGH (ref 70–99)
Potassium: 4.2 mmol/L (ref 3.5–5.1)
Sodium: 136 mmol/L (ref 135–145)

## 2020-09-14 LAB — LIPID PANEL
Cholesterol: 228 mg/dL — ABNORMAL HIGH (ref 0–200)
HDL: 87 mg/dL (ref >40)
LDL Cholesterol: 110 mg/dL — ABNORMAL HIGH (ref 0–99)
Total CHOL/HDL Ratio: 2.6 RATIO
Triglycerides: 155 mg/dL — ABNORMAL HIGH (ref <150)
VLDL: 31 mg/dL (ref 0–40)

## 2020-09-14 MED ORDER — VALACYCLOVIR HCL 500 MG PO TABS
500.0000 mg | ORAL_TABLET | Freq: Two times a day (BID) | ORAL | Status: AC
  Administered 2020-09-14 – 2020-09-16 (×6): 500 mg via ORAL
  Filled 2020-09-14 (×7): qty 1

## 2020-09-14 MED ORDER — GABAPENTIN 300 MG PO CAPS
600.0000 mg | ORAL_CAPSULE | Freq: Three times a day (TID) | ORAL | Status: DC
  Administered 2020-09-14 – 2020-09-24 (×30): 600 mg via ORAL
  Filled 2020-09-14 (×30): qty 2

## 2020-09-14 NOTE — Progress Notes (Signed)
Occupational Therapy Weekly Progress Note  Patient Details  Name: Lindsay Rivera MRN: [id number] Date of Birth: 01/20/1961  Beginning of progress report period: September 08, 2020 End of progress report period: September 14, 2020  Today's Date: 09/14/2020 OT Individual Time: 1230-1355 OT Individual Time Calculation (min): 85 min   Patient has met 3 of 3 short term goals.  Patient is making steady progress towards OT goals. She has had a few set-backs this week, refusing therapy a few times due to pain, but has overall made progress this week. Pt is working in functional ambulation and functional transfers at [place] level. She still has some poor safety awareness and L sided weakness affecting her safety within BADL tasks. Continue current POC.   Patient continues to demonstrate the following deficits: muscle weakness, decreased cardiorespiratoy endurance, impaired timing and sequencing, unbalanced muscle activation, motor apraxia, decreased coordination, and decreased motor planning, decreased attention to left, decreased awareness, decreased problem solving, decreased safety awareness, and decreased memory, and decreased standing balance, decreased postural control, hemiplegia, and decreased balance strategies and therefore will continue to benefit from skilled OT intervention to enhance overall performance with BADL and Reduce care partner burden.  Patient progressing toward long term goals..  Continue plan of care.  OT Short Term Goals Week 1:  OT Short Term Goal 1 (Week 1): Petient will maintain standing balance with CGA within functional BADL task OT Short Term Goal 1 - Progress (Week 1): Met OT Short Term Goal 2 (Week 1): Pt will complete 2 steps of toileting OT Short Term Goal 2 - Progress (Week 1): Met OT Short Term Goal 3 (Week 1): Pt will use L UE at dimihsed level with min questioning cues. OT Short Term Goal 3 - Progress (Week 1): Met Week 2:  OT Short Term Goal 1 (Week 2): Pt will  ambulate into bathroom with CGA OT Short Term Goal 2 (Week 2): Pt will maintain standing for 5 minutes in preparation for BADL tasks OT Short Term Goal 3 (Week 2): Pt will be given UB home exercise program  Skilled Therapeutic Interventions/Progress Updates:    Upon initial attempt to see patient, pt upset and talking to social worker regarding her LLE pain. Pt tearful and refusing to participate. Pt stated she would try to participate in OT session later today. OT returned after lunch and pt agreeable to OT, but declined any BADL tasks right now. Pt completed bed mobility supervision and donned socks with set-up. Min A stand-pivot over to wc. OT brought pt outside for change on scenery to help pt calm down and relax for therapy session. Pt appreciative of fresh air. Functional ambulation outside on concrete surface with RW and focus on hip and knee extension to take step with  LLE. Pt with very poor awareness and sensation/proprioception deficits on L side, mostly dragging LLE through instead of taking a step. OT brought pt back inside and used mirror feedback to help bring awareness to LLE position. Seated toe taps on cone, progressing to standing toe taps. Improved step with more hip/knee extension after practice with cones and stepping forward. Memory task using BITS. Had BITS give verbal and visual cue with words as pt has limited reading abilities at baseline. Pt able to get 5 words with 68% accuracy. OT administered MOCA to pt in which she scored 15/30 with deficits in memory, attention, abstraction, and executive function. Pt returned to room and completed toilet transfer with min A> Pt voided bladder and needed min  A for clothing management. Stand-pivot back to bed min A. Pt left semi-reclined in bed with bed alarm on, call bell in reach, and needs met.   Therapy Documentation Precautions:  Precautions Precautions: Fall Restrictions Weight Bearing Restrictions: No Pain:  Denies  pain  Therapy/Group: Individual Therapy  Valma Cava 09/14/2020, 1:54 PM

## 2020-09-14 NOTE — Progress Notes (Signed)
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 Started valcyclovir '500mg'$  which she takes daily at home Says her CPAP mask is broken at home- messaged Auria to see if she can speak with her today  ROS: Patient denies fever, rash, sore throat, blurred vision, nausea, vomiting, diarrhea, cough, shortness of breath or chest pain,  +pain from herpetic lesion on right thigh    Objective:   No results found. Recent Labs    09/14/20 0533  WBC 20.8*  HGB 12.1  HCT 38.0  PLT 313    Recent Labs    09/14/20 0533  NA 136  K 4.2  CL 99  CO2 28  GLUCOSE 108*  BUN 21*  CREATININE 0.81  CALCIUM 9.3     Intake/Output Summary (Last 24 hours) at 09/14/2020 0931 Last data filed at 09/14/2020 0700 Gross per 24 hour  Intake 600 ml  Output --  Net 600 ml        Physical Exam: Vital Signs Blood pressure 120/66, pulse (!) 58, temperature 98.3 F (36.8 C), temperature source Axillary, resp. rate 16, height '5\' 2"'$  (1.575 m), weight 70.4 kg, SpO2 100 %.  Constitutional: No distress . Vital signs reviewed. HEENT: NCAT, EOMI, oral membranes moist Neck: supple Cardiovascular: Bradycardia  Respiratory/Chest: CTA Bilaterally without wheezes or rales. Normal effort    GI/Abdomen: BS +, non-tender, non-distended Ext: no clubbing, cyanosis, or edema Psych: pleasant and a little anxious but  much improved Skin: right crani incision CDI. With one proximal suture which has been removed.  Right cheek pressure injury heeling. Right thigh herpetic lesion Neuro:  impaired hearing right ear.. Sensory exam decreased to LT along right peri-oral area, left face? And left arm and leg---stable.   moves all 4's.--neuro exam remains stable Musculoskeletal: Full ROM, No pain with AROM or PROM in the neck, trunk, or extremities. Posture appropriate. Sternal discomfort better   Assessment/Plan: 1. Functional deficits  which require 3+ hours per day of interdisciplinary therapy in a comprehensive inpatient rehab setting. Physiatrist is providing close team supervision and 24 hour management of active medical problems listed below. Physiatrist and rehab team continue to assess barriers to discharge/monitor patient progress toward functional and medical goals  Care Tool:  Bathing    Body parts bathed by patient: Right arm, Left arm, Chest, Abdomen, Front perineal area, Buttocks, Right upper leg, Left upper leg, Right lower leg, Left lower leg, Face         Bathing assist Assist Level: Supervision/Verbal cueing     Upper Body Dressing/Undressing Upper body dressing   What is the patient wearing?: Pull over shirt    Upper body assist Assist Level: Set up assist    Lower Body Dressing/Undressing Lower body dressing      What is the patient wearing?: Pants     Lower body assist Assist for lower body dressing: Contact Guard/Touching assist     Toileting Toileting    Toileting assist Assist for toileting: Contact Guard/Touching assist     Transfers Chair/bed transfer  Transfers assist     Chair/bed transfer assist level: Moderate Assistance - Patient 50 - 74%     Locomotion  Ambulation   Ambulation assist      Assist level: Contact Guard/Touching assist Assistive device: Walker-standard Max distance: 60   Walk 10 feet activity   Assist     Assist level: Minimal Assistance - Patient > 75% Assistive device: Walker-standard   Walk 50 feet activity   Assist    Assist level: Contact Guard/Touching assist Assistive device: Walker-standard    Walk 150 feet activity   Assist Walk 150 feet activity did not occur: Safety/medical concerns         Walk 10 feet on uneven surface  activity   Assist     Assist level: Maximal Assistance - Patient 25 - 49% Assistive device:  (R hand rail)   Wheelchair     Assist Will patient use wheelchair at discharge?:  No Type of Wheelchair: Manual    Wheelchair assist level: Moderate Assistance - Patient 50 - 74% Max wheelchair distance: 50    Wheelchair 50 feet with 2 turns activity    Assist        Assist Level: Moderate Assistance - Patient 50 - 74%   Wheelchair 150 feet activity     Assist          Blood pressure 120/66, pulse (!) 58, temperature 98.3 F (36.8 C), temperature source Axillary, resp. rate 16, height '5\' 2"'$  (1.575 m), weight 70.4 kg, SpO2 100 %. Medical Problem List and Plan: 1.  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  Antithrombotics: -DVT/anticoagulation:  Pharmaceutical: Lovenox             -antiplatelet therapy: N/A 3. Headaches/Pain Management:  Continue hydrocodone prn--  -continue gabapentin for neuropathic pain to 400 mg tid   -behavioral mod.  4. Mood: Team to provide ego support. LCSW to follow for evaluation and support.              -antipsychotic agents:  seroquel 5. Neuropsych: This patient is capable of making decisions on her own behalf. 6. Skin/Wound Care: Monitor incision for healing.   -keep facial wound clean  7. Fluids/Electrolytes/Nutrition: BUN up  -encourage fluids 8. HTN: Monitor BP tid--continue Norvasc and Maxizide -borderline, behavior not helping, 9. T2DM: Hgb A1c-             --monitor BS ac/hs and use SSI for elevated BS. 10. H/o bipolar d/o and reported schizophrenia: suicidal ideations Thursday  -improving -increased Prozac to '80mg'$  8/6 -Continue Xanax bid prn w/ atarax prn.  -pt needs ego support -seroquel '50mg'$  bid added to '100mg'$  qhs   -gabapentin '300mg'$  tid---increased to '400mg'$  tid 8/6 -appreciate psychiatry input, counseling -neuropsychology input would also be helpful -sitter no longer needed.  -weaning steroids to off(I think  this was a major factor) -chaplain consult 11. Constipation: On Linzess. scheduled miralax               --large bm's 8/4 after sorbitol---needs bm today 8/7  -dulcolax supp prn, enema prn 12. OSA: Has been compliant with CPAP. Will need to order her a new mask for home.  13. Chronic cough: For past 3-4 months. scheduled Mucinex DM.             --pulmonary hygiene with flutter valve. 14. Cerebral edema: tapering decadron  to '1mg'$  q12 15. Right thigh herpetic lesion: started Valcycolvir '500mg'$  daily.      LOS:g  7 days A FACE TO FACE EVALUATION WAS PERFORMED  Clide Deutscher Florentino Laabs 09/14/2020, 9:31 AM

## 2020-09-14 NOTE — Progress Notes (Signed)
Patient has a red spot that is raised and itching on her right lateral thigh. She believes it is a Herpes breakout. She would like to discuss meds. for her Herpes that she takes at home with the M.D. this a.m.
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
## 2020-09-14 NOTE — Progress Notes (Signed)
Physical Therapy Session Note  Patient Details  Name: Lindsay Rivera MRN: [id number] Date of Birth: 06-Aug-1960  Today's Date: 09/14/2020 PT Individual Time: [id number] PT Individual Time Calculation (min): 32 min   PT Amount of Missed Time (min): 13 Minutes PT Missed Treatment Reason: Patient fatigue;Patient unwilling to participate;Pain  Short Term Goals: Week 1:  PT Short Term Goal 1 (Week 1): Pt will perform bed to chair transfer with CGA. PT Short Term Goal 2 (Week 1): Pt will ambulate x150' with CGA. PT Short Term Goal 3 (Week 1): Pt will complete x4 steps with BHRs and CGA.  Skilled Therapeutic Interventions/Progress Updates:  Patient supine in bed on entrance to room. Patient alert but not initially agreeable to PT session. Patient relates fatigue from previous OT session and pain in LLE from hip to foot that is keeping her from performance.  Guided pt in relating her story to therapist for improved therapeutic alliance to improve pt buy-in to therapy and allow for improved interaction and mood. During time of pt relating her story, therapist able to guide pt in performing below bed mobility and intermittent aspects of NMR.   Therapeutic Activity: Bed Mobility: Patient performed roll to L side and back to R side with supervision. Perfoms supine <> long sit with supervision. No vc/ tc required this session for technique.  Neuromuscular Re-ed: NMR facilitated during session with focus on improving muscle activation and motor control in LLE. Pt performs resisted LLE hip/ knee extension with improved accuracy of movement with repetition, Bilateral ankle mobility, straight leg abd/ add, heel slides. Pt also performs forward reaches for modified abdominal crunches to improve abdominal strengthening. NMR performed for improvements in motor control and coordination, balance, sequencing, judgement, and self confidence/ efficacy in performing all aspects of mobility at highest level of  independence.   Patient supine  in bed at end of session in improved spirits with brakes locked on bed, bed alarm set, and all needs within reach.     Therapy Documentation Precautions:  Precautions Precautions: Fall Restrictions Weight Bearing Restrictions: No  Therapy/Group: Individual Therapy  Alger Simons PT, DPT 09/14/2020, 6:02 PM

## 2020-09-15 LAB — GLUCOSE, CAPILLARY
Glucose-Capillary: 114 mg/dL — ABNORMAL HIGH (ref 70–99)
Glucose-Capillary: 119 mg/dL — ABNORMAL HIGH (ref 70–99)
Glucose-Capillary: 114 mg/dL — ABNORMAL HIGH (ref 70–99)
Glucose-Capillary: 175 mg/dL — ABNORMAL HIGH (ref 70–99)

## 2020-09-15 MED ORDER — HYDROXYZINE HCL 25 MG PO TABS
25.0000 mg | ORAL_TABLET | Freq: Three times a day (TID) | ORAL | Status: DC | PRN
  Administered 2020-09-16 – 2020-09-22 (×8): 25 mg via ORAL
  Filled 2020-09-15 (×8): qty 1

## 2020-09-15 MED ORDER — CARVEDILOL 3.125 MG PO TABS
3.1250 mg | ORAL_TABLET | Freq: Every day | ORAL | Status: DC
  Administered 2020-09-16 – 2020-09-24 (×9): 3.125 mg via ORAL
  Filled 2020-09-15 (×9): qty 1

## 2020-09-15 NOTE — Progress Notes (Signed)
Patient ID: Lindsay Rivera, female   DOB: [birth year], 60 y.o.   MRN: [id number]  SW met with pt to provide updates from team conference, and discharge date 8/19. Pt amenable to staying as she understands she requires therapy. Pt reported that aside from TTB recommended, she will need a 3in1 BSC. SW informed once aware of all DME recommendations will order. Pt aware SW to follow-up with her son Lindsay Rivera.  45- SW spoke with [name] [phone]) to inform on above. SW discussed recommendation of 24/7 supervision to intermittent care goal, however supervision was highly recommended due to safety. SW discussed if there was anyone else that may be able to help. SW informed on requesting therapies at time of discharge. SW discussed family edu. Scheduled for Monday (8/15) 9am-12pm. He is aware SW will continue to follow-up once there are more updates.   Loralee Pacas, MSW, [place] Office: [phone] Cell: [phone] Fax: [phone]

## 2020-09-15 NOTE — Progress Notes (Signed)
Physical Therapy Weekly Progress Note  Patient Details  Name: Lindsay Rivera MRN: [id number] Date of Birth: June 04, 1960  Beginning of progress report period: September 08, 2020 End of progress report period: September 15, 2020  Today's Date: 09/15/2020 PT Individual Time: [id number] PT Individual Time Calculation (min): 26 min   Patient has met 1 of 3 short term goals.  Pt is progressing toward mobility goals, improving independence with bed mobility, transfers, and ambulation. Pt has been slow to meeting goals, however, with refusal of multiple treatment sessions. Pt appears to be more motivated to participate consistently now, and hopefully will see hastened improvement with more consistent therapies. Coming week to focus on L hemibody NMR, balance, ambulation, stairs, and DC planning.  Patient continues to demonstrate the following deficits muscle weakness, decreased cardiorespiratoy endurance, decreased coordination, and decreased standing balance, hemiplegia, and decreased balance strategies and therefore will continue to benefit from skilled PT intervention to increase functional independence with mobility.  Patient progressing toward long term goals..  Continue plan of care.  PT Short Term Goals Week 1:  PT Short Term Goal 1 (Week 1): Pt will perform bed to chair transfer with CGA. PT Short Term Goal 1 - Progress (Week 1): Met PT Short Term Goal 2 (Week 1): Pt will ambulate x150' with CGA. PT Short Term Goal 2 - Progress (Week 1): Progressing toward goal PT Short Term Goal 3 (Week 1): Pt will complete x4 steps with BHRs and CGA. PT Short Term Goal 3 - Progress (Week 1): Progressing toward goal Week 2:  PT Short Term Goal 1 (Week 2): STGs = LTGs  Skilled Therapeutic Interventions/Progress Updates:  Ambulation/gait training;Community reintegration;DME/adaptive equipment instruction;Neuromuscular re-education;Psychosocial support;Stair training;UE/LE Strength taining/ROM;Balance/vestibular  training;Discharge planning;Functional electrical stimulation;Pain management;Therapeutic Activities;UE/LE Coordination activities;Cognitive remediation/compensation;Disease management/prevention;Functional mobility training;Patient/family education;Therapeutic Exercise   Pt received supine in bed and agrees to therapy. Reports headache pain but also reports having just taken pain meds. Number not provided. PT provides rest breaks to manage pain. Pt performs bed mobility slowly with supervision for cues on positioning at EOB. Pt performs sit to stand with CGA and sidesteps along edge of bed to [facility] with minA and cues for sequencing and body mechanics. WC transport to gym for time management. Pt completes balance and gait training in parallel bars. Cones set up for pt to step over to practice single leg balance, transitioning weight forward and laterally and increased motor control demand on L lower extremity. Pt provides CGA and pt performs going forward x40' total. Pt then performs with sidestepping to incorporate hip abductors and dynamic balance. PT cues for posture and weight shifting to facilitate optimal performance.  Pt ambulates x150' with minA and no AD. Pt initially ambulates with minimal knee flexion on L side, foot flat, and decreased coordination. With cueing pt able to improve gait mechanics and also increases gait speed, though still requiring minA for stability and occasional partial buckle of L knee. WC transport back to room. Stand step back to bed with CGA. Left supine with alarm intact and all needs within reach.  Therapy Documentation Precautions:  Precautions Precautions: Fall Restrictions Weight Bearing Restrictions: No   Therapy/Group: Individual Therapy  Breck Coons, PT, DPT 09/15/2020, 2:54 PM

## 2020-09-15 NOTE — Plan of Care (Signed)
  Problem: Consults Goal: RH BRAIN INJURY PATIENT EDUCATION Description: Description: See Patient Education module for eduction specifics Outcome: Progressing Goal: Skin Care Protocol Initiated - if Braden Score 18 or less Description: If consults are not indicated, leave blank or document N/A Outcome: Progressing Goal: Diabetes Guidelines if Diabetic/Glucose > 140 Description: If diabetic or lab glucose is > 140 mg/dl - Initiate Diabetes/Hyperglycemia Guidelines & Document Interventions  Outcome: Progressing   Problem: RH BOWEL ELIMINATION Goal: RH STG MANAGE BOWEL WITH ASSISTANCE Description: STG Manage Bowel with Supervision Assistance. Outcome: Progressing   Problem: RH SKIN INTEGRITY Goal: RH STG ABLE TO PERFORM INCISION/WOUND CARE W/ASSISTANCE Description: STG Able To Perform Incision/Wound Care With Supervision Assistance. Outcome: Progressing   Problem: RH SAFETY Goal: RH STG ADHERE TO SAFETY PRECAUTIONS W/ASSISTANCE/DEVICE Description: STG Adhere to Safety Precautions With Cues and Reminders. Outcome: Progressing   Problem: RH COGNITION-NURSING Goal: RH STG USES MEMORY AIDS/STRATEGIES W/ASSIST TO PROBLEM SOLVE Description: STG Uses Memory Aids/Strategies With Cues and Reminders to Problem Solve. Outcome: Progressing   Problem: RH PAIN MANAGEMENT Goal: RH STG PAIN MANAGED AT OR BELOW PT'S PAIN GOAL Description: < 4 on a 0-10 pain scale. Outcome: Progressing   Problem: RH KNOWLEDGE DEFICIT BRAIN INJURY Goal: RH STG INCREASE KNOWLEDGE OF SELF CARE AFTER BRAIN INJURY Description: Patient will demonstrate knowledge of medication management, pain management, skin/wound care with educational materials and handouts provided by staff independently at discharge. Outcome: Progressing
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
## 2020-09-15 NOTE — Progress Notes (Signed)
Occupational Therapy Session Note  Patient Details  Name: Lindsay Rivera MRN: [id number] Date of Birth: May 01, 1960  Today's Date: 09/15/2020 OT Individual Time: 4695-0722 OT Individual Time Calculation (min): 60 min    Short Term Goals: Week 2:  OT Short Term Goal 1 (Week 2): Pt will ambulate into bathroom with CGA OT Short Term Goal 2 (Week 2): Pt will maintain standing for 5 minutes in preparation for BADL tasks OT Short Term Goal 3 (Week 2): Pt will be given UB home exercise program  Skilled Therapeutic Interventions/Progress Updates:    Pt received in bed and agreeable to therapy. She stated she showered last night and requesting to go outside.  ADL Retraining: Upon returning from outside, pt felt the need to void. CGA from wc to toilet and back with stand pivot.  CGA with clothing management.  Pt only had gas, no void.    Transfers: Initially pt needed min with sit to stand from bed to straight stand (no RW) and then progressed to CGA, with RW pt ambulated 10 ft to wc with min A and cues to fully lift L foot in step forward and then to advance R foot past L vs stepping together.  Balance: CGA with stand if pt had feet well positioned, if she rushed her movement patterns she needed min A with decreased LLE stability  Neuromuscular Re-Education:  From EOB, focused on controlled sit to stand with symmetrical WB on B feet with a controlled forward wt shift to stand and slow lower to sit 8x.  Outside on patio, pt positioned with feet at back of picnic chair and then pt used back of chair for support in standing: -B heel raises -isometric strength of L knee in extension with R knee bends -stepping L foot out to side and back in focusing on adequate stepping clearance -stepping L foot forward and back with adequate stepping clearance Pt stated she continues to have severely decreased LLE sensation.   Pt returned to room and opted to sit in wc until next session. Belt alarm on and  all needs met.    Therapy Documentation Precautions:  Precautions Precautions: Fall Restrictions Weight Bearing Restrictions: No  Pain: Pain Assessment Pain Scale: 0-10 Pain Score: 6  - left leg - premedicated ADL: ADL Eating: Set up Grooming: Modified independent Upper Body Bathing: Supervision/safety Where Assessed-Upper Body Bathing: Shower Lower Body Bathing: Supervision/safety Where Assessed-Lower Body Bathing: Shower Upper Body Dressing: Supervision/safety Lower Body Dressing: Contact guard Toileting: Contact guard Toilet Transfer: Minimal assistance Toilet Transfer Method: Counselling psychologist: Emergency planning/management officer Transfer: Minimal assistance Social research officer, government Method: Heritage manager: Grab bars, Transfer tub bench      Therapy/Group: Individual Therapy  [place] 09/15/2020, 10:50 AM

## 2020-09-15 NOTE — Progress Notes (Signed)
PROGRESS NOTE   Subjective/Complaints: Team conference today Drags left foot with PT To be evaluated by SLP Planning d/c for Friday 19th.  Feeling much better today  ROS: Patient denies fever, rash, sore throat, blurred vision, nausea, vomiting, diarrhea, cough, shortness of breath or chest pain,  +pain from herpetic lesion on right thigh, +no tearing from right eye    Objective:   No results found. Recent Labs    09/14/20 0533  WBC 20.8*  HGB 12.1  HCT 38.0  PLT 313    Recent Labs    09/14/20 0533  NA 136  K 4.2  CL 99  CO2 28  GLUCOSE 108*  BUN 21*  CREATININE 0.81  CALCIUM 9.3     Intake/Output Summary (Last 24 hours) at 09/15/2020 1041 Last data filed at 09/14/2020 1300 Gross per 24 hour  Intake 240 ml  Output --  Net 240 ml        Physical Exam: Vital Signs Blood pressure 132/84, pulse (!) 58, temperature 97.9 F (36.6 C), resp. rate 18, height '5\' 2"'$  (1.575 m), weight 70.4 kg, SpO2 100 %.  Constitutional: No distress . Vital signs reviewed. HEENT: NCAT, EOMI, oral membranes moist Neck: supple Cardiovascular: Bradycardia Respiratory/Chest: CTA Bilaterally without wheezes or rales. Normal effort    GI/Abdomen: BS +, non-tender, non-distended Ext: no clubbing, cyanosis, or edema Psych: pleasant and a little anxious but  much improved Skin: right crani incision CDI. With one proximal suture which has been removed.  Right cheek pressure injury heeling. Right thigh herpetic lesion Neuro:  impaired hearing right ear.. Sensory exam decreased to LT along right peri-oral area, left face? And left arm and leg---stable.   moves all 4's.--neuro exam remains stable Musculoskeletal: Full ROM, No pain with AROM or PROM in the neck, trunk, or extremities. Posture appropriate. Sternal discomfort better   Assessment/Plan: 1. Functional deficits which require 3+ hours per day of interdisciplinary therapy in a  comprehensive inpatient rehab setting. Physiatrist is providing close team supervision and 24 hour management of active medical problems listed below. Physiatrist and rehab team continue to assess barriers to discharge/monitor patient progress toward functional and medical goals  Care Tool:  Bathing    Body parts bathed by patient: Right arm, Left arm, Chest, Abdomen, Front perineal area, Buttocks, Right upper leg, Left upper leg, Right lower leg, Left lower leg, Face         Bathing assist Assist Level: Supervision/Verbal cueing     Upper Body Dressing/Undressing Upper body dressing   What is the patient wearing?: Pull over shirt    Upper body assist Assist Level: Set up assist    Lower Body Dressing/Undressing Lower body dressing      What is the patient wearing?: Pants     Lower body assist Assist for lower body dressing: Contact Guard/Touching assist     Toileting Toileting    Toileting assist Assist for toileting: Contact Guard/Touching assist     Transfers Chair/bed transfer  Transfers assist     Chair/bed transfer assist level: Moderate Assistance - Patient 50 - 74%     Locomotion Ambulation   Ambulation assist      Assist  level: Contact Guard/Touching assist Assistive device: Walker-standard Max distance: 60   Walk 10 feet activity   Assist     Assist level: Minimal Assistance - Patient > 75% Assistive device: Walker-standard   Walk 50 feet activity   Assist    Assist level: Contact Guard/Touching assist Assistive device: Walker-standard    Walk 150 feet activity   Assist Walk 150 feet activity did not occur: Safety/medical concerns         Walk 10 feet on uneven surface  activity   Assist     Assist level: Maximal Assistance - Patient 25 - 49% Assistive device:  (R hand rail)   Wheelchair     Assist Will patient use wheelchair at discharge?: No Type of Wheelchair: Manual    Wheelchair assist level:  Moderate Assistance - Patient 50 - 74% Max wheelchair distance: 50    Wheelchair 50 feet with 2 turns activity    Assist        Assist Level: Moderate Assistance - Patient 50 - 74%   Wheelchair 150 feet activity     Assist          Blood pressure 132/84, pulse (!) 58, temperature 97.9 F (36.6 C), resp. rate 18, height '5\' 2"'$  (1.575 m), weight 70.4 kg, SpO2 100 %. Medical Problem List and Plan: 1.  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  Antithrombotics: -DVT/anticoagulation:  Pharmaceutical: Lovenox             -antiplatelet therapy: N/A 3. Headaches/Pain Management:  Continue hydrocodone prn--  -continue gabapentin for neuropathic pain to 400 mg tid   -behavioral mod.  4. Mood: Team to provide ego support. LCSW to follow for evaluation and support.              -antipsychotic agents:  seroquel 5. Neuropsych: This patient is capable of making decisions on her own behalf. 6. Skin/Wound Care: Monitor incision for healing.   -keep facial wound clean  7. Fluids/Electrolytes/Nutrition: BUN up  -encourage fluids 8. HTN: Monitor BP tid--continue Norvasc and Maxizide -borderline, behavior not helping, 9. T2DM: Hgb A1c-             --monitor BS ac/hs and use SSI for elevated BS. 10. H/o bipolar d/o and reported schizophrenia: suicidal ideations Thursday  -improving -increased Prozac to '80mg'$  8/6 -Continue Xanax bid prn w/ atarax prn.  -pt needs ego support -seroquel '50mg'$  bid added to '100mg'$  qhs   -gabapentin '300mg'$  tid---increased to '400mg'$  tid 8/6 -appreciate psychiatry input, counseling -neuropsychology input would also be helpful -sitter no longer needed.  -weaning steroids to off(I think this was a major factor) -chaplain consult 11.  Constipation: On Linzess. scheduled miralax               --large bm's 8/4 after sorbitol---needs bm today 8/7  -dulcolax supp prn, enema prn 12. OSA: Has been compliant with CPAP. Will need to order her a new mask for home.  13. Chronic cough: For past 3-4 months. Continue scheduled Mucinex DM.             --pulmonary hygiene with flutter valve. 14. Cerebral edema: tapering decadron to '1mg'$  q12 15. Right thigh  herpetic lesion: started Valcycolvir '500mg'$  daily. 16. Bradycardia: decrease Coreg to daily 17. No tearing from right eye: will let NSGY know in case of damage to lacrimal gland.       LOS:g  8 days A FACE TO FACE EVALUATION WAS PERFORMED  Lindsay Rivera 09/15/2020, 10:41 AM

## 2020-09-15 NOTE — Progress Notes (Signed)
Occupational Therapy Session Note  Patient Details  Name: Lindsay Rivera MRN: [id number] Date of Birth: 06-25-1960  Today's Date: 09/15/2020 OT Individual Time: 1100-1200 OT Individual Time Calculation (min): 60 min   Short Term Goals: Week 2:  OT Short Term Goal 1 (Week 2): Pt will ambulate into bathroom with CGA OT Short Term Goal 2 (Week 2): Pt will maintain standing for 5 minutes in preparation for BADL tasks OT Short Term Goal 3 (Week 2): Pt will be given UB home exercise program  Skilleed Therapeutic Interventions/Progress Updates:    Pt greeted seated in wc and agreeable to OT treatment session. Pt declined any BADLs as she likes to take showers in the evenings before she goes to bed. Pt requesting to go outside for therapy. Pt brought outside in wc for time management. Worked on sit<>stands, L attention, L body awareness and balance with standing toe taps on curb. Pt with poor proprioception of L LE during task. With practice, pt more consistent with L LE placement and increased knee/hip extension with repetition, min A for balance. Pt progressed with functional ambulation on uneven surface outside with min A and cues for LLE clearance. Improved clearance from yesterday. OT discussed dc plan for next Friday with supervision/mod I goals. Educated on pt's high risk of falls given L sided weakness, attention, and sensory issues with pt verbalizing understanding. Pt returned to room and completed stanw-pivot back to bed with min A. Pt left semi-reclined in bed with bed alarm on, call bell in reach, and needs met.   Therapy Documentation Precautions:  Precautions Precautions: Fall Restrictions Weight Bearing Restrictions: No Pain: Pain Assessment Pain Scale: 0-10 Pain Score: 4  Pain in LLE, repositioned for comfort   Therapy/Group: Individual Therapy  Valma Cava 09/15/2020, 12:26 PM

## 2020-09-16 DIAGNOSIS — Z86018 Personal history of other benign neoplasm: Secondary | ICD-10-CM

## 2020-09-16 DIAGNOSIS — Z9889 Other specified postprocedural states: Secondary | ICD-10-CM

## 2020-09-16 DIAGNOSIS — F3130 Bipolar disorder, current episode depressed, mild or moderate severity, unspecified: Secondary | ICD-10-CM

## 2020-09-16 DIAGNOSIS — B029 Zoster without complications: Secondary | ICD-10-CM

## 2020-09-16 DIAGNOSIS — F3162 Bipolar disorder, current episode mixed, moderate: Secondary | ICD-10-CM

## 2020-09-16 DIAGNOSIS — E118 Type 2 diabetes mellitus with unspecified complications: Secondary | ICD-10-CM

## 2020-09-16 LAB — GLUCOSE, CAPILLARY
Glucose-Capillary: 115 mg/dL — ABNORMAL HIGH (ref 70–99)
Glucose-Capillary: 110 mg/dL — ABNORMAL HIGH (ref 70–99)
Glucose-Capillary: 100 mg/dL — ABNORMAL HIGH (ref 70–99)
Glucose-Capillary: 102 mg/dL — ABNORMAL HIGH (ref 70–99)

## 2020-09-16 NOTE — Plan of Care (Signed)
  Problem: Consults Goal: RH BRAIN INJURY PATIENT EDUCATION Description: Description: See Patient Education module for eduction specifics Outcome: Progressing Goal: Skin Care Protocol Initiated - if Braden Score 18 or less Description: If consults are not indicated, leave blank or document N/A Outcome: Progressing Goal: Diabetes Guidelines if Diabetic/Glucose > 140 Description: If diabetic or lab glucose is > 140 mg/dl - Initiate Diabetes/Hyperglycemia Guidelines & Document Interventions  Outcome: Progressing   Problem: RH BOWEL ELIMINATION Goal: RH STG MANAGE BOWEL WITH ASSISTANCE Description: STG Manage Bowel with Supervision Assistance. Outcome: Progressing   Problem: RH SKIN INTEGRITY Goal: RH STG ABLE TO PERFORM INCISION/WOUND CARE W/ASSISTANCE Description: STG Able To Perform Incision/Wound Care With Supervision Assistance. Outcome: Progressing   Problem: RH SAFETY Goal: RH STG ADHERE TO SAFETY PRECAUTIONS W/ASSISTANCE/DEVICE Description: STG Adhere to Safety Precautions With Cues and Reminders. Outcome: Progressing   Problem: RH COGNITION-NURSING Goal: RH STG USES MEMORY AIDS/STRATEGIES W/ASSIST TO PROBLEM SOLVE Description: STG Uses Memory Aids/Strategies With Cues and Reminders to Problem Solve. Outcome: Progressing   Problem: RH PAIN MANAGEMENT Goal: RH STG PAIN MANAGED AT OR BELOW PT'S PAIN GOAL Description: < 4 on a 0-10 pain scale. Outcome: Progressing   Problem: RH KNOWLEDGE DEFICIT BRAIN INJURY Goal: RH STG INCREASE KNOWLEDGE OF SELF CARE AFTER BRAIN INJURY Description: Patient will demonstrate knowledge of medication management, pain management, skin/wound care with educational materials and handouts provided by staff independently at discharge. Outcome: Progressing

## 2020-09-16 NOTE — Progress Notes (Signed)
PROGRESS NOTE   Subjective/Complaints: Patient seen sitting up in bed this morning.  She states she felt very well overnight.  She is pleased with her recent medication changes.  She would like to decrease her steroids further.  She has questions regarding her hearing, crying, right facial numbness. She was seen by Psych yesterday, notes reviewed - no new recs.  ROS: Denies CP, SOB, N/V/D  Objective:   No results found. Recent Labs    09/14/20 0533  WBC 20.8*  HGB 12.1  HCT 38.0  PLT 313     Recent Labs    09/14/20 0533  NA 136  K 4.2  CL 99  CO2 28  GLUCOSE 108*  BUN 21*  CREATININE 0.81  CALCIUM 9.3      Intake/Output Summary (Last 24 hours) at 09/16/2020 1101 Last data filed at 09/15/2020 1750 Gross per 24 hour  Intake 240 ml  Output --  Net 240 ml         Physical Exam: Vital Signs Blood pressure 109/81, pulse 62, temperature 97.6 F (36.4 C), temperature source Oral, resp. rate 18, height '5\' 2"'$  (1.575 m), weight 70.4 kg, SpO2 100 %. Constitutional: No distress . Vital signs reviewed. HENT: Normocephalic.  Atraumatic. Eyes: EOMI. No discharge. Cardiovascular: No JVD.  RRR. Respiratory: Normal effort.  No stridor.  Bilateral clear to auscultation. GI: Non-distended.  BS +. Skin: Warm and dry.  Intact.  Right [place] site CDI Psych: Normal mood.  Slightly anxious. Musc: No edema in extremities.  No tenderness in extremities. Neuro: Alert Sensation diminished to light touch right face  Motor: Bilateral upper extremities: Grossly intact Right lower extremity: Hip flexion 3/5, knee extension 4/5 Left lower extremity: Hip flexion 3-55, knee 4/5  Assessment/Plan: 1. Functional deficits which require 3+ hours per day of interdisciplinary therapy in a comprehensive inpatient rehab setting. Physiatrist is providing close team supervision and 24 hour management of active medical problems listed  below. Physiatrist and rehab team continue to assess barriers to discharge/monitor patient progress toward functional and medical goals  Care Tool:  Bathing    Body parts bathed by patient: Right arm, Left arm, Chest, Abdomen, Front perineal area, Buttocks, Right upper leg, Left upper leg, Right lower leg, Left lower leg, Face         Bathing assist Assist Level: Supervision/Verbal cueing     Upper Body Dressing/Undressing Upper body dressing   What is the patient wearing?: Pull over shirt    Upper body assist Assist Level: Set up assist    Lower Body Dressing/Undressing Lower body dressing      What is the patient wearing?: Pants     Lower body assist Assist for lower body dressing: Contact Guard/Touching assist     Toileting Toileting    Toileting assist Assist for toileting: Contact Guard/Touching assist     Transfers Chair/bed transfer  Transfers assist     Chair/bed transfer assist level: Moderate Assistance - Patient 50 - 74%     Locomotion Ambulation   Ambulation assist      Assist level: Contact Guard/Touching assist Assistive device: Walker-standard Max distance: 60   Walk 10 feet activity   Assist  Assist level: Minimal Assistance - Patient > 75% Assistive device: Walker-standard   Walk 50 feet activity   Assist    Assist level: Contact Guard/Touching assist Assistive device: Walker-standard    Walk 150 feet activity   Assist Walk 150 feet activity did not occur: Safety/medical concerns         Walk 10 feet on uneven surface  activity   Assist     Assist level: Maximal Assistance - Patient 25 - 49% Assistive device:  (R hand rail)   Wheelchair     Assist Will patient use wheelchair at discharge?: No Type of Wheelchair: Manual    Wheelchair assist level: Moderate Assistance - Patient 50 - 74% Max wheelchair distance: 50    Wheelchair 50 feet with 2 turns activity    Assist        Assist  Level: Moderate Assistance - Patient 50 - 74%   Wheelchair 150 feet activity     Assist          Blood pressure 109/81, pulse 62, temperature 97.6 F (36.4 C), temperature source Oral, resp. rate 18, height '5\' 2"'$  (1.575 m), weight 70.4 kg, SpO2 100 %.  Medical Problem List and Plan: 1.  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  Antithrombotics: -DVT/anticoagulation:  Pharmaceutical: Lovenox             -antiplatelet therapy: N/A 3. Headaches/Pain Management:  Continue hydrocodone prn--  -continue gabapentin for neuropathic pain to 400 mg tid   -behavioral mod.  Controlled with meds on 8/10 4. Mood: Team to provide ego support. LCSW to follow for evaluation and support.              -antipsychotic agents:  seroquel 5. Neuropsych: This patient is capable of making decisions on her own behalf. 6. Skin/Wound Care: Monitor incision for healing.   -keep facial wound clean  7. Fluids/Electrolytes/Nutrition:   -encourage fluids 8. HTN: Monitor BP tid--continue Norvasc and Maxizide Relatively controlled on 8/10 9. T2DM: Hgb A1c-6.2 on 7/26             --monitor BS ac/hs and use SSI for elevated BS.  Relatively controlled on 8/10 10. H/o bipolar d/o and reported schizophrenia: suicidal ideations Thursday  -improving -increased Prozac to '80mg'$  8/6 -Continue Xanax bid prn w/ atarax prn.  -pt needs ego support -seroquel '50mg'$  bid added to '100mg'$  qhs   -gabapentin '300mg'$  tid---increased to '400mg'$  tid 8/6 -appreciate psychiatry input, counseling -neuropsychology input would also be helpful -sitter no longer needed.  -weaning steroids, last dose on 8/12 -chaplain consult 11. Constipation: On Linzess. scheduled miralax    -dulcolax supp prn, enema prn 12. OSA: Has been compliant with CPAP. Will need to order her a new mask for home.  13. Chronic cough: For past 3-4 months. Continue scheduled Mucinex DM.              --pulmonary hygiene with flutter valve. 14. Cerebral edema: tapering decadron to '1mg'$  daily now 15. Right thigh herpetic lesion: Cont Valcyclovir '500mg'$  daily. 16. Bradycardia: decrease Coreg to daily 17. No tearing from right eye: ?NSGY aware      LOS:g  9 days A FACE TO FACE EVALUATION WAS PERFORMED  Ernestine Langworthy Lorie Phenix 09/16/2020, 11:01 AM

## 2020-09-16 NOTE — Progress Notes (Signed)
Physical Therapy Session Note  Patient Details  Name: Lindsay Rivera MRN: [id number] Date of Birth: 04-27-1960  Today's Date: 09/16/2020 PT Individual Time: [id number] and [id number] PT Individual Time Calculation (min): 54 min and 25 min  Short Term Goals: Week 2:  PT Short Term Goal 1 (Week 2): STGs = LTGs  Skilled Therapeutic Interventions/Progress Updates:     Pt received seated in recliner and agreeable to therapy. No complaint of pain. Sit to stand with CGA/minA and cues for sequencing and body mechanics. Stand pivot transfer to [facility] with CGA. WC transport outside for time management. Pt performs ambulation outdoors for gait training over unlevel and varying surfaces in open environment. Prior to attempting, PT demonstrates pt's current gait pattern as well as cues for improving deficits, including increasing pushh off at terminal stance to promote adequate knee flexion during swing phase, as well as shifting weight adequately laterally to clear opposite foot. Pt then performs x200' with HHA and minA with cues to increase gait speed to decrease risk for falls and promote implicit motor pattern as pt tends to exhibit ataxic movements when she walks slowly, perhaps "over thinking" cues and gait pattern. Following seated rest break, pt performs additional bouts without HHA, with PT providing light minA at hips to facilitate stability and weight shifting. Pt completes bouts of x150' and x300' with seated rest breaks, and improving gait mechanics. Pt does demo occasional knee flexion in mid to terminal stance phase in L lower extremity, but is able to largely correct with cueing. WC transport back to room. Stand step transfer to bed with CGA. Sit to supine independently. Pt left supine in bed with alarm intact and all needs within reach.  2nd Session: Pt received supine in bed and agrees to therapy. No complaint of pain. Bed mobility independent. Pt performs sit to stand with CGA and ambulates x100' to  dayroom with CGA/minA, with cues to increase quad contraction on L during stance phase, and increasing gait speed to decrease risk for falls. Pt completes Nustep activity for strength and endurance training as well as reciprocal coordination. Pt completes 4x3:00 at workload of 4 with average steps per minute ~40. Seated rest breaks between each bout. Pt ambulates back to room with minA. Left supine with alarm intact and all needs within reach.  Therapy Documentation Precautions:  Precautions Precautions: Fall Restrictions Weight Bearing Restrictions: No    Therapy/Group: Individual Therapy  Breck Coons, PT, DPT 09/16/2020, 3:35 PM

## 2020-09-16 NOTE — Progress Notes (Signed)
Occupational Therapy Session Note  Patient Details  Name: Lindsay Rivera MRN: [id number] Date of Birth: 08/04/1960  Today's Date: 09/16/2020 OT Individual Time: 0950-1050 OT Individual Time Calculation (min): 60 min    Short Term Goals:  Week 2:  OT Short Term Goal 1 (Week 2): Pt will ambulate into bathroom with CGA OT Short Term Goal 2 (Week 2): Pt will maintain standing for 5 minutes in preparation for BADL tasks OT Short Term Goal 3 (Week 2): Pt will be given UB home exercise program   Skilled Therapeutic Interventions/Progress Updates:    Pt received long sitting in bed with no c/o pain. Pt requesting to take shower.  She completed bed mobility to eoB with supervision. CGA squat pivot transfer to the w/c with min cueing for hand placement. Pt propelled w/c into bathroom with min A to position chair for toilet transfer d/t urgent BM needs. Loose BM void, pt completed peri hygiene with supervision, leaning forward. Pt transferred into shower with CGA using grab bar. Pt very resistant to OT providing (S) in shower and argumentative when OT provides reasoning between safety. Pt agreeable to set up assist with distant supervision for seated level bathing. Min cueing for thoroughness of rinsing soap off. Supervision squat pivot transfer back to w/c and then to toilet for another loose BM. Good carryover of transfer techniques. Pt dressed with CGA for LB in standing. Pt still has very unsafe behaviors, often leaning forward in the w/c while sitting on the edge. Pt transferred to the recliner and was left sitting up with all needs met, chair alarm belt fastened.   Pt had 5 loose BM's during session- NT notified.   Therapy Documentation Precautions:  Precautions Precautions: Fall Restrictions Weight Bearing Restrictions: No  Therapy/Group: Individual Therapy  Curtis Sites 09/16/2020, 6:12 AM

## 2020-09-16 NOTE — Patient Care Conference (Signed)
Inpatient RehabilitationTeam Conference and Plan of Care Update Date: 09/15/2020   Time: 10:39 AM    Patient Name: Lindsay Rivera      Medical Record Number: [id number]  Date of Birth: [birth year] Sex: Female         Room/Bed: 4W13C/4W13C-01 Payor Info: Payor: Theme park manager MEDICARE / Plan: [employer] DUAL COMPLETE / Product Type: *No Product type* /    Admit Date/Time:  09/07/2020  5:06 PM  Primary Diagnosis:  Meningioma [facility])  Hospital Problems: Principal Problem:   Meningioma ([place]) Active Problems:   S/P resection of meningioma   Bipolar 1 disorder, mixed, moderate ([place])   Herpes zoster without complication   Controlled type 2 diabetes mellitus with complication, without long-term current use of insulin [facility])    Expected Discharge Date: Expected Discharge Date: 09/25/20  Team Members Present: Physician leading conference: Dr. Leeroy Cha Social Worker Present: Loralee Pacas, [place] Nurse Present: Dorthula Nettles, RN PT Present: Tereasa Coop, PT OT Present: Cherylynn Ridges, OT SLP Present: Lillie Columbia, SLP PPS Coordinator present : Gunnar Fusi, SLP     Current Status/Progress Goal Weekly Team Focus  Bowel/Bladder   continent b/b  regular bowel movement      Swallow/Nutrition/ Hydration             ADL's   Min A, min/mod A transfers  Supervision  L NMR< self-care retrianing, fine motor coordination, safety awareness, dc planning   Mobility   supervision bed mobility, minA/modA ambulation without AD, minA stairs  supervision, mod(I)  increased participation, L NMR, ambulation, balance   Communication             Safety/Cognition/ Behavioral Observations  ST just received orders - eval pending         Pain   c/o headache  pain<3/10  assess pain q 4hr and prn   Skin   surgical incision R head, herpes outbreak R thigh  healing of incision  asses skin q shift and prn     Discharge Planning:  Pt will go home with her son and his girlfriend at time  of discharge. Pt will have intermittent support since son and his griflriend work (7am-6pm). Plans to work on trying to have other family support while she is home. Pt needs to be Mod I or Intermittent supervision at d/c.   Team Discussion: Valacyclovir ordered, CBG's controlled, will need a new CPAP for home. Continent B/B, complains of headaches. Herpetic lesion to right thigh. Contact guard/min assist. High fall risk. More supervision at discharge is preferred. Supervision for bed mobility, min/mod assist ambulation, drags left foot. Neglect and motor planning problems. SLP ordered. Patient on target to meet rehab goals: yes  *See Care Plan and progress notes for long and short-term goals.   Revisions to Treatment Plan:  Started Valacyclovir.  Teaching Needs: Family education, medication management, pain management, skin/wound care, transfer training, gait training, balance training, endurance training, safety awareness.  Current Barriers to Discharge: Decreased caregiver support, Medical stability, Home enviroment access/layout, Wound care, Lack of/limited family support, Medication compliance, and Behavior  Possible Resolutions to Barriers: Continue current medications, provide emotional support.     Medical Summary Current Status: right posterior thigh herpetic lesion, son works, OSA, bipolar disorder with suicidal ideation  Barriers to Discharge: Wound care;Behavior  Barriers to Discharge Comments: right posterior thigh herpetic lesion, son works, OSA, bipolar disorder with suicidal ideation Possible Resolutions to [employer] Focus: start Architectural technologist, trying to find additional family support for her at home, need  to order a new OSA mask for her at home, psych consulted, continue psychiatric medications   Continued Need for Acute Rehabilitation Level of Care: The patient requires daily medical management by a physician with specialized training in physical medicine and  rehabilitation for the following reasons: Direction of a multidisciplinary physical rehabilitation program to maximize functional independence : Yes Medical management of patient stability for increased activity during participation in an intensive rehabilitation regime.: Yes Analysis of laboratory values and/or radiology reports with any subsequent need for medication adjustment and/or medical intervention. : Yes   I attest that I was present, lead the team conference, and concur with the assessment and plan of the team.   Cristi Loron 09/16/2020, 12:41 PM

## 2020-09-17 LAB — GLUCOSE, CAPILLARY
Glucose-Capillary: 98 mg/dL (ref 70–99)
Glucose-Capillary: 154 mg/dL — ABNORMAL HIGH (ref 70–99)
Glucose-Capillary: 93 mg/dL (ref 70–99)
Glucose-Capillary: 142 mg/dL — ABNORMAL HIGH (ref 70–99)

## 2020-09-17 MED ORDER — VALACYCLOVIR HCL 500 MG PO TABS
500.0000 mg | ORAL_TABLET | Freq: Every day | ORAL | Status: DC
  Administered 2020-09-17 – 2020-09-24 (×8): 500 mg via ORAL
  Filled 2020-09-17 (×8): qty 1

## 2020-09-17 MED ORDER — VALACYCLOVIR HCL 500 MG PO TABS: 500.0000 mg | ORAL_TABLET | Freq: Every day | ORAL | Status: DC

## 2020-09-17 NOTE — Progress Notes (Signed)
Occupational Therapy Session Note  Patient Details  Name: Lindsay Rivera MRN: [id number] Date of Birth: 12/02/1960  Today's Date: 09/17/2020 OT Individual Time: 1230-1330 OT Individual Time Calculation (min): 60 min    Short Term Goals: Week 2:  OT Short Term Goal 1 (Week 2): Pt will ambulate into bathroom with CGA OT Short Term Goal 2 (Week 2): Pt will maintain standing for 5 minutes in preparation for BADL tasks OT Short Term Goal 3 (Week 2): Pt will be given UB home exercise program  Skilled Therapeutic Interventions/Progress Updates:    Pt greeted seated in recliner and agreeable to OT treatment session. Pt eager to shower today. Fnuctional ambulation into bathroom with Min HHA and improved step with LLE. Pt voided bladder and completed peri-care with supervision. Min A  to then ambulate to shower from toilet. Bathing completed with overall supervision with use of grab bar for balance while standing to wash buttocks and peri-area. Dressing sit<>stand from wc with supervision and CGA when standing to pull up pants. Pt continues to demonstrate very poor safety awareness and decreased awareness of deficits. OT issued red therapytty and went through L hand exercises with pt. Pt returned to room and pivoted back to bed with supervision. Pt left semi-reclined in bed with needs met.   Therapy Documentation Precautions:  Precautions Precautions: Fall Restrictions Weight Bearing Restrictions: No Pain:  Pain in LLE  Therapy/Group: Individual Therapy   S  09/17/2020, 1:34 PM 

## 2020-09-17 NOTE — Progress Notes (Signed)
PROGRESS NOTE   Subjective/Complaints: She asks for valcyclovir to be scheduled daily as this is how she has received it in the past when she had herpetic lesions- three day course alone was not enough for her  ROS: Denies CP, SOB, N/V/D, +pain in shoulder after fall  Objective:   No results found. No results for input(s): WBC, HGB, HCT, PLT in the last 72 hours.  No results for input(s): NA, K, CL, CO2, GLUCOSE, BUN, CREATININE, CALCIUM in the last 72 hours.   Intake/Output Summary (Last 24 hours) at 09/17/2020 1117 Last data filed at 09/16/2020 1300 Gross per 24 hour  Intake 120 ml  Output --  Net 120 ml        Physical Exam: Vital Signs Blood pressure 107/80, pulse 67, temperature 98.1 F (36.7 C), temperature source Oral, resp. rate 18, height '5\' 2"'$  (1.575 m), weight 70.4 kg, SpO2 100 %. Gen: no distress, normal appearing HEENT: oral mucosa pink and moist, NCAT Cardio: Reg rate Chest: normal effort, normal rate of breathing Abd: soft, non-distended Ext: no edema Psych: pleasant, normal affect Skin: Warm and dry.  Intact.  Right [place] site CDI Psych: Normal mood.  Slightly anxious. Musc: No edema in extremities.  No tenderness in extremities. Neuro: Alert Sensation diminished to light touch right face  Motor: Bilateral upper extremities: Grossly intact Right lower extremity: Hip flexion 3/5, knee extension 4/5 Left lower extremity: Hip flexion 3-55, knee 4/5  Assessment/Plan: 1. Functional deficits which require 3+ hours per day of interdisciplinary therapy in a comprehensive inpatient rehab setting. Physiatrist is providing close team supervision and 24 hour management of active medical problems listed below. Physiatrist and rehab team continue to assess barriers to discharge/monitor patient progress toward functional and medical goals  Care Tool:  Bathing    Body parts bathed by patient: Right arm,  Left arm, Chest, Abdomen, Front perineal area, Buttocks, Right upper leg, Left upper leg, Right lower leg, Left lower leg, Face         Bathing assist Assist Level: Supervision/Verbal cueing     Upper Body Dressing/Undressing Upper body dressing   What is the patient wearing?: Pull over shirt    Upper body assist Assist Level: Set up assist    Lower Body Dressing/Undressing Lower body dressing      What is the patient wearing?: Pants     Lower body assist Assist for lower body dressing: Contact Guard/Touching assist     Toileting Toileting    Toileting assist Assist for toileting: Contact Guard/Touching assist     Transfers Chair/bed transfer  Transfers assist     Chair/bed transfer assist level: Moderate Assistance - Patient 50 - 74%     Locomotion Ambulation   Ambulation assist      Assist level: Contact Guard/Touching assist Assistive device: Walker-standard Max distance: 60   Walk 10 feet activity   Assist     Assist level: Minimal Assistance - Patient > 75% Assistive device: Walker-standard   Walk 50 feet activity   Assist    Assist level: Contact Guard/Touching assist Assistive device: Walker-standard    Walk 150 feet activity   Assist Walk 150 feet activity did  not occur: Safety/medical concerns         Walk 10 feet on uneven surface  activity   Assist     Assist level: Maximal Assistance - Patient 25 - 49% Assistive device:  (R hand rail)   Wheelchair     Assist Will patient use wheelchair at discharge?: No Type of Wheelchair: Manual    Wheelchair assist level: Moderate Assistance - Patient 50 - 74% Max wheelchair distance: 50    Wheelchair 50 feet with 2 turns activity    Assist        Assist Level: Moderate Assistance - Patient 50 - 74%   Wheelchair 150 feet activity     Assist          Blood pressure 107/80, pulse 67, temperature 98.1 F (36.7 C), temperature source Oral, resp. rate  18, height '5\' 2"'$  (1.575 m), weight 70.4 kg, SpO2 100 %.  Medical Problem List and Plan: 1.  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  Antithrombotics: -DVT/anticoagulation:  Pharmaceutical: Lovenox             -antiplatelet therapy: N/A 3. Headaches/Pain Management:  Continue hydrocodone prn--  -continue gabapentin for neuropathic pain to 400 mg tid   -behavioral mod.  Controlled with meds on 8/11 4. Mood: Team to provide ego support. LCSW to follow for evaluation and support.              -antipsychotic agents:  seroquel 5. Neuropsych: This patient is capable of making decisions on her own behalf. 6. Skin/Wound Care: Monitor incision for healing.   -keep facial wound clean  7. Fluids/Electrolytes/Nutrition:   -encourage fluids 8. HTN: Monitor BP tid--continue Norvasc and Maxizide Relatively controlled on 8/11 9. T2DM: Hgb A1c-6.2 on 7/26             --monitor BS ac/hs and use SSI for elevated BS.  Relatively controlled on 8/10 10. H/o bipolar d/o and reported schizophrenia: suicidal ideations Thursday  -improving -increased Prozac to '80mg'$  8/6 -Continue Xanax bid prn w/ atarax prn.  -pt needs ego support -seroquel '50mg'$  bid added to '100mg'$  qhs   -gabapentin '300mg'$  tid---increased to '400mg'$  tid 8/6 -appreciate psychiatry input, counseling -neuropsychology input would also be helpful -sitter no longer needed.  -weaning steroids, last dose on 8/12 -chaplain consult 11. Constipation: On Linzess. scheduled miralax    -dulcolax supp prn, enema prn 12. OSA: Has been compliant with CPAP. Will need to order her a new mask for home.  13. Chronic cough: For past 3-4 months. Continue scheduled Mucinex DM.             --pulmonary hygiene with flutter valve. 14. Cerebral edema: tapering decadron to '1mg'$  daily now 15. Right thigh herpetic lesion: Continue Valcyclovir '500mg'$  daily. 16. Bradycardia: decrease Coreg to  daily 17. No tearing from right eye: ?NSGY aware      LOS:g  10 days A FACE TO FACE EVALUATION WAS PERFORMED  Lindsay Rivera 09/17/2020, 11:17 AM

## 2020-09-17 NOTE — Progress Notes (Signed)
Physical Therapy Session Note  Patient Details  Name: Lindsay Rivera MRN: [id number] Date of Birth: [birth year]  Today's Date: 09/17/2020 PT Individual Time: [id number] and 1135-1200 PT Individual Time Calculation (min): 45 min and 25 min  Short Term Goals: Week 2:  PT Short Term Goal 1 (Week 2): STGs = LTGs  Skilled Therapeutic Interventions/Progress Updates:     Pt received seated in recliner and agrees to therapy. NO complaint of pain but does report having diarrhea and having to constantly get up to use BSC. PT educates on safety and pt verbalizes understanding. Pt attempts stand step transfer to [facility] and requires modA due to LOB and poor motor planning. WC transport outside due to pt request for outdoor ambulation. Pt performs gait training outdoors for ambulation over unlevel and varying surfaces in open environment. Sit to stand multiple reps during session with CGA. Pt ambulates bouts of 550' and 600' with CGA/minA and no AD. Pt has occasional slight knee flexion in L knee during midstance, requiring minA for safety, but otherwise only requires CGA and is able to correct with cueing. Gait pattern also noted to improve when pt is distracted from task and talking about other subjects. PT cues for increased quad contraction during stance phase on L, increased push off at terminal stance to promote knee flexion during swing phase, increased gait speed to decrease risk for falls. Pt also performs x12 steps with R hand rail and minA, with cues for step sequencing. Extended seated rest break taken between bouts of ambulation. WC transport back to room. Stand step transfer back to bed with CGA. Left supine with alarm intact and all needs within reach.  2nd Session: Pt received supine and agrees to therapy NO complaint of pain. Supine to sit with bed features. Pt performs stand step transfer to [facility] with CGA. WC transport to gym for time management. Pt tasked with ambulating with fabric plate on head, to  provide external cue facilitating fluid gait pattern and to limit vertical excursion as a result of L knee flexion during mid stance phase. Pt ambulates 175' without plate falling from head. Following seated rest break, pt attempts again with PT timing and challenging pt to ambulate as quickly as possible. Pt completes in 1:03 without plate falling from head. Following seated rest break, pt performs activity again with same time. WC transport back to room. Stand step transfer back to bed with CGA. Left supine in bed with alarm intact and all needs within reach.  3rd Session: Pt refuses due to headache. PT will follow up as appropriate.  Therapy Documentation Precautions:  Precautions Precautions: Fall Restrictions Weight Bearing Restrictions: No    Therapy/Group: Individual Therapy  Breck Coons, PT, DPT 09/17/2020, 3:56 PM

## 2020-09-17 NOTE — Progress Notes (Signed)
   09/17/20 0920  What Happened  Was fall witnessed? No  Was patient injured? Yes  Patient found other (Comment) (patient on bsc when staff entered room)  Found by No one-pt stated they fell  Stated prior activity bathroom-unassisted  Follow Up  MD notified pam love, pa  Time MD notified 0930  Family notified No - patient refusal (patient notified family)  Additional tests No xray attempted  Simple treatment Ice  Adult Fall Risk Assessment  Risk Factor Category (scoring not indicated) History of more than one fall within 6 months before admission (document High fall risk)  Age 60  Fall History: Fall within 6 months prior to admission 5  Elimination; Bowel and/or Urine Incontinence 0  Elimination; Bowel and/or Urine Urgency/Frequency 2  Medications: includes PCA/Opiates, Anti-convulsants, Anti-hypertensives, Diuretics, Hypnotics, Laxatives, Sedatives, and Psychotropics 5  Patient Care Equipment 2  Mobility-Assistance 2  Mobility-Gait 2  Mobility-Sensory Deficit 0  Altered awareness of immediate physical environment 0  Impulsiveness 2  Lack of understanding of one's physical/cognitive limitations 4  Total Score 24  Patient Fall Risk Level High fall risk  Adult Fall Risk Interventions  Required Bundle Interventions *See Row Information* High fall risk - low, moderate, and high requirements implemented  Additional Interventions Use of appropriate toileting equipment (bedpan, BSC, etc.);Room near nurses station  Screening for Fall Injury Risk (To be completed on HIGH fall risk patients) - Assessing Need for Floor Mats  Risk For Fall Injury- Criteria for Floor Mats Noncompliant with safety precautions  Will Implement Floor Mats Yes  Vitals  Temp 98.1 F (36.7 C)  Temp Source Oral  BP 107/80  MAP (mmHg) 89  BP Location Left Arm  BP Method Automatic  Patient Position (if appropriate) Sitting  Pulse Rate 67  Pulse Rate Source Monitor  Resp 18  Oxygen Therapy  SpO2 100 %  O2  Device Room Air  Pain Assessment  Pain Scale 0-10  Pain Score 4  Pain Type Acute pain  Pain Location Shoulder  Pain Orientation Left  Neurological  Neuro (WDL) X  Level of Consciousness Alert  Orientation Level Oriented X4  Cognition Poor safety awareness  Speech Clear  LUE Sensation Numbness;Tingling  LUE Motor Strength 4  LLE Sensation Numbness  LLE Motor Strength 4  Glasgow Coma Scale  Eye Opening 4  Best Verbal Response (NON-intubated) 5  Best Motor Response 6  Glasgow Coma Scale Score 15  Musculoskeletal  Musculoskeletal (WDL) X  Assistive Device Front wheel walker  Generalized Weakness Yes  Weight Bearing Restrictions No  Musculoskeletal Details  LUE Limited movement  LLE Limited movement  Integumentary  Integumentary (WDL) X  Skin Color Appropriate for ethnicity  Skin Condition Dry  Skin Integrity Surgical Incision (see LDA)  Excoriated Location Face  Excoriated Location Orientation Right  Skin Turgor Non-tenting  Patient got OOB unassisted, call light answered promptly by staff. When staff entered room, pt reported a fall to that staff member. Pt states to this nurse that she did not fall. She states that she did not hit her head, she got up loss balance caught herself but leaned to far over and left shoulder bumped against cushion of recliner.  Patient complained of left shoulder pain. PA made aware; received order for ice and xray of left shoulder. Patient refused both ice and xray.

## 2020-09-18 LAB — GLUCOSE, CAPILLARY
Glucose-Capillary: 117 mg/dL — ABNORMAL HIGH (ref 70–99)
Glucose-Capillary: 135 mg/dL — ABNORMAL HIGH (ref 70–99)
Glucose-Capillary: 128 mg/dL — ABNORMAL HIGH (ref 70–99)
Glucose-Capillary: 129 mg/dL — ABNORMAL HIGH (ref 70–99)

## 2020-09-18 NOTE — Discharge Instructions (Addendum)
Inpatient Rehab Discharge Instructions  SHEELAH SANTULLI Discharge date and time: No discharge date for patient encounter.   Activities/Precautions/ Functional Status: Activity: activity as tolerated Diet: diabetic diet Wound Care: Routine skin checks Functional status:  ___ No restrictions     ___ Walk up steps independently ___ 24/7 supervision/assistance   ___ Walk up steps with assistance ___ Intermittent supervision/assistance  ___ Bathe/dress independently ___ Walk with walker     __x_ Bathe/dress with assistance ___ Walk Independently    ___ Shower independently ___ Walk with assistance    ___ Shower with assistance ___ No alcohol     ___ Return to work/school ________   COMMUNITY REFERRALS UPON DISCHARGE:    Home Health:   PT     OT     NA                Agency: Phone: *WILL CALL TO FOLLOW-UP ONCE AN AGENCY IS IN PLACE.      Medical Equipment/Items Ordered:3IN1 BEDSIDE COMMODE AND TUB TRANSFER BENCH                                                 Agency/Supplier:[facility] [phone]   *REFERRAL SENT TO [facility] FOR PCS. [phone]. PLEASE CALL TO CHECK STATUS.   Special Instructions:  No driving smoking or alcohol  My questions have been answered and I understand these instructions. I will adhere to these goals and the provided educational materials after my discharge from the hospital.  Patient/Caregiver Signature _______________________________ Date __________  Clinician Signature _______________________________________ Date __________  Please bring this form and your medication list with you to all your follow-up doctor's appointments.

## 2020-09-18 NOTE — Progress Notes (Signed)
Occupational Therapy Session Note  Patient Details  Name: Lindsay Rivera MRN: [id number] Date of Birth: 05-Apr-1960  Today's Date: 09/18/2020 OT Individual Time: 1207-1300 OT Individual Time Calculation (min): 53 min   Short Term Goals: Week 2:  OT Short Term Goal 1 (Week 2): Pt will ambulate into bathroom with CGA OT Short Term Goal 2 (Week 2): Pt will maintain standing for 5 minutes in preparation for BADL tasks OT Short Term Goal 3 (Week 2): Pt will be given UB home exercise program  Skilled Therapeutic Interventions/Progress Updates:    Pt greeted semi-reclined in bed and agreeable to OT treatment session. Pt discussed with OT that her ex husband just passed away causing her some stress because her son is having to take care of a lot of things. Pt reported she has some baseline difficulty swallowing and has to get her esophagus stretched out. Discussed positioning for improved safety with swallow including chin tuck. Discussed dc plan with patient including DME needs and therapy follow up. Pt then agreeable to shower. Pt ambulated with RW and CGA. Pt undressed from bench and bathing completed with supervision. Dressing sit<>stand from wc with CGA for balance when standing to pull up pants. Min verbal cues for safety awareness to sit down and try off feet. Pt left semi-reclined in bed with bed alarm on, call bell in reach, and needs met.   Therapy Documentation Precautions:  Precautions Precautions: Fall Restrictions Weight Bearing Restrictions: No Vital Signs: Therapy Vitals Pulse Rate: 74 Pain: Pain Assessment Pain Scale: 0-10 Pain Score: 5  Pain Type: Acute pain Pain Location: Ear Pain Orientation: Right Pain Descriptors / Indicators: Aching Pain Frequency: Constant Pain Onset: On-going Pain Intervention(s): Repositioned   Therapy/Group: Individual Therapy  Valma Cava 09/18/2020, 12:22 PM

## 2020-09-18 NOTE — Progress Notes (Signed)
Physical Therapy Session Note  Patient Details  Name: [name] MRN: [id number] Date of Birth: [birth year]  Today's Date: 09/18/2020 PT Individual Time: 2376-2831 PT Individual Time Calculation (min): 23 min   Short Term Goals: Week 1:  PT Short Term Goal 1 (Week 1): Pt will perform bed to chair transfer with CGA. PT Short Term Goal 1 - Progress (Week 1): Met PT Short Term Goal 2 (Week 1): Pt will ambulate x150' with CGA. PT Short Term Goal 2 - Progress (Week 1): Progressing toward goal PT Short Term Goal 3 (Week 1): Pt will complete x4 steps with BHRs and CGA. PT Short Term Goal 3 - Progress (Week 1): Progressing toward goal Week 2:  PT Short Term Goal 1 (Week 2): STGs = LTGs  Skilled Therapeutic Interventions/Progress Updates:    Pt sitting in recliner and agreeable to therapy. Pt expresses she is upset 2/2 news of ex husband passing yesterday. Pt consoled and wanting to going outside for gait training on uneven surfaces with addition to fresh air and sun light. STS throughout session CGA-Close supervision. Pt transferred > wc via short walk no AD, CGA-MinA for balance. Pt transported <> outside for time management. Pt ambulated ~39f RW CGA. Verbal cuing for upright posture and increased speed for improved continuous gait pattern. Pt able to improve left quad contraction in stance with verbal cuing. Pt handoff to NT in room. All needs met at this time.   Therapy Documentation Precautions:  Precautions Precautions: Fall Restrictions Weight Bearing Restrictions: No  Pain: Pain Assessment Pain Scale: 0-10 Pain Score: 0-No pain Faces Pain Scale: No hurt Pain Type: Acute pain Pain Location: Ear Pain Descriptors / Indicators: Aching Pain Frequency: Constant Pain Onset: On-going Pain Intervention(s): Medication (See eMAR)  Therapy/Group: Individual Therapy  Bionca Mckey, SPT 09/18/2020, 6:28 PM

## 2020-09-18 NOTE — Progress Notes (Signed)
PROGRESS NOTE   Subjective/Complaints: Very anxious and agitated this morning as her son's father passed away last night, and she/nurse had an interaction this morning which set her off.  Also remarked that she's not tearing from right eye  ROS: Limited due to  behavioral    Objective:   No results found. No results for input(s): WBC, HGB, HCT, PLT in the last 72 hours.  No results for input(s): NA, K, CL, CO2, GLUCOSE, BUN, CREATININE, CALCIUM in the last 72 hours.   Intake/Output Summary (Last 24 hours) at 09/18/2020 1159 Last data filed at 09/18/2020 0716 Gross per 24 hour  Intake 480 ml  Output --  Net 480 ml        Physical Exam: Vital Signs Blood pressure 114/79, pulse 74, temperature 98.6 F (37 C), resp. rate 18, height '5\' 2"'$  (1.575 m), weight 70.4 kg, SpO2 100 %. Constitutional: No distress . Vital signs reviewed. HEENT: NCAT, EOMI, oral membranes moist Neck: supple Cardiovascular: RRR without murmur. No JVD    Respiratory/Chest: CTA Bilaterally without wheezes or rales. Normal effort    GI/Abdomen: BS +, non-tender, non-distended Ext: no clubbing, cyanosis, or edema Psych: anxious, tearful, beside herself initially. Eventually settled down Musc: No edema in extremities.  No tenderness in extremities. Neuro: Alert Sensation diminished to light touch right face and left arm/leg  Motor: Bilateral upper extremities: Grossly intact Right lower extremity: Hip flexion 3/5, knee extension 4/5 Left lower extremity: Hip flexion 3-/5, knee 4/5  Assessment/Plan: 1. Functional deficits which require 3+ hours per day of interdisciplinary therapy in a comprehensive inpatient rehab setting. Physiatrist is providing close team supervision and 24 hour management of active medical problems listed below. Physiatrist and rehab team continue to assess barriers to discharge/monitor patient progress toward functional and  medical goals  Care Tool:  Bathing    Body parts bathed by patient: Right arm, Left arm, Chest, Abdomen, Front perineal area, Buttocks, Right upper leg, Left upper leg, Right lower leg, Left lower leg, Face         Bathing assist Assist Level: Supervision/Verbal cueing     Upper Body Dressing/Undressing Upper body dressing   What is the patient wearing?: Pull over shirt    Upper body assist Assist Level: Set up assist    Lower Body Dressing/Undressing Lower body dressing      What is the patient wearing?: Pants     Lower body assist Assist for lower body dressing: Contact Guard/Touching assist     Toileting Toileting    Toileting assist Assist for toileting: Contact Guard/Touching assist     Transfers Chair/bed transfer  Transfers assist     Chair/bed transfer assist level: Moderate Assistance - Patient 50 - 74%     Locomotion Ambulation   Ambulation assist      Assist level: Contact Guard/Touching assist Assistive device: Walker-standard Max distance: 60   Walk 10 feet activity   Assist     Assist level: Minimal Assistance - Patient > 75% Assistive device: Walker-standard   Walk 50 feet activity   Assist    Assist level: Contact Guard/Touching assist Assistive device: Walker-standard    Walk 150 feet activity  Assist Walk 150 feet activity did not occur: Safety/medical concerns         Walk 10 feet on uneven surface  activity   Assist     Assist level: Maximal Assistance - Patient 25 - 49% Assistive device:  (R hand rail)   Wheelchair     Assist Will patient use wheelchair at discharge?: No Type of Wheelchair: Manual    Wheelchair assist level: Moderate Assistance - Patient 50 - 74% Max wheelchair distance: 50    Wheelchair 50 feet with 2 turns activity    Assist        Assist Level: Moderate Assistance - Patient 50 - 74%   Wheelchair 150 feet activity     Assist          Blood pressure  114/79, pulse 74, temperature 98.6 F (37 C), resp. rate 18, height '5\' 2"'$  (1.575 m), weight 70.4 kg, SpO2 100 %.  Medical Problem List and Plan: 1.  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 Rest of eye/muscles/etc seem to be working appropriately.  2.  Antithrombotics: -DVT/anticoagulation:  Pharmaceutical: Lovenox             -antiplatelet therapy: N/A 3. Headaches/Pain Management:  Continue hydrocodone prn--  -continue gabapentin for neuropathic pain to 400 mg tid   -behavioral mod.  Controlled generally with meds on 8/12 4. Mood: Team to provide ego support. LCSW to follow for evaluation and support.              -antipsychotic agents:  seroquel 5. Neuropsych: This patient is capable of making decisions on her own behalf. 6. Skin/Wound Care: Monitor incision for healing.   -keep facial wound clean  7. Fluids/Electrolytes/Nutrition:   -encourage fluids 8. HTN: Monitor BP tid--continue Norvasc and Maxizide Relatively controlled on 8/11 9. T2DM: Hgb A1c-6.2 on 7/26             --monitor BS ac/hs and use SSI for elevated BS.  Relatively controlled on 8/10 10. H/o bipolar d/o and reported schizophrenia: suicidal ideations Thursday  -improving -increased Prozac to '80mg'$  8/6 -Continue Xanax bid prn w/ atarax prn.  -pt needs ego support -seroquel '50mg'$  bid added to '100mg'$  qhs   -gabapentin '300mg'$  tid---increased to '400mg'$  tid 8/6 -appreciate psychiatry input, counseling -neuropsychology input would also be helpful -steroids weaned off -chaplain consulted again given her family loss -spent time along with nursing helping to reassure her, console 11. Constipation: On Linzess. scheduled miralax    -dulcolax supp prn, enema prn 12. OSA: Has been compliant with CPAP. Will need to order her a  new mask for home.  13. Chronic cough: For past 3-4 months. Continue scheduled Mucinex DM.             --pulmonary hygiene with flutter valve. 14. Cerebral edema: tapering decadron to '1mg'$  daily now 15. Right thigh herpetic lesion: Continue Valcyclovir '500mg'$  daily. 16. Bradycardia: decrease Coreg to daily       LOS:g  11 days A FACE TO FACE EVALUATION WAS PERFORMED  Meredith Staggers 09/18/2020, 11:59 AM

## 2020-09-18 NOTE — Progress Notes (Signed)
Physical Therapy Session Note  Patient Details  Name: Lindsay Rivera MRN: [id number] Date of Birth: 26-Aug-1960  Today's Date: 09/18/2020 PT Individual Time: 1300-1345 PT Individual Time Calculation (min): 45 min   Short Term Goals: Week 2:  PT Short Term Goal 1 (Week 2): STGs = LTGs  Skilled Therapeutic Interventions/Progress Updates: Pt presents in long sitting finishing lunch.  Pt agreeable to therapy, although states fatigue.  Pt scoots to bottom of bed w/ supervision around siderail before PT able to lower siderail for transfer.  Pt transfers sit to stand w/ min A and then to w/c w/ verbal cueing for safety.  Pt wheeled outside for time conservation.  Pt amb multiple trials over uneven surfaces w/ CGA on gait belt and encouragement for arm swing, especially LUE.  Pt has occasional slight buckling of L knee although able to recover balance w/ CGA.  Pt encouraged to amb as fast as feels safe and no need for PT to request slowing down.  Pt amb up to 130' and distractions.  Pt performed forward and then backward walking w/ good BOS noted.  Pt negotiated 4 steps w/ B rails and CGA, initially descending w/ LLE first but then descending w/ R LE first 2/2 numbness of L foot and unable to feel it to safely position.  Pt amb 150' w/ CGA back to room and to bed.  Pt transferred w/ supervision and bed alarm on and all needs in reach.    Therapy Documentation Precautions:  Precautions Precautions: Fall Restrictions Weight Bearing Restrictions: No General:   Vital Signs:   Pain: 7/10 to R side of throat/face, meds given prior to therapy.       Therapy/Group: Individual Therapy  [name] 09/18/2020, 3:28 PM

## 2020-09-18 NOTE — Progress Notes (Signed)
Occupational Therapy Session Note  Patient Details  Name: Lindsay Rivera MRN: [id number] Date of Birth: 1961/01/16  Today's Date: 09/18/2020 OT Individual Time: 6825-7493 OT Individual Time Calculation (min): 24 min    Short Term Goals: Week 2:  OT Short Term Goal 1 (Week 2): Pt will ambulate into bathroom with CGA OT Short Term Goal 2 (Week 2): Pt will maintain standing for 5 minutes in preparation for BADL tasks OT Short Term Goal 3 (Week 2): Pt will be given UB home exercise program  Skilled Therapeutic Interventions/Progress Updates:    Pt received seated in w/c with NT present, initially upset about having more therapy back-to-back, but req to complete morning grooming routine. No c/o pain throughout. Session focus on self-care retraining, activity tolerance, safety education, falls prevention, func t/fs, LUE NMR in prep for improved ADL/IADL/func mobility performance + decreased caregiver burden. Req min Vcs to lock brakes while seated at sink. Related to therapist "oh yeah I forgot to do that and fell twice, I only told them I fell once though." Relates that she forgot to lock brakes while OOB unsupervised and it moved out from under her. Cont education on importance of calling for S in order to move around room, will cont to benefit from further education. Pt able to braid hair, brush teeth, and wash face with BUE and good FMC of L hand. Squat-pivot > bed with S for safety, when prompted pt able to lock B brakes and move foot rests out of the way. Returned to supine close S. Cont education on safety 2/2 current deficits, verbalizes understanding.    Pt left semi-reclined in bed with bed alarm engaged, call bell in reach, and all immediate needs met. Noted to initiate completing theraputty exercises.   Therapy Documentation Precautions:  Precautions Precautions: Fall Restrictions Weight Bearing Restrictions: No  Pain: no c/o ADL: See Care Tool for more details.  Therapy/Group:  Individual Therapy  Volanda Napoleon MS, OTR/L  09/18/2020, 6:49 AM

## 2020-09-18 NOTE — Progress Notes (Signed)
Patient told Chaplain that three calls had been made for Chaplain to come and see her. She has been on the consult list for 2 days. Chaplains were not here yesterday. Chaplain apologized to her for not having a visit. Patient has been through a great deal of adversity with deaths in her family. She is suffering from depression and really wants to go home. She is scheduled to go home on the 18th but she says that she is going home on Tuesday. Chaplain prayed for strength to return and healing for patient's body so that she would be able to go home soon. Chaplain is available when needed.    09/18/20 1130  Clinical Encounter Type  Visited With Patient  Visit Type Spiritual support  Referral From Physician  Consult/Referral To Chaplain  Spiritual Encounters  Spiritual Needs Emotional;Prayer  Stress Factors  Patient Stress Factors Health changes

## 2020-09-18 NOTE — Progress Notes (Signed)
Physical Therapy Session Note  Patient Details  Name: Lindsay Rivera MRN: [id number] Date of Birth: Feb 01, 1961  Today's Date: 09/18/2020 PT Individual Time: 0940-1005 PT Individual Time Calculation (min): 25 min   Short Term Goals: Week 2:  PT Short Term Goal 1 (Week 2): STGs = LTGs  Skilled Therapeutic Interventions/Progress Updates:  Patient sidelying to R side in bed on entrance to room. Patient alert and initially refusing PT session relating pt's ex-husband recently passing away and current feeling of depression. With allowing pt to express feelings and share current state of mind, pt then becoming more amenable to participation in shorter session. No complaint of pain throughout session.  Therapeutic Activity: Bed Mobility: Patient performed supine <> sit with Mod I only requiring extra time to complete for balance. Transfers: Patient performed STS and SPVT transfers throughout session with close supervision with no AD and intermittent use of bedrails. No vc required this session. .  Gait Training:  Patient ambulated 90 feet using no AD with CGA. Demonstrated decreased speed to weight shift onto LLE d/t pt related decreased sensation. Provided vc/ tc for increasing pace which helps to improve weight shift and pause in movement stepping to L side. Pt relates that lead PT also told her that yesterday.   Patient seated in recliner at end of session with brakes locked, belt alarm set, and all needs within reach. Oriented pt to remaining therapy sessions and expected therapists. Pt expresses desire to go outside for at least one session and afternoon PT notified as to pt's wishes.      Therapy Documentation Precautions:  Precautions Precautions: Fall Restrictions Weight Bearing Restrictions: No  Therapy/Group: Individual Therapy  Alger Simons PT, DPT 09/18/2020, 6:08 PM

## 2020-09-19 DIAGNOSIS — D72829 Elevated white blood cell count, unspecified: Secondary | ICD-10-CM

## 2020-09-19 DIAGNOSIS — D72828 Other elevated white blood cell count: Secondary | ICD-10-CM

## 2020-09-19 DIAGNOSIS — R001 Bradycardia, unspecified: Secondary | ICD-10-CM

## 2020-09-19 LAB — GLUCOSE, CAPILLARY
Glucose-Capillary: 101 mg/dL — ABNORMAL HIGH (ref 70–99)
Glucose-Capillary: 109 mg/dL — ABNORMAL HIGH (ref 70–99)
Glucose-Capillary: 123 mg/dL — ABNORMAL HIGH (ref 70–99)
Glucose-Capillary: 121 mg/dL — ABNORMAL HIGH (ref 70–99)

## 2020-09-19 NOTE — Progress Notes (Signed)
PROGRESS NOTE   Subjective/Complaints: Patient seen laying in bed this morning.  She states she slept well overnight.  She notes she has some mild leg pain this morning, but just received medications.  ROS: .Denies CP, SOB, N/V/D  Objective:   No results found. No results for input(s): WBC, HGB, HCT, PLT in the last 72 hours.  No results for input(s): NA, K, CL, CO2, GLUCOSE, BUN, CREATININE, CALCIUM in the last 72 hours.   Intake/Output Summary (Last 24 hours) at 09/19/2020 1411 Last data filed at 09/19/2020 1300 Gross per 24 hour  Intake 360 ml  Output --  Net 360 ml         Physical Exam: Vital Signs Blood pressure (!) 92/58, pulse 63, temperature 98.1 F (36.7 C), temperature source Oral, resp. rate 18, height '5\' 2"'$  (1.575 m), weight 70.4 kg, SpO2 100 %. Constitutional: No distress . Vital signs reviewed. HENT: Normocephalic.  Atraumatic. Eyes: EOMI. No discharge. Cardiovascular: No JVD.  RRR. Respiratory: Normal effort.  No stridor.  Bilateral clear to auscultation. GI: Non-distended.  BS +. Skin: Warm and dry.  Intact. Psych: Normal mood.  Normal behavior. Musc: No edema in extremities.  No tenderness in extremities. Neuro: Alert Motor: Bilateral upper extremities: Grossly intact, unchanged Right lower extremity: Hip flexion 3/5, knee extension 4/5 Left lower extremity: Hip flexion 3-/5, knee 4/5  Assessment/Plan: 1. Functional deficits which require 3+ hours per day of interdisciplinary therapy in a comprehensive inpatient rehab setting. Physiatrist is providing close team supervision and 24 hour management of active medical problems listed below. Physiatrist and rehab team continue to assess barriers to discharge/monitor patient progress toward functional and medical goals  Care Tool:  Bathing    Body parts bathed by patient: Right arm, Left arm, Chest, Abdomen, Front perineal area, Buttocks, Right  upper leg, Left upper leg, Right lower leg, Left lower leg, Face         Bathing assist Assist Level: Supervision/Verbal cueing     Upper Body Dressing/Undressing Upper body dressing   What is the patient wearing?: Pull over shirt    Upper body assist Assist Level: Set up assist    Lower Body Dressing/Undressing Lower body dressing      What is the patient wearing?: Pants     Lower body assist Assist for lower body dressing: Contact Guard/Touching assist     Toileting Toileting    Toileting assist Assist for toileting: Contact Guard/Touching assist     Transfers Chair/bed transfer  Transfers assist     Chair/bed transfer assist level: Minimal Assistance - Patient > 75%     Locomotion Ambulation   Ambulation assist      Assist level: Contact Guard/Touching assist Assistive device: No Device Max distance: 150   Walk 10 feet activity   Assist     Assist level: Contact Guard/Touching assist Assistive device: No Device   Walk 50 feet activity   Assist    Assist level: Contact Guard/Touching assist Assistive device: No Device    Walk 150 feet activity   Assist Walk 150 feet activity did not occur: Safety/medical concerns  Assist level: Contact Guard/Touching assist Assistive device: No Device  Walk 10 feet on uneven surface  activity   Assist     Assist level: Contact Guard/Touching assist Assistive device: Other (comment) (no device)   Wheelchair     Assist Will patient use wheelchair at discharge?: No Type of Wheelchair: Manual    Wheelchair assist level: Moderate Assistance - Patient 50 - 74% Max wheelchair distance: 50    Wheelchair 50 feet with 2 turns activity    Assist        Assist Level: Moderate Assistance - Patient 50 - 74%   Wheelchair 150 feet activity     Assist          Blood pressure (!) 92/58, pulse 63, temperature 98.1 F (36.7 C), temperature source Oral, resp. rate 18, height 5'  2" (1.575 m), weight 70.4 kg, SpO2 100 %.  Medical Problem List and Plan: 1.  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  Antithrombotics: -DVT/anticoagulation:  Pharmaceutical: Lovenox             -antiplatelet therapy: N/A 3. Headaches/Pain Management:  Continue hydrocodone prn--  -continue gabapentin for neuropathic pain to 400 mg tid   -behavioral mod.  Controlled with meds on 8/17 4. Mood: Team to provide ego support. LCSW to follow for evaluation and support.              -antipsychotic agents:  seroquel 5. Neuropsych: This patient is capable of making decisions on her own behalf. 6. Skin/Wound Care: Monitor incision for healing.   -keep facial wound clean  7. Fluids/Electrolytes/Nutrition:   -encourage fluids 8. HTN: Monitor BP tid--continue Norvasc and Maxizide Relatively controlled on 8/13 9. T2DM: Hgb A1c-6.2 on 7/26             --monitor BS ac/hs and use SSI for elevated BS.  Mild elevated on 8/13 10. H/o bipolar d/o and reported schizophrenia: suicidal ideations this visit  -improving -increased Prozac to '80mg'$  8/6 -Continue Xanax bid prn w/ atarax prn.  -pt needs ego support -seroquel '50mg'$  bid added to '100mg'$  qhs   -gabapentin '300mg'$  tid---increased to '400mg'$  tid 8/6 -appreciate psychiatry input, counseling -neuropsychology input would also be helpful -steroids weaned off -chaplain consulted again given her family loss 29. Constipation: On Linzess. scheduled miralax    -dulcolax supp prn, enema prn 12. OSA: Has been compliant with CPAP. Will need to order her a new mask for home.  13. Chronic cough: For past 3-4 months. Continue scheduled Mucinex DM.             --pulmonary hygiene with flutter valve. 14. Cerebral edema: Completed course of Decadron 15. Right thigh herpetic lesion: Continue Valcyclovir '500mg'$  daily. 16. Bradycardia: decrease Coreg to daily  Stable on 8/13 17.  Steroid-induced  leukocytosis  WBCs 12.8 on 8/8, continue to monitor  No signs or symptoms of infection, afebrile      LOS:g  12 days A FACE TO FACE EVALUATION WAS PERFORMED  Lindsay Rivera Lindsay Rivera 09/19/2020, 2:11 PM

## 2020-09-19 NOTE — Progress Notes (Signed)
Occupational Therapy Session Note  Patient Details  Name: ARAIA LEDOUX MRN: [id number] Date of Birth: 09/21/1960  Today's Date: 09/19/2020 OT Missed Time: 57 Minutes Missed Time Reason: Pain;Patient fatigue   Short Term Goals: Week 2:  OT Short Term Goal 1 (Week 2): Pt will ambulate into bathroom with CGA OT Short Term Goal 2 (Week 2): Pt will maintain standing for 5 minutes in preparation for BADL tasks OT Short Term Goal 3 (Week 2): Pt will be given UB home exercise program  Skilled Therapeutic Interventions/Progress Updates:    Pt received asleep in bed. Declining OOB and bed level therapy at this time 2/2 L hip pain and having "done a lot already with therapy today." Provided heat packs per pt request to facilitate decrease in pain. Pt appreciation and req to rest. Pt left as found with call bell and all needs in reach. Pt missed 30 min of scheduled OT 2/2 pain/fatigue.    Therapy Documentation Precautions:  Precautions Precautions: Fall Restrictions Weight Bearing Restrictions: No  Pain: L hip pain, did not rate   ADL: See Care Tool for more details. Therapy/Group: Individual Therapy  Volanda Napoleon MS, OTR/L  09/19/2020, 6:50 AM

## 2020-09-20 LAB — GLUCOSE, CAPILLARY
Glucose-Capillary: 121 mg/dL — ABNORMAL HIGH (ref 70–99)
Glucose-Capillary: 120 mg/dL — ABNORMAL HIGH (ref 70–99)
Glucose-Capillary: 151 mg/dL — ABNORMAL HIGH (ref 70–99)
Glucose-Capillary: 148 mg/dL — ABNORMAL HIGH (ref 70–99)

## 2020-09-20 NOTE — Progress Notes (Signed)
PROGRESS NOTE   Subjective/Complaints: Patient seen sitting up this morning.  She states she slept well overnight.  She notes improving sensation in her face.  ROS: Denies CP, SOB, N/V/D  Objective:   No results found. No results for input(s): WBC, HGB, HCT, PLT in the last 72 hours.  No results for input(s): NA, K, CL, CO2, GLUCOSE, BUN, CREATININE, CALCIUM in the last 72 hours.   Intake/Output Summary (Last 24 hours) at 09/20/2020 1933 Last data filed at 09/20/2020 1304 Gross per 24 hour  Intake 480 ml  Output --  Net 480 ml         Physical Exam: Vital Signs Blood pressure 111/62, pulse 71, temperature 98.2 F (36.8 C), temperature source Oral, resp. rate 18, height '5\' 2"'$  (1.575 m), weight 70.4 kg, SpO2 99 %. Constitutional: No distress . Vital signs reviewed. HENT: Normocephalic.  Atraumatic. Eyes: EOMI. No discharge. Cardiovascular: No JVD.  RRR. Respiratory: Normal effort.  No stridor.  Bilateral clear to auscultation. GI: Non-distended.  BS +. Skin: Warm and dry.  Intact. Psych: Normal mood.  Normal behavior. Musc: No edema in extremities.  No tenderness in extremities. Neuro: Alert Motor: Bilateral upper extremities: Grossly intact, stable Right lower extremity: Hip flexion 3/5, knee extension 4/5, stable Left lower extremity: Hip flexion 3-/5, knee 4/5  Assessment/Plan: 1. Functional deficits which require 3+ hours per day of interdisciplinary therapy in a comprehensive inpatient rehab setting. Physiatrist is providing close team supervision and 24 hour management of active medical problems listed below. Physiatrist and rehab team continue to assess barriers to discharge/monitor patient progress toward functional and medical goals  Care Tool:  Bathing    Body parts bathed by patient: Right arm, Left arm, Chest, Abdomen, Front perineal area, Buttocks, Right upper leg, Left upper leg, Right lower leg,  Left lower leg, Face         Bathing assist Assist Level: Supervision/Verbal cueing     Upper Body Dressing/Undressing Upper body dressing   What is the patient wearing?: Pull over shirt    Upper body assist Assist Level: Set up assist    Lower Body Dressing/Undressing Lower body dressing      What is the patient wearing?: Pants     Lower body assist Assist for lower body dressing: Contact Guard/Touching assist     Toileting Toileting    Toileting assist Assist for toileting: Contact Guard/Touching assist     Transfers Chair/bed transfer  Transfers assist     Chair/bed transfer assist level: Minimal Assistance - Patient > 75%     Locomotion Ambulation   Ambulation assist      Assist level: Moderate Assistance - Patient 50 - 74% Assistive device: No Device Max distance: 180   Walk 10 feet activity   Assist     Assist level: Moderate Assistance - Patient - 50 - 74% Assistive device: No Device   Walk 50 feet activity   Assist    Assist level: Moderate Assistance - Patient - 50 - 74% Assistive device: No Device    Walk 150 feet activity   Assist Walk 150 feet activity did not occur: Safety/medical concerns  Assist level: Moderate Assistance -  Patient - 50 - 74% Assistive device: No Device    Walk 10 feet on uneven surface  activity   Assist     Assist level: Contact Guard/Touching assist Assistive device: Other (comment) (no device)   Wheelchair     Assist Will patient use wheelchair at discharge?: No Type of Wheelchair: Manual    Wheelchair assist level: Moderate Assistance - Patient 50 - 74% Max wheelchair distance: 50    Wheelchair 50 feet with 2 turns activity    Assist        Assist Level: Moderate Assistance - Patient 50 - 74%   Wheelchair 150 feet activity     Assist          Blood pressure 111/62, pulse 71, temperature 98.2 F (36.8 C), temperature source Oral, resp. rate 18, height '5\' 2"'$   (1.575 m), weight 70.4 kg, SpO2 99 %.  Medical Problem List and Plan: 1.  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  Antithrombotics: -DVT/anticoagulation:  Pharmaceutical: Lovenox             -antiplatelet therapy: N/A 3. Headaches/Pain Management:  Continue hydrocodone prn--  -continue gabapentin for neuropathic pain to 400 mg tid   -behavioral mod.  Controlled with meds on 8/14 4. Mood: Team to provide ego support. LCSW to follow for evaluation and support.              -antipsychotic agents:  seroquel 5. Neuropsych: This patient is capable of making decisions on her own behalf. 6. Skin/Wound Care: Monitor incision for healing.   -keep facial wound clean  7. Fluids/Electrolytes/Nutrition:   -encourage fluids 8. HTN: Monitor BP tid--continue Norvasc and Maxizide Relatively controlled on 8/14 9. T2DM: Hgb A1c-6.2 on 7/26             --monitor BS ac/hs and use SSI for elevated BS.  Slightly labile on 8/14, monitor for trend 10. H/o bipolar d/o and reported schizophrenia: suicidal ideations this visit  -improving -increased Prozac to '80mg'$  8/6 -Continue Xanax bid prn w/ atarax prn.  -pt needs ego support -seroquel '50mg'$  bid added to '100mg'$  qhs   -gabapentin '300mg'$  tid---increased to '400mg'$  tid 8/6 -appreciate psychiatry input, counseling -neuropsychology input would also be helpful -steroids weaned off -chaplain consulted again given her family loss 58. Constipation: On Linzess. scheduled miralax    -dulcolax supp prn, enema prn 12. OSA: Has been compliant with CPAP. Will need to order her a new mask for home.  13. Chronic cough: For past 3-4 months. Continue scheduled Mucinex DM.             --pulmonary hygiene with flutter valve. 14. Cerebral edema: Completed course of Decadron 15. Right thigh herpetic lesion: Continue Valcyclovir '500mg'$  daily. 16. Bradycardia: decrease Coreg to daily  Stable on 8/13 17.   Steroid-induced leukocytosis  WBCs 20.8 on 8/8, labs ordered for tomorrow  No signs or symptoms of infection, afebrile      LOS:g  13 days A FACE TO FACE EVALUATION WAS PERFORMED  Lindsay Rivera Lindsay Rivera 09/20/2020, 7:33 PM

## 2020-09-20 NOTE — Progress Notes (Signed)
Occupational Therapy Session Note  Patient Details  Name: Lindsay Rivera MRN: [id number] Date of Birth: Dec 28, 1960  Today's Date: 09/20/2020 OT Individual Time: 0930-1030 OT Individual Time Calculation (min): 60 min    Short Term Goals: Week 2:  OT Short Term Goal 1 (Week 2): Pt will ambulate into bathroom with CGA OT Short Term Goal 2 (Week 2): Pt will maintain standing for 5 minutes in preparation for BADL tasks OT Short Term Goal 3 (Week 2): Pt will be given UB home exercise program   Skilled Therapeutic Interventions/Progress Updates:    Pt received long sitting in bed, 7/10 pain in the R side of her face, described as nerve pain, reporting she is premedicated. Pt requesting to go outside and she was transported via w/c with total A. Seated pt completed BUE strengthening circuit using a 3lb dowel bilaterally for L NMR and BUE coordination. Pt openly discussing grief of losing ex-husband throughout session, emotional support provided. Pt stood with CGA and completed functional reaching tasks reciprocally to challenge dynamic standing balance and UE coordination. Occasional L knee buckling but no LOB and CGA provided throughout. Discussed return to driving and pt stating "I can definitely drive again", encouraged pt to refer to MD but likely will not be cleared right away.  Returned inside and pt completed Nustep for 8 min with a 1 min break required after 3 min to improve functional activity tolerance and BUE/BLE coordination. Pt returned to her room and was left sitting up with all needs met, chair alarm set.   Therapy Documentation Precautions:  Precautions Precautions: Fall Restrictions Weight Bearing Restrictions: No  Therapy/Group: Individual Therapy  Curtis Sites 09/20/2020, 7:26 AM

## 2020-09-20 NOTE — Progress Notes (Signed)
Physical Therapy Session Note  Patient Details  Name: Lindsay Rivera MRN: [id number] Date of Birth: 07-18-1960  Today's Date: 09/20/2020 PT Individual Time: 1310-1350 PT Individual Time Calculation (min): 40 min   Short Term Goals: Week 2:  PT Short Term Goal 1 (Week 2): STGs = LTGs  Skilled Therapeutic Interventions/Progress Updates:  Pt resting in bed.  She rated R facial /head pain, parasthesias, 8/10, and requested meds at end of session.    Stand pivot transfer bed> wc with min assist.  PT transported pt outdoors via wc.  Outdoors, gait training on sloping uneven pavers, x 180' with min/mod assist due to L knee buckling x 1, and mild unsteadiness throughout.  neuromuscular re-education in standing with bil UE support, via visual feedback, demo, multimodal cues for L knee stance stability, L HS curls, bil heel/toe raises with 2 second hold.   Wc> transfer squat pivot to R without use of railing.  Bed alarm set and needs left at hand at end of session.      Therapy Documentation Precautions:  Precautions Precautions: Fall Restrictions Weight Bearing Restrictions: No General: PT Amount of Missed Time (min): 5 Minutes PT Missed Treatment Reason: Toileting        Therapy/Group: Individual Therapy  Bashar Milam 09/20/2020, 5:08 PM

## 2020-09-21 LAB — BASIC METABOLIC PANEL
Anion gap: 8 (ref 5–15)
BUN: 15 mg/dL (ref 6–20)
CO2: 26 mmol/L (ref 22–32)
Calcium: 8.6 mg/dL — ABNORMAL LOW (ref 8.9–10.3)
Chloride: 105 mmol/L (ref 98–111)
Creatinine, Ser: 0.74 mg/dL (ref 0.44–1.00)
GFR, Estimated: >60 mL/min (ref >60)
Glucose, Bld: 140 mg/dL — ABNORMAL HIGH (ref 70–99)
Potassium: 3.8 mmol/L (ref 3.5–5.1)
Sodium: 139 mmol/L (ref 135–145)

## 2020-09-21 LAB — CBC
HCT: 35.8 % — ABNORMAL LOW (ref 36.0–46.0)
Hemoglobin: 11.4 g/dL — ABNORMAL LOW (ref 12.0–15.0)
MCH: 29.3 pg (ref 26.0–34.0)
MCHC: 31.8 g/dL (ref 30.0–36.0)
MCV: 92 fL (ref 80.0–100.0)
Platelets: 306 10*3/uL (ref 150–400)
RBC: 3.89 MIL/uL (ref 3.87–5.11)
RDW: 17.3 % — ABNORMAL HIGH (ref 11.5–15.5)
WBC: 10.4 10*3/uL (ref 4.0–10.5)
nRBC: 0.2 % (ref 0.0–0.2)

## 2020-09-21 LAB — GLUCOSE, CAPILLARY
Glucose-Capillary: 124 mg/dL — ABNORMAL HIGH (ref 70–99)
Glucose-Capillary: 135 mg/dL — ABNORMAL HIGH (ref 70–99)
Glucose-Capillary: 116 mg/dL — ABNORMAL HIGH (ref 70–99)
Glucose-Capillary: 128 mg/dL — ABNORMAL HIGH (ref 70–99)

## 2020-09-21 MED ORDER — PHENOL 1.4 % MT LIQD
1.0000 | OROMUCOSAL | Status: DC | PRN
  Administered 2020-09-21: 1 via OROMUCOSAL
  Filled 2020-09-21: qty 177

## 2020-09-21 MED ORDER — BIOTENE DRY MOUTH MT LIQD: 15.0000 mL | OROMUCOSAL | Status: DC | PRN

## 2020-09-21 NOTE — Progress Notes (Signed)
Occupational Therapy Weekly Progress Note  Patient Details  Name: Lindsay Rivera MRN: [id number] Date of Birth: [birth year]  Beginning of progress report period: September 08, 2020 End of progress report period: September 21, 2020  Today's Date: 09/21/2020 OT Individual Time: 1120-1300 OT Individual Time Calculation (min): 100 min    Patient has met 3 of 3 short term goals.  Patient is making great progress towards OT goals. L UE and LLE have improved greatly. Patient has progressed well with balance and functional use of L UE within BADL tasks. Pt is at an overall CGA/supervision level for functional tasks. She still has intermittent LOB and some decreased safety awareness, but all much improved since eval.  Patient continues to demonstrate the following deficits: muscle weakness, decreased cardiorespiratoy endurance, and decreased postural control and decreased balance strategies and therefore will continue to benefit from skilled OT intervention to enhance overall performance with BADL.  Patient progressing toward long term goals..  Continue plan of care.  OT Short Term Goals Week 2:  OT Short Term Goal 1 (Week 2): Pt will ambulate into bathroom with CGA OT Short Term Goal 1 - Progress (Week 2): Met OT Short Term Goal 2 (Week 2): Pt will maintain standing for 5 minutes in preparation for BADL tasks OT Short Term Goal 2 - Progress (Week 2): Met OT Short Term Goal 3 (Week 2): Pt will be given UB home exercise program OT Short Term Goal 3 - Progress (Week 2): Met Week 3:  OT Short Term Goal 1 (Week 3): LTG=STG 2/2 ELOS  Skilled Therapeutic Interventions/Progress Updates:    Pt greeted semi-reclined in bed and agreeable to OT treatment session. Pt completed bed mobility mod I. Pt then pivoted to wc with supervision. Worked on [employer] with wc propulsion to the elevators. Pt brought outside and worked on L hand fine motor control with theraputty and beads. Functional ambulation on  uneven surfaces using RW. OT educated on use of RW at home for safety especially with pt experiencing intermittent knee buckling on L LE. Pt needed overall CGA/supervision when ambulating with RW. LB there-ex with 3 sets of 10 squats and knee extension. Pt returned to room and completed bathing/dressing from shower level with overall supervision, but CGA when ambulating out of shower. Min cues for safety. Dressing from recliner with supervision. Pt given home UB exercise program and orange theraband. Pt completed 10 reps of 7 exercises. OT also issued home fine motor program and theraputty exercise program-reviewed these with patient. Pt left semi-reclined in bed with lunch tray present and needs met.   Therapy Documentation Precautions:  Precautions Precautions: Fall Restrictions Weight Bearing Restrictions: No Pain: Pain Assessment Pain Scale: 0-10 Pain Score: 6  Headache, rest and repositioned    Therapy/Group: Individual Therapy  Valma Cava 09/21/2020, 12:39 PM

## 2020-09-21 NOTE — Progress Notes (Signed)
Patient ID: Lindsay Rivera, female   DOB: 1960-03-19, 60 y.o.   MRN: [id number]  SW ordered DME: 3in1 BSC and TTB with [place] via parachute.    Loralee Pacas, MSW, [place] Office: [phone] Cell: [phone] Fax: [phone]

## 2020-09-21 NOTE — Plan of Care (Signed)
  Problem: Consults Goal: RH BRAIN INJURY PATIENT EDUCATION Description: Description: See Patient Education module for eduction specifics Outcome: Progressing Goal: Skin Care Protocol Initiated - if Braden Score 18 or less Description: If consults are not indicated, leave blank or document N/A Outcome: Progressing Goal: Diabetes Guidelines if Diabetic/Glucose > 140 Description: If diabetic or lab glucose is > 140 mg/dl - Initiate Diabetes/Hyperglycemia Guidelines & Document Interventions  Outcome: Progressing   Problem: RH BOWEL ELIMINATION Goal: RH STG MANAGE BOWEL WITH ASSISTANCE Description: STG Manage Bowel with Supervision Assistance. Outcome: Progressing   Problem: RH SKIN INTEGRITY Goal: RH STG ABLE TO PERFORM INCISION/WOUND CARE W/ASSISTANCE Description: STG Able To Perform Incision/Wound Care With Supervision Assistance. Outcome: Progressing   Problem: RH SAFETY Goal: RH STG ADHERE TO SAFETY PRECAUTIONS W/ASSISTANCE/DEVICE Description: STG Adhere to Safety Precautions With Cues and Reminders. Outcome: Progressing   Problem: RH COGNITION-NURSING Goal: RH STG USES MEMORY AIDS/STRATEGIES W/ASSIST TO PROBLEM SOLVE Description: STG Uses Memory Aids/Strategies With Cues and Reminders to Problem Solve. Outcome: Progressing   Problem: RH PAIN MANAGEMENT Goal: RH STG PAIN MANAGED AT OR BELOW PT'S PAIN GOAL Description: < 4 on a 0-10 pain scale. Outcome: Progressing   Problem: RH KNOWLEDGE DEFICIT BRAIN INJURY Goal: RH STG INCREASE KNOWLEDGE OF SELF CARE AFTER BRAIN INJURY Description: Patient will demonstrate knowledge of medication management, pain management, skin/wound care with educational materials and handouts provided by staff independently at discharge. Outcome: Progressing

## 2020-09-21 NOTE — Progress Notes (Signed)
Physical Therapy Session Note  Patient Details  Name: Lindsay Rivera MRN: [id number] Date of Birth: 07/26/1960  Today's Date: 09/21/2020 PT Individual Time: 1431-1511 PT Individual Time Calculation (min): 40 min  PT missed time (min): 20 min   Short Term Goals: Week 1:  PT Short Term Goal 1 (Week 1): Pt will perform bed to chair transfer with CGA. PT Short Term Goal 1 - Progress (Week 1): Met PT Short Term Goal 2 (Week 1): Pt will ambulate x150' with CGA. PT Short Term Goal 2 - Progress (Week 1): Progressing toward goal PT Short Term Goal 3 (Week 1): Pt will complete x4 steps with BHRs and CGA. PT Short Term Goal 3 - Progress (Week 1): Progressing toward goal Week 2:  PT Short Term Goal 1 (Week 2): STGs = LTGs  Skilled Therapeutic Interventions/Progress Updates:  Pt received supine in bed w/son present in room. Declined therapy and after lots of encouragement, was agreeable to bed-level exercises. Pt had received pain meds prior to session that she reported made her drowsy. Pt very worried that her CSW had not spoken to her son regarding equipment to be ordered upon DC, communicated with CSW but she was gone for the day. Created a HEP for improved LE and core and quad strength that included:  -Glute bridges w/3s isometric hold x15 -Clamshells w/orange band x15 per side -Dead bugs x10  -Seated HS curls x15 per side   Pt requested to end session early 2/2 pain in L hip and stated she "did too much and made the pain worse". Pt declined pain meds, ice pack or ambulation for pain management and was left supine in bed with all needs in reach. Missed 20 minutes of skilled PT due to pt refusal.   Therapy Documentation Precautions:  Precautions Precautions: Fall Restrictions Weight Bearing Restrictions: No   Therapy/Group: Individual Therapy [name] [name], PT, DPT  09/21/2020, 2:40 PM

## 2020-09-21 NOTE — Progress Notes (Signed)
PROGRESS NOTE   Subjective/Complaints: Pt in bed. More relaxed. Pain better on left side. Feels less numbness more pain on right face. Tongue feels like it has sores on it. Drinking fluids of any kind can irritate  ROS: Patient denies fever, rash, sore throat, blurred vision, nausea, vomiting, diarrhea, cough, shortness of breath or chest pain, , headache, or mood change.   Objective:   No results found. No results for input(s): WBC, HGB, HCT, PLT in the last 72 hours.  Recent Labs    09/21/20 0643  NA 139  K 3.8  CL 105  CO2 26  GLUCOSE 140*  BUN 15  CREATININE 0.74  CALCIUM 8.6*     Intake/Output Summary (Last 24 hours) at 09/21/2020 1125 Last data filed at 09/20/2020 1304 Gross per 24 hour  Intake 240 ml  Output --  Net 240 ml        Physical Exam: Vital Signs Blood pressure 114/77, pulse 75, temperature 98.2 F (36.8 C), temperature source Oral, resp. rate 17, height '5\' 2"'$  (1.575 m), weight 70.4 kg, SpO2 100 %. Constitutional: No distress . Vital signs reviewed. HEENT: NCAT, EOMI, oral membranes dry, scatter small blisters over tongue Neck: supple Cardiovascular: RRR without murmur. No JVD    Respiratory/Chest: CTA Bilaterally without wheezes or rales. Normal effort    GI/Abdomen: BS +, non-tender, non-distended Ext: no clubbing, cyanosis, or edema Psych: pleasant and cooperative, less anxious Skin: Warm and dry.  Intact.  .surgical wound healed.  Musc: No edema in extremities.  No tenderness in extremities. Neuro: Alert Motor: Bilateral upper extremities: Grossly intact, stable Right lower extremity: Hip flexion 3/5, knee extension 4/5, stable Left lower extremity: Hip flexion 3-/5, knee 4/5  Assessment/Plan: 1. Functional deficits which require 3+ hours per day of interdisciplinary therapy in a comprehensive inpatient rehab setting. Physiatrist is providing close team supervision and 24 hour  management of active medical problems listed below. Physiatrist and rehab team continue to assess barriers to discharge/monitor patient progress toward functional and medical goals  Care Tool:  Bathing    Body parts bathed by patient: Right arm, Left arm, Chest, Abdomen, Front perineal area, Buttocks, Right upper leg, Left upper leg, Right lower leg, Left lower leg, Face         Bathing assist Assist Level: Supervision/Verbal cueing     Upper Body Dressing/Undressing Upper body dressing   What is the patient wearing?: Pull over shirt    Upper body assist Assist Level: Set up assist    Lower Body Dressing/Undressing Lower body dressing      What is the patient wearing?: Pants     Lower body assist Assist for lower body dressing: Contact Guard/Touching assist     Toileting Toileting    Toileting assist Assist for toileting: Contact Guard/Touching assist     Transfers Chair/bed transfer  Transfers assist     Chair/bed transfer assist level: Minimal Assistance - Patient > 75%     Locomotion Ambulation   Ambulation assist      Assist level: Moderate Assistance - Patient 50 - 74% Assistive device: No Device Max distance: 180   Walk 10 feet activity   Assist  Assist level: Moderate Assistance - Patient - 50 - 74% Assistive device: No Device   Walk 50 feet activity   Assist    Assist level: Moderate Assistance - Patient - 50 - 74% Assistive device: No Device    Walk 150 feet activity   Assist Walk 150 feet activity did not occur: Safety/medical concerns  Assist level: Moderate Assistance - Patient - 50 - 74% Assistive device: No Device    Walk 10 feet on uneven surface  activity   Assist     Assist level: Contact Guard/Touching assist Assistive device: Other (comment) (no device)   Wheelchair     Assist Will patient use wheelchair at discharge?: No Type of Wheelchair: Manual    Wheelchair assist level: Moderate  Assistance - Patient 50 - 74% Max wheelchair distance: 50    Wheelchair 50 feet with 2 turns activity    Assist        Assist Level: Moderate Assistance - Patient 50 - 74%   Wheelchair 150 feet activity     Assist          Blood pressure 114/77, pulse 75, temperature 98.2 F (36.8 C), temperature source Oral, resp. rate 17, height '5\' 2"'$  (1.575 m), weight 70.4 kg, SpO2 100 %.  Medical Problem List and Plan: 1.  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  Antithrombotics: -DVT/anticoagulation:  Pharmaceutical: Lovenox             -antiplatelet therapy: N/A 3. Headaches/Pain Management:  Continue hydrocodone prn--  -continue gabapentin for neuropathic pain to 400 mg tid   -behavioral mod.  Controlled with meds on 8/15 4. Mood: Team to provide ego support. LCSW to follow for evaluation and support.              -antipsychotic agents:  seroquel 5. Neuropsych: This patient is capable of making decisions on her own behalf. 6. Skin/Wound Care: Monitor incision for healing.   -facial wounds healing nicely  7. Fluids/Electrolytes/Nutrition:   -encourage fluids 8. HTN: Monitor BP tid--continue Norvasc and Maxizide Relatively controlled on 8/14 9. T2DM: Hgb A1c-6.2 on 7/26             --monitor BS ac/hs and use SSI for elevated BS.  Slightly labile on 8/15  10. H/o bipolar d/o and reported schizophrenia: suicidal ideations this visit  -improving -increased Prozac to '80mg'$  8/6 -Continue Xanax bid prn w/ atarax prn.  -pt needs ego support -seroquel '50mg'$  bid added to '100mg'$  qhs   -gabapentin '300mg'$  tid---increased to '400mg'$  tid 8/6 -appreciate psychiatry input, counseling -neuropsychology input would also be helpful -steroids weaned off -chaplain consulted again given her family loss -8/15 mood improved 11. Constipation: On Linzess. scheduled miralax    -dulcolax supp prn, enema prn 12. OSA:  Has been compliant with CPAP. Will need to order her a new mask for home.  13. Chronic cough: For past 3-4 months. Continue scheduled Mucinex DM.             --pulmonary hygiene with flutter valve. 14. Cerebral edema: Completed course of Decadron 15. Right thigh herpetic lesion: Continue Valcyclovir '500mg'$  daily. 16. Bradycardia: decrease Coreg to daily  Stable on 8/13 17.  Steroid-induced leukocytosis  WBCs 20.8 on 8/8--10.4 8/15  No signs or symptoms of infection, afebrile  18. Oral discomfort:   -likely from very dry mucosa  -add biotene and chloraseptic spray     LOS:g  14 days A  FACE TO FACE EVALUATION WAS PERFORMED  Meredith Staggers 09/21/2020, 11:25 AM

## 2020-09-21 NOTE — Progress Notes (Signed)
RT note. Patient placed on home cpap for the night.

## 2020-09-22 LAB — GLUCOSE, CAPILLARY
Glucose-Capillary: 155 mg/dL — ABNORMAL HIGH (ref 70–99)
Glucose-Capillary: 118 mg/dL — ABNORMAL HIGH (ref 70–99)
Glucose-Capillary: 149 mg/dL — ABNORMAL HIGH (ref 70–99)
Glucose-Capillary: 184 mg/dL — ABNORMAL HIGH (ref 70–99)

## 2020-09-22 MED ORDER — BIOTENE DRY MOUTH MT LIQD
15.0000 mL | Freq: Three times a day (TID) | OROMUCOSAL | Status: DC
  Administered 2020-09-22 – 2020-09-24 (×6): 15 mL via OROMUCOSAL

## 2020-09-22 MED ORDER — NAPHAZOLINE-GLYCERIN 0.012-0.25 % OP SOLN
1.0000 [drp] | Freq: Four times a day (QID) | OPHTHALMIC | Status: DC | PRN
Start: 2020-09-22 — End: 2020-09-23
  Filled 2020-09-22: qty 15

## 2020-09-22 NOTE — Patient Care Conference (Signed)
Inpatient RehabilitationTeam Conference and Plan of Care Update Date: 09/22/2020   Time: 10:36 AM    Patient Name: Lindsay Rivera      Medical Record Number: [id number]  Date of Birth: [birth year] Sex: Female         Room/Bed: 4W13C/4W13C-01 Payor Info: Payor: [employer] park [employer] MEDICARE / Plan: Charmian Muff DUAL COMPLETE / Product Type: *No Product type* /    Admit Date/Time:  09/07/2020  5:06 PM  Primary Diagnosis:  Meningioma [facility])  Hospital Problems: Principal Problem:   Meningioma ([place]) Active Problems:   S/P resection of meningioma   Bipolar 1 disorder, mixed, moderate ([place])   Herpes zoster without complication   Controlled type 2 diabetes mellitus with complication, without long-term current use of insulin ([place])   Leucocytosis   Bradycardia    Expected Discharge Date: Expected Discharge Date: 09/24/20  Team Members Present: Physician leading conference: Dr. Alger Simons Social Worker Present: Loralee Pacas, [place] Nurse Present: Dorthula Nettles, RN PT Present: Tereasa Coop, PT OT Present: Laverle Hobby, OT SLP Present: Weston Anna, SLP PPS Coordinator present : Gunnar Fusi, SLP     Current Status/Progress Goal Weekly Team Focus  Bowel/Bladder   continent b/b  regular bowel movement      Swallow/Nutrition/ Hydration             ADL's   CGA overall  Supervision  L NMR, dc planning, fine motor coordination, safety awareness, self-care retraining   Mobility   independent bed mobility, CGA/minA ambulation >500'  supervision, mod(I)  DC prep   Communication             Safety/Cognition/ Behavioral Observations            Pain   headaches improving  pain<3/10  assess pain  q 4hr and prn   Skin   surgical incision right head  healing of incision  assess skin q shift and prn     Discharge Planning:  Pt will go home with her son and his girlfriend at time of discharge. Pt will have intermittent support since son and his griflriend work (7am-6pm).  Plans to work on trying to have other family support while she is home. Pt needs to be Mod I or Intermittent supervision at d/c. Fam edu completed on 8/15 9am-12pm with pt son.   Team Discussion: Pain from tumor, numbness of face, lack of tearing. Will need to use eyedrops for a while, until issues resolves. Continent B/B, pain is improving. Contact guard/supervision overall. Contact guard assist but presentation fluctuates. Patient on target to meet rehab goals: yes  *See Care Plan and progress notes for long and short-term goals.   Revisions to Treatment Plan:  Not at this time.  Teaching Needs: Family education, medication management, pain management, skin/wound care, transfer training, gait training, balance training, endurance training, safety awareness.  Current Barriers to Discharge: Decreased caregiver support, Medical stability, Home enviroment access/layout, Wound care, Lack of/limited family support, Medication compliance, and Behavior  Possible Resolutions to Barriers: Continue current medications, provide emotional support.     Medical Summary Current Status: improving pain control and mood. still experiencing numbness on left side body and right face.  Barriers to Discharge: Medical stability;Behavior   Possible Resolutions to [employer]: continued ego support, med mgt. daily assessment of labs   Continued Need for Acute Rehabilitation Level of Care: The patient requires daily medical management by a physician with specialized training in physical medicine and rehabilitation for the following reasons: Direction of a multidisciplinary  physical rehabilitation program to maximize functional independence : Yes Medical management of patient stability for increased activity during participation in an intensive rehabilitation regime.: Yes Analysis of laboratory values and/or radiology reports with any subsequent need for medication adjustment and/or medical  intervention. : Yes   I attest that I was present, lead the team conference, and concur with the assessment and plan of the team.   Cristi Loron 09/22/2020, 3:07 PM

## 2020-09-22 NOTE — Progress Notes (Signed)
Patient ID: Lindsay Rivera, female   DOB: [birth year], 60 y.o.   MRN: [id number]  SW met with pt in room to provide updates from team conference on gains made, and d/c date now 8/18. SW provided pt with PCS list and HHA list. SW to follow-up with pt to discuss preference.   Loralee Pacas, MSW, [place] Office: [phone] Cell: [phone] Fax: [phone]

## 2020-09-22 NOTE — Progress Notes (Signed)
RT note. Patient placed on home unit for the night.

## 2020-09-22 NOTE — Progress Notes (Signed)
Occupational Therapy Session Note  Patient Details  Name: Lindsay Rivera MRN: [id number] Date of Birth: April 08, 1960  Today's Date: 09/22/2020 OT Missed Time: 56 Minutes Missed Time Reason: Patient unwilling/refused to participate without medical reason;Patient fatigue   Short Term Goals: Week 3:  OT Short Term Goal 1 (Week 3): LTG=STG 2/2 ELOS  Skilled Therapeutic Interventions/Progress Updates:     Pt received semi-reclined in bed. Reports she had just returned from therapy and adamantly declining room level ADL or OOB activity upon therapist entry. Will notify schedulers to allow breaks between therapy per pt request. Pt left semi-reclined in bed with bed alarm engaged, call bell in reach, and all immediate needs met.   Therapy Documentation Precautions:  Precautions Precautions: Fall Restrictions Weight Bearing Restrictions: No  Pain: no s/sx ADL: See Care Tool for more details.  Volanda Napoleon MS, OTR/L   09/22/2020, 6:39 AM

## 2020-09-22 NOTE — Plan of Care (Signed)
  Problem: Consults Goal: RH BRAIN INJURY PATIENT EDUCATION Description: Description: See Patient Education module for eduction specifics Outcome: Progressing Goal: Skin Care Protocol Initiated - if Braden Score 18 or less Description: If consults are not indicated, leave blank or document N/A Outcome: Progressing Goal: Diabetes Guidelines if Diabetic/Glucose > 140 Description: If diabetic or lab glucose is > 140 mg/dl - Initiate Diabetes/Hyperglycemia Guidelines & Document Interventions  Outcome: Progressing   Problem: RH BOWEL ELIMINATION Goal: RH STG MANAGE BOWEL WITH ASSISTANCE Description: STG Manage Bowel with Supervision Assistance. Outcome: Progressing   Problem: RH SKIN INTEGRITY Goal: RH STG ABLE TO PERFORM INCISION/WOUND CARE W/ASSISTANCE Description: STG Able To Perform Incision/Wound Care With Supervision Assistance. Outcome: Progressing   Problem: RH SAFETY Goal: RH STG ADHERE TO SAFETY PRECAUTIONS W/ASSISTANCE/DEVICE Description: STG Adhere to Safety Precautions With Cues and Reminders. Outcome: Progressing   Problem: RH COGNITION-NURSING Goal: RH STG USES MEMORY AIDS/STRATEGIES W/ASSIST TO PROBLEM SOLVE Description: STG Uses Memory Aids/Strategies With Cues and Reminders to Problem Solve. Outcome: Progressing   Problem: RH PAIN MANAGEMENT Goal: RH STG PAIN MANAGED AT OR BELOW PT'S PAIN GOAL Description: < 4 on a 0-10 pain scale. Outcome: Progressing   Problem: RH KNOWLEDGE DEFICIT BRAIN INJURY Goal: RH STG INCREASE KNOWLEDGE OF SELF CARE AFTER BRAIN INJURY Description: Patient will demonstrate knowledge of medication management, pain management, skin/wound care with educational materials and handouts provided by staff independently at discharge. Outcome: Progressing

## 2020-09-22 NOTE — Progress Notes (Signed)
Physical Therapy Session Note  Patient Details  Name: Lindsay Rivera MRN: [id number] Date of Birth: 02/28/1960  Today's Date: 09/22/2020 PT Individual Time: 1500-1525 PT Individual Time Calculation (min): 25 min  and Today's Date: 09/22/2020 PT Missed Time: 60 Minutes Missed Time Reason: Patient unwilling to participate  Short Term Goals: Week 2:  PT Short Term Goal 1 (Week 2): STGs = LTGs  Skilled Therapeutic Interventions/Progress Updates:     1st Session: Pt refuses due to visiting with cousin from [place]. PT encourages pt to participate but pt still refuses. Will follow up as able.  2nd Session: pt received supine in bed and agrees to therapy. No complaint of pain. Bed mobility independent. Pt performs ambulatory transfer to toilet with CGA and "furniture walking". Pt independent of pericare and ambulates to sink for washing hands with CGA. Following, pt ambulates x300' with CGA and cues for upright gaze to improve posture and balance, increasing R stride length to promote increased stance time on L, and increased trunk rotation to decrease risk for falls. PT educates pt on need to use assistive device at this time due to fall risk. Pt verbalizes understanding and agreeable to trial of rollator. PT demos parts and management of rollator. Pt then stands and ambulates 150' x3 with rollator and close supervision, with cues to decrease WB through rollator for energy conservation and safety. Pt practices engaging brakes to turn and rest on rollator multiple reps with supervision. Sit to supine independently. Left with alarm intact and all needs within reach.  Therapy Documentation Precautions:  Precautions Precautions: Fall Restrictions Weight Bearing Restrictions: No      Therapy/Group: Individual Therapy  Breck Coons, PT, DPT 09/22/2020, 3:30 PM

## 2020-09-22 NOTE — Progress Notes (Signed)
Occupational Therapy Session Note  Patient Details  Name: Lindsay Rivera MRN: [id number] Date of Birth: 1960-05-08  Today's Date: 09/22/2020 OT Individual Time: [id number] OT Individual Time Calculation (min): 27 min    Short Term Goals: Week 3:  OT Short Term Goal 1 (Week 3): LTG=STG 2/2 ELOS  Skilled Therapeutic Interventions/Progress Updates:  Pt greeted from bathroom having just finished voiding bladder. Pt completed ambulatory transfer to w/c with no AD and HHA MIN A. Pt transported to ADL apartment with total A for time mgmt. Pt completed functional mobility in ADL apartment demonstrating CGA overall with RW to transfer to couch and bed, pt completed bed mobility independently. Pt able to ambulate into kitchen with RW and MIN A ( one moment of LLE buckling needing MIN A to recover).  Pt able to retrieve food items from refrigerator and place in microwave with CGA, discussed use of walker bag to transport items and using lids for cups and food items. Pt noted to be actively voiding from bowels during ambulation, pt able to make it toilet for BM. Provided pt with new pants and underwear with pt completing LB dressing and bathing  with set- up assist via sit<>stand. Supervision overall for toileting and hygiene. Pt transported back to room with total A where pt was left supine in bed with bed alarm activated and all needs within reach.   Therapy Documentation Precautions:  Precautions Precautions: Fall Restrictions Weight Bearing Restrictions: No  Pain: Pt reports no pain during session.    Therapy/Group: Individual Therapy  Precious Haws 09/22/2020, 3:42 PM

## 2020-09-22 NOTE — Progress Notes (Signed)
PROGRESS NOTE   Subjective/Complaints: Tongue still dry and sore. Right face tingling/painful. Left side also, but in general improved. Excited about getting home this week  ROS: Patient denies fever, rash, sore throat, blurred vision, nausea, vomiting, diarrhea, cough, shortness of breath or chest pain, joint or back pain, headache, or mood change.   Objective:   No results found. Recent Labs    09/21/20 0643  WBC 10.4  HGB 11.4*  HCT 35.8*  PLT 306    Recent Labs    09/21/20 0643  NA 139  K 3.8  CL 105  CO2 26  GLUCOSE 140*  BUN 15  CREATININE 0.74  CALCIUM 8.6*     Intake/Output Summary (Last 24 hours) at 09/22/2020 1324 Last data filed at 09/21/2020 1851 Gross per 24 hour  Intake 240 ml  Output --  Net 240 ml        Physical Exam: Vital Signs Blood pressure 121/72, pulse 75, temperature 98 F (36.7 C), temperature source Oral, resp. rate 14, height '5\' 2"'$  (1.575 m), weight 70.4 kg, SpO2 (!) 80 %. Constitutional: No distress . Vital signs reviewed. HEENT: NCAT, EOMI, oral membranes moist. Sclera sl irritated.  Neck: supple Cardiovascular: RRR without murmur. No JVD    Respiratory/Chest: CTA Bilaterally without wheezes or rales. Normal effort    GI/Abdomen: BS +, non-tender, non-distended Ext: no clubbing, cyanosis, or edema Psych: much calmer today. pleasant Skin: Warm and dry.  Intact.  .surgical wound healed.  Musc: No edema in extremities.  No tenderness in extremities. Neuro: Alert Motor: Bilateral upper extremities: Grossly intact, stable Right lower extremity: Hip flexion 3/5, knee extension 4/5, stable Left lower extremity: Hip flexion 3-/5, knee 4/5  Assessment/Plan: 1. Functional deficits which require 3+ hours per day of interdisciplinary therapy in a comprehensive inpatient rehab setting. Physiatrist is providing close team supervision and 24 hour management of active medical problems  listed below. Physiatrist and rehab team continue to assess barriers to discharge/monitor patient progress toward functional and medical goals  Care Tool:  Bathing    Body parts bathed by patient: Right arm, Left arm, Chest, Abdomen, Front perineal area, Buttocks, Right upper leg, Left upper leg, Right lower leg, Left lower leg, Face         Bathing assist Assist Level: Supervision/Verbal cueing     Upper Body Dressing/Undressing Upper body dressing   What is the patient wearing?: Pull over shirt    Upper body assist Assist Level: Set up assist    Lower Body Dressing/Undressing Lower body dressing      What is the patient wearing?: Pants     Lower body assist Assist for lower body dressing: Contact Guard/Touching assist     Toileting Toileting    Toileting assist Assist for toileting: Contact Guard/Touching assist     Transfers Chair/bed transfer  Transfers assist     Chair/bed transfer assist level: Minimal Assistance - Patient > 75%     Locomotion Ambulation   Ambulation assist      Assist level: Moderate Assistance - Patient 50 - 74% Assistive device: No Device Max distance: 180   Walk 10 feet activity   Assist  Assist level: Moderate Assistance - Patient - 50 - 74% Assistive device: No Device   Walk 50 feet activity   Assist    Assist level: Moderate Assistance - Patient - 50 - 74% Assistive device: No Device    Walk 150 feet activity   Assist Walk 150 feet activity did not occur: Safety/medical concerns  Assist level: Moderate Assistance - Patient - 50 - 74% Assistive device: No Device    Walk 10 feet on uneven surface  activity   Assist     Assist level: Contact Guard/Touching assist Assistive device: Other (comment) (no device)   Wheelchair     Assist Will patient use wheelchair at discharge?: No Type of Wheelchair: Manual    Wheelchair assist level: Moderate Assistance - Patient 50 - 74% Max wheelchair  distance: 50    Wheelchair 50 feet with 2 turns activity    Assist        Assist Level: Moderate Assistance - Patient 50 - 74%   Wheelchair 150 feet activity     Assist          Blood pressure 121/72, pulse 75, temperature 98 F (36.7 C), temperature source Oral, resp. rate 14, height '5\' 2"'$  (1.575 m), weight 70.4 kg, SpO2 (!) 80 %.  Medical Problem List and Plan: 1.  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  Antithrombotics: -DVT/anticoagulation:  Pharmaceutical: Lovenox             -antiplatelet therapy: N/A 3. Headaches/Pain Management:  Continue hydrocodone prn--  -continue gabapentin for neuropathic pain to 400 mg tid   -behavioral mod.  Controlled with meds on 8/16 4. Mood: Team to provide ego support. LCSW to follow for evaluation and support.              -antipsychotic agents:  seroquel 5. Neuropsych: This patient is capable of making decisions on her own behalf. 6. Skin/Wound Care: Monitor incision for healing.   -facial wounds healing nicely  7. Fluids/Electrolytes/Nutrition:   -encourage fluids 8. HTN: Monitor BP tid--continue Norvasc and Maxizide Relatively controlled on 8/16 9. T2DM: Hgb A1c-6.2 on 7/26             --monitor BS ac/hs and use SSI for elevated BS.  controlled on 8/16  10. H/o bipolar d/o and reported schizophrenia: suicidal ideations this visit  -improving -increased Prozac to '80mg'$  8/6 -Continue Xanax bid prn w/ atarax prn.  -pt needs ego support -seroquel '50mg'$  bid added to '100mg'$  qhs   -gabapentin '300mg'$  tid---increased to '400mg'$  tid 8/6 -appreciate psychiatry input, counseling -neuropsychology input would also be helpful -steroids weaned off -chaplain consulted again given her family loss -8/16 mood improved 11. Constipation: On Linzess. scheduled miralax    -dulcolax supp prn, enema prn 12. OSA: Has been  compliant with CPAP. Will need to order her a new mask for home.  13. Chronic cough: For past 3-4 months. Continue scheduled Mucinex DM.             --pulmonary hygiene with flutter valve. 14. Cerebral edema: Completed course of Decadron 15. Right thigh herpetic lesion: Continue Valcyclovir '500mg'$  daily. 16. Bradycardia: decrease Coreg to daily  Stable on 8/13 17.  Steroid-induced leukocytosis  WBCs 20.8 on 8/8--10.4 8/15  No signs or symptoms of infection, afebrile  18. Oral discomfort:   -likely from very dry mucosa  -added biotene and chloraseptic spray  -right eye  dryness--saline gtts       LOS:g  15 days A FACE TO FACE EVALUATION WAS PERFORMED  Meredith Staggers 09/22/2020, 1:24 PM

## 2020-09-23 LAB — GLUCOSE, CAPILLARY
Glucose-Capillary: 122 mg/dL — ABNORMAL HIGH (ref 70–99)
Glucose-Capillary: 112 mg/dL — ABNORMAL HIGH (ref 70–99)
Glucose-Capillary: 125 mg/dL — ABNORMAL HIGH (ref 70–99)
Glucose-Capillary: 151 mg/dL — ABNORMAL HIGH (ref 70–99)

## 2020-09-23 MED ORDER — NAPHAZOLINE-GLYCERIN 0.012-0.25 % OP SOLN
1.0000 [drp] | Freq: Four times a day (QID) | OPHTHALMIC | Status: DC
  Administered 2020-09-23 (×2): 1 [drp] via OPHTHALMIC
  Administered 2020-09-23 – 2020-09-24 (×2): 2 [drp] via OPHTHALMIC

## 2020-09-23 NOTE — Progress Notes (Signed)
Physical Therapy Discharge Summary  Patient Details  Name: Lindsay Rivera MRN: [id number] Date of Birth: 07-Nov-1960  Today's Date: 09/23/2020 PT Individual Time: 6606-3016 PT Individual Time Calculation (min): 84 min    Patient has met 8 of 8 long term goals due to improved activity tolerance, improved balance, improved postural control, increased strength, and improved coordination.  Patient to discharge at an ambulatory level Supervision.   Patient's care partner is independent to provide the necessary physical assistance at discharge.  Reasons goals not met: NA  Recommendation:  Patient will benefit from ongoing skilled PT services in home health setting to continue to advance safe functional mobility, address ongoing impairments in strength, balance, ambulation, and minimize fall risk.  Equipment: No equipment provided  Reasons for discharge: treatment goals met and discharge from hospital  Patient/family agrees with progress made and goals achieved: Yes  Skilled Therapeutic Interventions: Pt received supine and agrees to therapy. No complaint of pain. Bed mobility independent. Pt performs sit to stand multiple reps during session with verbal cues for hand placement for safety. Pt ambulates x300' with rollator and cues for upright gaze to improve posture and balance, increasing proximity to AD for safety, and ensuring brakes are locked when making transitional movements. Following seated rest break pt performs car transfer and ramp navigation with supervision and cues for positioning. Pt ambulates 150' to stairs and completes 12 steps with bilateral hand rails and cues for step sequencing. Pt ambulates 200' back to room with rollator. WC transport outside for time management. Pt practices gait over unlevel and barying surfaces for community reintegration and for increased balance challenge. Pt ambulates bouts of x300' and x500' with cues to increase L step height due to occasional foot  drag. Pt then ambulates from outside back to room, navigating elevators and open environment with supervision. Sit to supine independently. Pt left supine with alarm intact and all needs within reach.  PT Discharge Precautions/Restrictions Precautions Precautions: Fall Restrictions Weight Bearing Restrictions: No Vision/Perception  Perception Perception: Within Functional Limits Praxis Praxis: Intact  Cognition Overall Cognitive Status: Within Functional Limits for tasks assessed Arousal/Alertness: Awake/alert Orientation Level: Oriented X4 Behaviors: Impulsive Safety/Judgment: Impaired Comments: Impulsive Sensation Sensation Light Touch: Impaired Detail Peripheral sensation comments: Paresthesia in L lower extremity. Improved from eval. Light Touch Impaired Details: Impaired LUE;Impaired LLE Coordination Gross Motor Movements are Fluid and Coordinated: No Fine Motor Movements are Fluid and Coordinated: No Coordination and Movement Description: decreased smoothness and accuracy on L side, improved from eval Motor  Motor Motor: Hemiplegia Motor - Skilled Clinical Observations: Mild L Hemiplegia, Imprvoed from eval  Mobility Bed Mobility Bed Mobility: Supine to Sit;Sit to Supine Supine to Sit: Independent Sit to Supine: Independent Transfers Transfers: Sit to Stand;Stand to Sit;Stand Pivot Transfers Sit to Stand: Supervision/Verbal cueing Stand to Sit: Supervision/Verbal cueing Stand Pivot Transfers: Supervision/Verbal cueing Stand Pivot Transfer Details: Verbal cues for precautions/safety Transfer (Assistive device): Rollator Locomotion  Gait Ambulation: Yes Gait Assistance: Supervision/Verbal cueing Gait Distance (Feet): 500 Feet Assistive device: Rollator Gait Assistance Details: Verbal cues for sequencing;Verbal cues for gait pattern;Verbal cues for technique Gait Gait: Yes Gait Pattern: Impaired Gait Pattern: Decreased stride length (occasional foot drage on  L) Gait velocity: Decreased Stairs / Additional Locomotion Stairs: Yes Stairs Assistance: Supervision/Verbal cueing Stair Management Technique: Two rails Number of Stairs: 12 Height of Stairs: 6 Ramp: Supervision/Verbal cueing Curb: Supervision/Verbal cueing Wheelchair Mobility Wheelchair Mobility: No  Trunk/Postural Assessment  Cervical Assessment Cervical Assessment:  (forward head) Thoracic Assessment Thoracic Assessment:  (  rounded shoulders) Lumbar Assessment Lumbar Assessment:  (posterior pelvic tilt) Postural Control Postural Control: Within Functional Limits  Balance Balance Balance Assessed: Yes Static Sitting Balance Static Sitting - Balance Support: Feet supported Static Sitting - Level of Assistance: 7: Independent Dynamic Sitting Balance Dynamic Sitting - Balance Support: Feet supported Dynamic Sitting - Level of Assistance: 7: Independent Static Standing Balance Static Standing - Balance Support: During functional activity Static Standing - Level of Assistance: 5: Stand by assistance Dynamic Standing Balance Dynamic Standing - Balance Support: During functional activity Dynamic Standing - Level of Assistance: 5: Stand by assistance Extremity Assessment  RLE Assessment RLE Assessment: Within Functional Limits LLE Assessment General Strength Comments: Grossly 4/5    Breck Coons, PT, DPT 09/23/2020, 4:31 PM

## 2020-09-23 NOTE — Progress Notes (Signed)
Occupational Therapy Session Note  Patient Details  Name: Lindsay Rivera MRN: [id number] Date of Birth: 05/23/1960  Today's Date: 09/23/2020 OT Individual Time: 1130-1145 OT Individual Time Calculation (min): 15 min    Short Term Goals:  Week 3:  OT Short Term Goal 1 (Week 3): LTG=STG 2/2 ELOS  Skilled Therapeutic Interventions/Progress Updates:    Pt received supine with HOB elevated, no c/o pain. Pt initially engaging with OT as we discussed d/c planning and repeated testing of vision, BUE, and balance. Pt then began falling asleep mid conversation and reported recent medication making her tired. Pt was left supine with all needs met, bed alarm set. 15 min missed d/t fatigue.   Therapy Documentation Precautions:  Precautions Precautions: Fall Restrictions Weight Bearing Restrictions: No  Therapy/Group: Individual Therapy   H  09/23/2020, 6:04 AM 

## 2020-09-23 NOTE — Progress Notes (Signed)
PROGRESS NOTE   Subjective/Complaints: Tongue feeling better. Didn't receive eye drops! Facial sensations are irritating, left foot, too. Anxious to be goinghome!  ROS: Patient denies fever, rash, sore throat, blurred vision, nausea, vomiting, diarrhea, cough, shortness of breath or chest pain, joint or back pain .    Objective:   No results found. Recent Labs    09/21/20 0643  WBC 10.4  HGB 11.4*  HCT 35.8*  PLT 306    Recent Labs    09/21/20 0643  NA 139  K 3.8  CL 105  CO2 26  GLUCOSE 140*  BUN 15  CREATININE 0.74  CALCIUM 8.6*     Intake/Output Summary (Last 24 hours) at 09/23/2020 0838 Last data filed at 09/22/2020 1300 Gross per 24 hour  Intake 118 ml  Output --  Net 118 ml        Physical Exam: Vital Signs Blood pressure 129/72, pulse 79, temperature 98.1 F (36.7 C), temperature source Oral, resp. rate 14, height '5\' 2"'$  (1.575 m), weight 70.4 kg, SpO2 100 %. Constitutional: No distress . Vital signs reviewed. HEENT: NCAT, EOMI, oral membranes moist Neck: supple Cardiovascular: RRR without murmur. No JVD    Respiratory/Chest: CTA Bilaterally without wheezes or rales. Normal effort    GI/Abdomen: BS +, non-tender, non-distended Ext: no clubbing, cyanosis, or edema Psych: pleasant and cooperative  Skin: Warm and dry.  Intact.  surgical wound healed.  Musc: No edema in extremities.  No tenderness in extremities. Neuro: Alert Motor: Bilateral upper extremities: Grossly intact, stable Right lower extremity: Hip flexion 3/5, knee extension 4/5, stable Left lower extremity: Hip flexion 3-/5, knee 4/5  Assessment/Plan: 1. Functional deficits which require 3+ hours per day of interdisciplinary therapy in a comprehensive inpatient rehab setting. Physiatrist is providing close team supervision and 24 hour management of active medical problems listed below. Physiatrist and rehab team continue to assess  barriers to discharge/monitor patient progress toward functional and medical goals  Care Tool:  Bathing    Body parts bathed by patient: Right arm, Left arm, Chest, Abdomen, Front perineal area, Buttocks, Right upper leg, Left upper leg, Right lower leg, Left lower leg, Face         Bathing assist Assist Level: Supervision/Verbal cueing     Upper Body Dressing/Undressing Upper body dressing   What is the patient wearing?: Pull over shirt    Upper body assist Assist Level: Set up assist    Lower Body Dressing/Undressing Lower body dressing      What is the patient wearing?: Pants     Lower body assist Assist for lower body dressing: Set up assist     Toileting Toileting    Toileting assist Assist for toileting: Supervision/Verbal cueing     Transfers Chair/bed transfer  Transfers assist     Chair/bed transfer assist level: Contact Guard/Touching assist     Locomotion Ambulation   Ambulation assist      Assist level: Moderate Assistance - Patient 50 - 74% Assistive device: No Device Max distance: 180   Walk 10 feet activity   Assist     Assist level: Moderate Assistance - Patient - 50 - 74% Assistive device: No  Device   Walk 50 feet activity   Assist    Assist level: Moderate Assistance - Patient - 50 - 74% Assistive device: No Device    Walk 150 feet activity   Assist Walk 150 feet activity did not occur: Safety/medical concerns  Assist level: Moderate Assistance - Patient - 50 - 74% Assistive device: No Device    Walk 10 feet on uneven surface  activity   Assist     Assist level: Contact Guard/Touching assist Assistive device: Other (comment)   Wheelchair     Assist Will patient use wheelchair at discharge?: No Type of Wheelchair: Manual    Wheelchair assist level: Moderate Assistance - Patient 50 - 74% Max wheelchair distance: 50    Wheelchair 50 feet with 2 turns activity    Assist        Assist  Level: Moderate Assistance - Patient 50 - 74%   Wheelchair 150 feet activity     Assist          Blood pressure 129/72, pulse 79, temperature 98.1 F (36.7 C), temperature source Oral, resp. rate 14, height '5\' 2"'$  (1.575 m), weight 70.4 kg, SpO2 100 %.  Medical Problem List and Plan: 1.  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  Antithrombotics: -DVT/anticoagulation:  Pharmaceutical: Lovenox             -antiplatelet therapy: N/A 3. Headaches/Pain Management:  Continue hydrocodone prn--  -continue gabapentin for neuropathic pain to 400 mg tid   -behavioral mod.  Controlled with meds on 8/17 4. Mood: Team to provide ego support. LCSW to follow for evaluation and support.              -antipsychotic agents:  seroquel 5. Neuropsych: This patient is capable of making decisions on her own behalf. 6. Skin/Wound Care: Monitor incision for healing.   -facial wounds healing nicely  7. Fluids/Electrolytes/Nutrition:   -encourage fluids 8. HTN: Monitor BP tid--continue Norvasc and Maxizide Relatively controlled on 8/16 9. T2DM: Hgb A1c-6.2 on 7/26             --monitor BS ac/hs and use SSI for elevated BS.  controlled on 8/16  10. H/o bipolar d/o and reported schizophrenia: suicidal ideations this visit  -improving -increased Prozac to '80mg'$  8/6 -Continue Xanax bid prn w/ atarax prn.  -pt needs ego support -seroquel '50mg'$  bid added to '100mg'$  qhs   -gabapentin '300mg'$  tid---increased to '400mg'$  tid 8/6 -appreciate psychiatry input, counseling -neuropsychology input would also be helpful -steroids weaned off -chaplain consulted again given her family loss -8/17 mood improved--continue same meds at discharge 11. Constipation: On Linzess. scheduled miralax    -dulcolax supp prn, enema prn 12. OSA: Has been compliant with CPAP. Will need to order her a new mask for home.   13. Chronic cough: For past 3-4 months. Continue scheduled Mucinex DM.             --pulmonary hygiene with flutter valve. 14. Cerebral edema: Completed course of Decadron 15. Right thigh herpetic lesion: Continue Valcyclovir '500mg'$  daily. 16. Bradycardia: decrease Coreg to daily  Stable on 8/13 17.  Steroid-induced leukocytosis  WBCs 20.8 on 8/8--10.4 8/15  No signs or symptoms of infection, afebrile  18. Oral discomfort:   -likely from very dry mucosa  -added biotene and chloraseptic spray with improvement 19. 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

## 2020-09-23 NOTE — Progress Notes (Signed)
Occupational Therapy Discharge Summary  Patient Details  Name: Lindsay Rivera MRN: [id number] Date of Birth: [birth year]  Today's Date: 09/23/2020 OT Individual Time: 1330-1415 OT Individual Time Calculation (min): 45 min    Patient has met 10 of 10 long term goals due to improved activity tolerance, improved balance, postural control, ability to compensate for deficits, functional use of  LEFT upper and LEFT lower extremity, improved attention, improved awareness, and improved coordination.  Patient to discharge at overall Supervision level.  Patient's care partner is independent to provide the necessary physical assistance at discharge.    Reasons goals not met: All goals met.   Recommendation:  Patient will benefit from ongoing skilled OT services in home health setting to continue to advance functional skills in the area of BADL.  Equipment: TTB, BSC  Reasons for discharge: treatment goals met and discharge from hospital  Patient/family agrees with progress made and goals achieved: Yes  Skilled OT intervention: Pt supine sleeping, but easily awoken and more alert this session compared to last before lunch. Pt requesting to take shower. Pt completed sit > stand and ambulatory transfer into the bathroom with the rollator with (S). Good carryover of rollator safety, asking if brakes were locked before standing. Pt transferred to toilet and completed all toileting tasks with (S). Bathing at shower level with set up assist only. Dressing EOB with (S). Pt requesting to end therapy but OT was able to convince pt to complete functional mobility 500+ ft with (S) using rollator. Pt was able to use the rollator around the gift shop with supervision ,edu provided re community accessibility and safety. Pt declined any further participation and returned to bed. Bed alarm set, all needs met.   OT Discharge Precautions/Restrictions  Precautions Precautions: Fall Restrictions Weight Bearing  Restrictions: No   Pain Pain Assessment Pain Scale: 0-10 Pain Score: 0-No pain ADL ADL Eating: Set up Grooming: Modified independent Upper Body Bathing: Supervision/safety Where Assessed-Upper Body Bathing: Shower Lower Body Bathing: Supervision/safety Where Assessed-Lower Body Bathing: Shower Upper Body Dressing: Supervision/safety Where Assessed-Upper Body Dressing: Sitting at sink Lower Body Dressing: Supervision/safety Where Assessed-Lower Body Dressing: Sitting at sink Toileting: Supervision/safety Where Assessed-Toileting: Glass blower/designer: Close supervision Armed forces technical officer Method: Counselling psychologist: Energy manager: Close supervision Social research officer, government Method: Heritage manager: Grab bars, Radio broadcast assistant Vision Baseline Vision/History: Wears glasses Wears Glasses: At all times Patient Visual Report: Blurring of vision Vision Assessment?: Yes Eye Alignment: Within Functional Limits Ocular Range of Motion: Within Functional Limits Alignment/Gaze Preference: Within Defined Limits Tracking/Visual Pursuits: Decreased smoothness of horizontal tracking;Decreased smoothness of vertical tracking Convergence: Impaired (comment) (slightly impaired) Diplopia Assessment: Other (comment) (reports she still has occasional diplopia) Additional Comments: R>L blurriness Perception  Perception: Within Functional Limits Praxis Praxis: Intact Cognition Overall Cognitive Status: Within Functional Limits for tasks assessed Arousal/Alertness: Awake/alert Orientation Level: Oriented X4 Attention: Selective Selective Attention: Appears intact Memory: Appears intact Awareness: Impaired Awareness Impairment: Emergent impairment Problem Solving: Appears intact Behaviors: Impulsive Safety/Judgment: Impaired Comments: Impulsive, awareness of deficits impaired Sensation Sensation Light Touch: Impaired  Detail Peripheral sensation comments: Paresthesia in L lower extremity. Improved from eval. Light Touch Impaired Details: Impaired LUE;Impaired LLE Coordination Gross Motor Movements are Fluid and Coordinated: No Fine Motor Movements are Fluid and Coordinated: No Coordination and Movement Description: decreased smoothness and accuracy on L side, improved from eval Motor  Motor Motor: Hemiplegia Motor - Skilled Clinical Observations: Mild L Hemiplegia, Imprvoed from eval Motor - Discharge  Observations: mild L hemi- much improved Mobility  Bed Mobility Bed Mobility: Supine to Sit;Sit to Supine Supine to Sit: Independent Sit to Supine: Independent Transfers Sit to Stand: Supervision/Verbal cueing Stand to Sit: Supervision/Verbal cueing  Trunk/Postural Assessment  Cervical Assessment Cervical Assessment: Exceptions to [facility] (forward head) Thoracic Assessment Thoracic Assessment: Exceptions to [facility] (rounded shoulders) Lumbar Assessment Lumbar Assessment: Exceptions to [facility] (posterior pelvic tilt) Postural Control Postural Control: Deficits on evaluation (righting reactions limited)  Balance Balance Balance Assessed: Yes Static Sitting Balance Static Sitting - Balance Support: Feet supported Static Sitting - Level of Assistance: 7: Independent Dynamic Sitting Balance Dynamic Sitting - Balance Support: Feet supported Dynamic Sitting - Level of Assistance: 7: Independent Static Standing Balance Static Standing - Balance Support: During functional activity Static Standing - Level of Assistance: 5: Stand by assistance Dynamic Standing Balance Dynamic Standing - Balance Support: During functional activity Dynamic Standing - Level of Assistance: 5: Stand by assistance Extremity/Trunk Assessment RUE Assessment RUE Assessment: Within Functional Limits LUE Assessment LUE Assessment: Exceptions to [facility] General Strength Comments: Coordination deficitsm 4-/5 MMT LUE Body System:  Neuro Brunstrum levels for arm and hand: Arm;Hand Brunstrum level for arm: Stage V Relative Independence from [employer] Brunstrum level for hand: Stage VI Isolated joint movements   Curtis Sites 09/23/2020, 11:44 AM

## 2020-09-23 NOTE — Progress Notes (Signed)
Patient ID: PALESTINE MOSCO, female   DOB: 20-Jun-1960, 60 y.o.   MRN: [id number]  SW met with pt to discuss HHA preference. Prefers [facility] ([phone]/f:[phone]). PCS home care agency preference: [facility], and [facility].   SW faxed PCS referral to [facility] [phone]). SW faxed HHA referral to [facility] [phone] and waiting on follow-up; SW also left message discussing referral.   SW called pt son [name] to discuss above and to get address: [address], [place], [place] [address]. He is aware SW will d/c tomorrow and SW working on [facility]. Family can provide intermittent support only due to work schedule.   Loralee Pacas, MSW, [place] Office: [phone] Cell: [phone] Fax: [phone]

## 2020-09-24 LAB — GLUCOSE, CAPILLARY: Glucose-Capillary: 99 mg/dL (ref 70–99)

## 2020-09-24 MED ORDER — ERGOCALCIFEROL 1.25 MG (50000 UT) PO CAPS: 50000.0000 [IU] | ORAL_CAPSULE | ORAL | 0 refills | Status: AC

## 2020-09-24 MED ORDER — METFORMIN HCL ER 500 MG PO TB24: 500.0000 mg | ORAL_TABLET | Freq: Every day | ORAL | 1 refills | Status: DC

## 2020-09-24 MED ORDER — ALPRAZOLAM 0.5 MG PO TABS: 0.5000 mg | ORAL_TABLET | Freq: Two times a day (BID) | ORAL | 0 refills | Status: AC | PRN

## 2020-09-24 MED ORDER — LEVOTHYROXINE SODIUM 112 MCG PO TABS: ORAL_TABLET | Freq: Every day | ORAL | 0 refills | Status: DC

## 2020-09-24 MED ORDER — FLUOXETINE HCL 40 MG PO CAPS: 80.0000 mg | ORAL_CAPSULE | Freq: Every morning | ORAL | 3 refills | Status: DC

## 2020-09-24 MED ORDER — ESOMEPRAZOLE MAGNESIUM 40 MG PO CPDR: 40.0000 mg | DELAYED_RELEASE_CAPSULE | Freq: Every day | ORAL | 3 refills | Status: DC

## 2020-09-24 MED ORDER — LINACLOTIDE 290 MCG PO CAPS: 290.0000 ug | ORAL_CAPSULE | Freq: Every day | ORAL | 0 refills | Status: AC

## 2020-09-24 MED ORDER — AMLODIPINE BESYLATE 10 MG PO TABS
10.0000 mg | ORAL_TABLET | Freq: Every day | ORAL | 3 refills | Status: DC
Start: 2020-09-24 — End: 2021-08-17

## 2020-09-24 MED ORDER — TRIAMTERENE-HCTZ 37.5-25 MG PO TABS: 1.0000 | ORAL_TABLET | Freq: Every day | ORAL | 3 refills | Status: DC

## 2020-09-24 MED ORDER — CALCIUM CARBONATE-VITAMIN D 600-400 MG-UNIT PO CHEW: 1.0000 | CHEWABLE_TABLET | Freq: Every day | ORAL | 0 refills | Status: DC

## 2020-09-24 MED ORDER — POLYETHYLENE GLYCOL 3350 17 G PO PACK: 17.0000 g | PACK | Freq: Every day | ORAL | 0 refills | Status: DC

## 2020-09-24 MED ORDER — VALACYCLOVIR HCL 500 MG PO TABS: 500.0000 mg | ORAL_TABLET | Freq: Every day | ORAL | 0 refills | Status: AC

## 2020-09-24 MED ORDER — ACETAMINOPHEN 325 MG PO TABS: 325.0000 mg | ORAL_TABLET | ORAL | Status: DC | PRN

## 2020-09-24 MED ORDER — GABAPENTIN 300 MG PO CAPS: 600.0000 mg | ORAL_CAPSULE | Freq: Three times a day (TID) | ORAL | 0 refills | Status: DC

## 2020-09-24 MED ORDER — HYDROXYZINE HCL 25 MG PO TABS: 25.0000 mg | ORAL_TABLET | Freq: Three times a day (TID) | ORAL | 0 refills | Status: DC | PRN

## 2020-09-24 MED ORDER — ROSUVASTATIN CALCIUM 20 MG PO TABS: 20.0000 mg | ORAL_TABLET | Freq: Every day | ORAL | 3 refills | Status: DC

## 2020-09-24 MED ORDER — QUETIAPINE FUMARATE 100 MG PO TABS: 100.0000 mg | ORAL_TABLET | Freq: Every day | ORAL | 0 refills | Status: DC

## 2020-09-24 MED ORDER — HYDROCODONE-ACETAMINOPHEN 10-325 MG PO TABS: 1.0000 | ORAL_TABLET | ORAL | 0 refills | Status: DC | PRN

## 2020-09-24 MED ORDER — ALBUTEROL SULFATE HFA 108 (90 BASE) MCG/ACT IN AERS: 1.0000 | INHALATION_SPRAY | Freq: Four times a day (QID) | RESPIRATORY_TRACT | 0 refills | Status: DC | PRN

## 2020-09-24 MED ORDER — CARVEDILOL 3.125 MG PO TABS: 3.1250 mg | ORAL_TABLET | Freq: Every day | ORAL | 0 refills | Status: DC

## 2020-09-24 NOTE — Progress Notes (Signed)
PROGRESS NOTE   Subjective/Complaints: Mouth better. Right ear, face less sensitive. No new complaints.   ROS: Patient denies fever, rash, sore throat, blurred vision, nausea, vomiting, diarrhea, cough, shortness of breath or chest pain    Objective:   No results found. No results for input(s): WBC, HGB, HCT, PLT in the last 72 hours.   No results for input(s): NA, K, CL, CO2, GLUCOSE, BUN, CREATININE, CALCIUM in the last 72 hours.   No intake or output data in the 24 hours ending 09/24/20 0921       Physical Exam: Vital Signs Blood pressure 104/75, pulse 64, temperature 98.1 F (36.7 C), temperature source Oral, resp. rate 16, height '5\' 2"'$  (1.575 m), weight 70.4 kg, SpO2 100 %. Constitutional: No distress . Vital signs reviewed. HEENT: NCAT, EOMI, oral membranes moist Neck: supple Cardiovascular: RRR without murmur. No JVD    Respiratory/Chest: CTA Bilaterally without wheezes or rales. Normal effort    GI/Abdomen: BS +, non-tender, non-distended Ext: no clubbing, cyanosis, or edema Psych: pleasant and cooperative  Skin: Warm and dry.  Intact.  surgical wound well healed.  Musc: No edema in extremities.  No tenderness in extremities. Neuro: Alert Motor: Bilateral upper extremities: Grossly intact, stable Right lower extremity: Hip flexion 3/5, knee extension 4/5, stable Left lower extremity: Hip flexion 3-/5, knee 4/5. Motor/sensory exam stable  Assessment/Plan: 1. Functional deficits which require 3+ hours per day of interdisciplinary therapy in a comprehensive inpatient rehab setting. Physiatrist is providing close team supervision and 24 hour management of active medical problems listed below. Physiatrist and rehab team continue to assess barriers to discharge/monitor patient progress toward functional and medical goals  Care Tool:  Bathing    Body parts bathed by patient: Right arm, Left arm, Chest,  Abdomen, Front perineal area, Buttocks, Right upper leg, Left upper leg, Right lower leg, Left lower leg, Face         Bathing assist Assist Level: Set up assist     Upper Body Dressing/Undressing Upper body dressing   What is the patient wearing?: Pull over shirt    Upper body assist Assist Level: Independent    Lower Body Dressing/Undressing Lower body dressing      What is the patient wearing?: Pants     Lower body assist Assist for lower body dressing: Supervision/Verbal cueing     Toileting Toileting    Toileting assist Assist for toileting: Supervision/Verbal cueing     Transfers Chair/bed transfer  Transfers assist     Chair/bed transfer assist level: Supervision/Verbal cueing     Locomotion Ambulation   Ambulation assist      Assist level: Supervision/Verbal cueing Assistive device: Rollator Max distance: 500'   Walk 10 feet activity   Assist     Assist level: Supervision/Verbal cueing Assistive device: Rollator   Walk 50 feet activity   Assist    Assist level: Supervision/Verbal cueing Assistive device: Rollator    Walk 150 feet activity   Assist Walk 150 feet activity did not occur: Safety/medical concerns  Assist level: Supervision/Verbal cueing Assistive device: Rollator    Walk 10 feet on uneven surface  activity   Assist  Assist level: Supervision/Verbal cueing Assistive device: Rollator   Wheelchair     Assist Will patient use wheelchair at discharge?: No Type of Wheelchair: Manual    Wheelchair assist level: Moderate Assistance - Patient 50 - 74% Max wheelchair distance: 50    Wheelchair 50 feet with 2 turns activity    Assist        Assist Level: Moderate Assistance - Patient 50 - 74%   Wheelchair 150 feet activity     Assist          Blood pressure 104/75, pulse 64, temperature 98.1 F (36.7 C), temperature source Oral, resp. rate 16, height '5\' 2"'$  (1.575 m), weight 70.4 kg,  SpO2 100 %.  Medical Problem List and Plan: 1.  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  Antithrombotics: -DVT/anticoagulation:  Pharmaceutical: Lovenox             -antiplatelet therapy: N/A 3. Headaches/Pain Management:  Continue hydrocodone prn--  -continue gabapentin for neuropathic pain to 400 mg tid   Controlled with meds on 8/18. May have limited supply of hydrocodone 4. HTN: Monitor BP tid--continue Norvasc and Maxizide   controlled on 8/17 9. T2DM: Hgb A1c-6.2 on 7/26             --monitor BS ac/hs and use SSI for elevated BS.  controlled on 8/17  10. H/o bipolar d/o and reported schizophrenia: suicidal ideations this visit  -much improved! -increased Prozac to '80mg'$  8/6--continue -dc on atarax prn.  -pt needs ego support of family -seroquel '50mg'$  bid added to '100mg'$  qhs   -gabapentin '400mg'$  tid 8/6 -suspect steroid effect 11. Constipation: On Linzess. scheduled miralax    -dulcolax supp prn, enema prn 12. OSA: Has been compliant with CPAP. Continue at home 15. Right thigh herpetic lesion:  Valcyclovir '500mg'$  daily. 16. Bradycardia: decreased Coreg to daily  Stable on 8/13 17.  Steroid-induced leukocytosis  resolved 18. Oral discomfort:   -likely from very dry mucosa  -added biotene and chloraseptic spray with improvement 19. 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

## 2020-09-24 NOTE — Progress Notes (Signed)
Inpatient Rehabilitation Care Coordinator Discharge Note  The overall goal for the admission was met for:   Discharge location: Yes. D/c to home with her son and his girlfriend who will provide intermittent support.   Length of Stay: Yes. 16 days.   Discharge activity level: Yes. Supervision.  Home/community participation: Yes. Limited.   Services provided included: MD, RD, PT, OT, RN, CM, TR, Pharmacy, Neuropsych, and SW  Financial Services: Medicaid and Private Insurance: [facility] Medicare  Choices offered to/list presented to:Yes  Follow-up services arranged: Home Health: [facility] for HHPT/OT/aide, DME: [place] for [facility] and TTB(DME to be shipped to son's home), and Patient/Family request agency HH: [facility], DME: N/A  Comments (or additional information): Pt approved for PCS by [employer].   Patient/Family verbalized understanding of follow-up arrangements: Yes  Individual responsible for coordination of the follow-up plan: contact pt [phone] or pt son [name] [phone]  Confirmed correct DME delivered: [name] 09/24/2020    [name]

## 2020-09-24 NOTE — Progress Notes (Signed)
Patient states she is going home tomorrow and has already packed her CPAP machine up. RT asked if she wanted machine hooked back up and she declined. Stated she would be fine without it tonight. Told pt to call if she changed her mind.

## 2020-09-24 NOTE — Progress Notes (Signed)
Patient ID: Lindsay Rivera, female   DOB: Mar 04, 1960, 59 y.o.   MRN: [id number]  SW spoke with Lindsay Rivera/[facility] (p:[phone]/f:[phone]) to discuss referral. Reports she will review, and will f/u. She was made aware pt will d/c today.   SW faxed PCS referral to [facility] [phone]) again due to one box not being checked. SW waiting on follow-up.  SW met with pt in room to inform waiting on updates on [facility] and PCS and will call her with details after she discharges. Pt did not want to wait for DME, pt asked for DME to be shipped to the home.   *SW informed [facility] to ship DME to the home.   SW spoke with Lindsay Rivera/RN with [employer] who approved pt for [employer]   SW spoke with Lindsay Rivera/[facility] to discuss referral. Reports referral was accepted. SW called pt to inform on above. SW called [name] to inform on above as well.   Loralee Pacas, MSW, [place] Office: [phone] Cell: [phone] Fax: [phone]

## 2020-09-25 ENCOUNTER — Telehealth: Payer: Self-pay

## 2020-09-25 NOTE — Telephone Encounter (Signed)
Discharge orders reviewed per protocol: Verbal okay give to Lindsay Rivera with [facility] to provide Physical Therapy once a wee for on one week, then twice a week for 7 weeks.

## 2020-09-26 NOTE — Discharge Summary (Signed)
Physician Discharge Summary  Patient ID: Lindsay Rivera MRN: [id number] DOB/AGE: 1960-08-29 60 y.o.  Admit date: 09/07/2020 Discharge date: 09/24/2020  Discharge Diagnoses:  Principal Problem:   Meningioma [facility]) Active Problems:   S/P resection of meningioma   Bipolar 1 disorder, mixed, moderate (HCC)   Controlled type 2 diabetes mellitus with complication, without long-term current use of insulin (HCC)   Discharged Condition: stable  Significant Diagnostic Studies: N/ a   Labs:  Basic Metabolic Panel: BMP Latest Ref Rng & Units 09/21/2020 09/14/2020 09/08/2020  Glucose 70 - 99 mg/dL 140(H) 108(H) 138(H)  BUN 6 - 20 mg/dL 15 21(H) 25(H)  Creatinine 0.44 - 1.00 mg/dL 0.74 0.81 0.85  BUN/Creat Ratio 9 - 23 - - -  Sodium 135 - 145 mmol/L 139 136 137  Potassium 3.5 - 5.1 mmol/L 3.8 4.2 4.2  Chloride 98 - 111 mmol/L 105 99 94(L)  CO2 22 - 32 mmol/L 26 28 32  Calcium 8.9 - 10.3 mg/dL 8.6(L) 9.3 10.7(H)     CBC: CBC Latest Ref Rng & Units 09/21/2020 09/14/2020 09/08/2020  WBC 4.0 - 10.5 K/uL 10.4 20.8(H) 20.6(H)  Hemoglobin 12.0 - 15.0 g/dL 11.4(L) 12.1 14.3  Hematocrit 36.0 - 46.0 % 35.8(L) 38.0 42.6  Platelets 150 - 400 K/uL 306 313 468(H)     CBG: Recent Labs  Lab 09/23/20 0550 09/23/20 1159 09/23/20 1744 09/23/20 2120 09/24/20 0611  GLUCAP 122* 112* 125* 151* 99    Brief HPI:   Lindsay Rivera is a 60 y.o. 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  Observation was recommended on findings in October 2021 however she continued to have decrease in hearing on the right with follow-up MRI showing increase in size.  She was admitted on 09/01/2020 for resection of mass per Dr. Venetia Constable.  Postop patient had issues with right head and ear pain, severe headaches, right lip and posterior tongue numbness with double vision.  Symptoms felt  to be expected from area of resection and Decadron was added on 07/31 due to cerebral edema seen on follow-up MRI and to help manage symptoms.   Therapies were ongoing and patient continued to be limited by left sensory deficits with weakness as well as cognitive deficits with delay in processing and impulsivity.  CIR was recommended due to functional decline.   Hospital Course: Lindsay Rivera was admitted to rehab 09/07/2020 for inpatient therapies to consist of PT, ST and OT at least three hours five days a week. Past admission physiatrist, therapy team and rehab RN have worked together to provide customized collaborative inpatient rehab.  She continues to report and perseverate on right facial numbness and tingling throughout her stay.  Gabapentin was titrated to 400 mg 3 times daily with as needed use of hydrocodone and limited basis.  Her blood pressures were monitored on TID basis and and were not controlled on Norvasc and Maxide. Her diabetes has been monitored with ac/hs CBG checks and blood sugars were controlled with sliding scale insulin.  Serial CBC showed drop in H&H with resolution of leukocytosis and thrombocytosis.  No signs of bleeding noted.  She reported lack of tears in right eye felt to be due to lacrimal nerve injury and eyedrops were ordered to prevent dryness.  Team has provided ego support and encouragement during her stay.  She did report suicidal ideation on 08/05 and psychiatry  was consulted for input.  Seroquel was titrated to manage anxiety and psychiatry recommended close monitoring with EKG to monitor QTC.  Patient denied self-harm and contracted for safety. Hydroxyzine was increased to tid prn to help manage anxiety.  Prozac was resumed and titrated up to 80 mg at bedtime in addition to Seroquel 100 mg nightly with recommendations to follow-up with behavioral health on outpatient basis.  Mood lability and leukocytosis also felt to be due to steroids and has improved with its wean  to off.  Chaplain was consulted for support due to recent family loss.  Her mood and behaviors have improved and supervision is recommended for safety after discharge.  She will continue to receive follow-up HHPT, HHOT and aide by [facility] health after discharge.    Rehab course: During patient's stay in rehab weekly team conferences were held to monitor patient's progress, set goals and discuss barriers to discharge. At admission, patient required min to mod assist with ADL tasks and min assist with mobility. She  has had improvement in activity tolerance, balance, postural control as well as ability to compensate for deficits. She has had improvement in functional use LUE  and LLE as well as improvement in awareness. She is able to complete ADL tasks with supervision. She requires supervision for transfers and to ambulate with 300-500' with rollator and cues to compensate for left foot drag. Family education has been completed.   Discharge disposition: 01-Home or Self Care  Diet: Carb modified.  Special Instructions: No driving.    Discharge Instructions     Ambulatory referral to Physical Medicine Rehab   Complete by: As directed    Moderate complexity follow-up 1 to 2 weeks right tentorial meningioma      Allergies as of 09/24/2020       Reactions   Penicillins Shortness Of Breath, Swelling   Has patient had a PCN reaction causing immediate rash, facial/tongue/throat swelling, SOB or lightheadedness with hypotension: No Has patient had a PCN reaction causing severe rash involving mucus membranes or skin necrosis: No Has patient had a PCN reaction that required hospitalization No Has patient had a PCN reaction occurring within the last 10 years: No If all of the above answers are "NO", then may proceed with Cephalosporin use.   Iodine Nausea And Vomiting, Cough   Gadolinium Derivatives Nausea And Vomiting, Cough   Radiologist and nurse came, IV benadryl was administered, pt  was observed in nurses stations for 30 min prior to leaving, no others steps taken   Latex Rash        Medication List     STOP taking these medications    baclofen 10 MG tablet Commonly known as: LIORESAL   clindamycin 300 MG capsule Commonly known as: CLEOCIN   clopidogrel 75 MG tablet Commonly known as: PLAVIX   hydrocortisone 25 MG suppository Commonly known as: ANUSOL-HC   True Metrix Blood Glucose Test test strip Generic drug: glucose blood   True Metrix Meter w/Device Kit   TRUEplus Lancets 28G Misc       TAKE these medications    acetaminophen 325 MG tablet Commonly known as: TYLENOL Take 1-2 tablets (325-650 mg total) by mouth every 4 (four) hours as needed for mild pain.   albuterol 108 (90 Base) MCG/ACT inhaler Commonly known as: VENTOLIN HFA Inhale 1-2 puffs into the lungs every 6 (six) hours as needed for wheezing or shortness of breath.   ALPRAZolam 0.5 MG tablet Commonly known as: XANAX Take 1  tablet (0.5 mg total) by mouth 2 (two) times daily as needed for anxiety.   amLODipine 10 MG tablet Commonly known as: NORVASC Take 1 tablet (10 mg total) by mouth daily.   Calcium Carbonate-Vitamin D 600-400 MG-UNIT chew tablet Chew 1 tablet by mouth daily.   carvedilol 3.125 MG tablet Commonly known as: COREG Take 1 tablet (3.125 mg total) by mouth daily. What changed:  how much to take when to take this   cetirizine 10 MG tablet Commonly known as: ZYRTEC Take 10 mg by mouth at bedtime.   docusate sodium 100 MG capsule Commonly known as: COLACE Take 1 capsule (100 mg total) by mouth 2 (two) times daily.   ergocalciferol 1.25 MG (50000 UT) capsule Commonly known as: VITAMIN D2 Take 1 capsule (50,000 Units total) by mouth once a week.   esomeprazole 40 MG capsule Commonly known as: NEXIUM Take 1 capsule (40 mg total) by mouth daily at 12 noon.   FLUoxetine 40 MG capsule Commonly known as: PROZAC Take 2 capsules (80 mg total) by mouth  every morning. What changed:  medication strength how much to take   fluticasone 50 MCG/ACT nasal spray Commonly known as: FLONASE Place 2 sprays into both nostrils daily. As needed for allergy symptoms What changed:  when to take this reasons to take this additional instructions   gabapentin 300 MG capsule Commonly known as: NEURONTIN Take 2 capsules (600 mg total) by mouth 3 (three) times daily.   HYDROcodone-acetaminophen 10-325 MG tablet Commonly known as: NORCO Take 1-2 tablets by mouth every 4 (four) hours as needed for moderate pain. What changed:  how much to take reasons to take this   hydrOXYzine 25 MG tablet Commonly known as: ATARAX/VISTARIL Take 1 tablet (25 mg total) by mouth 3 (three) times daily as needed for anxiety (For anxiety and sleep.). What changed:  medication strength how much to take when to take this reasons to take this   levothyroxine 112 MCG tablet Commonly known as: SYNTHROID TAKE 1 TABLET BY MOUTH ONCE A DAY What changed: how much to take   linaclotide 290 MCG Caps capsule Commonly known as: LINZESS Take 1 capsule (290 mcg total) by mouth daily before breakfast.   metFORMIN 500 MG 24 hr tablet Commonly known as: GLUCOPHAGE-XR Take 1 tablet (500 mg total) by mouth daily with breakfast.   polyethylene glycol 17 g packet Commonly known as: MIRALAX / GLYCOLAX Take 17 g by mouth daily.   QUEtiapine 100 MG tablet Commonly known as: SEROQUEL Take 1 tablet (100 mg total) by mouth at bedtime.   rosuvastatin 20 MG tablet Commonly known as: CRESTOR Take 1 tablet (20 mg total) by mouth daily.   triamterene-hydrochlorothiazide 37.5-25 MG tablet Commonly known as: MAXZIDE-25 Take 1 tablet by mouth daily.   valACYclovir 500 MG tablet Commonly known as: VALTREX Take 1 tablet (500 mg total) by mouth daily. What changed:  how much to take how to take this when to take this additional instructions        Follow-up Information      Meredith Staggers, MD Follow up.   Specialty: Physical Medicine and Rehabilitation Why: Office to call for appointment Contact information: [address] [place] [phone]         Judith Part, MD Follow up.   Specialty: Neurosurgery Why: Call for appointment Contact information: [place] [address] [phone]  Signed: Bary Leriche 10/01/2020, 1:06 AM

## 2020-09-29 ENCOUNTER — Telehealth: Payer: Self-pay

## 2020-09-29 NOTE — Telephone Encounter (Signed)
Lindsay Rivera complains of a constant level 9 nerve pain. Pain is all on the left-side of her body. She is taking all of her medications as prescribed but getting no relief.  Is there anything else she can do?   Patient was seen by the physical therapy today and she state her vitals where good. Only the O2 was low (no value given). But the pain made it had to do the physical therapy.   Patient was advise to call the Neurosurgeon Dr. Zada Finders today. Because Dr. Naaman Plummer was not in the office today.   Call back phone [phone]

## 2020-10-02 ENCOUNTER — Encounter: Payer: 59 | Attending: Physical Medicine and Rehabilitation | Admitting: Physical Medicine and Rehabilitation

## 2020-10-02 ENCOUNTER — Encounter: Payer: Self-pay | Admitting: Physical Medicine and Rehabilitation

## 2020-10-02 ENCOUNTER — Other Ambulatory Visit: Payer: Self-pay

## 2020-10-02 VITALS — BP 127/80 | HR 64 | Temp 98.5°F | Ht 62.0 in | Wt 154.2 lb

## 2020-10-02 DIAGNOSIS — M792 Neuralgia and neuritis, unspecified: Secondary | ICD-10-CM

## 2020-10-02 DIAGNOSIS — Z86018 Personal history of other benign neoplasm: Secondary | ICD-10-CM

## 2020-10-02 DIAGNOSIS — R200 Anesthesia of skin: Secondary | ICD-10-CM

## 2020-10-02 DIAGNOSIS — R202 Paresthesia of skin: Secondary | ICD-10-CM | POA: Diagnosis present

## 2020-10-02 DIAGNOSIS — Z9889 Other specified postprocedural states: Secondary | ICD-10-CM

## 2020-10-02 MED ORDER — DULOXETINE HCL 20 MG PO CPEP: 20.0000 mg | ORAL_CAPSULE | Freq: Every day | ORAL | 3 refills | Status: DC

## 2020-10-02 NOTE — Patient Instructions (Signed)
-  Discussed following foods that may reduce pain: 1) Ginger (especially studied for arthritis)- reduce leukotriene production to decrease inflammation 2) Blueberries- high in phytonutrients that decrease inflammation 3) Salmon- marine omega-3s reduce joint swelling and pain 4) Pumpkin seeds- reduce inflammation 5) dark chocolate- reduces inflammation 6) turmeric- reduces inflammation 7) tart cherries - reduce pain and stiffness 8) extra virgin olive oil - its compound olecanthal helps to block prostaglandins  9) chili peppers- can be eaten or applied topically via capsaicin 10) mint- helpful for headache, muscle aches, joint pain, and itching 11) garlic- reduces inflammation  Link to further information on diet for chronic pain: [email or web address]
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
## 2020-10-14 ENCOUNTER — Emergency Department (HOSPITAL_COMMUNITY)
Admission: EM | Admit: 2020-10-14 | Discharge: 2020-10-15 | Disposition: A | Discharge status: Home / Self Care | Payer: 59 | Attending: Emergency Medicine | Admitting: Emergency Medicine

## 2020-10-14 ENCOUNTER — Encounter (HOSPITAL_COMMUNITY): Payer: Self-pay | Admitting: Emergency Medicine

## 2020-10-14 ENCOUNTER — Other Ambulatory Visit: Payer: Self-pay

## 2020-10-14 DIAGNOSIS — I110 Hypertensive heart disease with heart failure: Secondary | ICD-10-CM | POA: Diagnosis not present

## 2020-10-14 DIAGNOSIS — J45909 Unspecified asthma, uncomplicated: Secondary | ICD-10-CM | POA: Insufficient documentation

## 2020-10-14 DIAGNOSIS — E119 Type 2 diabetes mellitus without complications: Secondary | ICD-10-CM | POA: Diagnosis not present

## 2020-10-14 DIAGNOSIS — Z91040 Latex allergy status: Secondary | ICD-10-CM | POA: Insufficient documentation

## 2020-10-14 DIAGNOSIS — R531 Weakness: Secondary | ICD-10-CM | POA: Diagnosis present

## 2020-10-14 DIAGNOSIS — I2510 Atherosclerotic heart disease of native coronary artery without angina pectoris: Secondary | ICD-10-CM | POA: Diagnosis not present

## 2020-10-14 DIAGNOSIS — R110 Nausea: Secondary | ICD-10-CM | POA: Diagnosis not present

## 2020-10-14 DIAGNOSIS — Z87891 Personal history of nicotine dependence: Secondary | ICD-10-CM | POA: Diagnosis not present

## 2020-10-14 DIAGNOSIS — Z7984 Long term (current) use of oral hypoglycemic drugs: Secondary | ICD-10-CM | POA: Insufficient documentation

## 2020-10-14 DIAGNOSIS — Z79899 Other long term (current) drug therapy: Secondary | ICD-10-CM | POA: Diagnosis not present

## 2020-10-14 DIAGNOSIS — I509 Heart failure, unspecified: Secondary | ICD-10-CM | POA: Insufficient documentation

## 2020-10-14 DIAGNOSIS — R200 Anesthesia of skin: Secondary | ICD-10-CM | POA: Diagnosis not present

## 2020-10-14 DIAGNOSIS — E039 Hypothyroidism, unspecified: Secondary | ICD-10-CM | POA: Diagnosis not present

## 2020-10-14 DIAGNOSIS — J449 Chronic obstructive pulmonary disease, unspecified: Secondary | ICD-10-CM | POA: Diagnosis not present

## 2020-10-14 DIAGNOSIS — Z951 Presence of aortocoronary bypass graft: Secondary | ICD-10-CM | POA: Diagnosis not present

## 2020-10-14 LAB — COMPREHENSIVE METABOLIC PANEL
ALT: 14 U/L (ref 0–44)
AST: 16 U/L (ref 15–41)
Albumin: 3.8 g/dL (ref 3.5–5.0)
Alkaline Phosphatase: 91 U/L (ref 38–126)
Anion gap: 14 (ref 5–15)
BUN: 12 mg/dL (ref 6–20)
CO2: 24 mmol/L (ref 22–32)
Calcium: 9.9 mg/dL (ref 8.9–10.3)
Chloride: 104 mmol/L (ref 98–111)
Creatinine, Ser: 0.74 mg/dL (ref 0.44–1.00)
GFR, Estimated: >60 mL/min (ref >60)
Glucose, Bld: 106 mg/dL — ABNORMAL HIGH (ref 70–99)
Potassium: 3.6 mmol/L (ref 3.5–5.1)
Sodium: 142 mmol/L (ref 135–145)
Total Bilirubin: 0.7 mg/dL (ref 0.3–1.2)
Total Protein: 7.3 g/dL (ref 6.5–8.1)

## 2020-10-14 LAB — CBC WITH DIFFERENTIAL/PLATELET
Abs Immature Granulocytes: 0.09 10*3/uL — ABNORMAL HIGH (ref 0.00–0.07)
Basophils Absolute: 0.1 10*3/uL (ref 0.0–0.1)
Basophils Relative: 1 %
Eosinophils Absolute: 0 10*3/uL (ref 0.0–0.5)
Eosinophils Relative: 0 %
HCT: 42.9 % (ref 36.0–46.0)
Hemoglobin: 13.9 g/dL (ref 12.0–15.0)
Immature Granulocytes: 1 %
Lymphocytes Relative: 19 %
Lymphs Abs: 2.5 10*3/uL (ref 0.7–4.0)
MCH: 28.7 pg (ref 26.0–34.0)
MCHC: 32.4 g/dL (ref 30.0–36.0)
MCV: 88.5 fL (ref 80.0–100.0)
Monocytes Absolute: 0.6 10*3/uL (ref 0.1–1.0)
Monocytes Relative: 5 %
Neutro Abs: 9.8 10*3/uL — ABNORMAL HIGH (ref 1.7–7.7)
Neutrophils Relative %: 74 %
Platelets: 550 10*3/uL — ABNORMAL HIGH (ref 150–400)
RBC: 4.85 MIL/uL (ref 3.87–5.11)
RDW: 16.6 % — ABNORMAL HIGH (ref 11.5–15.5)
WBC: 13.1 10*3/uL — ABNORMAL HIGH (ref 4.0–10.5)
nRBC: 0 % (ref 0.0–0.2)

## 2020-10-14 NOTE — ED Provider Notes (Signed)
Emergency Medicine Provider Triage Evaluation Note  Lindsay Rivera , a 60 y.o. female  was evaluated in triage.  Pt complains of vision problems balance issues that began 2 days ago.  She has a history of neurosurgery secondary to a intracranial tumor that was removed.  She also has a history of CVA in the past with residual left-sided numbness and weakness.   Review of Systems  Positive: Balance problems, blurred vision, loss of vision, focal weakness, focal numbness Negative: Fever, chills, syncope  Physical Exam  BP (!) 162/103 (BP Location: Left Arm)   Pulse 76   Temp 98.5 F (36.9 C) (Oral)   Resp 18   SpO2 100%  Gen:   Awake, no distress   Resp:  Normal effort  MSK:   Moves extremities without difficulty  Other:  There are clear visual deficits during peripheral field testing, subjective decrease in sensation to the left upper and left lower extremity, resting tremor of the left foot, 4/5 strength to the left upper and left lower extremity  Medical Decision Making  Medically screening exam initiated at 6:08 PM.  Appropriate orders placed.  Lindsay Rivera was informed that the remainder of the evaluation will be completed by another provider, this initial triage assessment does not replace that evaluation, and the importance of remaining in the ED until their evaluation is complete.  Left-sided residual deficits after prior stroke.  MRI ordered.   Hendricks Limes, PA-C 10/14/20 1811    [name], DO 10/14/20 2312

## 2020-10-14 NOTE — ED Notes (Signed)
Pt refuses to sign MSE at this time stating that she wants to eat and she knows that eating can affect her ability to get her MRI.

## 2020-10-14 NOTE — Progress Notes (Signed)
Called and spoke to triage nurse earlier. Pt is claustro, RN unable to give meds at that the time due.

## 2020-10-14 NOTE — ED Triage Notes (Signed)
Pt reports feeling off balance, shaky and, dizziness, nauseated x 2 days, fall x 2 since being d/c with R knee pain. Pt also reports bilat shaking . meningioma resection 8/26. Pt reports leaving hospital with L sided weakness now having R hand weakness. Speak clear.
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
## 2020-10-15 ENCOUNTER — Emergency Department (HOSPITAL_COMMUNITY): Payer: 59

## 2020-10-15 DIAGNOSIS — R531 Weakness: Secondary | ICD-10-CM | POA: Diagnosis not present

## 2020-10-15 LAB — URINALYSIS, ROUTINE W REFLEX MICROSCOPIC
Bilirubin Urine: NEGATIVE
Glucose, UA: NEGATIVE mg/dL
Hgb urine dipstick: NEGATIVE
Ketones, ur: NEGATIVE mg/dL
Leukocytes,Ua: NEGATIVE
Nitrite: NEGATIVE
Protein, ur: NEGATIVE mg/dL
Specific Gravity, Urine: 1.019 (ref 1.005–1.030)
pH: 5 (ref 5.0–8.0)

## 2020-10-15 MED ORDER — BACLOFEN 20 MG PO TABS
20.0000 mg | ORAL_TABLET | Freq: Once | ORAL | Status: AC
  Administered 2020-10-15: 20 mg via ORAL
  Filled 2020-10-15: qty 1

## 2020-10-15 MED ORDER — HYDROCODONE-ACETAMINOPHEN 5-325 MG PO TABS
1.0000 | ORAL_TABLET | Freq: Once | ORAL | Status: AC
Start: 2020-10-15 — End: 2020-10-15
  Administered 2020-10-15: 1 via ORAL
  Filled 2020-10-15: qty 1

## 2020-10-15 MED ORDER — OXYCODONE-ACETAMINOPHEN 5-325 MG PO TABS: 1.0000 | ORAL_TABLET | Freq: Four times a day (QID) | ORAL | 0 refills | Status: DC | PRN

## 2020-10-15 MED ORDER — HYDROCODONE-ACETAMINOPHEN 5-325 MG PO TABS
1.0000 | ORAL_TABLET | Freq: Once | ORAL | Status: AC
  Administered 2020-10-15: 1 via ORAL
  Filled 2020-10-15: qty 1

## 2020-10-15 MED ORDER — GABAPENTIN 300 MG PO CAPS
300.0000 mg | ORAL_CAPSULE | Freq: Once | ORAL | Status: AC
  Administered 2020-10-15: 300 mg via ORAL
  Filled 2020-10-15: qty 1

## 2020-10-15 MED ORDER — LORAZEPAM 2 MG/ML IJ SOLN
0.5000 mg | Freq: Once | INTRAMUSCULAR | Status: AC
  Administered 2020-10-15: 0.5 mg via INTRAVENOUS
  Filled 2020-10-15: qty 1

## 2020-10-15 NOTE — ED Notes (Signed)
MRI here for transport at this time

## 2020-10-15 NOTE — ED Notes (Signed)
Returned from MRI at this time.

## 2020-10-15 NOTE — Discharge Instructions (Addendum)
The cause of your weakness was not identified today.  Please call your family doctor for further evaluation.  Get rechecked if you have any new or concerning symptoms.

## 2020-10-15 NOTE — ED Notes (Signed)
Pt ambulated to the restroom with assistance.  

## 2020-10-15 NOTE — ED Notes (Signed)
Waiting for transport to admin medication for pretreatment prior to MRI

## 2020-10-15 NOTE — ED Notes (Signed)
C/o feeling off all week, falling, off balance, incontinent on self, lt leg jumping, forgetting things, unable to cry out of rt eye.

## 2020-10-15 NOTE — ED Notes (Signed)
Per pt she is asking for her prescribed pain medication that she was due it at 1500 but did not take it.  Pt takes Hydrocodone -Tylenol 5-'325mg'$  1 tab by mouth q6hrs, Baclofen '20mg'$  1-2 tabs by mouth TID PRN, Gabapentin '300mg'$  2cap by mouth TID. Provider sent a message reference same

## 2020-10-17 LAB — URINE CULTURE: Culture: >=100000 — AB

## 2020-10-18 ENCOUNTER — Telehealth: Payer: Self-pay | Admitting: *Deleted

## 2020-10-18 NOTE — Progress Notes (Signed)
ED Antimicrobial Stewardship Positive Culture Follow Up   Lindsay Rivera is an 60 y.o. female who presented to [facility] on 10/14/2020 with a chief complaint of  Chief Complaint  Patient presents with   balance issues    Recent Results (from the past 720 hour(s))  Urine Culture     Status: Abnormal   Collection Time: 10/15/20  5:10 AM   Specimen: Urine, Clean Catch  Result Value Ref Range Status   Specimen Description URINE, CLEAN CATCH  Final   Special Requests   Final    NONE Performed at [facility], [address] [place], [place] [address]    Culture >=100,000 COLONIES/mL ENTEROBACTER AEROGENES (A)  Final   Report Status 10/17/2020 FINAL  Final   Organism ID, Bacteria ENTEROBACTER AEROGENES (A)  Final      Susceptibility   Enterobacter aerogenes - MIC*    CEFAZOLIN >=64 RESISTANT Resistant     CEFEPIME <=0.12 SENSITIVE Sensitive     CEFTRIAXONE <=0.25 SENSITIVE Sensitive     CIPROFLOXACIN <=0.25 SENSITIVE Sensitive     GENTAMICIN <=1 SENSITIVE Sensitive     IMIPENEM 0.5 SENSITIVE Sensitive     NITROFURANTOIN 128 RESISTANT Resistant     TRIMETH/SULFA <=20 SENSITIVE Sensitive     PIP/TAZO <=4 SENSITIVE Sensitive     * >=100,000 COLONIES/mL ENTEROBACTER AEROGENES   '[x]'$  Patient discharged originally without antimicrobial agent and treatment is now indicated  New antibiotic prescription: Take 1 Bactrim DS tablet twice daily for 3 days (Qty 6; Refills 0)  ED Provider: Nanda Quinton, MD   Lorelei Pont, PharmD, BCPS 10/18/2020 12:37 PM ED Clinical Pharmacist -  [phone]

## 2020-10-18 NOTE — Telephone Encounter (Signed)
Post ED Visit - Positive Culture Follow-up: Successful Patient Follow-Up  Culture assessed and recommendations reviewed by:  '[]'$  Elenor Quinones, Pharm.D. '[]'$  Heide Guile, Pharm.D., BCPS AQ-ID '[]'$  Parks Neptune, Pharm.D., BCPS '[]'$  Alycia Rossetti, Pharm.D., BCPS '[]'$  [place], Pharm.D., BCPS, AAHIVP '[]'$  Legrand Como, Pharm.D., BCPS, AAHIVP '[]'$  Salome Arnt, PharmD, BCPS '[]'$  Johnnette Gourd, PharmD, BCPS '[]'$  Hughes Better, PharmD, BCPS '[]'$  Leeroy Cha, PharmD  Positive urine culture  '[x]'$  Patient discharged without antimicrobial prescription and treatment is now indicated '[]'$  Organism is resistant to prescribed ED discharge antimicrobial '[]'$  Patient with positive blood cultures  Changes discussed with ED provider:Joshua Long, MD New antibiotic prescription Bactrim DS 1 tab BID x 3 days Called to [facility], [place], [place] patient, date 10/18/2020, time [place], [place] 10/18/2020, 2:45 PM

## 2020-12-04 ENCOUNTER — Other Ambulatory Visit (HOSPITAL_COMMUNITY): Payer: Self-pay | Admitting: Geriatric Medicine

## 2020-12-04 ENCOUNTER — Other Ambulatory Visit (HOSPITAL_BASED_OUTPATIENT_CLINIC_OR_DEPARTMENT_OTHER): Payer: Self-pay | Admitting: Geriatric Medicine

## 2020-12-04 DIAGNOSIS — R1319 Other dysphagia: Secondary | ICD-10-CM

## 2020-12-11 ENCOUNTER — Ambulatory Visit (HOSPITAL_COMMUNITY)
Admission: RE | Admit: 2020-12-11 | Discharge: 2020-12-11 | Disposition: A | Discharge status: Home / Self Care | Payer: No Typology Code available for payment source | Source: Ambulatory Visit | Attending: Geriatric Medicine | Admitting: Geriatric Medicine

## 2020-12-11 ENCOUNTER — Other Ambulatory Visit: Payer: Self-pay

## 2020-12-11 DIAGNOSIS — R1319 Other dysphagia: Secondary | ICD-10-CM

## 2020-12-17 ENCOUNTER — Encounter (HOSPITAL_COMMUNITY): Payer: Self-pay

## 2020-12-17 ENCOUNTER — Inpatient Hospital Stay (HOSPITAL_COMMUNITY): Admission: RE | Admit: 2020-12-17 | Payer: 59 | Source: Ambulatory Visit

## 2021-01-08 ENCOUNTER — Encounter: Payer: 59 | Admitting: Physical Medicine and Rehabilitation

## 2021-05-10 ENCOUNTER — Other Ambulatory Visit: Payer: Self-pay | Admitting: Family Medicine

## 2021-05-10 DIAGNOSIS — Z87891 Personal history of nicotine dependence: Secondary | ICD-10-CM

## 2021-05-11 ENCOUNTER — Other Ambulatory Visit: Payer: Self-pay | Admitting: Family Medicine

## 2021-05-11 DIAGNOSIS — Z780 Asymptomatic menopausal state: Secondary | ICD-10-CM

## 2021-05-30 ENCOUNTER — Emergency Department (HOSPITAL_COMMUNITY)
Admission: EM | Admit: 2021-05-30 | Discharge: 2021-05-30 | Disposition: A | Discharge status: Home / Self Care | Payer: 59 | Attending: Emergency Medicine | Admitting: Emergency Medicine

## 2021-05-30 ENCOUNTER — Emergency Department (HOSPITAL_COMMUNITY): Payer: 59

## 2021-05-30 ENCOUNTER — Encounter (HOSPITAL_COMMUNITY): Payer: Self-pay

## 2021-05-30 ENCOUNTER — Other Ambulatory Visit: Payer: Self-pay

## 2021-05-30 DIAGNOSIS — I509 Heart failure, unspecified: Secondary | ICD-10-CM | POA: Diagnosis not present

## 2021-05-30 DIAGNOSIS — E119 Type 2 diabetes mellitus without complications: Secondary | ICD-10-CM | POA: Diagnosis not present

## 2021-05-30 DIAGNOSIS — Z7984 Long term (current) use of oral hypoglycemic drugs: Secondary | ICD-10-CM | POA: Diagnosis not present

## 2021-05-30 DIAGNOSIS — Z79899 Other long term (current) drug therapy: Secondary | ICD-10-CM | POA: Diagnosis not present

## 2021-05-30 DIAGNOSIS — Z955 Presence of coronary angioplasty implant and graft: Secondary | ICD-10-CM | POA: Insufficient documentation

## 2021-05-30 DIAGNOSIS — R0602 Shortness of breath: Secondary | ICD-10-CM | POA: Insufficient documentation

## 2021-05-30 DIAGNOSIS — Z91040 Latex allergy status: Secondary | ICD-10-CM | POA: Insufficient documentation

## 2021-05-30 DIAGNOSIS — I2510 Atherosclerotic heart disease of native coronary artery without angina pectoris: Secondary | ICD-10-CM | POA: Insufficient documentation

## 2021-05-30 DIAGNOSIS — I110 Hypertensive heart disease with heart failure: Secondary | ICD-10-CM | POA: Insufficient documentation

## 2021-05-30 DIAGNOSIS — R079 Chest pain, unspecified: Secondary | ICD-10-CM | POA: Insufficient documentation

## 2021-05-30 DIAGNOSIS — R059 Cough, unspecified: Secondary | ICD-10-CM | POA: Diagnosis not present

## 2021-05-30 LAB — BASIC METABOLIC PANEL
Anion gap: 5 (ref 5–15)
BUN: 13 mg/dL (ref 8–23)
CO2: 29 mmol/L (ref 22–32)
Calcium: 8.8 mg/dL — ABNORMAL LOW (ref 8.9–10.3)
Chloride: 109 mmol/L (ref 98–111)
Creatinine, Ser: 0.74 mg/dL (ref 0.44–1.00)
GFR, Estimated: >60 mL/min (ref >60)
Glucose, Bld: 89 mg/dL (ref 70–99)
Potassium: 3.3 mmol/L — ABNORMAL LOW (ref 3.5–5.1)
Sodium: 143 mmol/L (ref 135–145)

## 2021-05-30 LAB — CBC
HCT: 40 % (ref 36.0–46.0)
Hemoglobin: 13.3 g/dL (ref 12.0–15.0)
MCH: 28.7 pg (ref 26.0–34.0)
MCHC: 33.3 g/dL (ref 30.0–36.0)
MCV: 86.2 fL (ref 80.0–100.0)
Platelets: 367 10*3/uL (ref 150–400)
RBC: 4.64 MIL/uL (ref 3.87–5.11)
RDW: 15.5 % (ref 11.5–15.5)
WBC: 9.1 10*3/uL (ref 4.0–10.5)
nRBC: 0 % (ref 0.0–0.2)

## 2021-05-30 LAB — TROPONIN I (HIGH SENSITIVITY): Troponin I (High Sensitivity): 2 ng/L (ref <18)

## 2021-05-30 LAB — BRAIN NATRIURETIC PEPTIDE: B Natriuretic Peptide: 37 pg/mL (ref 0.0–100.0)

## 2021-05-30 MED ORDER — SODIUM CHLORIDE 0.9 % IV BOLUS
500.0000 mL | Freq: Once | INTRAVENOUS | Status: AC
  Administered 2021-05-30: 500 mL via INTRAVENOUS

## 2021-05-30 NOTE — ED Provider Notes (Incomplete)
?[facility] ?Provider Note ? ? ?CSN: [id number] ?Arrival date & time: 05/30/21  1756 ? ?  ? ?History ?{Add pertinent medical, surgical, social history, OB history to HPI:1} ?Chief Complaint  ?Patient presents with  ?? Chest Pain  ? ? ?Lindsay Rivera is a 61 y.o. female with a history most significant for prior acute MI with CABG in 2015, also history of CHF, hypertension, type 2 diabetes, left-sided CVA following brain surgery for excision of  meningioma last summer presenting for evaluation of intermittent left sided chest pain along with shortness of breath which reminds her of her symptoms prior to her previous MI.  She describes atypical symptoms of sharp and sometimes burning pain from her left fingertips up into her left chest region which has been intermittent, sometimes lasting a few seconds, but today lasting more than 10 minutes intermittently.  This is accompanied by exertional shortness of breath which she states is similar to her prior MI.  She is also had a cough productive of a clear sputum.  She denies fevers or chills.  Also denies diaphoresis, palpitations, nausea or vomiting peripheral edema.  Denies orthopnea.  She reports increased personal stressors which may be contributing to her symptoms.  She was at rest today when her symptoms developed right before arrival here.  She went to her neighbor's house who gave her 4 baby aspirin and called EMS.  She had a dose of nitroglycerin in route which resolved her pain and she is currently symptom-free.  Her blood pressure did get soft after receiving the nitro and was therefore given a fluid bolus. ? ?The history is provided by the patient.  ? ?  ? ?Home Medications ?Prior to Admission medications   ?Medication Sig Start Date End Date Taking? Authorizing Provider  ?acetaminophen (TYLENOL) 325 MG tablet Take 1-2 tablets (325-650 mg total) by mouth every 4 (four) hours as needed for mild pain. 09/24/20   Angiulli, Lavon Paganini, PA-C   ?albuterol (VENTOLIN HFA) 108 (90 Base) MCG/ACT inhaler Inhale 1-2 puffs into the lungs every 6 (six) hours as needed for wheezing or shortness of breath. 09/24/20   Angiulli, Lavon Paganini, PA-C  ?ALPRAZolam (XANAX) 0.5 MG tablet Take 1 tablet (0.5 mg total) by mouth 2 (two) times daily as needed for anxiety. 09/24/20   Angiulli, Lavon Paganini, PA-C  ?amLODipine (NORVASC) 10 MG tablet Take 1 tablet (10 mg total) by mouth daily. 09/24/20   Angiulli, Lavon Paganini, PA-C  ?Calcium Carbonate-Vitamin D 600-400 MG-UNIT chew tablet Chew 1 tablet by mouth daily. 09/24/20   Angiulli, Lavon Paganini, PA-C  ?carvedilol (COREG) 3.125 MG tablet Take 1 tablet (3.125 mg total) by mouth daily. 09/24/20   Angiulli, Lavon Paganini, PA-C  ?cetirizine (ZYRTEC) 10 MG tablet Take 10 mg by mouth at bedtime.    [provider]  ?docusate sodium (COLACE) 100 MG capsule Take 1 capsule (100 mg total) by mouth 2 (two) times daily. 09/07/20   Judith Part, MD  ?DULoxetine (CYMBALTA) 20 MG capsule Take 1 capsule (20 mg total) by mouth daily. 10/02/20   Raulkar, Clide Deutscher, MD  ?ergocalciferol (VITAMIN D2) 1.25 MG (50000 UT) capsule Take 1 capsule (50,000 Units total) by mouth once a week. 09/24/20   Angiulli, Lavon Paganini, PA-C  ?esomeprazole (NEXIUM) 40 MG capsule Take 1 capsule (40 mg total) by mouth daily at 12 noon. 09/24/20   Angiulli, Lavon Paganini, PA-C  ?FLUoxetine (PROZAC) 40 MG capsule Take 2 capsules (80 mg total) by mouth every  morning. 09/24/20   Angiulli, Lavon Paganini, PA-C  ?fluticasone (FLONASE) 50 MCG/ACT nasal spray Place 2 sprays into both nostrils daily. As needed for allergy symptoms ?Patient taking differently: Place 2 sprays into both nostrils daily as needed for allergies. 12/02/19   Fulp, Cammie, MD  ?gabapentin (NEURONTIN) 300 MG capsule Take 2 capsules (600 mg total) by mouth 3 (three) times daily. 09/24/20   Angiulli, Lavon Paganini, PA-C  ?HYDROcodone-acetaminophen (NORCO) 10-325 MG tablet Take 1-2 tablets by mouth every 4 (four) hours as needed for  moderate pain. 09/24/20   Angiulli, Lavon Paganini, PA-C  ?hydrOXYzine (ATARAX/VISTARIL) 25 MG tablet Take 1 tablet (25 mg total) by mouth 3 (three) times daily as needed for anxiety (For anxiety and sleep.). 09/24/20   Angiulli, Lavon Paganini, PA-C  ?levothyroxine (SYNTHROID) 112 MCG tablet TAKE 1 TABLET BY MOUTH ONCE A DAY 09/24/20 09/24/21  Angiulli, Lavon Paganini, PA-C  ?linaclotide (LINZESS) 290 MCG CAPS capsule Take 1 capsule (290 mcg total) by mouth daily before breakfast. 09/24/20   Angiulli, Lavon Paganini, PA-C  ?metFORMIN (GLUCOPHAGE-XR) 500 MG 24 hr tablet Take 1 tablet (500 mg total) by mouth daily with breakfast. 09/24/20   Angiulli, Lavon Paganini, PA-C  ?oxyCODONE-acetaminophen (PERCOCET/ROXICET) 5-325 MG tablet Take 1 tablet by mouth every 6 (six) hours as needed for severe pain. 10/15/20   Quintella Reichert, MD  ?polyethylene glycol (MIRALAX / GLYCOLAX) 17 g packet Take 17 g by mouth daily. 09/24/20   Angiulli, Lavon Paganini, PA-C  ?QUEtiapine (SEROQUEL) 100 MG tablet Take 1 tablet (100 mg total) by mouth at bedtime. 09/24/20   Angiulli, Lavon Paganini, PA-C  ?rosuvastatin (CRESTOR) 20 MG tablet Take 1 tablet (20 mg total) by mouth daily. 09/24/20 09/19/21  Angiulli, Lavon Paganini, PA-C  ?triamterene-hydrochlorothiazide (MAXZIDE-25) 37.5-25 MG tablet Take 1 tablet by mouth daily. 09/24/20   Angiulli, Lavon Paganini, PA-C  ?valACYclovir (VALTREX) 500 MG tablet Take 1 tablet (500 mg total) by mouth daily. 09/24/20   Angiulli, Lavon Paganini, PA-C  ?   ? ?Allergies    ?Penicillins, Iodine, Gadolinium derivatives, and Latex   ? ?Review of Systems   ?Review of Systems  ?Constitutional:  Negative for fever.  ?HENT:  Negative for congestion.   ?Eyes: Negative.   ?Respiratory:  Positive for cough and shortness of breath. Negative for chest tightness and wheezing.   ?Cardiovascular:  Positive for chest pain. Negative for palpitations and leg swelling.  ?Gastrointestinal:  Negative for abdominal pain, nausea and vomiting.  ?Genitourinary: Negative.   ?Musculoskeletal:  Negative  for arthralgias, joint swelling and neck pain.  ?Skin: Negative.  Negative for rash and wound.  ?Neurological:  Negative for dizziness, weakness, light-headedness, numbness and headaches.  ?Psychiatric/Behavioral: Negative.    ?All other systems reviewed and are negative. ? ?Physical Exam ?Updated Vital Signs ?BP 95/71 (BP Location: Left Arm)   Pulse (!) 58   Temp 97.9 ?F (36.6 ?C) (Oral)   Resp 17   Ht '5\' 2"'$  (1.575 m)   Wt 61.2 kg   SpO2 100%   BMI 24.69 kg/m?  ?Physical Exam ?Vitals and nursing note reviewed.  ?Constitutional:   ?   Appearance: She is well-developed.  ?HENT:  ?   Head: Normocephalic and atraumatic.  ?Eyes:  ?   Conjunctiva/sclera: Conjunctivae normal.  ?Cardiovascular:  ?   Rate and Rhythm: Normal rate and regular rhythm.  ?   Heart sounds: Normal heart sounds.  ?Pulmonary:  ?   Effort: Pulmonary effort is normal.  ?   Breath sounds: Normal  breath sounds. No wheezing or rhonchi.  ?Abdominal:  ?   General: Bowel sounds are normal.  ?   Palpations: Abdomen is soft.  ?   Tenderness: There is no abdominal tenderness.  ?Musculoskeletal:     ?   General: Normal range of motion.  ?   Cervical back: Normal range of motion.  ?   Right lower leg: No edema.  ?   Left lower leg: No edema.  ?Skin: ?   General: Skin is warm and dry.  ?Neurological:  ?   Mental Status: She is alert.  ? ? ?ED Results / Procedures / Treatments   ?Labs ?(all labs ordered are listed, but only abnormal results are displayed) ?Labs Reviewed  ?BASIC METABOLIC PANEL  ?CBC  ?TROPONIN I (HIGH SENSITIVITY)  ? ? ?EKG ?EKG Interpretation ? ?Date/Time:  Sunday May 30 2021 18:04:30 EDT ?Ventricular Rate:  57 ?PR Interval:  188 ?QRS Duration: 78 ?QT Interval:  452 ?QTC Calculation: 441 ?R Axis:   47 ?Text Interpretation: Sinus rhythm Left atrial enlargement Nonspecific T abnormalities, lateral leads No significant change since prior 9/22 Confirmed by Aletta Edouard [phone]) on 05/30/2021 6:08:50 PM ? ?Radiology ?No results  found. ? ?Procedures ?Procedures  ?{Document cardiac monitor, telemetry assessment procedure when appropriate:1} ? ?Medications Ordered in ED ?Medications  ?sodium chloride 0.9 % bolus 500 mL (has no administration in time range)  ?

## 2021-05-30 NOTE — Discharge Instructions (Signed)
You are seen in the emergency department for chest pain.  Your initial work-up did not show any obvious obvious explanation for your pain.  Unfortunately you did not stay for the completion of your work-up.  Please return to the emergency department if any worsening or concerning symptoms.  Follow-up with your primary care doctor and your cardiologist. ?

## 2021-05-30 NOTE — ED Notes (Signed)
Pt family to desk stating that she wants to leave. Does not want to stay all night. Has pcp apt tomorrow. Edp to room. IV removed. Ambulatory to lobby with family.  ?

## 2021-05-30 NOTE — ED Provider Notes (Signed)
?[facility] ?Provider Note ? ? ?CSN: [id number] ?Arrival date & time: 05/30/21  1756 ? ?  ? ?History ? ?Chief Complaint  ?Patient presents with  ? Chest Pain  ? ? ?Lindsay Rivera is a 61 y.o. female with a history most significant for prior acute MI with CABG in 2015, also history of CHF, hypertension, type 2 diabetes, left-sided CVA following brain surgery for excision of  meningioma last summer presenting for evaluation of intermittent left sided chest pain along with shortness of breath which reminds her of her symptoms prior to her previous MI.  She describes atypical symptoms of sharp and sometimes burning pain from her left fingertips up into her left chest region which has been intermittent, sometimes lasting a few seconds, but today lasting more than 10 minutes intermittently.  This is accompanied by exertional shortness of breath which she states is similar to her prior MI.  She is also had a cough productive of a clear sputum.  She denies fevers or chills.  Also denies diaphoresis, palpitations, nausea or vomiting peripheral edema.  Denies orthopnea.  She reports increased personal stressors which may be contributing to her symptoms.  She was at rest today when her symptoms developed right before arrival here.  She went to her neighbor's house who gave her 4 baby aspirin and called EMS.  She had a dose of nitroglycerin in route which resolved her pain and she is currently symptom-free.  Her blood pressure did get soft after receiving the nitro and was therefore given a fluid bolus. ? ?The history is provided by the patient.  ? ?11/29/17  cath results ? ?1. Single vessel occlusive CAD. 100% RCA after the RV marginal branch. The stent in the ostial LAD is patent. ?2. Atretic LIMA to the LAD ?3. Patent free RIMA to the diagonal ?4. Patent SVG to the distal RCA ?5. Normal LV function ?6. Normal LVEDP ?  ?Plan: medical management. ?  ?Recommend Aspirin '81mg'$  daily for moderate CAD ? ?Echo  01/13/16 - grade 2 diastolic dysfunction.  EF 65% per NM study 11/28/17. ? ?  ? ?Home Medications ?Prior to Admission medications   ?Medication Sig Start Date End Date Taking? Authorizing Provider  ?acetaminophen (TYLENOL) 325 MG tablet Take 1-2 tablets (325-650 mg total) by mouth every 4 (four) hours as needed for mild pain. 09/24/20  Yes Angiulli, Lavon Paganini, PA-C  ?albuterol (VENTOLIN HFA) 108 (90 Base) MCG/ACT inhaler Inhale 1-2 puffs into the lungs every 6 (six) hours as needed for wheezing or shortness of breath. 09/24/20  Yes Angiulli, Lavon Paganini, PA-C  ?ALPRAZolam (XANAX) 0.5 MG tablet Take 1 tablet (0.5 mg total) by mouth 2 (two) times daily as needed for anxiety. 09/24/20  Yes Angiulli, Lavon Paganini, PA-C  ?amLODipine (NORVASC) 10 MG tablet Take 1 tablet (10 mg total) by mouth daily. 09/24/20  Yes Angiulli, Lavon Paganini, PA-C  ?Calcium Carbonate-Vitamin D 600-400 MG-UNIT chew tablet Chew 1 tablet by mouth daily. 09/24/20  Yes Angiulli, Lavon Paganini, PA-C  ?cetirizine (ZYRTEC) 10 MG tablet Take 10 mg by mouth at bedtime.   Yes [provider]  ?clopidogrel (PLAVIX) 75 MG tablet Take 75 mg by mouth daily. 04/23/21  Yes [provider]  ?docusate sodium (COLACE) 100 MG capsule Take 1 capsule (100 mg total) by mouth 2 (two) times daily. 09/07/20  Yes Judith Part, MD  ?ergocalciferol (VITAMIN D2) 1.25 MG (50000 UT) capsule Take 1 capsule (50,000 Units total) by mouth once a week.  09/24/20  Yes Angiulli, Lavon Paganini, PA-C  ?esomeprazole (NEXIUM) 40 MG capsule Take 1 capsule (40 mg total) by mouth daily at 12 noon. 09/24/20  Yes Angiulli, Lavon Paganini, PA-C  ?FLUoxetine (PROZAC) 20 MG capsule Take 60 mg by mouth every morning. 04/30/21  Yes [provider]  ?fluticasone (FLONASE) 50 MCG/ACT nasal spray Place 2 sprays into both nostrils daily. As needed for allergy symptoms 12/02/19  Yes Fulp, Cammie, MD  ?hydrOXYzine (ATARAX) 50 MG tablet Take 50 mg by mouth at bedtime. 04/30/21  Yes [provider]   ?levothyroxine (SYNTHROID) 100 MCG tablet Take 100 mcg by mouth daily. 02/23/21  Yes [provider]  ?linaclotide Rolan Lipa) 290 MCG CAPS capsule Take 1 capsule (290 mcg total) by mouth daily before breakfast. 09/24/20  Yes Angiulli, Lavon Paganini, PA-C  ?metFORMIN (GLUCOPHAGE-XR) 500 MG 24 hr tablet Take 1 tablet (500 mg total) by mouth daily with breakfast. 09/24/20  Yes Angiulli, Lavon Paganini, PA-C  ?oxyCODONE-acetaminophen (PERCOCET) 10-325 MG tablet Take 1 tablet by mouth 2 (two) times daily as needed. 05/05/21  Yes [provider]  ?QUEtiapine (SEROQUEL) 100 MG tablet Take 1 tablet (100 mg total) by mouth at bedtime. 09/24/20  Yes Angiulli, Lavon Paganini, PA-C  ?rosuvastatin (CRESTOR) 20 MG tablet Take 1 tablet (20 mg total) by mouth daily. 09/24/20 09/19/21 Yes Angiulli, Lavon Paganini, PA-C  ?TRELEGY ELLIPTA 100-62.5-25 MCG/ACT AEPB Inhale 1 puff into the lungs daily. 02/22/21  Yes [provider]  ?triamterene-hydrochlorothiazide (MAXZIDE-25) 37.5-25 MG tablet Take 1 tablet by mouth daily. 09/24/20  Yes Angiulli, Lavon Paganini, PA-C  ?valACYclovir (VALTREX) 500 MG tablet Take 1 tablet (500 mg total) by mouth daily. 09/24/20  Yes Angiulli, Lavon Paganini, PA-C  ?carvedilol (COREG) 3.125 MG tablet Take 1 tablet (3.125 mg total) by mouth daily. ?Patient not taking: Reported on 05/30/2021 09/24/20   Cathlyn Parsons, PA-C  ?DULoxetine (CYMBALTA) 20 MG capsule Take 1 capsule (20 mg total) by mouth daily. ?Patient not taking: Reported on 05/30/2021 10/02/20   Izora Ribas, MD  ?FLUoxetine (PROZAC) 40 MG capsule Take 2 capsules (80 mg total) by mouth every morning. ?Patient not taking: Reported on 05/30/2021 09/24/20   Cathlyn Parsons, PA-C  ?gabapentin (NEURONTIN) 300 MG capsule Take 2 capsules (600 mg total) by mouth 3 (three) times daily. ?Patient not taking: Reported on 05/30/2021 09/24/20   Cathlyn Parsons, PA-C  ?HYDROcodone-acetaminophen (NORCO) 10-325 MG tablet Take 1-2 tablets by mouth every 4 (four) hours as  needed for moderate pain. ?Patient not taking: Reported on 05/30/2021 09/24/20   Cathlyn Parsons, PA-C  ?hydrOXYzine (ATARAX/VISTARIL) 25 MG tablet Take 1 tablet (25 mg total) by mouth 3 (three) times daily as needed for anxiety (For anxiety and sleep.). ?Patient not taking: Reported on 05/30/2021 09/24/20   Cathlyn Parsons, PA-C  ?levothyroxine (SYNTHROID) 112 MCG tablet TAKE 1 TABLET BY MOUTH ONCE A DAY ?Patient not taking: Reported on 05/30/2021 09/24/20 09/24/21  Angiulli, Lavon Paganini, PA-C  ?oxyCODONE-acetaminophen (PERCOCET/ROXICET) 5-325 MG tablet Take 1 tablet by mouth every 6 (six) hours as needed for severe pain. ?Patient not taking: Reported on 05/30/2021 10/15/20   Quintella Reichert, MD  ?polyethylene glycol (MIRALAX / GLYCOLAX) 17 g packet Take 17 g by mouth daily. ?Patient not taking: Reported on 05/30/2021 09/24/20   Cathlyn Parsons, PA-C  ?   ? ?Allergies    ?Penicillins, Iodine, Gadolinium derivatives, and Latex   ? ?Review of Systems   ?Review of Systems  ?Constitutional:  Negative for  fever.  ?HENT:  Negative for congestion.   ?Eyes: Negative.   ?Respiratory:  Positive for cough and shortness of breath. Negative for chest tightness and wheezing.   ?Cardiovascular:  Positive for chest pain. Negative for palpitations and leg swelling.  ?Gastrointestinal:  Negative for abdominal pain, nausea and vomiting.  ?Genitourinary: Negative.   ?Musculoskeletal:  Negative for arthralgias, joint swelling and neck pain.  ?Skin: Negative.  Negative for rash and wound.  ?Neurological:  Negative for dizziness, weakness, light-headedness, numbness and headaches.  ?Psychiatric/Behavioral: Negative.    ?All other systems reviewed and are negative. ? ?Physical Exam ?Updated Vital Signs ?BP 95/71 (BP Location: Left Arm)   Pulse (!) 58   Temp 97.9 ?F (36.6 ?C) (Oral)   Resp 17   Ht '5\' 2"'$  (1.575 m)   Wt 61.2 kg   SpO2 100%   BMI 24.69 kg/m?  ?Physical Exam ?Vitals and nursing note reviewed.  ?Constitutional:   ?    Appearance: She is well-developed.  ?HENT:  ?   Head: Normocephalic and atraumatic.  ?Eyes:  ?   Conjunctiva/sclera: Conjunctivae normal.  ?Cardiovascular:  ?   Rate and Rhythm: Normal rate and regular rhythm.  ?   Heart soun

## 2021-05-30 NOTE — ED Triage Notes (Signed)
Patient brought in by [facility] for chest pain.  States that it started 2 hours prior to calling and that the pain is radiating down her left arm.  Describes pain as sharp pains.  Patient has a history of Coronary artery bypass and CVA.  ?Patient received 1 nitro and 324 of ASA.  EMS states her BP dropped to 84/60 after 1st nitro.  400 mL bolus given.   ?

## 2021-06-08 ENCOUNTER — Ambulatory Visit
Admission: RE | Admit: 2021-06-08 | Discharge: 2021-06-08 | Disposition: A | Discharge status: Home / Self Care | Payer: 59 | Source: Ambulatory Visit | Attending: Family Medicine | Admitting: Family Medicine

## 2021-06-08 DIAGNOSIS — Z87891 Personal history of nicotine dependence: Secondary | ICD-10-CM

## 2021-06-14 ENCOUNTER — Institutional Professional Consult (permissible substitution): Payer: 59 | Admitting: Pulmonary Disease

## 2021-07-02 ENCOUNTER — Ambulatory Visit (INDEPENDENT_AMBULATORY_CARE_PROVIDER_SITE_OTHER): Payer: 59 | Admitting: Pulmonary Disease

## 2021-07-02 ENCOUNTER — Encounter: Payer: Self-pay | Admitting: Pulmonary Disease

## 2021-07-02 VITALS — BP 120/72 | HR 82 | Ht 62.0 in | Wt 133.0 lb

## 2021-07-02 DIAGNOSIS — R0602 Shortness of breath: Secondary | ICD-10-CM | POA: Diagnosis not present

## 2021-07-02 DIAGNOSIS — R053 Chronic cough: Secondary | ICD-10-CM

## 2021-07-02 MED ORDER — BREZTRI AEROSPHERE 160-9-4.8 MCG/ACT IN AERO: 2.0000 | INHALATION_SPRAY | Freq: Two times a day (BID) | RESPIRATORY_TRACT | 4 refills | Status: AC

## 2021-07-02 MED ORDER — ALBUTEROL SULFATE HFA 108 (90 BASE) MCG/ACT IN AERS: 1.0000 | INHALATION_SPRAY | Freq: Four times a day (QID) | RESPIRATORY_TRACT | 0 refills | Status: DC | PRN

## 2021-07-02 MED ORDER — PREDNISONE 20 MG PO TABS: 20.0000 mg | ORAL_TABLET | Freq: Every day | ORAL | 0 refills | Status: AC

## 2021-07-02 MED ORDER — DOXYCYCLINE HYCLATE 100 MG PO TABS: 100.0000 mg | ORAL_TABLET | Freq: Two times a day (BID) | ORAL | 0 refills | Status: DC

## 2021-07-02 NOTE — Patient Instructions (Signed)
Exacerbation of obstructive lung disease  Prescription for doxycycline Prescription for prednisone  Change to [facility] Prescription for albuterol  Repeat breathing study in 3 months  Follow-up 3 months from here  Continue using your CPAP nightly
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
## 2021-07-27 ENCOUNTER — Other Ambulatory Visit: Payer: Self-pay | Admitting: Cardiovascular Disease

## 2021-08-15 ENCOUNTER — Encounter: Payer: Self-pay | Admitting: Cardiovascular Disease
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
## 2021-08-17 ENCOUNTER — Ambulatory Visit (INDEPENDENT_AMBULATORY_CARE_PROVIDER_SITE_OTHER): Payer: 59 | Admitting: Cardiovascular Disease

## 2021-08-17 ENCOUNTER — Encounter: Payer: Self-pay | Admitting: Cardiovascular Disease

## 2021-08-17 VITALS — BP 118/70 | Ht 62.0 in | Wt 139.8 lb

## 2021-08-17 DIAGNOSIS — I25700 Atherosclerosis of coronary artery bypass graft(s), unspecified, with unstable angina pectoris: Secondary | ICD-10-CM | POA: Diagnosis not present

## 2021-08-17 DIAGNOSIS — Z01810 Encounter for preprocedural cardiovascular examination: Secondary | ICD-10-CM | POA: Diagnosis not present

## 2021-08-17 MED ORDER — PANTOPRAZOLE SODIUM 40 MG PO TBEC: 40.0000 mg | DELAYED_RELEASE_TABLET | Freq: Every day | ORAL | 3 refills | Status: DC

## 2021-08-17 MED ORDER — CARVEDILOL 3.125 MG PO TABS: 3.1250 mg | ORAL_TABLET | Freq: Every day | ORAL | 3 refills | Status: AC

## 2021-08-17 MED ORDER — TRIAMTERENE-HCTZ 37.5-25 MG PO TABS: 1.0000 | ORAL_TABLET | Freq: Every day | ORAL | 3 refills | Status: DC

## 2021-08-17 MED ORDER — ROSUVASTATIN CALCIUM 20 MG PO TABS: 20.0000 mg | ORAL_TABLET | Freq: Every day | ORAL | 3 refills | Status: DC

## 2021-08-17 MED ORDER — AMLODIPINE BESYLATE 10 MG PO TABS: 10.0000 mg | ORAL_TABLET | Freq: Every day | ORAL | 3 refills | Status: AC

## 2021-08-17 MED ORDER — NITROGLYCERIN 0.4 MG SL SUBL: 0.4000 mg | SUBLINGUAL_TABLET | SUBLINGUAL | 6 refills | Status: DC | PRN

## 2021-08-17 MED ORDER — CLOPIDOGREL BISULFATE 75 MG PO TABS: 75.0000 mg | ORAL_TABLET | Freq: Every day | ORAL | 3 refills | Status: DC

## 2021-08-17 NOTE — Patient Instructions (Addendum)
Medication Instructions:  STOP Nexium (Esomeprazole) START Protonix (Pantoprazole) REFILLED all cardiac medications *If you need a refill on your cardiac medications before your next appointment, please call your pharmacy*   Lab Work: LIPIDS, ALT, BMET Today If you have labs (blood work) drawn today and your tests are completely normal, you will receive your results only by: [place] (if you have MyChart) OR A paper copy in the mail If you have any lab test that is abnormal or we need to change your treatment, we will call you to review the results.   Testing/Procedures: NONE   Follow-Up: At [facility], you and your health needs are our priority.  As part of our continuing mission to provide you with exceptional heart care, we have created designated Provider Care Teams.  These Care Teams include your primary Cardiologist (physician) and Advanced Practice Providers (APPs -  Physician Assistants and Nurse Practitioners) who all work together to provide you with the care you need, when you need it.  We recommend signing up for the patient portal called "MyChart".  Sign up information is provided on this After Visit Summary.  MyChart is used to connect with patients for Virtual Visits (Telemedicine).  Patients are able to view lab/test results, encounter notes, upcoming appointments, etc.  Non-urgent messages can be sent to your provider as well.   To learn more about what you can do with MyChart, go to [email or web address].    Your next appointment:   1 year(s)  The format for your next appointment:   In Person  Provider:   Ronn Melena, or Nahser {    How to Prepare for Your Cardiac PET/CT Stress Test:  1. Please do not take these medications before your test:   Medications that may interfere with the cardiac pharmacological stress agent (ex. nitrates - including erectile dysfunction medications or beta-blockers) the day of the exam. (Erectile dysfunction  medication should be held for at least 72 hrs prior to test) Theophylline containing medications for 12 hours. Dipyridamole 48 hours prior to the test. Your remaining medications may be taken with water.  2. Nothing to eat or drink, except water, 3 hours prior to arrival time.   NO caffeine/decaffeinated products, or chocolate 12 hours prior to arrival.  3. NO perfume, cologne or lotion  4. Total time is 1 to 2 hours; you may want to bring reading material for the waiting time.  5. Please report to Admitting at the [facility] Main Entrance 60 minutes early for your test.  [place], [place] [address]  Diabetic Preparation:  Hold oral medications. You may take NPH and Lantus insulin. Do not take Humalog or Humulin R (Regular Insulin) the day of your test. Check blood sugars prior to leaving the house. If able to eat breakfast prior to 3 hour fasting, you may take all medications, including your insulin, Do not worry if you miss your breakfast dose of insulin - start at your next meal.  IF YOU THINK YOU MAY BE PREGNANT, OR ARE NURSING PLEASE INFORM THE TECHNOLOGIST.  In preparation for your appointment, medication and supplies will be purchased.  Appointment availability is limited, so if you need to cancel or reschedule, please call the Radiology Department at [phone]  24 hours in advance to avoid a cancellation fee of $100.00  What to Expect After you Arrive:  Once you arrive and check in for your appointment, you will be taken to a preparation room within the Radiology  Department.  A technologist or Nurse will obtain your medical history, verify that you are correctly prepped for the exam, and explain the procedure.  Afterwards,  an IV will be started in your arm and electrodes will be placed on your skin for EKG monitoring during the stress portion of the exam. Then you will be escorted to the PET/CT scanner.  There, staff will get you positioned on the  scanner and obtain a blood pressure and EKG.  During the exam, you will continue to be connected to the EKG and blood pressure machines.  A small, safe amount of a radioactive tracer will be injected in your IV to obtain a series of pictures of your heart along with an injection of a stress agent.    After your Exam:  It is recommended that you eat a meal and drink a caffeinated beverage to counter act any effects of the stress agent.  Drink plenty of fluids for the remainder of the day and urinate frequently for the first couple of hours after the exam.  Your doctor will inform you of your test results within 7-10 business days.  For questions about your test or how to prepare for your test, please call: Marchia Bond, Cardiac Imaging Nurse Navigator  Gordy Clement, Cardiac Imaging Nurse Navigator Office: [phone]   Important Information About Sugar

## 2021-08-18 LAB — LIPID PANEL
Chol/HDL Ratio: 3.9 ratio (ref 0.0–4.4)
Cholesterol, Total: 247 mg/dL — ABNORMAL HIGH (ref 100–199)
HDL: 64 mg/dL (ref >39)
LDL Chol Calc (NIH): 146 mg/dL — ABNORMAL HIGH (ref 0–99)
Triglycerides: 205 mg/dL — ABNORMAL HIGH (ref 0–149)
VLDL Cholesterol Cal: 37 mg/dL (ref 5–40)

## 2021-08-18 LAB — BASIC METABOLIC PANEL
BUN/Creatinine Ratio: 19 (ref 12–28)
BUN: 15 mg/dL (ref 8–27)
CO2: 22 mmol/L (ref 20–29)
Calcium: 10.6 mg/dL — ABNORMAL HIGH (ref 8.7–10.3)
Chloride: 100 mmol/L (ref 96–106)
Creatinine, Ser: 0.81 mg/dL (ref 0.57–1.00)
Glucose: 83 mg/dL (ref 70–99)
Potassium: 4.1 mmol/L (ref 3.5–5.2)
Sodium: 138 mmol/L (ref 134–144)
eGFR: 83 mL/min/{1.73_m2} (ref >59)

## 2021-08-18 LAB — ALT: ALT: 19 IU/L (ref 0–32)

## 2021-08-23 ENCOUNTER — Telehealth: Payer: Self-pay

## 2021-08-23 DIAGNOSIS — E782 Mixed hyperlipidemia: Secondary | ICD-10-CM

## 2021-08-23 DIAGNOSIS — I25700 Atherosclerosis of coronary artery bypass graft(s), unspecified, with unstable angina pectoris: Secondary | ICD-10-CM

## 2021-08-23 NOTE — Telephone Encounter (Signed)
-----   Message from Thayer Headings, MD sent at 08/20/2021  6:06 PM EDT ----- Please startDeborah has a history of coronary artery disease and coronary artery bypass grafting.   Start Rosuvastatin 20 mg a day.  Check lipids and ALT in 3 months.

## 2021-08-23 NOTE — Telephone Encounter (Signed)
Called and spoke to patient who passed the phone to her neighbor and asked that I relay information to her. Per patient, she has been awake since early this morning and cannot concentrate on things right now. Explained to individual on the phone that pt's cholesterol is elevated and she needs to be on a medication for it considering her history. She understands and will let patient know to pick up medication. Repeat labs scheduled for 3 months. Rosuvastatin already on her chart-just needs to be taking it.

## 2021-08-23 NOTE — Telephone Encounter (Signed)
Patient is calling back stating she is already currently taking her rosuvastatin daily, so she is wanting to know if there is another medication that needs to be prescribed to help with her cholesterol and symptoms. Please advise.

## 2021-08-24 NOTE — Addendum Note (Signed)
Addended by: Ma Hillock on: 08/24/2021 12:21 PM   Modules accepted: Orders

## 2021-08-24 NOTE — Addendum Note (Signed)
Addended by: Ma Hillock on: 08/24/2021 11:42 AM   Modules accepted: Orders

## 2021-08-24 NOTE — Telephone Encounter (Signed)
Corrected MD name on future lab order

## 2021-08-24 NOTE — Telephone Encounter (Signed)
Spoke with patient again. Advised her that the refill that was sent in on 08/17/21 was for '20mg'$  (what we had her on) per her request to refill cardiac meds, but it was thought by MD that she had run out and not been taking. Pt states she has been. Called pharmacy on file and spoke with pharmacist who verified that she filled Crestor '40mg'$  #90 on 12/07/20 from Dr Signa Kell, then #90 '20mg'$  tablets on 02/22/21 from Dr Delano Metz, then none until 08/06/21 when she refilled the '40mg'$  tabs again for #90. Patient states she hasn't missed any doses, but obviously has missed at minimum, 1.5 months. She states that with her brain surgery past, she cannot remember things well, and forgets while on phone with this RN that she is speaking to the cardiologist's office. Patient also endorses worsening chest pain over the last week, but then denies worsening, and states it's changed in nature to "shooting into her chest", stating that prior to this week, it's just been felt in her arm, but OV note shows that she described radiation to chest. Pt denies n/v, but endorses sweating episodes, then states it could be her thyroid. Pt states she took two nitroglycerin 4 days ago with that episode, that resolved the pain. States she took another single tablet this morning at 2:30am when she awoke with the pain. At time of call, states the pain is still there, but slightly weakened and she may take another one later if needed. Spoke with Dr Acie Fredrickson who agrees to have pt remain on Crestor '40mg'$  (pharmacy verified she has on hand, as well as, the patient) and we will re-check her lipids in 3 months. If at that time, her lipids remain elevated, we will refer to lipid clinic as Rosuvastatin is not working. Cancelling our RX sent in on 08/17/21 at this time (it was for decreased dose). Recommendation for the chest pain is that pt go to ED for evaluation. Hard to determine if this is unchanged from visit, but she should be seen for evaluation where testing  can be done conclusively. PET scan was ordered in office one week ago, but still awaiting scheduling.  Patient states that she understands all above information.

## 2021-09-29 ENCOUNTER — Encounter: Payer: Self-pay | Admitting: Cardiovascular Disease

## 2021-09-29 ENCOUNTER — Telehealth: Payer: Self-pay

## 2021-09-29 NOTE — Telephone Encounter (Signed)
Created a letter with instructions and mailed to patient since she does not have an active MyChart account.

## 2021-09-29 NOTE — Telephone Encounter (Signed)
-----   Message from Thayer Headings, MD sent at 09/27/2021  6:29 PM EDT ----- Regarding: FW: Cardiac PET  ----- Message ----- From: Leanord Asal Sent: 09/27/2021  11:03 AM EDT To: Thayer Headings, MD Subject: Cardiac PET                                    Hello,  We scheduled the pt for the cardiac PET procedure on October, 3 '@1100'$ . She stated she just had brain surgery and she need written instructions to view of the pre-preps for this procedure. Can someone please contact her and explain the pre-preps or send her the instructions so she'll be ready for the procedure on October 3rd?    Thank you!

## 2021-10-12 ENCOUNTER — Other Ambulatory Visit: Payer: Self-pay

## 2021-10-12 DIAGNOSIS — R0602 Shortness of breath: Secondary | ICD-10-CM

## 2021-10-13 ENCOUNTER — Ambulatory Visit: Payer: 59 | Admitting: Pulmonary Disease

## 2021-11-05 ENCOUNTER — Telehealth (HOSPITAL_COMMUNITY): Payer: Self-pay | Admitting: Emergency Medicine

## 2021-11-05 NOTE — Telephone Encounter (Signed)
Reaching out to patient to offer assistance regarding upcoming cardiac imaging study; pt verbalizes understanding of appt date/time, parking situation and where to check in, pre-test NPO status and medications ordered, and verified current allergies; name and call back number provided for further questions should they arise Matsuko Kretz RN Navigator Cardiac Imaging [facility] [phone] office [phone] cell 

## 2021-11-09 ENCOUNTER — Other Ambulatory Visit (HOSPITAL_COMMUNITY): Payer: 59

## 2021-11-09 ENCOUNTER — Encounter (HOSPITAL_COMMUNITY): Admission: RE | Admit: 2021-11-09 | Payer: 59 | Source: Ambulatory Visit

## 2021-11-23 ENCOUNTER — Ambulatory Visit: Payer: 59 | Attending: Cardiovascular Disease

## 2021-11-23 DIAGNOSIS — I25700 Atherosclerosis of coronary artery bypass graft(s), unspecified, with unstable angina pectoris: Secondary | ICD-10-CM

## 2021-11-23 DIAGNOSIS — E782 Mixed hyperlipidemia: Secondary | ICD-10-CM

## 2021-11-23 LAB — LIPID PANEL
Chol/HDL Ratio: 3.1 ratio (ref 0.0–4.4)
Cholesterol, Total: 212 mg/dL — ABNORMAL HIGH (ref 100–199)
HDL: 68 mg/dL (ref >39)
LDL Chol Calc (NIH): 118 mg/dL — ABNORMAL HIGH (ref 0–99)
Triglycerides: 148 mg/dL (ref 0–149)
VLDL Cholesterol Cal: 26 mg/dL (ref 5–40)

## 2021-11-23 LAB — ALT: ALT: 18 IU/L (ref 0–32)

## 2021-11-29 ENCOUNTER — Telehealth: Payer: Self-pay

## 2021-11-29 DIAGNOSIS — Z79899 Other long term (current) drug therapy: Secondary | ICD-10-CM

## 2021-11-29 DIAGNOSIS — E782 Mixed hyperlipidemia: Secondary | ICD-10-CM

## 2021-11-29 MED ORDER — EZETIMIBE 10 MG PO TABS: 10.0000 mg | ORAL_TABLET | Freq: Every day | ORAL | 3 refills | Status: DC

## 2021-11-29 NOTE — Telephone Encounter (Signed)
Called and spoke with patient who agrees to recommendation to begin zetia and repeat labs in 3 months. Labs scheduled for 03/02/22.

## 2021-11-29 NOTE — Telephone Encounter (Signed)
-----   Message from Thayer Headings, MD sent at 11/26/2021  4:50 PM EDT ----- LDL is better but still not at her goal of 55. Lets add zetia 10 mg a day . Recheck lipids and ALT in 3 months. I would have a low threshold to refer her to the lipid clinic for consideration of PCSK9 inhibitor if she cannot get to goal

## 2022-01-07 ENCOUNTER — Ambulatory Visit: Payer: 59 | Admitting: Pulmonary Disease

## 2022-01-07 ENCOUNTER — Ambulatory Visit: Payer: 59

## 2022-01-22 ENCOUNTER — Emergency Department (HOSPITAL_COMMUNITY)
Admission: EM | Admit: 2022-01-22 | Discharge: 2022-01-22 | Disposition: A | Discharge status: Home / Self Care | Payer: 59 | Attending: Emergency Medicine | Admitting: Emergency Medicine

## 2022-01-22 ENCOUNTER — Emergency Department (HOSPITAL_COMMUNITY): Payer: 59

## 2022-01-22 ENCOUNTER — Encounter (HOSPITAL_COMMUNITY): Payer: Self-pay

## 2022-01-22 ENCOUNTER — Other Ambulatory Visit: Payer: Self-pay

## 2022-01-22 DIAGNOSIS — R079 Chest pain, unspecified: Secondary | ICD-10-CM | POA: Diagnosis present

## 2022-01-22 DIAGNOSIS — Z1152 Encounter for screening for COVID-19: Secondary | ICD-10-CM | POA: Diagnosis not present

## 2022-01-22 DIAGNOSIS — Z79899 Other long term (current) drug therapy: Secondary | ICD-10-CM | POA: Insufficient documentation

## 2022-01-22 DIAGNOSIS — Z7951 Long term (current) use of inhaled steroids: Secondary | ICD-10-CM | POA: Diagnosis not present

## 2022-01-22 DIAGNOSIS — I10 Essential (primary) hypertension: Secondary | ICD-10-CM | POA: Diagnosis not present

## 2022-01-22 DIAGNOSIS — R053 Chronic cough: Secondary | ICD-10-CM | POA: Insufficient documentation

## 2022-01-22 DIAGNOSIS — E119 Type 2 diabetes mellitus without complications: Secondary | ICD-10-CM | POA: Diagnosis not present

## 2022-01-22 DIAGNOSIS — I2510 Atherosclerotic heart disease of native coronary artery without angina pectoris: Secondary | ICD-10-CM | POA: Insufficient documentation

## 2022-01-22 DIAGNOSIS — J449 Chronic obstructive pulmonary disease, unspecified: Secondary | ICD-10-CM | POA: Diagnosis not present

## 2022-01-22 DIAGNOSIS — E039 Hypothyroidism, unspecified: Secondary | ICD-10-CM | POA: Insufficient documentation

## 2022-01-22 DIAGNOSIS — R0789 Other chest pain: Secondary | ICD-10-CM | POA: Diagnosis not present

## 2022-01-22 LAB — CBC
HCT: 42.3 % (ref 36.0–46.0)
Hemoglobin: 13.7 g/dL (ref 12.0–15.0)
MCH: 27.5 pg (ref 26.0–34.0)
MCHC: 32.4 g/dL (ref 30.0–36.0)
MCV: 84.9 fL (ref 80.0–100.0)
Platelets: 630 10*3/uL — ABNORMAL HIGH (ref 150–400)
RBC: 4.98 MIL/uL (ref 3.87–5.11)
RDW: 16 % — ABNORMAL HIGH (ref 11.5–15.5)
WBC: 12.6 10*3/uL — ABNORMAL HIGH (ref 4.0–10.5)
nRBC: 0 % (ref 0.0–0.2)

## 2022-01-22 LAB — BASIC METABOLIC PANEL
Anion gap: 10 (ref 5–15)
BUN: 15 mg/dL (ref 8–23)
CO2: 26 mmol/L (ref 22–32)
Calcium: 10.1 mg/dL (ref 8.9–10.3)
Chloride: 104 mmol/L (ref 98–111)
Creatinine, Ser: 0.79 mg/dL (ref 0.44–1.00)
GFR, Estimated: >60 mL/min (ref >60)
Glucose, Bld: 96 mg/dL (ref 70–99)
Potassium: 3.4 mmol/L — ABNORMAL LOW (ref 3.5–5.1)
Sodium: 140 mmol/L (ref 135–145)

## 2022-01-22 LAB — D-DIMER, QUANTITATIVE: D-Dimer, Quant: 0.98 ug/mL-FEU — ABNORMAL HIGH (ref 0.00–0.50)

## 2022-01-22 LAB — TROPONIN I (HIGH SENSITIVITY)
Troponin I (High Sensitivity): 3 ng/L (ref <18)
Troponin I (High Sensitivity): 3 ng/L (ref <18)

## 2022-01-22 LAB — RESP PANEL BY RT-PCR (RSV, FLU A&B, COVID)  RVPGX2
Influenza A by PCR: NEGATIVE
Influenza B by PCR: NEGATIVE
Resp Syncytial Virus by PCR: NEGATIVE
SARS Coronavirus 2 by RT PCR: NEGATIVE

## 2022-01-22 MED ORDER — MORPHINE SULFATE (PF) 4 MG/ML IV SOLN
4.0000 mg | Freq: Once | INTRAVENOUS | Status: AC
  Administered 2022-01-22: 4 mg via INTRAVENOUS
  Filled 2022-01-22: qty 1

## 2022-01-22 MED ORDER — KETOROLAC TROMETHAMINE 30 MG/ML IJ SOLN
30.0000 mg | Freq: Once | INTRAMUSCULAR | Status: AC
  Administered 2022-01-22: 30 mg via INTRAVENOUS
  Filled 2022-01-22: qty 1

## 2022-01-22 MED ORDER — IBUPROFEN 600 MG PO TABS: 600.0000 mg | ORAL_TABLET | Freq: Four times a day (QID) | ORAL | 0 refills | Status: DC | PRN

## 2022-01-22 MED ORDER — FLUCONAZOLE 150 MG PO TABS
150.0000 mg | ORAL_TABLET | Freq: Once | ORAL | Status: AC
  Administered 2022-01-22: 150 mg via ORAL
  Filled 2022-01-22: qty 1

## 2022-01-22 MED ORDER — ONDANSETRON HCL 4 MG/2ML IJ SOLN
4.0000 mg | Freq: Once | INTRAMUSCULAR | Status: AC
  Administered 2022-01-22: 4 mg via INTRAVENOUS
  Filled 2022-01-22: qty 2

## 2022-01-22 MED ORDER — HYDROCODONE-ACETAMINOPHEN 5-325 MG PO TABS: 1.0000 | ORAL_TABLET | ORAL | 0 refills | Status: DC | PRN

## 2022-01-22 NOTE — ED Provider Notes (Signed)
[facility] EMERGENCY DEPARTMENT Provider Note   CSN: [id number] Arrival date & time: 01/22/22  1854     History  Chief Complaint  Patient presents with   Chest Pain    Lindsay Rivera is a 61 y.o. female.  Pt is a 61 yo female with pmhx significant for depression, bipolar d/o, htn, anxiety, gerd, hld, chf, anemia, CAD, hypothyroidism, dm2, arthritis, cva, copd, sleep apnea, and hx of a meningioma rsxn.  Pt has had a chronic cough for over a year.  She feels like it's gotten worse over the last week.  She also has pain in her chest when she takes a deep breath or moves.  She denies fevers.  She has seen pulmonology for her chronic cough.       Home Medications Prior to Admission medications   Medication Sig Start Date End Date Taking? Authorizing Provider  HYDROcodone-acetaminophen (NORCO/VICODIN) 5-325 MG tablet Take 1 tablet by mouth every 4 (four) hours as needed. 01/22/22  Yes Isla Pence, MD  ibuprofen (ADVIL) 600 MG tablet Take 1 tablet (600 mg total) by mouth every 6 (six) hours as needed. 01/22/22  Yes Isla Pence, MD  acetaminophen (TYLENOL) 325 MG tablet Take 1-2 tablets (325-650 mg total) by mouth every 4 (four) hours as needed for mild pain. 09/24/20   Angiulli, Lavon Paganini, PA-C  ALPRAZolam Duanne Moron) 0.5 MG tablet Take 1 tablet (0.5 mg total) by mouth 2 (two) times daily as needed for anxiety. 09/24/20   Angiulli, Lavon Paganini, PA-C  amLODipine (NORVASC) 10 MG tablet Take 1 tablet (10 mg total) by mouth daily. 08/17/21   Nahser, Wonda Cheng, MD  Budeson-Glycopyrrol-Formoterol (BREZTRI AEROSPHERE) 160-9-4.8 MCG/ACT AERO Inhale 2 puffs into the lungs in the morning and at bedtime. 07/02/21   Laurin Coder, MD  Calcium Carbonate-Vitamin D 600-400 MG-UNIT chew tablet Chew 1 tablet by mouth daily. 09/24/20   Angiulli, Lavon Paganini, PA-C  carvedilol (COREG) 3.125 MG tablet Take 1 tablet (3.125 mg total) by mouth daily. 08/17/21   Nahser, Wonda Cheng, MD  cetirizine (ZYRTEC) 10 MG tablet  Take 10 mg by mouth at bedtime.    [provider]  clopidogrel (PLAVIX) 75 MG tablet Take 1 tablet (75 mg total) by mouth daily. 08/17/21   Nahser, Wonda Cheng, MD  DULoxetine (CYMBALTA) 20 MG capsule Take 1 capsule (20 mg total) by mouth daily. 10/02/20   Raulkar, Clide Deutscher, MD  ergocalciferol (VITAMIN D2) 1.25 MG (50000 UT) capsule Take 1 capsule (50,000 Units total) by mouth once a week. 09/24/20   Angiulli, Lavon Paganini, PA-C  ezetimibe (ZETIA) 10 MG tablet Take 1 tablet (10 mg total) by mouth daily. 11/29/21   Nahser, Wonda Cheng, MD  FLUoxetine (PROZAC) 20 MG capsule Take 60 mg by mouth every morning. Patient not taking: Reported on 08/17/2021 04/30/21   [provider]  FLUoxetine (PROZAC) 40 MG capsule Take 2 capsules (80 mg total) by mouth every morning. Patient not taking: Reported on 08/17/2021 09/24/20   Angiulli, Lavon Paganini, PA-C  fluticasone [facility]) 50 MCG/ACT nasal spray Place 2 sprays into both nostrils daily. As needed for allergy symptoms 12/02/19   Fulp, Cammie, MD  gabapentin (NEURONTIN) 300 MG capsule Take 2 capsules (600 mg total) by mouth 3 (three) times daily. Patient not taking: Reported on 08/17/2021 09/24/20   Angiulli, Lavon Paganini, PA-C  hydrOXYzine (ATARAX) 50 MG tablet Take 50 mg by mouth at bedtime. 04/30/21   [provider]  levothyroxine (SYNTHROID) 100 MCG tablet Take  100 mcg by mouth daily. Patient not taking: Reported on 08/17/2021 02/23/21   [provider]  levothyroxine (SYNTHROID) 112 MCG tablet TAKE 1 TABLET BY MOUTH ONCE A DAY Patient not taking: Reported on 08/17/2021 09/24/20 09/24/21  Angiulli, Lavon Paganini, PA-C  levothyroxine (SYNTHROID) 88 MCG tablet Take 88 mcg by mouth daily. 08/11/21   [provider]  linaclotide Rolan Lipa) 290 MCG CAPS capsule Take 1 capsule (290 mcg total) by mouth daily before breakfast. 09/24/20   Angiulli, Lavon Paganini, PA-C  metFORMIN (GLUCOPHAGE-XR) 500 MG 24 hr tablet Take 1 tablet (500 mg total) by mouth daily  with breakfast. Patient not taking: Reported on 08/17/2021 09/24/20   Angiulli, Lavon Paganini, PA-C  nitroGLYCERIN (NITROSTAT) 0.4 MG SL tablet Place 1 tablet (0.4 mg total) under the tongue every 5 (five) minutes as needed for chest pain (Max of 3 doses, then 911). 08/17/21   Nahser, Wonda Cheng, MD  oxyCODONE-acetaminophen (PERCOCET) 10-325 MG tablet Take 1 tablet by mouth 2 (two) times daily as needed. 05/05/21   [provider]  pantoprazole (PROTONIX) 40 MG tablet Take 1 tablet (40 mg total) by mouth daily. 08/17/21   Nahser, Wonda Cheng, MD  polyethylene glycol (MIRALAX / GLYCOLAX) 17 g packet Take 17 g by mouth daily. Patient not taking: Reported on 08/17/2021 09/24/20   Angiulli, Lavon Paganini, PA-C  QUEtiapine (SEROQUEL) 100 MG tablet Take 1 tablet (100 mg total) by mouth at bedtime. 09/24/20   Angiulli, Lavon Paganini, PA-C  TRELEGY ELLIPTA 100-62.5-25 MCG/ACT AEPB Inhale 1 puff into the lungs daily. 02/22/21   [provider]  triamterene-hydrochlorothiazide (MAXZIDE-25) 37.5-25 MG tablet Take 1 tablet by mouth daily. 08/17/21   Nahser, Wonda Cheng, MD  valACYclovir (VALTREX) 500 MG tablet Take 1 tablet (500 mg total) by mouth daily. 09/24/20   Angiulli, Lavon Paganini, PA-C      Allergies    Penicillins, Gadolinium derivatives, Iodine, and Latex    Review of Systems   Review of Systems  Respiratory:  Positive for cough.   Cardiovascular:  Positive for chest pain.  All other systems reviewed and are negative.   Physical Exam Updated Vital Signs BP 95/79   Pulse 64   Temp 98.2 F (36.8 C) (Oral)   Resp 18   Ht '5\' 2"'$  (1.575 m)   Wt 67.1 kg   SpO2 96%   BMI 27.07 kg/m  Physical Exam Vitals and nursing note reviewed.  Constitutional:      Appearance: Normal appearance.  HENT:     Head: Normocephalic and atraumatic.     Right Ear: External ear normal.     Left Ear: External ear normal.     Nose: Nose normal.     Mouth/Throat:     Mouth: Mucous membranes are moist.     Pharynx: Oropharynx  is clear.  Eyes:     Extraocular Movements: Extraocular movements intact.     Conjunctiva/sclera: Conjunctivae normal.     Pupils: Pupils are equal, round, and reactive to light.  Cardiovascular:     Rate and Rhythm: Normal rate and regular rhythm.     Pulses: Normal pulses.     Heart sounds: Normal heart sounds.  Pulmonary:     Effort: Pulmonary effort is normal.     Breath sounds: Normal breath sounds.  Chest:     Chest wall: Tenderness present.     Comments: Tenderness to palpation all over chest Abdominal:     General: Abdomen is flat. Bowel sounds are normal.  Palpations: Abdomen is soft.  Musculoskeletal:        General: Normal range of motion.     Cervical back: Normal range of motion and neck supple.  Skin:    General: Skin is warm.     Capillary Refill: Capillary refill takes less than 2 seconds.  Neurological:     General: No focal deficit present.     Mental Status: She is alert and oriented to person, place, and time.  Psychiatric:        Mood and Affect: Mood is anxious. Affect is tearful.     ED Results / Procedures / Treatments   Labs (all labs ordered are listed, but only abnormal results are displayed) Labs Reviewed  BASIC METABOLIC PANEL - Abnormal; Notable for the following components:      Result Value   Potassium 3.4 (*)    All other components within normal limits  CBC - Abnormal; Notable for the following components:   WBC 12.6 (*)    RDW 16.0 (*)    Platelets 630 (*)    All other components within normal limits  D-DIMER, QUANTITATIVE - Abnormal; Notable for the following components:   D-Dimer, Quant 0.98 (*)    All other components within normal limits  RESP PANEL BY RT-PCR (RSV, FLU A&B, COVID)  RVPGX2  TROPONIN I (HIGH SENSITIVITY)  TROPONIN I (HIGH SENSITIVITY)    EKG EKG Interpretation  Date/Time:  Saturday January 22 2022 19:13:12 EST Ventricular Rate:  74 PR Interval:  179 QRS Duration: 86 QT Interval:  392 QTC  Calculation: 435 R Axis:   50 Text Interpretation: Sinus rhythm Left atrial enlargement Anteroseptal infarct, age indeterminate No significant change since last tracing Confirmed by Isla Pence [phone]) on 01/22/2022 7:33:10 PM  Radiology DG Chest Port 1 View  Result Date: 01/22/2022 CLINICAL DATA:  Chest pain, chronic cough EXAM: PORTABLE CHEST 1 VIEW COMPARISON:  05/30/2021 FINDINGS: Lungs are clear.  No pleural effusion or pneumothorax. The heart is normal in size. IMPRESSION: No evidence of acute cardiopulmonary disease. Electronically Signed   By: Julian Hy M.D.   On: 01/22/2022 19:50    Procedures Procedures    Medications Ordered in ED Medications  fluconazole (DIFLUCAN) tablet 150 mg (has no administration in time range)  morphine (PF) 4 MG/ML injection 4 mg (4 mg Intravenous Given 01/22/22 1958)  ondansetron (ZOFRAN) injection 4 mg (4 mg Intravenous Given 01/22/22 1957)  ketorolac (TORADOL) 30 MG/ML injection 30 mg (30 mg Intravenous Given 01/22/22 2126)  morphine (PF) 4 MG/ML injection 4 mg (4 mg Intravenous Given 01/22/22 2126)  morphine (PF) 4 MG/ML injection 4 mg (4 mg Intravenous Given 01/22/22 2254)    ED Course/ Medical Decision Making/ A&P                           Medical Decision Making Amount and/or Complexity of Data Reviewed Labs: ordered. Radiology: ordered.  Risk Prescription drug management.   This patient presents to the ED for concern of cp, this involves an extensive number of treatment options, and is a complaint that carries with it a high risk of complications and morbidity.  The differential diagnosis includes cardiac, pulm, gi, msk   Co morbidities that complicate the patient evaluation  depression, bipolar d/o, htn, anxiety, gerd, hld, chf, anemia, CAD, hypothyroidism, dm2, arthritis, cva, copd, sleep apnea, and hx of a meningioma rsxn   Additional history obtained:  Additional history obtained from epic chart  review    Lab Tests:  I Ordered, and personally interpreted labs.  The pertinent results include:  cbc nl, bmp nl, trop nl, covid/flu neg, ddimer sl elevated   Imaging Studies ordered:  I ordered imaging studies including cxr  I independently visualized and interpreted imaging which showed No evidence of acute cardiopulmonary disease.  I agree with the radiologist interpretation   Cardiac Monitoring:  The patient was maintained on a cardiac monitor.  I personally viewed and interpreted the cardiac monitored which showed an underlying rhythm of: nsr   Medicines ordered and prescription drug management:  I ordered medication including morphine and toradol  for pain  Reevaluation of the patient after these medicines showed that the patient improved I have reviewed the patients home medicines and have made adjustments as needed   Test Considered:  Ct, but pt is not hypoxic or tachycardic.  CP has been going on for a long time and appears to be msk in nature.   Critical Interventions:  Pain control    Problem List / ED Course:  Chest wall pain:  likely msk.  Pt is feeling better.  She is stable for d/c.  Return if worse.   Reevaluation:  After the interventions noted above, I reevaluated the patient and found that they have :improved   Social Determinants of Health:  Lives at home   Dispostion:  After consideration of the diagnostic results and the patients response to treatment, I feel that the patent would benefit from discharge with outpatient tx.          Final Clinical Impression(s) / ED Diagnoses Final diagnoses:  Chest wall pain    Rx / DC Orders ED Discharge Orders          Ordered    HYDROcodone-acetaminophen (NORCO/VICODIN) 5-325 MG tablet  Every 4 hours PRN        01/22/22 2306    ibuprofen (ADVIL) 600 MG tablet  Every 6 hours PRN        01/22/22 2306              Isla Pence, MD 01/22/22 2309

## 2022-01-22 NOTE — ED Notes (Signed)
Pt has consumed ice chips, peanut butter, and crackers

## 2022-01-22 NOTE — ED Triage Notes (Signed)
Patient brought in by [facility] for right upper chest pain.  Patient has had persistent cough for over a year but states that it has gotten worse over the past week and with movement chest hurts.

## 2022-02-04 ENCOUNTER — Ambulatory Visit (INDEPENDENT_AMBULATORY_CARE_PROVIDER_SITE_OTHER): Payer: 59 | Admitting: Nurse Practitioner

## 2022-02-04 ENCOUNTER — Other Ambulatory Visit: Payer: Self-pay | Admitting: *Deleted

## 2022-02-04 ENCOUNTER — Ambulatory Visit (INDEPENDENT_AMBULATORY_CARE_PROVIDER_SITE_OTHER): Payer: 59 | Admitting: Pulmonary Disease

## 2022-02-04 ENCOUNTER — Encounter: Payer: Self-pay | Admitting: Nurse Practitioner

## 2022-02-04 VITALS — BP 120/80 | HR 80 | Ht 63.0 in | Wt 147.8 lb

## 2022-02-04 DIAGNOSIS — F3130 Bipolar disorder, current episode depressed, mild or moderate severity, unspecified: Secondary | ICD-10-CM | POA: Diagnosis not present

## 2022-02-04 DIAGNOSIS — I25700 Atherosclerosis of coronary artery bypass graft(s), unspecified, with unstable angina pectoris: Secondary | ICD-10-CM

## 2022-02-04 DIAGNOSIS — J4540 Moderate persistent asthma, uncomplicated: Secondary | ICD-10-CM | POA: Diagnosis not present

## 2022-02-04 DIAGNOSIS — J984 Other disorders of lung: Secondary | ICD-10-CM | POA: Diagnosis not present

## 2022-02-04 DIAGNOSIS — F329 Major depressive disorder, single episode, unspecified: Secondary | ICD-10-CM | POA: Diagnosis not present

## 2022-02-04 DIAGNOSIS — R0602 Shortness of breath: Secondary | ICD-10-CM

## 2022-02-04 DIAGNOSIS — Z0000 Encounter for general adult medical examination without abnormal findings: Secondary | ICD-10-CM

## 2022-02-04 LAB — PULMONARY FUNCTION TEST
DL/VA % pred: 90 %
DL/VA: 3.82 ml/min/mmHg/L
DLCO cor % pred: 75 %
DLCO cor: 14.71 ml/min/mmHg
DLCO unc % pred: 76 %
DLCO unc: 14.84 ml/min/mmHg
FEF 25-75 Post: 3.28 L/sec
FEF 25-75 Pre: 3.39 L/sec
FEF2575-%Change-Post: -3 %
FEF2575-%Pred-Post: 145 %
FEF2575-%Pred-Pre: 150 %
FEV1-%Change-Post: -2 %
FEV1-%Pred-Post: 81 %
FEV1-%Pred-Pre: 83 %
FEV1-Post: 1.98 L
FEV1-Pre: 2.03 L
FEV1FVC-%Change-Post: 0 %
FEV1FVC-%Pred-Pre: 116 %
FEV6-%Change-Post: -1 %
FEV6-%Pred-Post: 71 %
FEV6-%Pred-Pre: 72 %
FEV6-Post: 2.18 L
FEV6-Pre: 2.22 L
FEV6FVC-%Pred-Post: 103 %
FEV6FVC-%Pred-Pre: 103 %
FVC-%Change-Post: -2 %
FVC-%Pred-Post: 69 %
FVC-%Pred-Pre: 71 %
FVC-Post: 2.18 L
FVC-Pre: 2.24 L
Post FEV1/FVC ratio: 91 %
Post FEV6/FVC ratio: 100 %
Pre FEV1/FVC ratio: 90 %
Pre FEV6/FVC Ratio: 100 %
RV % pred: 71 %
RV: 1.41 L
TLC % pred: 77 %
TLC: 3.81 L

## 2022-02-04 MED ORDER — CLOPIDOGREL BISULFATE 75 MG PO TABS: 75.0000 mg | ORAL_TABLET | Freq: Every day | ORAL | 3 refills | Status: DC

## 2022-02-04 NOTE — Assessment & Plan Note (Signed)
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  No history of connective tissue disease.  No significant occupational or environmental exposures.  CT chest from 2019 without any evidence of ILD; however, onset of cough and progressive dyspnea was around a year and 1/2 to 2 years ago.  She will need high-resolution CT scan to evaluate for possible interstitial lung disease.  We will determine further workup based on these results.  Patient Instructions  Continue Breztri 2 puffs Twice daily. Brush tongue and rinse mouth afterwards Continue Albuterol inhaler 2 puffs every 6 hours as needed for shortness of breath or wheezing. Notify if symptoms persist despite rescue inhaler/neb use. Continue cetirizine 1 tab for allergies Continue flonase nasal spray 2 sprays each nostril daily Continue protonix 40 mg daily Continue tessalon perles 1 capsule Three times a day as needed for cough  High resolution CT chest ordered today - someone will contact you for scheduling  Referral to psychiatry and new primary care provider  Follow up in 4 weeks with Dr. Ander Rivera (1st) or Lindsay Terrill Wauters,NP to discuss CT scan results. If symptoms worsen, please contact office for sooner follow up or seek emergency care.

## 2022-02-04 NOTE — Patient Instructions (Signed)
Full PFT Performed Today  

## 2022-02-04 NOTE — Progress Notes (Signed)
Full PFT Performed Today  

## 2022-02-04 NOTE — Patient Instructions (Addendum)
Continue Breztri 2 puffs Twice daily. Brush tongue and rinse mouth afterwards Continue Albuterol inhaler 2 puffs every 6 hours as needed for shortness of breath or wheezing. Notify if symptoms persist despite rescue inhaler/neb use. Continue cetirizine 1 tab for allergies Continue flonase nasal spray 2 sprays each nostril daily Continue protonix 40 mg daily Continue tessalon perles 1 capsule Three times a day as needed for cough  High resolution CT chest ordered today - someone will contact you for scheduling  Referral to psychiatry and new primary care provider  Follow up in 4 weeks with Lindsay Rivera (1st) or Lindsay Niko Jakel,NP to discuss CT scan results. If symptoms worsen, please contact office for sooner follow up or seek emergency care.

## 2022-02-04 NOTE — Assessment & Plan Note (Signed)
She has a history of asthma.  No significant bronchodilator response in FEV1 but did have some mid flow reversibility.  Cough seems to be unchanged despite ICS therapy and minimally responsive to prednisone.  She is compliant with Breztri.  Does feel like it helped her dyspnea.  Will continue her on this and as needed albuterol.  Asthma action plan in place.
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
## 2022-02-04 NOTE — Assessment & Plan Note (Signed)
Significant psychiatric history, currently undertreated.  Her Prozac was never refilled by her PCP when she lost her psychiatrist per her report.  She is struggling significantly with depression right now.  Denies any SI/HI.  She does agree that she will seek help if she develops any of the symptoms.  I am going to place an urgent referral to psychiatry today.  I have also placed a new referral to primary care provider per her request.  I think she would benefit greatly from routine therapy.  Advised her to look further into this and see if insurance will cover counseling sessions.

## 2022-02-14 ENCOUNTER — Ambulatory Visit: Payer: 59 | Admitting: Family Medicine

## 2022-03-02 ENCOUNTER — Ambulatory Visit: Payer: 59 | Attending: Dermatology

## 2022-03-04 ENCOUNTER — Ambulatory Visit (HOSPITAL_COMMUNITY)
Admission: RE | Admit: 2022-03-04 | Discharge: 2022-03-04 | Disposition: A | Discharge status: Home / Self Care | Payer: 59 | Source: Ambulatory Visit | Attending: Nurse Practitioner | Admitting: Nurse Practitioner

## 2022-03-04 DIAGNOSIS — J984 Other disorders of lung: Secondary | ICD-10-CM | POA: Insufficient documentation

## 2022-03-08 ENCOUNTER — Encounter: Payer: Self-pay | Admitting: Pulmonary Disease

## 2022-03-08 ENCOUNTER — Ambulatory Visit (INDEPENDENT_AMBULATORY_CARE_PROVIDER_SITE_OTHER): Payer: 59 | Admitting: Pulmonary Disease

## 2022-03-08 VITALS — BP 118/78 | HR 62 | Ht 63.0 in | Wt 150.0 lb

## 2022-03-08 DIAGNOSIS — J849 Interstitial pulmonary disease, unspecified: Secondary | ICD-10-CM

## 2022-03-08 DIAGNOSIS — R0602 Shortness of breath: Secondary | ICD-10-CM | POA: Diagnosis not present
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
## 2022-03-08 NOTE — Patient Instructions (Signed)
I will see you back in 6 months  Repeat PFT in 6 months Repeat CT scan of the chest-high-resolution images -Concern for scarring in the lungs  Blood work including ANA, ANCA, ESR, CRP, myositis panel, rheumatoid factor, anticyclic Citrullinated peptide, hypersensitivity pneumonitis  Continue using your cough medicine Continue using Judithann Sauger

## 2022-03-09 NOTE — Addendum Note (Signed)
Addended by: June Leap on: 03/09/2022 04:43 PM   Modules accepted: Orders

## 2022-03-15 ENCOUNTER — Other Ambulatory Visit (HOSPITAL_COMMUNITY): Payer: 59

## 2022-08-22 ENCOUNTER — Other Ambulatory Visit: Payer: Self-pay

## 2022-08-22 ENCOUNTER — Emergency Department (HOSPITAL_COMMUNITY)
Admission: EM | Admit: 2022-08-22 | Discharge: 2022-08-22 | Disposition: A | Discharge status: Home / Self Care | Payer: 59 | Attending: Emergency Medicine | Admitting: Emergency Medicine

## 2022-08-22 ENCOUNTER — Encounter (HOSPITAL_COMMUNITY): Payer: Self-pay | Admitting: Emergency Medicine

## 2022-08-22 ENCOUNTER — Emergency Department (HOSPITAL_COMMUNITY): Payer: 59

## 2022-08-22 DIAGNOSIS — Z7984 Long term (current) use of oral hypoglycemic drugs: Secondary | ICD-10-CM | POA: Insufficient documentation

## 2022-08-22 DIAGNOSIS — D649 Anemia, unspecified: Secondary | ICD-10-CM | POA: Diagnosis not present

## 2022-08-22 DIAGNOSIS — Z7902 Long term (current) use of antithrombotics/antiplatelets: Secondary | ICD-10-CM | POA: Insufficient documentation

## 2022-08-22 DIAGNOSIS — E860 Dehydration: Secondary | ICD-10-CM | POA: Diagnosis not present

## 2022-08-22 DIAGNOSIS — E871 Hypo-osmolality and hyponatremia: Secondary | ICD-10-CM | POA: Insufficient documentation

## 2022-08-22 DIAGNOSIS — E119 Type 2 diabetes mellitus without complications: Secondary | ICD-10-CM | POA: Diagnosis not present

## 2022-08-22 DIAGNOSIS — E039 Hypothyroidism, unspecified: Secondary | ICD-10-CM | POA: Diagnosis not present

## 2022-08-22 DIAGNOSIS — Z79899 Other long term (current) drug therapy: Secondary | ICD-10-CM | POA: Diagnosis not present

## 2022-08-22 DIAGNOSIS — D72829 Elevated white blood cell count, unspecified: Secondary | ICD-10-CM | POA: Insufficient documentation

## 2022-08-22 DIAGNOSIS — R109 Unspecified abdominal pain: Secondary | ICD-10-CM | POA: Diagnosis present

## 2022-08-22 DIAGNOSIS — R1084 Generalized abdominal pain: Secondary | ICD-10-CM | POA: Diagnosis not present

## 2022-08-22 DIAGNOSIS — Z8673 Personal history of transient ischemic attack (TIA), and cerebral infarction without residual deficits: Secondary | ICD-10-CM | POA: Diagnosis not present

## 2022-08-22 DIAGNOSIS — I110 Hypertensive heart disease with heart failure: Secondary | ICD-10-CM | POA: Diagnosis not present

## 2022-08-22 DIAGNOSIS — Z91040 Latex allergy status: Secondary | ICD-10-CM | POA: Insufficient documentation

## 2022-08-22 DIAGNOSIS — E876 Hypokalemia: Secondary | ICD-10-CM | POA: Insufficient documentation

## 2022-08-22 DIAGNOSIS — R197 Diarrhea, unspecified: Secondary | ICD-10-CM | POA: Diagnosis not present

## 2022-08-22 DIAGNOSIS — R112 Nausea with vomiting, unspecified: Secondary | ICD-10-CM | POA: Insufficient documentation

## 2022-08-22 DIAGNOSIS — I509 Heart failure, unspecified: Secondary | ICD-10-CM | POA: Diagnosis not present

## 2022-08-22 DIAGNOSIS — Z79890 Hormone replacement therapy (postmenopausal): Secondary | ICD-10-CM | POA: Diagnosis not present

## 2022-08-22 LAB — COMPREHENSIVE METABOLIC PANEL
ALT: 17 U/L (ref 0–44)
AST: 20 U/L (ref 15–41)
Albumin: 4.1 g/dL (ref 3.5–5.0)
Alkaline Phosphatase: 102 U/L (ref 38–126)
Anion gap: 16 — ABNORMAL HIGH (ref 5–15)
BUN: 26 mg/dL — ABNORMAL HIGH (ref 8–23)
CO2: 23 mmol/L (ref 22–32)
Calcium: 9.4 mg/dL (ref 8.9–10.3)
Chloride: 91 mmol/L — ABNORMAL LOW (ref 98–111)
Creatinine, Ser: 1.18 mg/dL — ABNORMAL HIGH (ref 0.44–1.00)
GFR, Estimated: 52 mL/min — ABNORMAL LOW (ref >60)
Glucose, Bld: 114 mg/dL — ABNORMAL HIGH (ref 70–99)
Potassium: 2.8 mmol/L — ABNORMAL LOW (ref 3.5–5.1)
Sodium: 130 mmol/L — ABNORMAL LOW (ref 135–145)
Total Bilirubin: 1.2 mg/dL (ref 0.3–1.2)
Total Protein: 8.4 g/dL — ABNORMAL HIGH (ref 6.5–8.1)

## 2022-08-22 LAB — URINALYSIS, ROUTINE W REFLEX MICROSCOPIC
Bilirubin Urine: NEGATIVE
Glucose, UA: NEGATIVE mg/dL
Hgb urine dipstick: NEGATIVE
Ketones, ur: NEGATIVE mg/dL
Leukocytes,Ua: NEGATIVE
Nitrite: NEGATIVE
Protein, ur: NEGATIVE mg/dL
Specific Gravity, Urine: 1.014 (ref 1.005–1.030)
pH: 6 (ref 5.0–8.0)

## 2022-08-22 LAB — CBC
HCT: 46 % (ref 36.0–46.0)
Hemoglobin: 15.7 g/dL — ABNORMAL HIGH (ref 12.0–15.0)
MCH: 28 pg (ref 26.0–34.0)
MCHC: 34.1 g/dL (ref 30.0–36.0)
MCV: 82.1 fL (ref 80.0–100.0)
Platelets: 528 10*3/uL — ABNORMAL HIGH (ref 150–400)
RBC: 5.6 MIL/uL — ABNORMAL HIGH (ref 3.87–5.11)
RDW: 15.3 % (ref 11.5–15.5)
WBC: 18.1 10*3/uL — ABNORMAL HIGH (ref 4.0–10.5)
nRBC: 0 % (ref 0.0–0.2)

## 2022-08-22 LAB — MAGNESIUM: Magnesium: 2.1 mg/dL (ref 1.7–2.4)

## 2022-08-22 LAB — PROTIME-INR
INR: 1 (ref 0.8–1.2)
Prothrombin Time: 13.6 seconds (ref 11.4–15.2)

## 2022-08-22 LAB — LIPASE, BLOOD: Lipase: 29 U/L (ref 11–51)

## 2022-08-22 MED ORDER — FAMOTIDINE 20 MG PO TABS: 20.0000 mg | ORAL_TABLET | Freq: Two times a day (BID) | ORAL | 0 refills | Status: AC

## 2022-08-22 MED ORDER — DROPERIDOL 2.5 MG/ML IJ SOLN
1.2500 mg | Freq: Once | INTRAMUSCULAR | Status: AC
  Administered 2022-08-22: 1.25 mg via INTRAVENOUS
  Filled 2022-08-22: qty 2

## 2022-08-22 MED ORDER — POTASSIUM CHLORIDE ER 10 MEQ PO TBCR: 10.0000 meq | EXTENDED_RELEASE_TABLET | Freq: Two times a day (BID) | ORAL | 0 refills | Status: DC

## 2022-08-22 MED ORDER — POTASSIUM CHLORIDE 10 MEQ/100ML IV SOLN
10.0000 meq | INTRAVENOUS | Status: AC
  Administered 2022-08-22 (×3): 10 meq via INTRAVENOUS
  Filled 2022-08-22 (×3): qty 100

## 2022-08-22 MED ORDER — FAMOTIDINE 20 MG PO TABS: 20.0000 mg | ORAL_TABLET | Freq: Two times a day (BID) | ORAL | 0 refills | Status: DC

## 2022-08-22 MED ORDER — DICYCLOMINE HCL 10 MG PO CAPS
10.0000 mg | ORAL_CAPSULE | Freq: Once | ORAL | Status: AC
  Administered 2022-08-22: 10 mg via ORAL
  Filled 2022-08-22: qty 1

## 2022-08-22 MED ORDER — LACTATED RINGERS IV BOLUS
1000.0000 mL | Freq: Once | INTRAVENOUS | Status: AC
  Administered 2022-08-22: 1000 mL via INTRAVENOUS

## 2022-08-22 MED ORDER — POTASSIUM CHLORIDE CRYS ER 20 MEQ PO TBCR
40.0000 meq | EXTENDED_RELEASE_TABLET | Freq: Once | ORAL | Status: AC
  Administered 2022-08-22: 40 meq via ORAL
  Filled 2022-08-22: qty 2

## 2022-08-22 MED ORDER — IOHEXOL 350 MG/ML SOLN
80.0000 mL | Freq: Once | INTRAVENOUS | Status: AC | PRN
  Administered 2022-08-22: 80 mL via INTRAVENOUS

## 2022-08-22 MED ORDER — DIPHENHYDRAMINE HCL 50 MG/ML IJ SOLN
12.5000 mg | Freq: Once | INTRAMUSCULAR | Status: AC
  Administered 2022-08-22: 12.5 mg via INTRAVENOUS
  Filled 2022-08-22: qty 1

## 2022-08-22 MED ORDER — IOHEXOL 300 MG/ML  SOLN: 100.0000 mL | Freq: Once | INTRAMUSCULAR | Status: DC | PRN

## 2022-08-22 MED ORDER — MORPHINE SULFATE (PF) 4 MG/ML IV SOLN
4.0000 mg | Freq: Once | INTRAVENOUS | Status: AC
  Administered 2022-08-22: 4 mg via INTRAVENOUS
  Filled 2022-08-22: qty 1

## 2022-08-22 MED ORDER — ONDANSETRON HCL 4 MG PO TABS: 4.0000 mg | ORAL_TABLET | Freq: Four times a day (QID) | ORAL | 0 refills | Status: DC

## 2022-08-22 MED ORDER — FAMOTIDINE IN NACL 20-0.9 MG/50ML-% IV SOLN
20.0000 mg | Freq: Once | INTRAVENOUS | Status: AC
  Administered 2022-08-22: 20 mg via INTRAVENOUS
  Filled 2022-08-22: qty 50

## 2022-08-22 MED ORDER — DICYCLOMINE HCL 20 MG PO TABS: 20.0000 mg | ORAL_TABLET | Freq: Two times a day (BID) | ORAL | 0 refills | Status: DC

## 2022-08-22 MED ORDER — METOCLOPRAMIDE HCL 5 MG/ML IJ SOLN
10.0000 mg | Freq: Once | INTRAMUSCULAR | Status: AC
  Administered 2022-08-22: 10 mg via INTRAVENOUS
  Filled 2022-08-22: qty 2

## 2022-08-22 NOTE — ED Notes (Signed)
Patient verbalizes understanding of discharge instructions. Opportunity for questioning and answers were provided. Pt discharged from ED to home with son
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
## 2022-08-22 NOTE — Discharge Instructions (Addendum)
You are seen today for abdominal pain vomiting diarrhea.  You were dehydrated and your potassium was low.  The remainder of your workup was reassuring you had a normal CAT scan today.  We are giving a prescription for nausea , acid reducer and medicine for stomach cramping.  You will also be given potassium pills to take.  Make sure you are drinking plenty of fluids.  Follow-up with your primary care doctor and GI doctor.  Come back to the ER for any worsening symptoms.

## 2022-08-22 NOTE — ED Provider Notes (Signed)
[place] EMERGENCY DEPARTMENT AT [facility] Provider Note   CSN: [id number] Arrival date & time: 08/22/22  1252     History  Chief Complaint  Patient presents with   Emesis   Diarrhea    Lindsay Rivera is a 62 y.o. female.  She has PMH of bipolar disorder, hypertension, GERD, CHF, anemia, diabetes, hypothyroidism, meningioma resection and CVA causing left-sided deficits and chronic pain that occurred during the meningioma resection.  Patient is on chronic Suboxone for her pain in her left arm.  Presents today for abdominal pain nausea vomiting and diarrhea.  She reports she feels this is due to withdrawal from the Suboxone.  She states she went on a cruise, and did not takes her Suboxone with her, states she was feeling poorly after the first couple of days, have any abdominal pain nausea vomiting and diarrhea, she states she talked to her doctor on the cruise gave her some medication for stomach cramps.  She states since coming home she has not gotten any better.  Reports generalized pain, denies shortness of breath, no fevers or chills, no chest pain, no other complaints.    Emesis Associated symptoms: diarrhea   Diarrhea Associated symptoms: vomiting        Home Medications Prior to Admission medications   Medication Sig Start Date End Date Taking? Authorizing Provider  dicyclomine (BENTYL) 20 MG tablet Take 1 tablet (20 mg total) by mouth 2 (two) times daily. 08/22/22  Yes Steffan Caniglia A, PA-C  ondansetron (ZOFRAN) 4 MG tablet Take 1 tablet (4 mg total) by mouth every 6 (six) hours. 08/22/22  Yes Caylyn Tedeschi A, PA-C  potassium chloride (KLOR-CON) 10 MEQ tablet Take 1 tablet (10 mEq total) by mouth 2 (two) times daily. 08/22/22  Yes Mando Blatz A, PA-C  acetaminophen (TYLENOL) 325 MG tablet Take 1-2 tablets (325-650 mg total) by mouth every 4 (four) hours as needed for mild pain. 09/24/20   Angiulli, Mcarthur Rossetti, PA-C  ALPRAZolam Prudy Feeler) 0.5 MG tablet Take  1 tablet (0.5 mg total) by mouth 2 (two) times daily as needed for anxiety. 09/24/20   Angiulli, Mcarthur Rossetti, PA-C  amLODipine (NORVASC) 10 MG tablet Take 1 tablet (10 mg total) by mouth daily. 08/17/21   Nahser, Deloris Ping, MD  benzonatate (TESSALON) 200 MG capsule Take 200 mg by mouth 3 (three) times daily as needed. 02/24/22   [provider]  Budeson-Glycopyrrol-Formoterol (BREZTRI AEROSPHERE) 160-9-4.8 MCG/ACT AERO Inhale 2 puffs into the lungs in the morning and at bedtime. 07/02/21   Tomma Lightning, MD  Calcium Carbonate-Vitamin D 600-400 MG-UNIT chew tablet Chew 1 tablet by mouth daily. 09/24/20   Angiulli, Mcarthur Rossetti, PA-C  carvedilol (COREG) 3.125 MG tablet Take 1 tablet (3.125 mg total) by mouth daily. 08/17/21   Nahser, Deloris Ping, MD  cetirizine (ZYRTEC) 10 MG tablet Take 10 mg by mouth at bedtime.    [provider]  clopidogrel (PLAVIX) 75 MG tablet Take 1 tablet (75 mg total) by mouth daily. 02/04/22   Nahser, Deloris Ping, MD  DULoxetine (CYMBALTA) 20 MG capsule Take 1 capsule (20 mg total) by mouth daily. 10/02/20   Raulkar, Drema Pry, MD  ergocalciferol (VITAMIN D2) 1.25 MG (50000 UT) capsule Take 1 capsule (50,000 Units total) by mouth once a week. 09/24/20   Angiulli, Mcarthur Rossetti, PA-C  ezetimibe (ZETIA) 10 MG tablet Take 1 tablet (10 mg total) by mouth daily. 11/29/21   Nahser, Deloris Ping, MD  famotidine (PEPCID) 20  MG tablet Take 1 tablet (20 mg total) by mouth 2 (two) times daily. 08/22/22   Carmel Sacramento A, PA-C  FLUoxetine (PROZAC) 20 MG capsule Take 60 mg by mouth every morning. 04/30/21   [provider]  FLUoxetine (PROZAC) 40 MG capsule Take 2 capsules (80 mg total) by mouth every morning. 09/24/20   Angiulli, Mcarthur Rossetti, PA-C  fluticasone (FLONASE) 50 MCG/ACT nasal spray Place 2 sprays into both nostrils daily. As needed for allergy symptoms 12/02/19   Fulp, Cammie, MD  gabapentin (NEURONTIN) 300 MG capsule Take 2 capsules (600 mg total) by mouth 3 (three) times daily.  09/24/20   Angiulli, Mcarthur Rossetti, PA-C  HYDROcodone-acetaminophen (NORCO/VICODIN) 5-325 MG tablet Take 1 tablet by mouth every 4 (four) hours as needed. 01/22/22   Jacalyn Lefevre, MD  hydrOXYzine (ATARAX) 50 MG tablet Take 50 mg by mouth at bedtime. 04/30/21   [provider]  ibuprofen (ADVIL) 600 MG tablet Take 1 tablet (600 mg total) by mouth every 6 (six) hours as needed. 01/22/22   Jacalyn Lefevre, MD  levothyroxine (SYNTHROID) 100 MCG tablet Take 100 mcg by mouth daily. 02/23/21   [provider]  levothyroxine (SYNTHROID) 112 MCG tablet TAKE 1 TABLET BY MOUTH ONCE A DAY Patient not taking: Reported on 08/17/2021 09/24/20 09/24/21  Angiulli, Mcarthur Rossetti, PA-C  levothyroxine (SYNTHROID) 88 MCG tablet Take 88 mcg by mouth daily. 08/11/21   [provider]  linaclotide Karlene Einstein) 290 MCG CAPS capsule Take 1 capsule (290 mcg total) by mouth daily before breakfast. 09/24/20   Angiulli, Mcarthur Rossetti, PA-C  metFORMIN (GLUCOPHAGE-XR) 500 MG 24 hr tablet Take 1 tablet (500 mg total) by mouth daily with breakfast. 09/24/20   Angiulli, Mcarthur Rossetti, PA-C  nitroGLYCERIN (NITROSTAT) 0.4 MG SL tablet Place 1 tablet (0.4 mg total) under the tongue every 5 (five) minutes as needed for chest pain (Max of 3 doses, then 911). 08/17/21   Nahser, Deloris Ping, MD  oxyCODONE-acetaminophen (PERCOCET) 10-325 MG tablet Take 1 tablet by mouth 2 (two) times daily as needed. 05/05/21   [provider]  pantoprazole (PROTONIX) 40 MG tablet Take 1 tablet (40 mg total) by mouth daily. 08/17/21   Nahser, Deloris Ping, MD  polyethylene glycol (MIRALAX / GLYCOLAX) 17 g packet Take 17 g by mouth daily. 09/24/20   Angiulli, Mcarthur Rossetti, PA-C  QUEtiapine (SEROQUEL) 100 MG tablet Take 1 tablet (100 mg total) by mouth at bedtime. 09/24/20   Angiulli, Mcarthur Rossetti, PA-C  TRELEGY ELLIPTA 100-62.5-25 MCG/ACT AEPB Inhale 1 puff into the lungs daily. 02/22/21   [provider]  triamterene-hydrochlorothiazide (MAXZIDE-25) 37.5-25 MG  tablet Take 1 tablet by mouth daily. 08/17/21   Nahser, Deloris Ping, MD  valACYclovir (VALTREX) 500 MG tablet Take 1 tablet (500 mg total) by mouth daily. 09/24/20   Angiulli, Mcarthur Rossetti, PA-C      Allergies    Penicillins, Other, Gadolinium derivatives, Iodine, and Latex    Review of Systems   Review of Systems  Gastrointestinal:  Positive for diarrhea and vomiting.    Physical Exam Updated Vital Signs BP (!) 190/112   Pulse 67   Temp 98.6 F (37 C)   Resp 16   Ht 5\' 3"  (1.6 m)   Wt 65.8 kg   SpO2 97%   BMI 25.69 kg/m  Physical Exam Vitals and nursing note reviewed.  Constitutional:      General: She is not in acute distress.    Appearance: She is well-developed.  HENT:  Head: Normocephalic and atraumatic.     Mouth/Throat:     Mouth: Mucous membranes are moist.  Eyes:     Extraocular Movements: Extraocular movements intact.     Conjunctiva/sclera: Conjunctivae normal.     Pupils: Pupils are equal, round, and reactive to light.  Cardiovascular:     Rate and Rhythm: Normal rate and regular rhythm.     Heart sounds: No murmur heard. Pulmonary:     Effort: Pulmonary effort is normal. No respiratory distress.     Breath sounds: Normal breath sounds.  Abdominal:     Palpations: Abdomen is soft.     Tenderness: There is abdominal tenderness.  Musculoskeletal:        General: No swelling. Normal range of motion.     Cervical back: Neck supple.  Skin:    General: Skin is warm and dry.     Capillary Refill: Capillary refill takes less than 2 seconds.  Neurological:     General: No focal deficit present.     Mental Status: She is alert and oriented to person, place, and time.  Psychiatric:        Mood and Affect: Mood normal.     ED Results / Procedures / Treatments   Labs (all labs ordered are listed, but only abnormal results are displayed) Labs Reviewed  COMPREHENSIVE METABOLIC PANEL - Abnormal; Notable for the following components:      Result Value   Sodium  130 (*)    Potassium 2.8 (*)    Chloride 91 (*)    Glucose, Bld 114 (*)    BUN 26 (*)    Creatinine, Ser 1.18 (*)    Total Protein 8.4 (*)    GFR, Estimated 52 (*)    Anion gap 16 (*)    All other components within normal limits  CBC - Abnormal; Notable for the following components:   WBC 18.1 (*)    RBC 5.60 (*)    Hemoglobin 15.7 (*)    Platelets 528 (*)    All other components within normal limits  LIPASE, BLOOD  URINALYSIS, ROUTINE W REFLEX MICROSCOPIC  PROTIME-INR  MAGNESIUM    EKG EKG Interpretation Date/Time:  Monday August 22 2022 16:00:42 EDT Ventricular Rate:  75 PR Interval:  156 QRS Duration:  91 QT Interval:  490 QTC Calculation: 548 R Axis:   48  Text Interpretation: Sinus rhythm Biatrial enlargement Abnormal T, consider ischemia, diffuse leads Prolonged QT interval Confirmed by Meridee Score [phone]) on 08/22/2022 6:52:43 PM  Radiology CT ABDOMEN PELVIS W CONTRAST  Result Date: 08/22/2022 CLINICAL DATA:  Abdominal pain, nausea and vomiting EXAM: CT ABDOMEN AND PELVIS WITH CONTRAST TECHNIQUE: Multidetector CT imaging of the abdomen and pelvis was performed using the standard protocol following bolus administration of intravenous contrast. RADIATION DOSE REDUCTION: This exam was performed according to the departmental dose-optimization program which includes automated exposure control, adjustment of the mA and/or kV according to patient size and/or use of iterative reconstruction technique. CONTRAST:  80mL OMNIPAQUE IOHEXOL 350 MG/ML SOLN COMPARISON:  01/10/2018 FINDINGS: Lower chest: No acute abnormality. Hepatobiliary: No solid liver abnormality is seen. No gallstones, gallbladder wall thickening, or biliary dilatation. Pancreas: Unremarkable. No pancreatic ductal dilatation or surrounding inflammatory changes. Spleen: Normal in size without significant abnormality. Adrenals/Urinary Tract: Adrenal glands are unremarkable. Kidneys are normal, without renal calculi,  solid lesion, or hydronephrosis. Bladder is unremarkable. Stomach/Bowel: Stomach is within normal limits. Status post appendectomy. No evidence of bowel wall thickening, distention, or inflammatory changes.  Vascular/Lymphatic: Aortic atherosclerosis. No enlarged abdominal or pelvic lymph nodes. Reproductive: Status post hysterectomy Other: No abdominal wall hernia or abnormality. No ascites. Musculoskeletal: No acute or significant osseous findings. IMPRESSION: 1. No acute CT findings of the abdomen or pelvis to explain pain, nausea, or vomiting. 2. Status post appendectomy and hysterectomy. Aortic Atherosclerosis (ICD10-I70.0). Electronically Signed   By: Jearld Lesch M.D.   On: 08/22/2022 15:48    Procedures Procedures    Medications Ordered in ED Medications  lactated ringers bolus 1,000 mL (0 mLs Intravenous Stopped 08/22/22 1735)  droperidol (INAPSINE) 2.5 MG/ML injection 1.25 mg (1.25 mg Intravenous Given 08/22/22 1424)  potassium chloride 10 mEq in 100 mL IVPB (0 mEq Intravenous Stopped 08/22/22 1830)  iohexol (OMNIPAQUE) 350 MG/ML injection 80 mL (80 mLs Intravenous Contrast Given 08/22/22 1515)  morphine (PF) 4 MG/ML injection 4 mg (4 mg Intravenous Given 08/22/22 1601)  metoCLOPramide (REGLAN) injection 10 mg (10 mg Intravenous Given 08/22/22 1705)  diphenhydrAMINE (BENADRYL) injection 12.5 mg (12.5 mg Intravenous Given 08/22/22 1705)  famotidine (PEPCID) IVPB 20 mg premix (0 mg Intravenous Stopped 08/22/22 1735)  potassium chloride SA (KLOR-CON M) CR tablet 40 mEq (40 mEq Oral Given 08/22/22 1830)  dicyclomine (BENTYL) capsule 10 mg (10 mg Oral Given 08/22/22 1858)    ED Course/ Medical Decision Making/ A&P                             Medical Decision Making This patient presents to the ED for concern of abdominal pain nausea vomiting and diarrhea, this involves an extensive number of treatment options, and is a complaint that carries with it a high risk of complications and morbidity.   The differential diagnosis includes gastritis, gastroenteritis, appendicitis, cholecystitis, diverticulitis, DKA, nephrolithiasis, gastroparesis, other    Co morbidities that complicate the patient evaluation  Inerstitial lung disease, bipolar d/o, htn   Additional history obtained:  Additional history obtained from emr External records from outside source obtained and reviewed including notes   Lab Tests:  I Ordered, and personally interpreted labs.  The pertinent results include: CBC shows leukocytosis of 18.1 and hemoglobin of 15.7.  Elevated hemoglobin likely hemoconcentration.  Platelets also slightly elevated at 528, lipase is normal, CMP shows mild hyponatremia, hypokalemia with potassium of 2.8, chloride 91, BUN and creatinine elevated at 26 and 1.18 respectively.  Anion gap is slightly at 16 but CO2 is normal.  Urinalysis is normal.   Imaging Studies ordered:  I ordered imaging studies including CT abdomen pelvis I independently visualized and interpreted imaging which showed no acute intra-abdominal process I agree with the radiologist interpretation     Problem List / ED Course / Critical interventions / Medication management  Abdominal pain-having abdominal pain nausea vomiting and diarrhea for 10 days since going on a cruise, she feels likely related to withdrawal from Suboxone which is possible but seems an extended time For this alone.  Labs show dehydration with mild AKI, hypokalemia, hyponatremia.  Patient given IV fluids, she is drinking ginger ale feeling much better.  She was still having some upper abdominal pain, was given Pepcid and Bentyl.  Her pain is 0 she feels much better than when she came in, she clinically looks much better as well.  She had no chest pain or shortness of breath.  No ischemia on her EKG.  She was not having chest or back pain.  Discussed with patient we will treat her symptoms and have  her follow-up with GI who with whom she is already  established.  She states she recently took medication for H. pylori infection.  She was unable to produce stool here to get sample.  Have low suspicion for C. difficile given negative CT for colitis though discussed with patient is a still possibility and she should have stool studies if diarrhea continues. I ordered medication including morphine, Bentyl, Pepcid, droperidol, Reglan for abdominal pain, nausea and vomiting Reevaluation of the patient after these medicines showed that the patient improved I have reviewed the patients home medicines and have made adjustments as needed    Test / Admission - Considered:  CTA of abdomen pelvis to rule out mesenteric ischemia.  Patient's not having typical postprandial symptoms, pain is improved, she sitting up, smiling, abdomen is soft with only mild tenderness, she does not have A-fib.  I feel this is very unlikely.  Patient counseled on strict return precautions    Amount and/or Complexity of Data Reviewed Labs: ordered. Radiology: ordered.  Risk Prescription drug management.           Final Clinical Impression(s) / ED Diagnoses Final diagnoses:  Dehydration  Generalized abdominal pain  Nausea vomiting and diarrhea    Rx / DC Orders ED Discharge Orders          Ordered    dicyclomine (BENTYL) 20 MG tablet  2 times daily        08/22/22 1941    famotidine (PEPCID) 20 MG tablet  2 times daily,   Status:  Discontinued        08/22/22 1941    famotidine (PEPCID) 20 MG tablet  2 times daily        08/22/22 1942    ondansetron (ZOFRAN) 4 MG tablet  Every 6 hours        08/22/22 1942    potassium chloride (KLOR-CON) 10 MEQ tablet  2 times daily        08/22/22 1942              Ma Rings, PA-C 08/22/22 1946    Franne Forts, DO 08/29/22 2330

## 2022-09-07 ENCOUNTER — Other Ambulatory Visit: Payer: 59

## 2022-11-22 ENCOUNTER — Other Ambulatory Visit: Payer: 59

## 2022-11-23 ENCOUNTER — Ambulatory Visit
Admission: RE | Admit: 2022-11-23 | Discharge: 2022-11-23 | Disposition: A | Discharge status: Home / Self Care | Payer: 59 | Source: Ambulatory Visit | Attending: Pulmonary Disease | Admitting: Pulmonary Disease

## 2022-11-23 DIAGNOSIS — J849 Interstitial pulmonary disease, unspecified: Secondary | ICD-10-CM

## 2023-04-03 ENCOUNTER — Other Ambulatory Visit: Payer: Self-pay | Admitting: Cardiovascular Disease

## 2023-04-03 DIAGNOSIS — I25700 Atherosclerosis of coronary artery bypass graft(s), unspecified, with unstable angina pectoris: Secondary | ICD-10-CM

## 2023-05-22 ENCOUNTER — Encounter: Payer: Self-pay | Admitting: Cardiovascular Disease
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
## 2023-05-23 ENCOUNTER — Observation Stay (HOSPITAL_COMMUNITY)
Admission: EM | Admit: 2023-05-23 | Discharge: 2023-05-26 | Disposition: A | Discharge status: Home / Self Care | Attending: Cardiology | Admitting: Cardiology

## 2023-05-23 ENCOUNTER — Encounter (HOSPITAL_COMMUNITY): Payer: Self-pay | Admitting: Emergency Medicine

## 2023-05-23 ENCOUNTER — Encounter: Payer: Self-pay | Admitting: Cardiovascular Disease

## 2023-05-23 ENCOUNTER — Ambulatory Visit: Attending: Cardiovascular Disease | Admitting: Cardiovascular Disease

## 2023-05-23 ENCOUNTER — Emergency Department (HOSPITAL_COMMUNITY)

## 2023-05-23 ENCOUNTER — Other Ambulatory Visit: Payer: Self-pay

## 2023-05-23 VITALS — BP 104/70 | HR 59 | Temp 98.2°F | Ht 62.0 in | Wt 153.0 lb

## 2023-05-23 DIAGNOSIS — Z87891 Personal history of nicotine dependence: Secondary | ICD-10-CM | POA: Diagnosis not present

## 2023-05-23 DIAGNOSIS — R079 Chest pain, unspecified: Principal | ICD-10-CM

## 2023-05-23 DIAGNOSIS — E785 Hyperlipidemia, unspecified: Secondary | ICD-10-CM | POA: Diagnosis not present

## 2023-05-23 DIAGNOSIS — I25700 Atherosclerosis of coronary artery bypass graft(s), unspecified, with unstable angina pectoris: Secondary | ICD-10-CM

## 2023-05-23 DIAGNOSIS — K219 Gastro-esophageal reflux disease without esophagitis: Secondary | ICD-10-CM

## 2023-05-23 DIAGNOSIS — Z7901 Long term (current) use of anticoagulants: Secondary | ICD-10-CM | POA: Diagnosis not present

## 2023-05-23 DIAGNOSIS — I110 Hypertensive heart disease with heart failure: Secondary | ICD-10-CM | POA: Insufficient documentation

## 2023-05-23 DIAGNOSIS — J449 Chronic obstructive pulmonary disease, unspecified: Secondary | ICD-10-CM | POA: Insufficient documentation

## 2023-05-23 DIAGNOSIS — Z79899 Other long term (current) drug therapy: Secondary | ICD-10-CM | POA: Diagnosis not present

## 2023-05-23 DIAGNOSIS — I10 Essential (primary) hypertension: Secondary | ICD-10-CM | POA: Diagnosis present

## 2023-05-23 DIAGNOSIS — E039 Hypothyroidism, unspecified: Secondary | ICD-10-CM | POA: Diagnosis not present

## 2023-05-23 DIAGNOSIS — Z8679 Personal history of other diseases of the circulatory system: Secondary | ICD-10-CM | POA: Diagnosis not present

## 2023-05-23 DIAGNOSIS — I509 Heart failure, unspecified: Secondary | ICD-10-CM | POA: Diagnosis not present

## 2023-05-23 DIAGNOSIS — R0789 Other chest pain: Secondary | ICD-10-CM | POA: Diagnosis present

## 2023-05-23 DIAGNOSIS — F32A Depression, unspecified: Secondary | ICD-10-CM | POA: Insufficient documentation

## 2023-05-23 DIAGNOSIS — F419 Anxiety disorder, unspecified: Secondary | ICD-10-CM | POA: Diagnosis not present

## 2023-05-23 DIAGNOSIS — F1290 Cannabis use, unspecified, uncomplicated: Secondary | ICD-10-CM | POA: Diagnosis not present

## 2023-05-23 DIAGNOSIS — I200 Unstable angina: Secondary | ICD-10-CM | POA: Diagnosis present

## 2023-05-23 DIAGNOSIS — Z951 Presence of aortocoronary bypass graft: Secondary | ICD-10-CM | POA: Diagnosis not present

## 2023-05-23 DIAGNOSIS — Z91040 Latex allergy status: Secondary | ICD-10-CM | POA: Diagnosis not present

## 2023-05-23 DIAGNOSIS — I25110 Atherosclerotic heart disease of native coronary artery with unstable angina pectoris: Principal | ICD-10-CM | POA: Insufficient documentation

## 2023-05-23 LAB — TROPONIN I (HIGH SENSITIVITY)
Troponin I (High Sensitivity): 3 ng/L (ref <18)
Troponin I (High Sensitivity): 3 ng/L (ref <18)

## 2023-05-23 LAB — CBC
HCT: 44 % (ref 36.0–46.0)
Hemoglobin: 13.8 g/dL (ref 12.0–15.0)
MCH: 28.4 pg (ref 26.0–34.0)
MCHC: 31.4 g/dL (ref 30.0–36.0)
MCV: 90.5 fL (ref 80.0–100.0)
Platelets: 383 10*3/uL (ref 150–400)
RBC: 4.86 MIL/uL (ref 3.87–5.11)
RDW: 15.9 % — ABNORMAL HIGH (ref 11.5–15.5)
WBC: 8.8 10*3/uL (ref 4.0–10.5)
nRBC: 0 % (ref 0.0–0.2)

## 2023-05-23 LAB — T4, FREE: Free T4: 0.89 ng/dL (ref 0.61–1.12)

## 2023-05-23 LAB — BASIC METABOLIC PANEL WITH GFR
Anion gap: 12 (ref 5–15)
BUN: 8 mg/dL (ref 8–23)
CO2: 22 mmol/L (ref 22–32)
Calcium: 9.3 mg/dL (ref 8.9–10.3)
Chloride: 104 mmol/L (ref 98–111)
Creatinine, Ser: 0.75 mg/dL (ref 0.44–1.00)
GFR, Estimated: >60 mL/min (ref >60)
Glucose, Bld: 101 mg/dL — ABNORMAL HIGH (ref 70–99)
Potassium: 5 mmol/L (ref 3.5–5.1)
Sodium: 138 mmol/L (ref 135–145)

## 2023-05-23 LAB — HEMOGLOBIN A1C
Hgb A1c MFr Bld: 6 % — ABNORMAL HIGH (ref 4.8–5.6)
Mean Plasma Glucose: 125.5 mg/dL

## 2023-05-23 LAB — HIV ANTIBODY (ROUTINE TESTING W REFLEX): HIV Screen 4th Generation wRfx: NONREACTIVE

## 2023-05-23 LAB — TSH: TSH: 3.157 u[IU]/mL (ref 0.350–4.500)

## 2023-05-23 MED ORDER — AMLODIPINE BESYLATE 10 MG PO TABS
10.0000 mg | ORAL_TABLET | Freq: Every day | ORAL | Status: DC
  Administered 2023-05-25 – 2023-05-26 (×2): 10 mg via ORAL
  Filled 2023-05-23 (×2): qty 1

## 2023-05-23 MED ORDER — FLUOXETINE HCL 20 MG PO CAPS
60.0000 mg | ORAL_CAPSULE | Freq: Every morning | ORAL | Status: DC
Start: 2023-05-24 — End: 2023-05-26
  Administered 2023-05-24 – 2023-05-26 (×3): 60 mg via ORAL
  Filled 2023-05-23 (×3): qty 3

## 2023-05-23 MED ORDER — ACETAMINOPHEN 500 MG PO TABS
1000.0000 mg | ORAL_TABLET | Freq: Four times a day (QID) | ORAL | Status: DC | PRN
Start: 2023-05-23 — End: 2023-05-26
  Administered 2023-05-23 – 2023-05-26 (×8): 1000 mg via ORAL
  Filled 2023-05-23 (×8): qty 2

## 2023-05-23 MED ORDER — DICLOFENAC SODIUM 1 % EX GEL
4.0000 g | Freq: Four times a day (QID) | CUTANEOUS | Status: DC | PRN
  Administered 2023-05-24: 4 g via TOPICAL
  Filled 2023-05-23 (×2): qty 100

## 2023-05-23 MED ORDER — NITROGLYCERIN 0.4 MG SL SUBL
0.4000 mg | SUBLINGUAL_TABLET | SUBLINGUAL | Status: DC | PRN
  Administered 2023-05-23 – 2023-05-24 (×5): 0.4 mg via SUBLINGUAL
  Filled 2023-05-23 (×5): qty 1

## 2023-05-23 MED ORDER — BUDESON-GLYCOPYRROL-FORMOTEROL 160-9-4.8 MCG/ACT IN AERO
2.0000 | INHALATION_SPRAY | Freq: Every day | RESPIRATORY_TRACT | Status: DC
  Administered 2023-05-25 – 2023-05-26 (×2): 2 via RESPIRATORY_TRACT
  Filled 2023-05-23: qty 5.9

## 2023-05-23 MED ORDER — CARVEDILOL 3.125 MG PO TABS
3.1250 mg | ORAL_TABLET | Freq: Every day | ORAL | Status: DC
  Administered 2023-05-23 – 2023-05-26 (×4): 3.125 mg via ORAL
  Filled 2023-05-23 (×4): qty 1

## 2023-05-23 MED ORDER — ALPRAZOLAM 0.5 MG PO TABS
0.5000 mg | ORAL_TABLET | Freq: Two times a day (BID) | ORAL | Status: DC | PRN
  Administered 2023-05-23 – 2023-05-26 (×6): 0.5 mg via ORAL
  Filled 2023-05-23: qty 2
  Filled 2023-05-23 (×5): qty 1

## 2023-05-23 NOTE — ED Triage Notes (Signed)
 Brought in via EMS for Chest pain, started Sunday, 324mg  asa given Was picked up at doctors office  154 78 60hr 100% RA CBG 124

## 2023-05-23 NOTE — ED Notes (Signed)
 Food tray ordered

## 2023-05-23 NOTE — ED Provider Notes (Signed)
 [place] [facility] AT [facility] Provider Note   CSN: [id number] Arrival date & time: 05/23/23  1021     History  Chief Complaint  Patient presents with   Chest Pain    Lindsay Rivera is a 63 y.o. female.  Patient with past medical history significant for CAD with triple bypass in January 2015, stenting of left main in March 2017, obesity, bipolar disorder, type II DM, chronic back pain presents the emergency room complaining of left-sided chest pain rated 8 out of 10 in severity with associated shortness of breath and radiation of pain down the left arm with some paresthesias in the left fingers.  She was seen by cardiology this morning who sent her via EMS to the emergency department for further evaluation.  Patient states that this pain began on Sunday.  She did attempt taking nitroglycerin at home but the nitroglycerin was expired.  She ran out of Plavix months ago.  Pain is intermittent in nature.  Pain reportedly last for 30 seconds at a time.  At the time of my assessment she does endorse mild shortness of breath at rest and continued 8 out of 10 pain.  She denies nausea, vomiting, abdominal pain.  Patient does state pain occasionally radiates towards her shoulder blades.   Chest Pain      Home Medications Prior to Admission medications   Medication Sig Start Date End Date Taking? Authorizing Provider  ALPRAZolam (XANAX) 0.5 MG tablet Take 1 tablet (0.5 mg total) by mouth 2 (two) times daily as needed for anxiety. 09/24/20  Yes Angiulli, Everlyn Hockey, PA-C  amLODipine (NORVASC) 10 MG tablet Take 1 tablet (10 mg total) by mouth daily. 08/17/21  Yes Nahser, Lela Purple, MD  benzonatate (TESSALON) 200 MG capsule Take 200 mg by mouth 3 (three) times daily as needed for cough. 02/24/22  Yes [provider]  Budeson-Glycopyrrol-Formoterol (BREZTRI AEROSPHERE) 160-9-4.8 MCG/ACT AERO Inhale 2 puffs into the lungs in the morning and at bedtime. 07/02/21  Yes Olalere,  Adewale A, MD  carvedilol (COREG) 3.125 MG tablet Take 1 tablet (3.125 mg total) by mouth daily. 08/17/21  Yes Nahser, Lela Purple, MD  cetirizine (ZYRTEC) 10 MG tablet Take 10 mg by mouth at bedtime.   Yes [provider]  clopidogrel (PLAVIX) 75 MG tablet TAKE ONE TABLET BY MOUTH ONCE DAILY 04/03/23  Yes Nahser, Lela Purple, MD  ergocalciferol (VITAMIN D2) 1.25 MG (50000 UT) capsule Take 1 capsule (50,000 Units total) by mouth once a week. 09/24/20  Yes Angiulli, Everlyn Hockey, PA-C  famotidine (PEPCID) 20 MG tablet Take 1 tablet (20 mg total) by mouth 2 (two) times daily. 08/22/22  Yes Beatty, Celeste A, PA-C  FLUoxetine (PROZAC) 20 MG capsule Take 60 mg by mouth every morning. 04/30/21  Yes [provider]  levothyroxine (SYNTHROID) 100 MCG tablet Take 100 mcg by mouth daily. 02/23/21  Yes [provider]  linaclotide Glory Larsen) 290 MCG CAPS capsule Take 1 capsule (290 mcg total) by mouth daily before breakfast. 09/24/20  Yes Angiulli, Everlyn Hockey, PA-C  nitroGLYCERIN (NITROSTAT) 0.4 MG SL tablet Place 1 tablet (0.4 mg total) under the tongue every 5 (five) minutes as needed for chest pain (Max of 3 doses, then 911). 08/17/21  Yes Nahser, Lela Purple, MD  oxyCODONE-acetaminophen (PERCOCET) 10-325 MG tablet Take 1 tablet by mouth every 6 (six) hours as needed for pain. 05/05/21  Yes [provider]  pantoprazole (PROTONIX) 20 MG tablet Take 20 mg by mouth  daily.   Yes [provider]  TRELEGY ELLIPTA 100-62.5-25 MCG/ACT AEPB Inhale 1 puff into the lungs daily. 02/22/21  Yes [provider]  valACYclovir (VALTREX) 500 MG tablet Take 1 tablet (500 mg total) by mouth daily. 09/24/20  Yes Angiulli, Mcarthur Rossetti, PA-C  Calcium Carbonate-Vitamin D 600-400 MG-UNIT chew tablet Chew 1 tablet by mouth daily. Patient not taking: Reported on 05/23/2023 09/24/20   Angiulli, Mcarthur Rossetti, PA-C  dicyclomine (BENTYL) 20 MG tablet Take 1 tablet (20 mg total) by mouth 2 (two) times daily. Patient  not taking: Reported on 05/23/2023 08/22/22   Carmel Sacramento A, PA-C  DULoxetine (CYMBALTA) 20 MG capsule Take 1 capsule (20 mg total) by mouth daily. Patient not taking: Reported on 05/23/2023 10/02/20   Horton Chin, MD  ezetimibe (ZETIA) 10 MG tablet Take 1 tablet (10 mg total) by mouth daily. Patient not taking: Reported on 05/23/2023 11/29/21   Nahser, Deloris Ping, MD  FLUoxetine (PROZAC) 40 MG capsule Take 2 capsules (80 mg total) by mouth every morning. Patient not taking: Reported on 05/23/2023 09/24/20   Angiulli, Mcarthur Rossetti, PA-C  fluticasone [facility]) 50 MCG/ACT nasal spray Place 2 sprays into both nostrils daily. As needed for allergy symptoms Patient not taking: Reported on 05/23/2023 12/02/19   Cain Saupe, MD  levothyroxine (SYNTHROID) 112 MCG tablet TAKE 1 TABLET BY MOUTH ONCE A DAY Patient not taking: Reported on 08/17/2021 09/24/20 09/24/21  Angiulli, Mcarthur Rossetti, PA-C  levothyroxine (SYNTHROID) 88 MCG tablet Take 88 mcg by mouth daily. Patient not taking: Reported on 05/23/2023 08/11/21   [provider]  metFORMIN (GLUCOPHAGE-XR) 500 MG 24 hr tablet Take 1 tablet (500 mg total) by mouth daily with breakfast. Patient not taking: Reported on 05/23/2023 09/24/20   Angiulli, Mcarthur Rossetti, PA-C  ondansetron (ZOFRAN) 4 MG tablet Take 1 tablet (4 mg total) by mouth every 6 (six) hours. Patient not taking: Reported on 05/23/2023 08/22/22   Carmel Sacramento A, PA-C  pantoprazole (PROTONIX) 40 MG tablet Take 1 tablet (40 mg total) by mouth daily. Patient not taking: Reported on 05/23/2023 08/17/21   Nahser, Deloris Ping, MD  triamterene-hydrochlorothiazide (MAXZIDE-25) 37.5-25 MG tablet Take 1 tablet by mouth daily. Patient not taking: Reported on 05/23/2023 08/17/21   Nahser, Deloris Ping, MD      Allergies    Penicillins, Other, Gadolinium derivatives, Iodine, and Latex    Review of Systems   Review of Systems  Cardiovascular:  Positive for chest pain.    Physical Exam Updated Vital Signs BP  125/81   Pulse (!) 57   Temp 98 F (36.7 C) (Oral)   Resp 15   Ht 5\' 2"  (1.575 m)   Wt 69 kg   SpO2 100%   BMI 27.82 kg/m  Physical Exam Vitals and nursing note reviewed.  Constitutional:      General: She is not in acute distress.    Appearance: She is well-developed.  HENT:     Head: Normocephalic and atraumatic.  Eyes:     Conjunctiva/sclera: Conjunctivae normal.  Cardiovascular:     Rate and Rhythm: Normal rate and regular rhythm.  Pulmonary:     Effort: Pulmonary effort is normal. No respiratory distress.     Breath sounds: Normal breath sounds.  Chest:     Chest wall: No tenderness.  Abdominal:     Palpations: Abdomen is soft.     Tenderness: There is no abdominal tenderness.  Musculoskeletal:        General: No swelling.  Cervical back: Neck supple.  Skin:    General: Skin is warm and dry.     Capillary Refill: Capillary refill takes less than 2 seconds.  Neurological:     Mental Status: She is alert.  Psychiatric:        Mood and Affect: Mood normal.     ED Results / Procedures / Treatments   Labs (all labs ordered are listed, but only abnormal results are displayed) Labs Reviewed  BASIC METABOLIC PANEL WITH GFR - Abnormal; Notable for the following components:      Result Value   Glucose, Bld 101 (*)    All other components within normal limits  CBC - Abnormal; Notable for the following components:   RDW 15.9 (*)    All other components within normal limits  TSH  T4, FREE  T3  HEMOGLOBIN A1C  LIPID PANEL  HIV ANTIBODY (ROUTINE TESTING W REFLEX)  BASIC METABOLIC PANEL WITH GFR  CBC  TROPONIN I (HIGH SENSITIVITY)  TROPONIN I (HIGH SENSITIVITY)    EKG EKG Interpretation Date/Time:  Tuesday May 23 2023 10:45:05 EDT Ventricular Rate:  59 PR Interval:  192 QRS Duration:  68 QT Interval:  450 QTC Calculation: 445 R Axis:   22  Text Interpretation: Sinus bradycardia Right atrial enlargement ST & T wave abnormality, consider lateral  ischemia Abnormal ECG When compared with ECG of 23-May-2023 09:17, PREVIOUS ECG IS PRESENT no sig change from first previous Confirmed by Wynetta Heckle [phone]) on 05/23/2023 12:42:41 PM  Radiology DG Chest 2 View Result Date: 05/23/2023 CLINICAL DATA:  Chest pain. EXAM: CHEST - 2 VIEW COMPARISON:  01/22/2022. FINDINGS: Bilateral lung fields are clear. Bilateral costophrenic angles are clear. Normal cardio-mediastinal silhouette. No acute osseous abnormalities. The soft tissues are within normal limits. IMPRESSION: No active cardiopulmonary disease. Electronically Signed   By: Beula Brunswick M.D.   On: 05/23/2023 10:59    Procedures Procedures    Medications Ordered in ED Medications  nitroGLYCERIN (NITROSTAT) SL tablet 0.4 mg (0.4 mg Sublingual Given 05/23/23 1538)  ALPRAZolam (XANAX) tablet 0.5 mg (has no administration in time range)  amLODipine (NORVASC) tablet 10 mg (has no administration in time range)  budeson-glycopyrrolate-formoterol (BREZTRI) 160-9-4.8 MCG/ACT inhaler 2 puff (has no administration in time range)  carvedilol (COREG) tablet 3.125 mg (has no administration in time range)  FLUoxetine (PROZAC) capsule 60 mg (has no administration in time range)    ED Course/ Medical Decision Making/ A&P             HEART Score: 6                    Medical Decision Making Amount and/or Complexity of Data Reviewed Labs: ordered. Radiology: ordered.  Risk Prescription drug management. Decision regarding hospitalization.   This patient presents to the ED for concern of chest pain, this involves an extensive number of treatment options, and is a complaint that carries with it a high risk of complications and morbidity.  The differential diagnosis includes ACS, musculoskeletal pain, pulmonary embolism, pneumonia, others   Co morbidities that complicate the patient evaluation  Previous stenting and CABG, CAD   Additional history obtained:  Additional history obtained from  EMS External records from outside source obtained and reviewed including cardiology notes   Lab Tests:  I Ordered, and personally interpreted labs.  The pertinent results include: Initial and repeat troponin of 3   Imaging Studies ordered:  I ordered imaging studies including chest x-ray I independently visualized  and interpreted imaging which showed no acute findings I agree with the radiologist interpretation   Cardiac Monitoring: / EKG:  The patient was maintained on a cardiac monitor.  I personally viewed and interpreted the cardiac monitored which showed an underlying rhythm of: Sinus bradycardia with a rate of 59   Problem List / ED Course / Critical interventions / Medication management   I ordered medication including nitroglycerin for chest pain Reevaluation of the patient after these medicines showed that the patient improved I have reviewed the patients home medicines and have made adjustments as needed   Consultations Obtained:        I spoke with the cardiology team over the phone who stated that her cardiologist wanted the patient admitted to medicine with plans for cardiology to round for further testing.  Patient has reportedly been out of many medications for a month or more. I requested consultation with the medicine team, internal medicine teaching service,  and discussed lab and imaging findings as well as pertinent plan - they recommend: Admission for observation and further cardiac workup   Social Determinants of Health:  Patient is a former smoker   Test / Admission - Considered:  Patient with no STEMI on EKG, negative troponins x 2, but with elevated heart score of 6.  Cardiology recommends admission to medicine with plans for further workup while in the hospital.  Patient has reportedly been out of her nitroglycerin and Plavix for a few months or more         Final Clinical Impression(s) / ED Diagnoses Final diagnoses:  Chest pain,  unspecified type    Rx / DC Orders ED Discharge Orders     None         Delories Fetter 05/23/23 1735    Jerilynn Montenegro, MD 05/23/23 2316
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
## 2023-05-23 NOTE — Patient Instructions (Signed)
 Medication Instructions:  Your physician recommends that you continue on your current medications as directed. Please refer to the Current Medication list given to you today.  *If you need a refill on your cardiac medications before your next appointment, please call your pharmacy*  Lab Work: None ordered today.  Testing/Procedures: None ordered today.  Follow-Up: You are being sent to the emergency department for evaluation of chest pain. Follow-up will be recommended/arranged after discharge.
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
## 2023-05-24 ENCOUNTER — Encounter (HOSPITAL_COMMUNITY)
Admission: EM | Disposition: A | Discharge status: Home / Self Care | Payer: Self-pay | Source: Home / Self Care | Attending: Emergency Medicine

## 2023-05-24 ENCOUNTER — Other Ambulatory Visit: Payer: Self-pay

## 2023-05-24 ENCOUNTER — Ambulatory Visit (HOSPITAL_COMMUNITY): Admit: 2023-05-24 | Admitting: Cardiology

## 2023-05-24 DIAGNOSIS — Z951 Presence of aortocoronary bypass graft: Secondary | ICD-10-CM

## 2023-05-24 DIAGNOSIS — I25110 Atherosclerotic heart disease of native coronary artery with unstable angina pectoris: Secondary | ICD-10-CM | POA: Diagnosis not present

## 2023-05-24 DIAGNOSIS — I10 Essential (primary) hypertension: Secondary | ICD-10-CM | POA: Diagnosis not present

## 2023-05-24 DIAGNOSIS — I200 Unstable angina: Secondary | ICD-10-CM

## 2023-05-24 DIAGNOSIS — R079 Chest pain, unspecified: Secondary | ICD-10-CM | POA: Diagnosis not present

## 2023-05-24 DIAGNOSIS — I25118 Atherosclerotic heart disease of native coronary artery with other forms of angina pectoris: Secondary | ICD-10-CM | POA: Diagnosis not present

## 2023-05-24 HISTORY — PX: LEFT HEART CATH AND CORS/GRAFTS ANGIOGRAPHY: CATH118250

## 2023-05-24 LAB — CBC
HCT: 43.1 % (ref 36.0–46.0)
Hemoglobin: 13.8 g/dL (ref 12.0–15.0)
MCH: 28.6 pg (ref 26.0–34.0)
MCHC: 32 g/dL (ref 30.0–36.0)
MCV: 89.4 fL (ref 80.0–100.0)
Platelets: 379 10*3/uL (ref 150–400)
RBC: 4.82 MIL/uL (ref 3.87–5.11)
RDW: 15.9 % — ABNORMAL HIGH (ref 11.5–15.5)
WBC: 8.9 10*3/uL (ref 4.0–10.5)
nRBC: 0 % (ref 0.0–0.2)

## 2023-05-24 LAB — BASIC METABOLIC PANEL WITH GFR
Anion gap: 13 (ref 5–15)
BUN: 16 mg/dL (ref 8–23)
CO2: 25 mmol/L (ref 22–32)
Calcium: 9 mg/dL (ref 8.9–10.3)
Chloride: 102 mmol/L (ref 98–111)
Creatinine, Ser: 0.79 mg/dL (ref 0.44–1.00)
GFR, Estimated: >60 mL/min (ref >60)
Glucose, Bld: 117 mg/dL — ABNORMAL HIGH (ref 70–99)
Potassium: 3.7 mmol/L (ref 3.5–5.1)
Sodium: 140 mmol/L (ref 135–145)

## 2023-05-24 LAB — LIPID PANEL
Cholesterol: 145 mg/dL (ref 0–200)
HDL: 56 mg/dL (ref >40)
LDL Cholesterol: 54 mg/dL (ref 0–99)
Total CHOL/HDL Ratio: 2.6 ratio
Triglycerides: 175 mg/dL — ABNORMAL HIGH (ref <150)
VLDL: 35 mg/dL (ref 0–40)

## 2023-05-24 LAB — TROPONIN I (HIGH SENSITIVITY): Troponin I (High Sensitivity): 3 ng/L (ref <18)

## 2023-05-24 SURGERY — LEFT HEART CATH AND CORS/GRAFTS ANGIOGRAPHY
Anesthesia: LOCAL

## 2023-05-24 MED ORDER — LIDOCAINE HCL (PF) 1 % IJ SOLN
INTRAMUSCULAR | Status: DC | PRN
  Administered 2023-05-24: 5 mL

## 2023-05-24 MED ORDER — ASPIRIN 81 MG PO CHEW
81.0000 mg | CHEWABLE_TABLET | ORAL | Status: AC
  Administered 2023-05-24: 81 mg via ORAL
  Filled 2023-05-24: qty 1

## 2023-05-24 MED ORDER — SODIUM CHLORIDE 0.9 % WEIGHT BASED INFUSION
3.0000 mL/kg/h | INTRAVENOUS | Status: DC
  Administered 2023-05-24: 3 mL/kg/h via INTRAVENOUS

## 2023-05-24 MED ORDER — ASPIRIN 81 MG PO CHEW: 81.0000 mg | CHEWABLE_TABLET | ORAL | Status: DC

## 2023-05-24 MED ORDER — SODIUM CHLORIDE 0.9 % IV SOLN: 250.0000 mL | INTRAVENOUS | Status: AC | PRN

## 2023-05-24 MED ORDER — LIDOCAINE HCL (PF) 1 % IJ SOLN
INTRAMUSCULAR | Status: AC
  Filled 2023-05-24: qty 30

## 2023-05-24 MED ORDER — SODIUM CHLORIDE 0.9 % WEIGHT BASED INFUSION
1.0000 mL/kg/h | INTRAVENOUS | Status: DC
  Administered 2023-05-24 (×2): 1 mL/kg/h via INTRAVENOUS

## 2023-05-24 MED ORDER — MIDAZOLAM HCL 2 MG/2ML IJ SOLN
INTRAMUSCULAR | Status: DC | PRN
  Administered 2023-05-24: 2 mg via INTRAVENOUS

## 2023-05-24 MED ORDER — SODIUM CHLORIDE 0.9 % WEIGHT BASED INFUSION: 1.0000 mL/kg/h | INTRAVENOUS | Status: DC

## 2023-05-24 MED ORDER — FENTANYL CITRATE (PF) 100 MCG/2ML IJ SOLN
INTRAMUSCULAR | Status: DC | PRN
  Administered 2023-05-24: 25 ug via INTRAVENOUS

## 2023-05-24 MED ORDER — MORPHINE SULFATE (PF) 2 MG/ML IV SOLN
2.0000 mg | Freq: Once | INTRAVENOUS | Status: AC
  Administered 2023-05-24: 2 mg via INTRAVENOUS
  Filled 2023-05-24: qty 1

## 2023-05-24 MED ORDER — HEPARIN SODIUM (PORCINE) 1000 UNIT/ML IJ SOLN
INTRAMUSCULAR | Status: AC
  Filled 2023-05-24: qty 10

## 2023-05-24 MED ORDER — VERAPAMIL HCL 2.5 MG/ML IV SOLN
INTRAVENOUS | Status: AC
  Filled 2023-05-24: qty 2

## 2023-05-24 MED ORDER — HYDRALAZINE HCL 20 MG/ML IJ SOLN: 10.0000 mg | INTRAMUSCULAR | Status: DC | PRN

## 2023-05-24 MED ORDER — HEPARIN SODIUM (PORCINE) 1000 UNIT/ML IJ SOLN
INTRAMUSCULAR | Status: DC | PRN
  Administered 2023-05-24: 3500 [IU] via INTRAVENOUS

## 2023-05-24 MED ORDER — SODIUM CHLORIDE 0.9% FLUSH
3.0000 mL | Freq: Two times a day (BID) | INTRAVENOUS | Status: DC
  Administered 2023-05-24 – 2023-05-26 (×4): 3 mL via INTRAVENOUS

## 2023-05-24 MED ORDER — SODIUM CHLORIDE 0.9% FLUSH: 3.0000 mL | INTRAVENOUS | Status: DC | PRN

## 2023-05-24 MED ORDER — FENTANYL CITRATE (PF) 100 MCG/2ML IJ SOLN
INTRAMUSCULAR | Status: AC
  Filled 2023-05-24: qty 2

## 2023-05-24 MED ORDER — SODIUM CHLORIDE 0.9 % WEIGHT BASED INFUSION
3.0000 mL/kg/h | INTRAVENOUS | Status: DC
Start: 2023-05-25 — End: 2023-05-24

## 2023-05-24 MED ORDER — LACTATED RINGERS IV BOLUS: 500.0000 mL | Freq: Once | INTRAVENOUS | Status: DC

## 2023-05-24 MED ORDER — SODIUM CHLORIDE 0.9 % IV SOLN: INTRAVENOUS | Status: DC

## 2023-05-24 MED ORDER — HEPARIN (PORCINE) IN NACL 1000-0.9 UT/500ML-% IV SOLN
INTRAVENOUS | Status: DC | PRN
Start: 2023-05-24 — End: 2023-05-24
  Administered 2023-05-24 (×2): 500 mL

## 2023-05-24 MED ORDER — LABETALOL HCL 5 MG/ML IV SOLN: 10.0000 mg | INTRAVENOUS | Status: DC | PRN

## 2023-05-24 MED ORDER — MIDAZOLAM HCL 2 MG/2ML IJ SOLN
INTRAMUSCULAR | Status: AC
  Filled 2023-05-24: qty 2

## 2023-05-24 MED ORDER — IOHEXOL 350 MG/ML SOLN
INTRAVENOUS | Status: DC | PRN
  Administered 2023-05-24: 100 mL

## 2023-05-24 MED ORDER — HEPARIN (PORCINE) IN NACL 2-0.9 UNITS/ML
INTRAMUSCULAR | Status: DC | PRN
  Administered 2023-05-24: 10 mL via INTRA_ARTERIAL

## 2023-05-24 SURGICAL SUPPLY — 12 items
CATH 5FR JL3.5 JR4 ANG PIG MP (CATHETERS) IMPLANT
CATH INFINITI 5 FR RCB (CATHETERS) IMPLANT
CATH INFINITI 5FR AL1 (CATHETERS) IMPLANT
CATH LAUNCHER 5F EBU3.0 (CATHETERS) IMPLANT
CATHETER LAUNCHER 5F EBU3.0 (CATHETERS) ×1 IMPLANT
DEVICE RAD COMP TR BAND LRG (VASCULAR PRODUCTS) IMPLANT
GLIDESHEATH SLEND SS 6F .021 (SHEATH) IMPLANT
GUIDEWIRE INQWIRE 1.5J.035X260 (WIRE) IMPLANT
INQWIRE 1.5J .035X260CM (WIRE) ×1 IMPLANT
PACK CARDIAC CATHETERIZATION (CUSTOM PROCEDURE TRAY) ×1 IMPLANT
SET ATX-X65L (MISCELLANEOUS) IMPLANT
SHEATH PROBE COVER 6X72 (BAG) IMPLANT

## 2023-05-24 NOTE — Progress Notes (Addendum)
 Secure chat by RN about pt having chest pain with pt getting ntg x3 with partial relief. BP was 60s/40s. Pt was assessed at bedside. EKG obtained didn't show any changes from prior EKG. Troponins ordered and pending. Repeat BP measurements showing improvement so likely mediated by ntg. Pt on room air and satting well. Perfusing well. Stated her pain improved to 6/10 from 10/10. Given hypotension, will give 500 cc bolus. BP improved to 95/60. Would avoid ntg for now and order IV morphine for pain. Plan to have day team follow up troponin and alert cardiology of this event. Would start heparin gtt given this episode until pt get heart cath this afternoon.

## 2023-05-24 NOTE — Progress Notes (Addendum)
 HD#0 Subjective:  Overnight Events: No acute event overnight.  This morning, hypotensive to 60/40 following nitroglycerin x3.  She received bolus IV fluid and IV morphine for pain. Ordered EKG and troponin, both of which were normal.   Examined patient at the bedside, she is still having stabbing mid center chest pain that radiates to the left arm. No significant change or worsening since admission.  No nausea or vomiting.   Objective:  Vital signs in last 24 hours: Vitals:   05/24/23 0907 05/24/23 1015 05/24/23 1025 05/24/23 1428  BP: 125/70 114/78  (!) 157/86  Pulse:  64  65  Resp:  15  16  Temp:   98 F (36.7 C)   TempSrc:   Oral Oral  SpO2:  99%  97%  Weight:      Height:       Supplemental O2: Room Air SpO2: 99 % FiO2 (%): 21 %  Physical Exam:   General: Sitting in bed, NAD. CV: RRR. No m/r/g. No LE edema Pulmonary: Lungs CTAB. Normal effort. No wheezing or rales. Abdominal: Soft, nontender, nondistended. Normal bowel sounds. Skin: Warm and dry. No obvious rash or lesions.  Filed Weights   05/23/23 1428  Weight: 69 kg    No intake or output data in the 24 hours ending 05/24/23 1354 Net IO Since Admission: No IO data has been entered for this period [05/24/23 1354]  No results for input(s): "GLUCAP" in the last 72 hours.   Pertinent Labs:    Latest Ref Rng & Units 05/23/2023   10:31 AM 08/22/2022    1:26 PM 01/22/2022    7:45 PM  CBC  WBC 4.0 - 10.5 K/uL 8.8  18.1  12.6   Hemoglobin 12.0 - 15.0 g/dL 16.1  09.6  04.5   Hematocrit 36.0 - 46.0 % 44.0  46.0  42.3   Platelets 150 - 400 K/uL 383  528  630        Latest Ref Rng & Units 05/24/2023    6:56 AM 05/23/2023   10:31 AM 08/22/2022    1:26 PM  CMP  Glucose 70 - 99 mg/dL 409  811  914   BUN 8 - 23 mg/dL 16  8  26    Creatinine 0.44 - 1.00 mg/dL 7.82  9.56  2.13   Sodium 135 - 145 mmol/L 140  138  130   Potassium 3.5 - 5.1 mmol/L 3.7  5.0  2.8   Chloride 98 - 111 mmol/L 102  104  91   CO2 22 -  32 mmol/L 25  22  23    Calcium 8.9 - 10.3 mg/dL 9.0  9.3  9.4   Total Protein 6.5 - 8.1 g/dL   8.4   Total Bilirubin 0.3 - 1.2 mg/dL   1.2   Alkaline Phos 38 - 126 U/L   102   AST 15 - 41 U/L   20   ALT 0 - 44 U/L   17     Imaging: No results found.  Assessment/Plan:   Active Problems:   Hypertension   Unstable angina (HCC)   Hx of CABG   Patient Summary: Lindsay Rivera is a 63 y.o. with a pertinent PMH of CABG 02-2013, stent LIMA (07-2015), HTN , who presented with worsening chest pain and admitted for unstable angina.   #Unstable angina #History of MI, CAD s/p CABG (2015) and DES to LM (2017) Stable chest pain, radiating to the left arm.  No change  in EKG, no troponin elevation.   - Cards following, appreciate recs. - NPO for LHC this afternoon. - Nitrates PRN for pain. - Follow-up on echocardiogram. - Holding Amlodipine and carvedilol.  #Hypothyroidism. Normal TSH and T4. - Continue with levothyroxine 88 mcg daily.  # Hyperlipidemia LDL at goal, normal A1c. - Will resume Zetia.  # Chronic medical conditions  #Anxiety/depression - Xanax 0.5 mg twice daily, and Prozac 60 mg   # COPD  Cw Beztri BID   Diet: N.p.o. IVF:  N/A VTE: SCDs Start: 05/23/23 1724 Code: Full PT/OT: Pending ID:  Anti-infectives (From admission, onward)    None        Anticipated discharge pending medical stabilization.  Marni Sins, MD 05/24/2023, 1:54 PM Pager: [phone] [facility]

## 2023-05-24 NOTE — Progress Notes (Signed)
 Patient c/o chest pain and chest tightness 7 out 10 , with SOB and nausea. Sent text to Jackolyn Masker, MD  regarding patient receiving 3 ntg without much relief . Charge RN Adriana Hopping was called and made aware. EKG and Troponin ordered . Patient still c/o 6 out 10 chest pain.  B/P also dropped to 60's over 40's . Dr Meredeth Stallion came to the bedside to assess patient and order a 500 cc fluid bolus and Morphine for pain once B/P has improved. Patient is now resting and b/p is 92/64(73)

## 2023-05-24 NOTE — Progress Notes (Signed)
 Progress Note  Patient Name: Lindsay Rivera Date of Encounter: 05/24/2023 Primary Cardiologist: Ahmad Alert, MD   Subjective   Overnight 6/10 chest pain. Normal troponin.  No change in EKG  Vital Signs    Vitals:   05/24/23 0800 05/24/23 0907 05/24/23 1015 05/24/23 1025  BP: (!) 85/55 125/70 114/78   Pulse: (!) 59  64   Resp: 19  15   Temp:    98 F (36.7 C)  TempSrc:    Oral  SpO2: 96%  99%   Weight:      Height:       No intake or output data in the 24 hours ending 05/24/23 1106 Filed Weights   05/23/23 1428  Weight: 69 kg    Physical Exam   GEN: No acute distress.   Neck: No JVD Cardiac: regular bradycardia, no murmurs, rubs, or gallops.  Respiratory: Clear to auscultation bilaterally. GI: Soft, nontender, non-distended  MS: No edema  Labs   EKG: SR with bi-atrial enlargement and anterior infarct pattern Telemetry: SR   Chemistry Recent Labs  Lab 05/23/23 1031 05/24/23 0656  NA 138 140  K 5.0 3.7  CL 104 102  CO2 22 25  GLUCOSE 101* 117*  BUN 8 16  CREATININE 0.75 0.79  CALCIUM 9.3 9.0  GFRNONAA >60 >60  ANIONGAP 12 13     Hematology Recent Labs  Lab 05/23/23 1031  WBC 8.8  RBC 4.86  HGB 13.8  HCT 44.0  MCV 90.5  MCH 28.4  MCHC 31.4  RDW 15.9*  PLT 383    Cardiac EnzymesNo results for input(s): "TROPONINI" in the last 168 hours. No results for input(s): "TROPIPOC" in the last 168 hours.   BNPNo results for input(s): "BNP", "PROBNP" in the last 168 hours.   DDimer No results for input(s): "DDIMER" in the last 168 hours.   Cardiac Studies   Cardiac Studies & Procedures   ______________________________________________________________________________________________ CARDIAC CATHETERIZATION  CARDIAC CATHETERIZATION 11/29/2017  Conclusion  Previously placed Ost LAD to Prox LAD stent (unknown type) is widely patent.  2nd Mrg lesion is 50% stenosed.  Mid Cx to Dist Cx lesion is 50% stenosed.  Mid LAD to Dist LAD  lesion is 40% stenosed.  Mid RCA to Dist RCA lesion is 100% stenosed.  SVG graft was visualized by angiography and is normal in caliber.  SVG graft was visualized by angiography and is normal in caliber.  LIMA graft was visualized by angiography and is small.  Mid Graft to Dist Graft lesion is 100% stenosed.  The left ventricular systolic function is normal.  LV end diastolic pressure is normal.  The left ventricular ejection fraction is 55-65% by visual estimate.  1. Single vessel occlusive CAD. 100% RCA after the RV marginal branch. The stent in the ostial LAD is patent. 2. Atretic LIMA to the LAD 3. Patent free RIMA to the diagonal 4. Patent SVG to the distal RCA 5. Normal LV function 6. Normal LVEDP  Plan: medical management.  Recommend Aspirin 81mg  daily for moderate CAD.  Findings Coronary Findings Diagnostic  Dominance: Right  Left Anterior Descending Previously placed Ost LAD to Prox LAD stent (unknown type) is widely patent. Mid LAD to Dist LAD lesion is 40% stenosed.  Left Circumflex Mid Cx to Dist Cx lesion is 50% stenosed.  Second Obtuse Marginal Branch 2nd Mrg lesion is 50% stenosed.  Right Coronary Artery Mid RCA to Dist RCA lesion is 100% stenosed.  Saphenous Graft To Dist RCA SVG  graft was visualized by angiography and is normal in caliber.  Saphenous Graft To 2nd Diag SVG graft was visualized by angiography and is normal in caliber.  LIMA LIMA Graft To Mid LAD LIMA graft was visualized by angiography and is small. The graft is atretic. Mid Graft to Dist Graft lesion is 100% stenosed.  Intervention  No interventions have been documented.   CARDIAC CATHETERIZATION  CARDIAC CATHETERIZATION 04/17/2015  Conclusion  Prox RCA lesion, 30% stenosed.  Mid RCA lesion, 60% stenosed.  Dist RCA lesion, 90% stenosed.  SVG was injected is normal in caliber, and is anatomically normal.  RIMA was injected is normal in caliber, and is  anatomically normal.  Free RIMA graft patent to diagonal.  Mid Cx lesion, 50% stenosed.  LIMA was injected .  There is severe disease in the graft.  Atretic LIMA graft which had supplied the LAD.  Mid Graft to Dist Graft lesion, 100% stenosed.  LM lesion, 85% stenosed. Post intervention, there is a 0% residual stenosis.  The left ventricular systolic function is normal.  Findings Coronary Findings Diagnostic  Dominance: Right  Left Main  Left Circumflex  Right Coronary Artery  Single Graft Graft To Dist RCA SVG was injected is normal in caliber, and is anatomically normal.  RIMA Graft To 1st Diag RIMA was injected is normal in caliber, and is anatomically normal. Free RIMA graft patent to diagonal.  LIMA LIMA Graft To Dist LAD LIMA was injected . There is severe disease in the graft. Atretic LIMA graft which had supplied the LAD.  Intervention  LM lesion PCI The pre-interventional distal flow is normal (TIMI 3). Pre-stent angioplasty was performed. A drug-eluting stent was placed. The strut is apposed. Post-stent angioplasty was performed. The post-interventional distal flow is normal (TIMI 3). The intervention was successful. Supplies used: BALLN ANGIOSCULPT RX 2.5X6; STENT XIENCE ALPINE RX 3.0X8; BALLN [place] EMERGE MR 3.5X6 There is a 0% residual stenosis post intervention.   STRESS TESTS  NM MYOCAR MULTI W/SPECT W 11/28/2017  Narrative  There was no ST segment deviation noted during stress.  T wave inversion was noted during stress in the I and aVL leads, beginning at 0 minutes of stress. T wave inversion persisted.  There is a small defect of mild severity present in the basal inferolateral and mid inferolateral location. The defect is reversible. Likely related to attenuation artifact but cannot rule out a subtle area of ischemia.  There is a small defect of mild severity present in the mid anterior and apical anterior location. The defect is partially  reversible. This is likely related to variations in breast attenuation artifact but cannot rule out a small area of ischemia.  The left ventricular ejection fraction is normal (55-65%).  Nuclear stress EF: 65%.  This is a low to intermediate risk study but poor quality. There is significant extracardiac uptake as well as breast and diaphragmatic attenuation. Counts on stress images are reduced in all areas compared to rest images making interpretation difficult.   ECHOCARDIOGRAM  ECHOCARDIOGRAM COMPLETE 01/13/2016  Narrative *[place]* *[facility]* [address] [place], [place] [address] [phone]  ------------------------------------------------------------------- Transthoracic Echocardiography  Patient:    Taneka, Espiritu MR #:       [id number] Study Date: 01/13/2016 Gender:     F Age:        55 Height:     157.5 cm Weight:     75.8 kg BSA:        1.85 m^2  Pt. Status: Room:  ATTENDING    Albin Felling PERFORMING   [facility], Outpatient SONOGRAPHER  Lysbeth Galas, RDCS  cc:  ------------------------------------------------------------------- LV EF: 60% -   65%  ------------------------------------------------------------------- Indications:      Dyspnea 786.09.  ------------------------------------------------------------------- History:   PMH:   Congestive heart failure.  Stroke.  Risk factors: Hypertension. Diabetes mellitus.  ------------------------------------------------------------------- Study Conclusions  - Left ventricle: The cavity size was normal. Wall thickness was normal. Systolic function was normal. The estimated ejection fraction was in the range of 60% to 65%. Wall motion was normal; there were no regional wall motion abnormalities. Features are consistent with a pseudonormal left ventricular filling pattern, with concomitant abnormal relaxation and increased  filling pressure (grade 2 diastolic dysfunction). - Aortic valve: There was no stenosis. - Mitral valve: There was no significant regurgitation. - Right ventricle: Poorly visualized. The cavity size was normal. Systolic function was normal. - Tricuspid valve: Peak RV-RA gradient (S): 20 mm Hg. - Pulmonary arteries: PA peak pressure: 23 mm Hg (S). - Inferior vena cava: The vessel was normal in size. The respirophasic diameter changes were in the normal range (>= 50%), consistent with normal central venous pressure.  Impressions:  - Normal LV size with EF 60-65%. Moderate diastolic dysfunction. Normal RV size and systolic function. No significant valvular abnormalities.  ------------------------------------------------------------------- Study data:  No prior study was available for comparison.  Study status:  Routine.  Procedure:  The patient reported no pain pre or post test. Transthoracic echocardiography. Image quality was adequate.  Study completion:  There were no complications. Transthoracic echocardiography.  M-mode, complete 2D, spectral Doppler, and color Doppler.  Birthdate:  Patient birthdate: 11-01-1960.  Age:  Patient is 63 yr old.  Sex:  Gender: female. BMI: 30.5 kg/m^2.  Blood pressure:     130/80  Patient status: Outpatient.  Study date:  Study date: 01/13/2016. Study time: 08:54 AM.  Location:  Echo laboratory.  -------------------------------------------------------------------  ------------------------------------------------------------------- Left ventricle:  The cavity size was normal. Wall thickness was normal. Systolic function was normal. The estimated ejection fraction was in the range of 60% to 65%. Wall motion was normal; there were no regional wall motion abnormalities. Features are consistent with a pseudonormal left ventricular filling pattern, with concomitant abnormal relaxation and increased filling pressure (grade 2 diastolic  dysfunction).  ------------------------------------------------------------------- Aortic valve:   Trileaflet.  Doppler:   There was no stenosis. There was no regurgitation.  ------------------------------------------------------------------- Aorta:  Aortic root: The aortic root was normal in size. Ascending aorta: The ascending aorta was normal in size.  ------------------------------------------------------------------- Mitral valve:   Normal thickness leaflets .  Doppler:   There was no evidence for stenosis.   There was no significant regurgitation.  ------------------------------------------------------------------- Left atrium:  The atrium was normal in size.  ------------------------------------------------------------------- Right ventricle:  Poorly visualized. The cavity size was normal. Systolic function was normal.  ------------------------------------------------------------------- Pulmonic valve:    Structurally normal valve.   Cusp separation was normal.  Doppler:  Transvalvular velocity was within the normal range. There was no regurgitation.  ------------------------------------------------------------------- Tricuspid valve:   Doppler:  There was trivial regurgitation.  ------------------------------------------------------------------- Right atrium:  The atrium was normal in size.  ------------------------------------------------------------------- Pericardium:  There was no pericardial effusion.  ------------------------------------------------------------------- Systemic veins: Inferior vena cava: The vessel was normal in size. The respirophasic diameter changes were in the normal range (>= 50%), consistent with normal central venous pressure.  -------------------------------------------------------------------  Measurements  Left ventricle                         Value        Reference LV ID, ED, PLAX chordal        (L)     40.9  mm     43 - 52 LV ID,  ES, PLAX chordal                30.7  mm     23 - 38 LV fx shortening, PLAX chordal (L)     25    %      >=29 LV PW thickness, ED                    9.36  mm     --------- IVS/LV PW ratio, ED                    0.87         <=1.3 Stroke volume, 2D                      50    ml     --------- Stroke volume/bsa, 2D                  27    ml/m^2 --------- LV e&', lateral                         8.11  cm/s   --------- LV E/e&', lateral                       7.13         --------- LV e&', medial                          4.39  cm/s   --------- LV E/e&', medial                        13.17        --------- LV e&', average                         6.25  cm/s   --------- LV E/e&', average                       9.25         ---------  Ventricular septum                     Value        Reference IVS thickness, ED                      8.11  mm     ---------  LVOT                                   Value        Reference LVOT ID, S                             18    mm     --------- LVOT area  2.54  cm^2   --------- LVOT peak velocity, S                  92.5  cm/s   --------- LVOT mean velocity, S                  62.6  cm/s   --------- LVOT VTI, S                            19.7  cm     --------- LVOT peak gradient, S                  3     mm Hg  ---------  Aorta                                  Value        Reference Aortic root ID, ED                     27    mm     ---------  Left atrium                            Value        Reference LA ID, A-P, ES                         32    mm     --------- LA ID/bsa, A-P                         1.73  cm/m^2 <=2.2 LA volume, S                           28    ml     --------- LA volume/bsa, S                       15.2  ml/m^2 --------- LA volume, ES, 1-p A4C                 27    ml     --------- LA volume/bsa, ES, 1-p A4C             14.6  ml/m^2 --------- LA volume, ES, 1-p A2C                 28    ml     --------- LA  volume/bsa, ES, 1-p A2C             15.2  ml/m^2 ---------  Mitral valve                           Value        Reference Mitral E-wave peak velocity            57.8  cm/s   --------- Mitral A-wave peak velocity            50.8  cm/s   --------- Mitral deceleration time               222   ms     150 - 230 Mitral E/A ratio, peak  1.1          ---------  Pulmonary arteries                     Value        Reference PA pressure, S, DP                     23    mm Hg  <=30  Tricuspid valve                        Value        Reference Tricuspid regurg peak velocity         221   cm/s   --------- Tricuspid peak RV-RA gradient          20    mm Hg  ---------  Systemic veins                         Value        Reference Estimated CVP                          3     mm Hg  ---------  Legend: (L)  and  (H)  mark values outside specified reference range.  ------------------------------------------------------------------- Prepared and Electronically Authenticated by  Peder Bourdon, M.D. 2017-12-06T16:22:04          ______________________________________________________________________________________________       Assessment & Plan   Coronary artery disease s/p CABG (2015), DES to LM (2017) Persistent chest pain - has PRN Nitrates for pain - LHC- Grafts Case today - I have ordered an echocardiogram for today   Hypertension - one isolated episode of hypertension - BP well managed on current regimen  Hyperlipidemia  - lipids pending - will resume Zetia (previously not adherend to this medication   Per primary Hypothyroidism  Diabetes Bipolar disorder Chronic low back pain   For questions or updates, please contact [facility] Please consult [email or web address] for contact info under Cardiology/STEMI.      Gloriann Larger, MD FASE [facility] Cardiologist [facility]  [address] [place], [address] [place], [place] [address] [phone]  11:06  AM

## 2023-05-24 NOTE — Interval H&P Note (Signed)
 History and Physical Interval Note:  05/24/2023 6:25 PM  Lindsay Rivera  has presented today for surgery, with the diagnosis of chest pain.  The various methods of treatment have been discussed with the patient and family. After consideration of risks, benefits and other options for treatment, the patient has consented to  Procedure(s): LEFT HEART CATH AND CORS/GRAFTS ANGIOGRAPHY (N/A)  PERCUTANEOUS CORONARY INTERVENTION  as a surgical intervention.  The patient's history has been reviewed, patient examined, no change in status, stable for surgery.  I have reviewed the patient's chart and labs.  Questions were answered to the patient's satisfaction.    Cath Lab Visit (complete for each Cath Lab visit)  Clinical Evaluation Leading to the Procedure:   ACS: No.  Non-ACS:    Anginal Classification: CCS III  Anti-ischemic medical therapy: Maximal Therapy (2 or more classes of medications)  Non-Invasive Test Results: No non-invasive testing performed  Prior CABG: Previous CABG     Randene Bustard

## 2023-05-24 NOTE — Care Management Obs Status (Signed)
 MEDICARE OBSERVATION STATUS NOTIFICATION   Patient Details  Name: Lindsay Rivera MRN: [id number] Date of Birth: 25-Oct-1960   Medicare Observation Status Notification Given:  Yes    Janith Melnick 05/24/2023, 3:52 PM

## 2023-05-24 NOTE — H&P (View-Only) (Signed)
 Progress Note  Patient Name: Lindsay Rivera Date of Encounter: 05/24/2023 Primary Cardiologist: Kristeen Miss, MD   Subjective   Overnight 6/10 chest pain. Normal troponin.  No change in EKG  Vital Signs    Vitals:   05/24/23 0800 05/24/23 0907 05/24/23 1015 05/24/23 1025  BP: (!) 85/55 125/70 114/78   Pulse: (!) 59  64   Resp: 19  15   Temp:    98 F (36.7 C)  TempSrc:    Oral  SpO2: 96%  99%   Weight:      Height:       No intake or output data in the 24 hours ending 05/24/23 1106 Filed Weights   05/23/23 1428  Weight: 69 kg    Physical Exam   GEN: No acute distress.   Neck: No JVD Cardiac: regular bradycardia, no murmurs, rubs, or gallops.  Respiratory: Clear to auscultation bilaterally. GI: Soft, nontender, non-distended  MS: No edema  Labs   EKG: SR with bi-atrial enlargement and anterior infarct pattern Telemetry: SR   Chemistry Recent Labs  Lab 05/23/23 1031 05/24/23 0656  NA 138 140  K 5.0 3.7  CL 104 102  CO2 22 25  GLUCOSE 101* 117*  BUN 8 16  CREATININE 0.75 0.79  CALCIUM 9.3 9.0  GFRNONAA >60 >60  ANIONGAP 12 13     Hematology Recent Labs  Lab 05/23/23 1031  WBC 8.8  RBC 4.86  HGB 13.8  HCT 44.0  MCV 90.5  MCH 28.4  MCHC 31.4  RDW 15.9*  PLT 383    Cardiac EnzymesNo results for input(s): "TROPONINI" in the last 168 hours. No results for input(s): "TROPIPOC" in the last 168 hours.   BNPNo results for input(s): "BNP", "PROBNP" in the last 168 hours.   DDimer No results for input(s): "DDIMER" in the last 168 hours.   Cardiac Studies   Cardiac Studies & Procedures   ______________________________________________________________________________________________ CARDIAC CATHETERIZATION  CARDIAC CATHETERIZATION 11/29/2017  Conclusion  Previously placed Ost LAD to Prox LAD stent (unknown type) is widely patent.  2nd Mrg lesion is 50% stenosed.  Mid Cx to Dist Cx lesion is 50% stenosed.  Mid LAD to Dist LAD  lesion is 40% stenosed.  Mid RCA to Dist RCA lesion is 100% stenosed.  SVG graft was visualized by angiography and is normal in caliber.  SVG graft was visualized by angiography and is normal in caliber.  LIMA graft was visualized by angiography and is small.  Mid Graft to Dist Graft lesion is 100% stenosed.  The left ventricular systolic function is normal.  LV end diastolic pressure is normal.  The left ventricular ejection fraction is 55-65% by visual estimate.  1. Single vessel occlusive CAD. 100% RCA after the RV marginal branch. The stent in the ostial LAD is patent. 2. Atretic LIMA to the LAD 3. Patent free RIMA to the diagonal 4. Patent SVG to the distal RCA 5. Normal LV function 6. Normal LVEDP  Plan: medical management.  Recommend Aspirin 81mg  daily for moderate CAD.  Findings Coronary Findings Diagnostic  Dominance: Right  Left Anterior Descending Previously placed Ost LAD to Prox LAD stent (unknown type) is widely patent. Mid LAD to Dist LAD lesion is 40% stenosed.  Left Circumflex Mid Cx to Dist Cx lesion is 50% stenosed.  Second Obtuse Marginal Branch 2nd Mrg lesion is 50% stenosed.  Right Coronary Artery Mid RCA to Dist RCA lesion is 100% stenosed.  Saphenous Graft To Dist RCA SVG  graft was visualized by angiography and is normal in caliber.  Saphenous Graft To 2nd Diag SVG graft was visualized by angiography and is normal in caliber.  LIMA LIMA Graft To Mid LAD LIMA graft was visualized by angiography and is small. The graft is atretic. Mid Graft to Dist Graft lesion is 100% stenosed.  Intervention  No interventions have been documented.   CARDIAC CATHETERIZATION  CARDIAC CATHETERIZATION 04/17/2015  Conclusion  Prox RCA lesion, 30% stenosed.  Mid RCA lesion, 60% stenosed.  Dist RCA lesion, 90% stenosed.  SVG was injected is normal in caliber, and is anatomically normal.  RIMA was injected is normal in caliber, and is  anatomically normal.  Free RIMA graft patent to diagonal.  Mid Cx lesion, 50% stenosed.  LIMA was injected .  There is severe disease in the graft.  Atretic LIMA graft which had supplied the LAD.  Mid Graft to Dist Graft lesion, 100% stenosed.  LM lesion, 85% stenosed. Post intervention, there is a 0% residual stenosis.  The left ventricular systolic function is normal.  Findings Coronary Findings Diagnostic  Dominance: Right  Left Main  Left Circumflex  Right Coronary Artery  Single Graft Graft To Dist RCA SVG was injected is normal in caliber, and is anatomically normal.  RIMA Graft To 1st Diag RIMA was injected is normal in caliber, and is anatomically normal. Free RIMA graft patent to diagonal.  LIMA LIMA Graft To Dist LAD LIMA was injected . There is severe disease in the graft. Atretic LIMA graft which had supplied the LAD.  Intervention  LM lesion PCI The pre-interventional distal flow is normal (TIMI 3). Pre-stent angioplasty was performed. A drug-eluting stent was placed. The strut is apposed. Post-stent angioplasty was performed. The post-interventional distal flow is normal (TIMI 3). The intervention was successful. Supplies used: BALLN ANGIOSCULPT RX 2.5X6; STENT XIENCE ALPINE RX 3.0X8; BALLN [place] EMERGE MR 3.5X6 There is a 0% residual stenosis post intervention.   STRESS TESTS  NM MYOCAR MULTI W/SPECT W 11/28/2017  Narrative  There was no ST segment deviation noted during stress.  T wave inversion was noted during stress in the I and aVL leads, beginning at 0 minutes of stress. T wave inversion persisted.  There is a small defect of mild severity present in the basal inferolateral and mid inferolateral location. The defect is reversible. Likely related to attenuation artifact but cannot rule out a subtle area of ischemia.  There is a small defect of mild severity present in the mid anterior and apical anterior location. The defect is partially  reversible. This is likely related to variations in breast attenuation artifact but cannot rule out a small area of ischemia.  The left ventricular ejection fraction is normal (55-65%).  Nuclear stress EF: 65%.  This is a low to intermediate risk study but poor quality. There is significant extracardiac uptake as well as breast and diaphragmatic attenuation. Counts on stress images are reduced in all areas compared to rest images making interpretation difficult.   ECHOCARDIOGRAM  ECHOCARDIOGRAM COMPLETE 01/13/2016  Narrative *[facility]* *[facility]* [address] [place], [place] [address] [phone]  ------------------------------------------------------------------- Transthoracic Echocardiography  Patient:    Lindsay Rivera, Lindsay Rivera MR #:       [id number] Study Date: 01/13/2016 Gender:     F Age:        63 Height:     157.5 cm Weight:     75.8 kg BSA:        1.85 m^2  Pt. Status: Room:  ATTENDING    Albin Felling PERFORMING   [facility], Outpatient SONOGRAPHER  Lysbeth Galas, RDCS  cc:  ------------------------------------------------------------------- LV EF: 60% -   65%  ------------------------------------------------------------------- Indications:      Dyspnea 786.09.  ------------------------------------------------------------------- History:   PMH:   Congestive heart failure.  Stroke.  Risk factors: Hypertension. Diabetes mellitus.  ------------------------------------------------------------------- Study Conclusions  - Left ventricle: The cavity size was normal. Wall thickness was normal. Systolic function was normal. The estimated ejection fraction was in the range of 60% to 65%. Wall motion was normal; there were no regional wall motion abnormalities. Features are consistent with a pseudonormal left ventricular filling pattern, with concomitant abnormal relaxation and increased  filling pressure (grade 2 diastolic dysfunction). - Aortic valve: There was no stenosis. - Mitral valve: There was no significant regurgitation. - Right ventricle: Poorly visualized. The cavity size was normal. Systolic function was normal. - Tricuspid valve: Peak RV-RA gradient (S): 20 mm Hg. - Pulmonary arteries: PA peak pressure: 23 mm Hg (S). - Inferior vena cava: The vessel was normal in size. The respirophasic diameter changes were in the normal range (>= 50%), consistent with normal central venous pressure.  Impressions:  - Normal LV size with EF 60-65%. Moderate diastolic dysfunction. Normal RV size and systolic function. No significant valvular abnormalities.  ------------------------------------------------------------------- Study data:  No prior study was available for comparison.  Study status:  Routine.  Procedure:  The patient reported no pain pre or post test. Transthoracic echocardiography. Image quality was adequate.  Study completion:  There were no complications. Transthoracic echocardiography.  M-mode, complete 2D, spectral Doppler, and color Doppler.  Birthdate:  Patient birthdate: 11-01-1960.  Age:  Patient is 63 yr old.  Sex:  Gender: female. BMI: 30.5 kg/m^2.  Blood pressure:     130/80  Patient status: Outpatient.  Study date:  Study date: 01/13/2016. Study time: 08:54 AM.  Location:  Echo laboratory.  -------------------------------------------------------------------  ------------------------------------------------------------------- Left ventricle:  The cavity size was normal. Wall thickness was normal. Systolic function was normal. The estimated ejection fraction was in the range of 60% to 65%. Wall motion was normal; there were no regional wall motion abnormalities. Features are consistent with a pseudonormal left ventricular filling pattern, with concomitant abnormal relaxation and increased filling pressure (grade 2 diastolic  dysfunction).  ------------------------------------------------------------------- Aortic valve:   Trileaflet.  Doppler:   There was no stenosis. There was no regurgitation.  ------------------------------------------------------------------- Aorta:  Aortic root: The aortic root was normal in size. Ascending aorta: The ascending aorta was normal in size.  ------------------------------------------------------------------- Mitral valve:   Normal thickness leaflets .  Doppler:   There was no evidence for stenosis.   There was no significant regurgitation.  ------------------------------------------------------------------- Left atrium:  The atrium was normal in size.  ------------------------------------------------------------------- Right ventricle:  Poorly visualized. The cavity size was normal. Systolic function was normal.  ------------------------------------------------------------------- Pulmonic valve:    Structurally normal valve.   Cusp separation was normal.  Doppler:  Transvalvular velocity was within the normal range. There was no regurgitation.  ------------------------------------------------------------------- Tricuspid valve:   Doppler:  There was trivial regurgitation.  ------------------------------------------------------------------- Right atrium:  The atrium was normal in size.  ------------------------------------------------------------------- Pericardium:  There was no pericardial effusion.  ------------------------------------------------------------------- Systemic veins: Inferior vena cava: The vessel was normal in size. The respirophasic diameter changes were in the normal range (>= 50%), consistent with normal central venous pressure.  -------------------------------------------------------------------  Measurements  Left ventricle                         Value        Reference LV ID, ED, PLAX chordal        (L)     40.9  mm     43 - 52 LV ID,  ES, PLAX chordal                30.7  mm     23 - 38 LV fx shortening, PLAX chordal (L)     25    %      >=29 LV PW thickness, ED                    9.36  mm     --------- IVS/LV PW ratio, ED                    0.87         <=1.3 Stroke volume, 2D                      50    ml     --------- Stroke volume/bsa, 2D                  27    ml/m^2 --------- LV e&', lateral                         8.11  cm/s   --------- LV E/e&', lateral                       7.13         --------- LV e&', medial                          4.39  cm/s   --------- LV E/e&', medial                        13.17        --------- LV e&', average                         6.25  cm/s   --------- LV E/e&', average                       9.25         ---------  Ventricular septum                     Value        Reference IVS thickness, ED                      8.11  mm     ---------  LVOT                                   Value        Reference LVOT ID, S                             18    mm     --------- LVOT area  2.54  cm^2   --------- LVOT peak velocity, S                  92.5  cm/s   --------- LVOT mean velocity, S                  62.6  cm/s   --------- LVOT VTI, S                            19.7  cm     --------- LVOT peak gradient, S                  3     mm Hg  ---------  Aorta                                  Value        Reference Aortic root ID, ED                     27    mm     ---------  Left atrium                            Value        Reference LA ID, A-P, ES                         32    mm     --------- LA ID/bsa, A-P                         1.73  cm/m^2 <=2.2 LA volume, S                           28    ml     --------- LA volume/bsa, S                       15.2  ml/m^2 --------- LA volume, ES, 1-p A4C                 27    ml     --------- LA volume/bsa, ES, 1-p A4C             14.6  ml/m^2 --------- LA volume, ES, 1-p A2C                 28    ml     --------- LA  volume/bsa, ES, 1-p A2C             15.2  ml/m^2 ---------  Mitral valve                           Value        Reference Mitral E-wave peak velocity            57.8  cm/s   --------- Mitral A-wave peak velocity            50.8  cm/s   --------- Mitral deceleration time               222   ms     150 - 230 Mitral E/A ratio, peak  1.1          ---------  Pulmonary arteries                     Value        Reference PA pressure, S, DP                     23    mm Hg  <=30  Tricuspid valve                        Value        Reference Tricuspid regurg peak velocity         221   cm/s   --------- Tricuspid peak RV-RA gradient          20    mm Hg  ---------  Systemic veins                         Value        Reference Estimated CVP                          3     mm Hg  ---------  Legend: (L)  and  (H)  mark values outside specified reference range.  ------------------------------------------------------------------- Prepared and Electronically Authenticated by  Peder Bourdon, M.D. 2017-12-06T16:22:04          ______________________________________________________________________________________________       Assessment & Plan   Coronary artery disease s/p CABG (2015), DES to LM (2017) Persistent chest pain - has PRN Nitrates for pain - LHC- Grafts Case today - I have ordered an echocardiogram for today   Hypertension - one isolated episode of hypertension - BP well managed on current regimen  Hyperlipidemia  - lipids pending - will resume Zetia (previously not adherend to this medication   Per primary Hypothyroidism  Diabetes Bipolar disorder Chronic low back pain   For questions or updates, please contact [facility] Please consult [email or web address] for contact info under Cardiology/STEMI.      Gloriann Larger, MD FASE [facility] Cardiologist [facility]  [address] [place], [address] [place], [place] [address] [phone]  11:06  AM

## 2023-05-24 NOTE — Care Management Obs Status (Signed)
 MEDICARE OBSERVATION STATUS NOTIFICATION   Patient Details  Name: Lindsay Rivera MRN: [id number] Date of Birth: 1960-12-03   Medicare Observation Status Notification Given:  Yes    Janith Melnick 05/24/2023, 3:51 PM

## 2023-05-24 NOTE — Progress Notes (Signed)
   05/24/23 1801  Mobility  Activity Ambulated with assistance in hallway  Level of Assistance Standby assist, set-up cues, supervision of patient - no hands on  Assistive Device Front wheel walker  Distance Ambulated (ft) 50 ft  Activity Response Tolerated fair  Mobility Referral Yes  Mobility visit 1 Mobility  Mobility Specialist Start Time (ACUTE ONLY) 1801  Mobility Specialist Stop Time (ACUTE ONLY) 1818  Mobility Specialist Time Calculation (min) (ACUTE ONLY) 17 min   Mobility Specialist: Progress Note  Pre-Mobility:      HR 60 During Mobility: HR 65 Post-Mobility:    HR 60  Pt agreeable to mobility session - received in bed. C/o chest pain - RN notified via secure chat. Returned to bed with all needs met - call bell within reach.   Isla Mari, BS Mobility Specialist Please contact via SecureChat or  Rehab office at [phone].

## 2023-05-25 ENCOUNTER — Encounter (HOSPITAL_COMMUNITY): Payer: Self-pay | Admitting: Cardiology

## 2023-05-25 ENCOUNTER — Other Ambulatory Visit: Payer: Self-pay

## 2023-05-25 ENCOUNTER — Observation Stay (HOSPITAL_BASED_OUTPATIENT_CLINIC_OR_DEPARTMENT_OTHER)

## 2023-05-25 DIAGNOSIS — I25110 Atherosclerotic heart disease of native coronary artery with unstable angina pectoris: Secondary | ICD-10-CM | POA: Diagnosis not present

## 2023-05-25 DIAGNOSIS — R011 Cardiac murmur, unspecified: Secondary | ICD-10-CM | POA: Diagnosis not present

## 2023-05-25 DIAGNOSIS — F1290 Cannabis use, unspecified, uncomplicated: Secondary | ICD-10-CM | POA: Diagnosis not present

## 2023-05-25 DIAGNOSIS — Z87891 Personal history of nicotine dependence: Secondary | ICD-10-CM | POA: Diagnosis not present

## 2023-05-25 DIAGNOSIS — R079 Chest pain, unspecified: Secondary | ICD-10-CM | POA: Diagnosis not present

## 2023-05-25 DIAGNOSIS — K219 Gastro-esophageal reflux disease without esophagitis: Secondary | ICD-10-CM | POA: Diagnosis not present

## 2023-05-25 DIAGNOSIS — Z91040 Latex allergy status: Secondary | ICD-10-CM | POA: Diagnosis not present

## 2023-05-25 DIAGNOSIS — R0789 Other chest pain: Secondary | ICD-10-CM | POA: Diagnosis not present

## 2023-05-25 DIAGNOSIS — Z951 Presence of aortocoronary bypass graft: Secondary | ICD-10-CM | POA: Diagnosis not present

## 2023-05-25 DIAGNOSIS — I10 Essential (primary) hypertension: Secondary | ICD-10-CM | POA: Diagnosis not present

## 2023-05-25 LAB — ECHOCARDIOGRAM COMPLETE
Area-P 1/2: 3.21 cm2
Calc EF: 61.7 %
Height: 62 in
P 1/2 time: 536 ms
S' Lateral: 3.3 cm
Single Plane A2C EF: 58.1 %
Single Plane A4C EF: 66 %
Weight: 2433.88 [oz_av]

## 2023-05-25 LAB — T3: T3, Total: 104 ng/dL (ref 71–180)

## 2023-05-25 MED ORDER — OXYCODONE HCL 5 MG PO TABS
5.0000 mg | ORAL_TABLET | Freq: Once | ORAL | Status: AC
  Administered 2023-05-25: 5 mg via ORAL
  Filled 2023-05-25: qty 1

## 2023-05-25 MED ORDER — LIDOCAINE 5 % EX PTCH
1.0000 | MEDICATED_PATCH | CUTANEOUS | Status: DC
  Administered 2023-05-25 – 2023-05-26 (×2): 1 via TRANSDERMAL
  Filled 2023-05-25 (×2): qty 1

## 2023-05-25 MED ORDER — PANTOPRAZOLE SODIUM 40 MG PO TBEC: 40.0000 mg | DELAYED_RELEASE_TABLET | Freq: Every day | ORAL | Status: DC

## 2023-05-25 MED ORDER — PANTOPRAZOLE SODIUM 20 MG PO TBEC
20.0000 mg | DELAYED_RELEASE_TABLET | Freq: Every day | ORAL | Status: DC
  Administered 2023-05-25 – 2023-05-26 (×2): 20 mg via ORAL
  Filled 2023-05-25 (×2): qty 1

## 2023-05-25 MED ORDER — ALUM & MAG HYDROXIDE-SIMETH 200-200-20 MG/5ML PO SUSP
30.0000 mL | Freq: Once | ORAL | Status: AC
  Administered 2023-05-25: 30 mL via ORAL
  Filled 2023-05-25: qty 30

## 2023-05-25 NOTE — Hospital Course (Addendum)
 #   Atypical chest pain # History of MI Presented with 1 month history of progressively worsening persistent chest pain.  She had mild troponin elevation that flattened.  EKG with normal sinus rhythm, no ST or T wave inversion.  LDL at goal.  [facility] cardiology, and given persistent stabbing chest pain got a LHC, which showed no new obstruction.  Echocardiogram LVEF 60-65%, with grade 1 DD.  I suspect chest pain is multifactorial in the setting of underlying GERD and chest wall muscular pain.  - Increased pantoprazole  to 40 mg daily. - Continue with amlodipine  10 mg daily, carvedilol  3.125. - Continue Plavix  75 mg daily. - Could  consider long-acting nitrate, however limited by patient's softer blood pressures.  # Hyperlipidemia LDL at goal. -Resumed Zetia  10 mg daily.  #Hypothyroidism. Normal TSH and T4. - Continue with levothyroxine  88 mcg daily.  #Anxiety/depression - Xanax  0.5 mg twice daily, and Prozac  60 mg    # COPD  Cw Beztri BID

## 2023-05-25 NOTE — Plan of Care (Signed)
   Problem: Education: Goal: Knowledge of General Education information will improve Description Including pain rating scale, medication(s)/side effects and non-pharmacologic comfort measures Outcome: Progressing   Problem: Health Behavior/Discharge Planning: Goal: Ability to manage health-related needs will improve Outcome: Progressing

## 2023-05-25 NOTE — Progress Notes (Signed)
 Progress Note  Patient Name: Lindsay Rivera Date of Encounter: 05/25/2023 Primary Cardiologist: Kristeen Miss, MD   Subjective   Resting comfortably in bed. After awaking, notes 6/10 chest pain that persists.  Vital Signs    Vitals:   05/24/23 2327 05/24/23 2331 05/25/23 0431 05/25/23 0736  BP:  (!) 119/90 102/65 126/74  Pulse: 64 65 62 60  Resp: 17 16 18    Temp:  98.3 F (36.8 C) 97.8 F (36.6 C)   TempSrc:  Oral Oral   SpO2: 95% 99% 97% 97%  Weight:      Height:        Intake/Output Summary (Last 24 hours) at 05/25/2023 0812 Last data filed at 05/24/2023 1624 Gross per 24 hour  Intake 671.6 ml  Output --  Net 671.6 ml   Filed Weights   05/23/23 1428  Weight: 69 kg    Physical Exam   GEN: No acute distress.   Neck: No JVD Cardiac: RRR, no rubs; systolic murmur Respiratory: Clear to auscultation bilaterally. GI: Soft, nontender, non-distended  MS: No edema  Labs   Telemetry: SR   Chemistry Recent Labs  Lab 05/23/23 1031 05/24/23 0656  NA 138 140  K 5.0 3.7  CL 104 102  CO2 22 25  GLUCOSE 101* 117*  BUN 8 16  CREATININE 0.75 0.79  CALCIUM 9.3 9.0  GFRNONAA >60 >60  ANIONGAP 12 13     Hematology Recent Labs  Lab 05/23/23 1031 05/24/23 1430  WBC 8.8 8.9  RBC 4.86 4.82  HGB 13.8 13.8  HCT 44.0 43.1  MCV 90.5 89.4  MCH 28.4 28.6  MCHC 31.4 32.0  RDW 15.9* 15.9*  PLT 383 379     Cardiac Studies   Cardiac Studies & Procedures   ______________________________________________________________________________________________ CARDIAC CATHETERIZATION  CARDIAC CATHETERIZATION 05/24/2023  Conclusion Images from the original result were not included.    LIMA-mLAD graft was not visualized due to known occlusion. Mid Graft to Dist Graft lesion is 100% stenosed.-Atretic   Previously placed Ost LAD to Prox LAD stent of unknown type is  widely patent.  Mid LAD to Dist LAD lesion is 40% stenosed.   Ost 2nd Diag lesion is 100% stenosed.    Free RIMA-2nd Diag graft was visualized by angiography and is normal in caliber and small.   Mid Cx to Dist Cx lesion is 50% stenosed.  2nd Mrg lesion is 50% stenosed.   Mid RCA to Dist RCA lesion is 100% stenosed.  (Known to be occluded)   SVG-RPDA graft was visualized by angiography and is normal in caliber.   ---------------------------------------   LV end diastolic pressure is moderately elevated.   There is no aortic valve stenosis.  Dominance: Right  Stable coronary disease with known atretic LIMA to LAD and occluded mid RCA both of which were not injected Widely patent SVG-PDA and small caliber free RIMA-1st Diag grafts (however retrograde filling from the diagonal graft does not feel the LAD as it previously did) Widely patent LAD stent and otherwise stable mild disease throughout. Moderately elevated LVEDP of 24 mmHg. LV gram not performed due to difficulty with catheter placement and conserving contrast. She has an echocardiogram ordered.   RECOMMENDATIONS   In the absence of any other complications or medical issues, we expect the patient to be ready for discharge from a cath perspective on 05/18/2023.   Would evaluate for nonmicrovascular ischemic CAD as an etiology for chest pain   Recommend Aspirin 81mg  daily for moderate  CAD.     Bryan Lemma, MD  Findings Coronary Findings Diagnostic  Dominance: Right  Left Anterior Descending Previously placed Ost LAD to Prox LAD stent of unknown type is  widely patent. Mid LAD to Dist LAD lesion is 40% stenosed.  Second Diagonal [employer] 2nd Diag lesion is 100% stenosed.  Left Circumflex Mid Cx to Dist Cx lesion is 50% stenosed.  Second Obtuse Marginal Branch 2nd Mrg lesion is 50% stenosed.  Right Coronary Artery Vessel was not visualized due to inability to cannulate. Known to be occluded Mid RCA to Dist RCA lesion is 100% stenosed.  RIMA Graft To 2nd Diag RIMA graft was visualized by angiography and is normal in  caliber and small.  Free RIMA-2nd Diag Free RIMA graft  LIMA LIMA Graft To Mid LAD LIMA graft was not visualized due to known occlusion.  Known to be atretic Mid Graft to Dist Graft lesion is 100% stenosed.  Saphenous Graft To Ost RPDA SVG graft was visualized by angiography and is normal in caliber.  Tortuous SVG-RPDA  Intervention  No interventions have been documented.   CARDIAC CATHETERIZATION  CARDIAC CATHETERIZATION 11/29/2017  Conclusion  Previously placed Ost LAD to Prox LAD stent (unknown type) is widely patent.  2nd Mrg lesion is 50% stenosed.  Mid Cx to Dist Cx lesion is 50% stenosed.  Mid LAD to Dist LAD lesion is 40% stenosed.  Mid RCA to Dist RCA lesion is 100% stenosed.  SVG graft was visualized by angiography and is normal in caliber.  SVG graft was visualized by angiography and is normal in caliber.  LIMA graft was visualized by angiography and is small.  Mid Graft to Dist Graft lesion is 100% stenosed.  The left ventricular systolic function is normal.  LV end diastolic pressure is normal.  The left ventricular ejection fraction is 55-65% by visual estimate.  1. Single vessel occlusive CAD. 100% RCA after the RV marginal branch. The stent in the ostial LAD is patent. 2. Atretic LIMA to the LAD 3. Patent free RIMA to the diagonal 4. Patent SVG to the distal RCA 5. Normal LV function 6. Normal LVEDP  Plan: medical management.  Recommend Aspirin 81mg  daily for moderate CAD.  Findings Coronary Findings Diagnostic  Dominance: Right  Left Anterior Descending Previously placed Ost LAD to Prox LAD stent (unknown type) is widely patent. Mid LAD to Dist LAD lesion is 40% stenosed.  Left Circumflex Mid Cx to Dist Cx lesion is 50% stenosed.  Second Obtuse Marginal Branch 2nd Mrg lesion is 50% stenosed.  Right Coronary Artery Mid RCA to Dist RCA lesion is 100% stenosed.  Saphenous Graft To Dist RCA SVG graft was visualized by angiography  and is normal in caliber.  Saphenous Graft To 2nd Diag SVG graft was visualized by angiography and is normal in caliber.  LIMA LIMA Graft To Mid LAD LIMA graft was visualized by angiography and is small. The graft is atretic. Mid Graft to Dist Graft lesion is 100% stenosed.  Intervention  No interventions have been documented.   STRESS TESTS  NM MYOCAR MULTI W/SPECT W 11/28/2017  Narrative  There was no ST segment deviation noted during stress.  T wave inversion was noted during stress in the I and aVL leads, beginning at 0 minutes of stress. T wave inversion persisted.  There is a small defect of mild severity present in the basal inferolateral and mid inferolateral location. The defect is reversible. Likely related to attenuation artifact but cannot rule out  a subtle area of ischemia.  There is a small defect of mild severity present in the mid anterior and apical anterior location. The defect is partially reversible. This is likely related to variations in breast attenuation artifact but cannot rule out a small area of ischemia.  The left ventricular ejection fraction is normal (55-65%).  Nuclear stress EF: 65%.  This is a low to intermediate risk study but poor quality. There is significant extracardiac uptake as well as breast and diaphragmatic attenuation. Counts on stress images are reduced in all areas compared to rest images making interpretation difficult.   ECHOCARDIOGRAM  ECHOCARDIOGRAM COMPLETE 01/13/2016  Narrative *[facility]* *[facility]* [address] [place], [place] [address] [phone]  ------------------------------------------------------------------- Transthoracic Echocardiography  Patient:    Dawnn, Nam MR #:       [id number] Study Date: 01/13/2016 Gender:     F Age:        55 Height:     157.5 cm Weight:     75.8 kg BSA:        1.85 m^2 Pt. Status: Room:  ATTENDING    Albin Felling PERFORMING   [facility], Outpatient SONOGRAPHER  Lysbeth Galas, RDCS  cc:  ------------------------------------------------------------------- LV EF: 60% -   65%  ------------------------------------------------------------------- Indications:      Dyspnea 786.09.  ------------------------------------------------------------------- History:   PMH:   Congestive heart failure.  Stroke.  Risk factors: Hypertension. Diabetes mellitus.  ------------------------------------------------------------------- Study Conclusions  - Left ventricle: The cavity size was normal. Wall thickness was normal. Systolic function was normal. The estimated ejection fraction was in the range of 60% to 65%. Wall motion was normal; there were no regional wall motion abnormalities. Features are consistent with a pseudonormal left ventricular filling pattern, with concomitant abnormal relaxation and increased filling pressure (grade 2 diastolic dysfunction). - Aortic valve: There was no stenosis. - Mitral valve: There was no significant regurgitation. - Right ventricle: Poorly visualized. The cavity size was normal. Systolic function was normal. - Tricuspid valve: Peak RV-RA gradient (S): 20 mm Hg. - Pulmonary arteries: PA peak pressure: 23 mm Hg (S). - Inferior vena cava: The vessel was normal in size. The respirophasic diameter changes were in the normal range (>= 50%), consistent with normal central venous pressure.  Impressions:  - Normal LV size with EF 60-65%. Moderate diastolic dysfunction. Normal RV size and systolic function. No significant valvular abnormalities.  ------------------------------------------------------------------- Study data:  No prior study was available for comparison.  Study status:  Routine.  Procedure:  The patient reported no pain pre or post test. Transthoracic echocardiography. Image quality was adequate.  Study completion:  There  were no complications. Transthoracic echocardiography.  M-mode, complete 2D, spectral Doppler, and color Doppler.  Birthdate:  Patient birthdate: [birth year].  Age:  Patient is 63 yr old.  Sex:  Gender: female. BMI: 30.5 kg/m^2.  Blood pressure:     130/80  Patient status: Outpatient.  Study date:  Study date: 01/13/2016. Study time: 08:54 AM.  Location:  Echo laboratory.  -------------------------------------------------------------------  ------------------------------------------------------------------- Left ventricle:  The cavity size was normal. Wall thickness was normal. Systolic function was normal. The estimated ejection fraction was in the range of 60% to 65%. Wall motion was normal; there were no regional wall motion abnormalities. Features are consistent with a pseudonormal left ventricular filling pattern, with concomitant abnormal relaxation and increased filling pressure (grade 2 diastolic dysfunction).  -------------------------------------------------------------------  Aortic valve:   Trileaflet.  Doppler:   There was no stenosis. There was no regurgitation.  ------------------------------------------------------------------- Aorta:  Aortic root: The aortic root was normal in size. Ascending aorta: The ascending aorta was normal in size.  ------------------------------------------------------------------- Mitral valve:   Normal thickness leaflets .  Doppler:   There was no evidence for stenosis.   There was no significant regurgitation.  ------------------------------------------------------------------- Left atrium:  The atrium was normal in size.  ------------------------------------------------------------------- Right ventricle:  Poorly visualized. The cavity size was normal. Systolic function was normal.  ------------------------------------------------------------------- Pulmonic valve:    Structurally normal valve.   Cusp separation was normal.  Doppler:   Transvalvular velocity was within the normal range. There was no regurgitation.  ------------------------------------------------------------------- Tricuspid valve:   Doppler:  There was trivial regurgitation.  ------------------------------------------------------------------- Right atrium:  The atrium was normal in size.  ------------------------------------------------------------------- Pericardium:  There was no pericardial effusion.  ------------------------------------------------------------------- Systemic veins: Inferior vena cava: The vessel was normal in size. The respirophasic diameter changes were in the normal range (>= 50%), consistent with normal central venous pressure.  ------------------------------------------------------------------- Measurements  Left ventricle                         Value        Reference LV ID, ED, PLAX chordal        (L)     40.9  mm     43 - 52 LV ID, ES, PLAX chordal                30.7  mm     23 - 38 LV fx shortening, PLAX chordal (L)     25    %      >=29 LV PW thickness, ED                    9.36  mm     --------- IVS/LV PW ratio, ED                    0.87         <=1.3 Stroke volume, 2D                      50    ml     --------- Stroke volume/bsa, 2D                  27    ml/m^2 --------- LV e&', lateral                         8.11  cm/s   --------- LV E/e&', lateral                       7.13         --------- LV e&', medial                          4.39  cm/s   --------- LV E/e&', medial                        13.17        --------- LV e&', average                         6.25  cm/s   --------- LV E/e&', average  9.25         ---------  Ventricular septum                     Value        Reference IVS thickness, ED                      8.11  mm     ---------  LVOT                                   Value        Reference LVOT ID, S                             18    mm     --------- LVOT area                               2.54  cm^2   --------- LVOT peak velocity, S                  92.5  cm/s   --------- LVOT mean velocity, S                  62.6  cm/s   --------- LVOT VTI, S                            19.7  cm     --------- LVOT peak gradient, S                  3     mm Hg  ---------  Aorta                                  Value        Reference Aortic root ID, ED                     27    mm     ---------  Left atrium                            Value        Reference LA ID, A-P, ES                         32    mm     --------- LA ID/bsa, A-P                         1.73  cm/m^2 <=2.2 LA volume, S                           28    ml     --------- LA volume/bsa, S                       15.2  ml/m^2 --------- LA volume, ES, 1-p A4C                 27    ml     ---------  LA volume/bsa, ES, 1-p A4C             14.6  ml/m^2 --------- LA volume, ES, 1-p A2C                 28    ml     --------- LA volume/bsa, ES, 1-p A2C             15.2  ml/m^2 ---------  Mitral valve                           Value        Reference Mitral E-wave peak velocity            57.8  cm/s   --------- Mitral A-wave peak velocity            50.8  cm/s   --------- Mitral deceleration time               222   ms     150 - 230 Mitral E/A ratio, peak                 1.1          ---------  Pulmonary arteries                     Value        Reference PA pressure, S, DP                     23    mm Hg  <=30  Tricuspid valve                        Value        Reference Tricuspid regurg peak velocity         221   cm/s   --------- Tricuspid peak RV-RA gradient          20    mm Hg  ---------  Systemic veins                         Value        Reference Estimated CVP                          3     mm Hg  ---------  Legend: (L)  and  (H)  mark values outside specified reference range.  ------------------------------------------------------------------- Prepared and Electronically Authenticated  by  Marca Ancona, M.D. 2017-12-06T16:22:04          ______________________________________________________________________________________________       Assessment & Plan   Coronary artery disease s/p CABG (2015), DES to LM (2017) Persistent chest pain - evidence of obstructive coronary artery disease; reviewed Dr. Elissa Hefty recommendations and agree- discomfort may not be cardiac - an echocardiogram was ordered yesterday and is pending today; if stable from 01/13/2016 will plan for outpatient follow up with Dr. Elease Hashimoto   Hypertension - some elevated and low blood pressures; largely controlled   Hyperlipidemia  - LDL < 55 on current therapy -  Zetia 10 mh   Per primary Hypothyroidism  Diabetes Bipolar disorder Chronic low back pain Non-Cardiac chest pain  Will sign off 05/25/23 if stable echocardiogram, with cardiology follow up  For questions or updates, please contact [facility] Please consult [email or web address] for contact info under Cardiology/STEMI.  Gloriann Larger, MD FASE [facility] Cardiologist [facility]  [address] [place], [address] [place], [place] [address] [phone]  8:13 AM

## 2023-05-25 NOTE — Plan of Care (Signed)
  Problem: Clinical Measurements: Goal: Ability to maintain clinical measurements within normal limits will improve Outcome: Progressing   Problem: Clinical Measurements: Goal: Cardiovascular complication will be avoided Outcome: Progressing   Problem: Activity: Goal: Risk for activity intolerance will decrease Outcome: Progressing   Problem: Nutrition: Goal: Adequate nutrition will be maintained Outcome: Progressing   Problem: Elimination: Goal: Will not experience complications related to bowel motility Outcome: Progressing   Problem: Pain Managment: Goal: General experience of comfort will improve and/or be controlled Outcome: Progressing   Problem: Safety: Goal: Ability to remain free from injury will improve Outcome: Progressing

## 2023-05-25 NOTE — Progress Notes (Signed)
 Mobility Specialist Progress Note;   05/25/23 0923  Mobility  Activity Ambulated with assistance in hallway  Level of Assistance Standby assist, set-up cues, supervision of patient - no hands on  Assistive Device Front wheel walker  Distance Ambulated (ft) 80 ft  Activity Response Tolerated well  Mobility Referral Yes  Mobility visit 1 Mobility  Mobility Specialist Start Time (ACUTE ONLY) [id number]  Mobility Specialist Stop Time (ACUTE ONLY) 0929  Mobility Specialist Time Calculation (min) (ACUTE ONLY) 6 min   Pt agreeable to mobility. Required no physical assistance during ambulation, SV. VSS throughout. Only c/o some chest tightness that has been on going. Otherwise asx. Pt safely returned back to bed with all needs met.   Lindsay Rivera Mobility Specialist Please contact via SecureChat or [employer] [phone]

## 2023-05-25 NOTE — Progress Notes (Signed)
  Echocardiogram 2D Echocardiogram has been performed.  Lindsay Rivera 05/25/2023, 1:50 PM
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
## 2023-05-26 ENCOUNTER — Other Ambulatory Visit (HOSPITAL_COMMUNITY): Payer: Self-pay

## 2023-05-26 DIAGNOSIS — I10 Essential (primary) hypertension: Secondary | ICD-10-CM | POA: Diagnosis not present

## 2023-05-26 DIAGNOSIS — K219 Gastro-esophageal reflux disease without esophagitis: Secondary | ICD-10-CM | POA: Diagnosis not present

## 2023-05-26 DIAGNOSIS — R0789 Other chest pain: Secondary | ICD-10-CM | POA: Diagnosis not present

## 2023-05-26 DIAGNOSIS — Z955 Presence of coronary angioplasty implant and graft: Secondary | ICD-10-CM

## 2023-05-26 DIAGNOSIS — Z951 Presence of aortocoronary bypass graft: Secondary | ICD-10-CM | POA: Diagnosis not present

## 2023-05-26 DIAGNOSIS — I252 Old myocardial infarction: Secondary | ICD-10-CM | POA: Diagnosis not present

## 2023-05-26 DIAGNOSIS — I25110 Atherosclerotic heart disease of native coronary artery with unstable angina pectoris: Secondary | ICD-10-CM | POA: Diagnosis not present

## 2023-05-26 LAB — LIPOPROTEIN A (LPA): Lipoprotein (a): 403.9 nmol/L — ABNORMAL HIGH (ref <75.0)

## 2023-05-26 MED ORDER — EZETIMIBE 10 MG PO TABS
10.0000 mg | ORAL_TABLET | Freq: Every day | ORAL | Status: DC
  Administered 2023-05-26: 10 mg via ORAL
  Filled 2023-05-26: qty 1

## 2023-05-26 MED ORDER — CLOPIDOGREL BISULFATE 75 MG PO TABS
75.0000 mg | ORAL_TABLET | Freq: Every day | ORAL | 0 refills | Status: DC
  Filled 2023-05-26: qty 30, 30d supply, fill #0

## 2023-05-26 MED ORDER — HYOSCYAMINE SULFATE 0.125 MG SL SUBL
0.2500 mg | SUBLINGUAL_TABLET | Freq: Once | SUBLINGUAL | Status: AC
  Administered 2023-05-26: 0.25 mg via SUBLINGUAL
  Filled 2023-05-26: qty 2

## 2023-05-26 MED ORDER — PANTOPRAZOLE SODIUM 40 MG PO TBEC
40.0000 mg | DELAYED_RELEASE_TABLET | Freq: Every day | ORAL | 0 refills | Status: AC
  Filled 2023-05-26: qty 30, 30d supply, fill #0

## 2023-05-26 MED ORDER — PANTOPRAZOLE SODIUM 40 MG PO TBEC
40.0000 mg | DELAYED_RELEASE_TABLET | Freq: Every day | ORAL | 1 refills | Status: AC
Start: 2023-05-26 — End: 2024-05-25
  Filled 2023-05-26: qty 30, 30d supply, fill #0

## 2023-05-26 MED ORDER — ALUM & MAG HYDROXIDE-SIMETH 200-200-20 MG/5ML PO SUSP
30.0000 mL | Freq: Once | ORAL | Status: AC
  Administered 2023-05-26: 30 mL via ORAL
  Filled 2023-05-26: qty 30

## 2023-05-26 MED ORDER — DICYCLOMINE HCL 10 MG PO CAPS
10.0000 mg | ORAL_CAPSULE | Freq: Once | ORAL | Status: AC
  Administered 2023-05-26: 10 mg via ORAL
  Filled 2023-05-26: qty 1

## 2023-05-26 MED ORDER — CLOPIDOGREL BISULFATE 75 MG PO TABS
75.0000 mg | ORAL_TABLET | Freq: Every day | ORAL | Status: DC
  Administered 2023-05-26: 75 mg via ORAL
  Filled 2023-05-26: qty 1

## 2023-05-26 MED ORDER — LINACLOTIDE 145 MCG PO CAPS
290.0000 ug | ORAL_CAPSULE | Freq: Every day | ORAL | Status: DC
  Administered 2023-05-26: 290 ug via ORAL
  Filled 2023-05-26: qty 2

## 2023-05-26 MED ORDER — LINACLOTIDE 145 MCG PO CAPS: 290.0000 ug | ORAL_CAPSULE | Freq: Every day | ORAL | Status: DC

## 2023-05-26 NOTE — Plan of Care (Signed)
   Problem: Education: Goal: Knowledge of General Education information will improve Description Including pain rating scale, medication(s)/side effects and non-pharmacologic comfort measures Outcome: Progressing   Problem: Health Behavior/Discharge Planning: Goal: Ability to manage health-related needs will improve Outcome: Progressing

## 2023-05-26 NOTE — Progress Notes (Signed)
 Mobility Specialist Progress Note;   05/26/23 1016  Mobility  Activity Ambulated with assistance in hallway  Level of Assistance Standby assist, set-up cues, supervision of patient - no hands on  Assistive Device Front wheel walker  Distance Ambulated (ft) 50 ft  Activity Response Tolerated fair  Mobility Referral Yes  Mobility visit 1 Mobility  Mobility Specialist Start Time (ACUTE ONLY) 1016  Mobility Specialist Stop Time (ACUTE ONLY) 1022  Mobility Specialist Time Calculation (min) (ACUTE ONLY) 6 min   Pt in pain however agreeable to mobility. Required no physical assistance during ambulation, SV. Pt c/o chest pain, thinks it is more muscular. VSS throughout. Pt returned back to bed with all needs met.   Janit Meline Mobility Specialist Please contact via SecureChat or [employer] [phone]

## 2023-05-26 NOTE — Discharge Summary (Addendum)
 Name: Lindsay Rivera MRN: [id number] DOB: 1960-07-24 63 y.o. PCP: Pcp, No  Date of Admission: 05/23/2023 10:21 AM Date of Discharge: 05/26/2023 Attending Physician: Dr. Gayl Katos  Discharge Diagnosis: Active Problems:   Hypertension   Chest wall pain   Unstable angina (HCC)   Hx of CABG   Gastroesophageal reflux disease    Discharge Medications: Allergies as of 05/26/2023       Reactions   Penicillins Shortness Of Breath, Swelling   Has patient had a PCN reaction causing immediate rash, facial/tongue/throat swelling, SOB or lightheadedness with hypotension: No Has patient had a PCN reaction causing severe rash involving mucus membranes or skin necrosis: No Has patient had a PCN reaction that required hospitalization No Has patient had a PCN reaction occurring within the last 10 years: No If all of the above answers are "NO", then may proceed with Cephalosporin use.   Other Other (See Comments)   Gadolinium Derivatives Nausea And Vomiting, Cough   Radiologist and nurse came, IV benadryl  was administered, pt was observed in nurses stations for 30 min prior to leaving, no others steps taken   Iodine Nausea And Vomiting, Cough   Latex Rash        Medication List     STOP taking these medications    Calcium  Carbonate-Vitamin D  600-400 MG-UNIT chew tablet   dicyclomine  20 MG tablet Commonly known as: BENTYL    DULoxetine  20 MG capsule Commonly known as: Cymbalta    ezetimibe  10 MG tablet Commonly known as: ZETIA    fluticasone  50 MCG/ACT nasal spray Commonly known as: FLONASE    metFORMIN  500 MG 24 hr tablet Commonly known as: GLUCOPHAGE -XR   nitroGLYCERIN  0.4 MG SL tablet Commonly known as: NITROSTAT    ondansetron  4 MG tablet Commonly known as: ZOFRAN    triamterene -hydrochlorothiazide  37.5-25 MG tablet Commonly known as: MAXZIDE -25       TAKE these medications    ALPRAZolam  0.5 MG tablet Commonly known as: XANAX  Take 1 tablet (0.5 mg total) by mouth 2  (two) times daily as needed for anxiety.   amLODipine  10 MG tablet Commonly known as: NORVASC  Take 1 tablet (10 mg total) by mouth daily.   benzonatate 200 MG capsule Commonly known as: TESSALON Take 200 mg by mouth 3 (three) times daily as needed for cough.   Breztri  Aerosphere 160-9-4.8 MCG/ACT Aero inhaler Generic drug: budeson-glycopyrrolate -formoterol  Inhale 2 puffs into the lungs in the morning and at bedtime.   carvedilol  3.125 MG tablet Commonly known as: COREG  Take 1 tablet (3.125 mg total) by mouth daily.   cetirizine  10 MG tablet Commonly known as: ZYRTEC  Take 10 mg by mouth at bedtime.   clopidogrel  75 MG tablet Commonly known as: PLAVIX  Take 1 tablet (75 mg total) by mouth daily.   ergocalciferol  1.25 MG (50000 UT) capsule Commonly known as: VITAMIN D2 Take 1 capsule (50,000 Units total) by mouth once a week.   famotidine  20 MG tablet Commonly known as: PEPCID  Take 1 tablet (20 mg total) by mouth 2 (two) times daily.   FLUoxetine  20 MG capsule Commonly known as: PROZAC  Take 60 mg by mouth every morning. What changed: Another medication with the same name was removed. Continue taking this medication, and follow the directions you see here.   levothyroxine  100 MCG tablet Commonly known as: SYNTHROID  Take 100 mcg by mouth daily. What changed: Another medication with the same name was removed. Continue taking this medication, and follow the directions you see here.   linaclotide  290 MCG Caps capsule Commonly  known as: LINZESS  Take 1 capsule (290 mcg total) by mouth daily before breakfast.   oxyCODONE -acetaminophen  10-325 MG tablet Commonly known as: PERCOCET Take 1 tablet by mouth every 6 (six) hours as needed for pain.   pantoprazole  40 MG tablet Commonly known as: Protonix  Take 1 tablet (40 mg total) by mouth daily. What changed:  medication strength how much to take   pantoprazole  40 MG tablet Commonly known as: PROTONIX  Take 1 tablet (40 mg  total) by mouth daily. What changed: Another medication with the same name was changed. Make sure you understand how and when to take each.   Trelegy Ellipta 100-62.5-25 MCG/ACT Aepb Generic drug: Fluticasone -Umeclidin-Vilant Inhale 1 puff into the lungs daily.   valACYclovir  500 MG tablet Commonly known as: VALTREX  Take 1 tablet (500 mg total) by mouth daily.        Disposition and follow-up:   [name] was discharged from [facility] in [place] condition.  At the hospital follow up visit please address:  1.  Follow-up:  a.  Atypical chest pain # Prior MI  cardiac cause likely; LHC without new obstructions, echocardiogram showed normal LV EF 60-65% with G1 DD.  Suspect chest tightness secondary to costochondritis and GERD. - Increased pantoprazole  to 40 mg - Continue with clopidogrel  75 mg  b.  Hyperlipidemia LDL at goal, on Zetia , patient has been nonadherent. - Please continue to encourage adherence.  c.  Hypertension Stable. - Amlodipine  10 mg daily - Carvedilol  3.125 mg BID.  2.  Labs / imaging needed at time of follow-up: None   3.  Pending labs/ test needing follow-up: None  4.  Medication Changes Abx -   End Date:  Follow-up Appointments: -Follow-up with cardiology in clinic. - Follow-up with primary care doctor.  Hospital Course by problem list:   # Atypical chest pain # History of MI Presented with 1 month history of progressively worsening persistent chest pain.  She had mild troponin elevation that flattened.  EKG with normal sinus rhythm, no ST or T wave inversion.  LDL at goal.  [facility] cardiology, and given persistent stabbing chest pain got a LHC, which showed no new obstruction.  Echocardiogram LVEF 60-65%, with grade 1 DD.  I suspect chest pain is multifactorial in the setting of underlying GERD and chest wall muscular pain.  - Increased pantoprazole  to 40 mg daily. - Continue with amlodipine  10 mg daily, carvedilol   3.125. - Continue Plavix  75 mg daily. - Could  consider long-acting nitrate, however limited by patient's softer blood pressures.  # Hyperlipidemia LDL at goal. -Resumed Zetia  10 mg daily.  #Hypothyroidism. Normal TSH and T4. - Continue with levothyroxine  88 mcg daily.  #Anxiety/depression - Xanax  0.5 mg twice daily, and Prozac  60 mg    # COPD  Cw Beztri BID    Discharge Subjective: Patient evaluated at bedside this AM.  Discharge Exam:   BP (!) 111/96 (BP Location: Right Arm)   Pulse 64   Temp 97.9 F (36.6 C) (Oral)   Resp 16   Ht 5\' 2"  (1.575 m)   Wt 69 kg   SpO2 95%   BMI 27.82 kg/m   Sitting in bed, NAD. Cardiovascular: regular rate and rhythm, chest wall tender to deep palpation. Pulmonary/Chest: Clear lungs bilaterally.  Pertinent Labs, Studies, and Procedures:     Latest Ref Rng & Units 05/24/2023    2:30 PM 05/23/2023   10:31 AM 08/22/2022    1:26 PM  CBC  WBC  4.0 - 10.5 K/uL 8.9  8.8  18.1   Hemoglobin 12.0 - 15.0 g/dL 84.1  32.4  40.1   Hematocrit 36.0 - 46.0 % 43.1  44.0  46.0   Platelets 150 - 400 K/uL 379  383  528        Latest Ref Rng & Units 05/24/2023    6:56 AM 05/23/2023   10:31 AM 08/22/2022    1:26 PM  CMP  Glucose 70 - 99 mg/dL 027  253  664   BUN 8 - 23 mg/dL 16  8  26    Creatinine 0.44 - 1.00 mg/dL 4.03  4.74  2.59   Sodium 135 - 145 mmol/L 140  138  130   Potassium 3.5 - 5.1 mmol/L 3.7  5.0  2.8   Chloride 98 - 111 mmol/L 102  104  91   CO2 22 - 32 mmol/L 25  22  23    Calcium  8.9 - 10.3 mg/dL 9.0  9.3  9.4   Total Protein 6.5 - 8.1 g/dL   8.4   Total Bilirubin 0.3 - 1.2 mg/dL   1.2   Alkaline Phos 38 - 126 U/L   102   AST 15 - 41 U/L   20   ALT 0 - 44 U/L   17     CARDIAC CATHETERIZATION Result Date: 05/24/2023 Images from the original result were not included.   LIMA-mLAD graft was not visualized due to known occlusion. Mid Graft to Dist Graft lesion is 100% stenosed.-Atretic   Previously placed Ost LAD to Prox LAD stent of  unknown type is  widely patent.  Mid LAD to Dist LAD lesion is 40% stenosed.   Ost 2nd Diag lesion is 100% stenosed.   Free RIMA-2nd Diag graft was visualized by angiography and is normal in caliber and small.   Mid Cx to Dist Cx lesion is 50% stenosed.  2nd Mrg lesion is 50% stenosed.   Mid RCA to Dist RCA lesion is 100% stenosed.  (Known to be occluded)   SVG-RPDA graft was visualized by angiography and is normal in caliber.   ---------------------------------------   LV end diastolic pressure is moderately elevated.   There is no aortic valve stenosis. Dominance: Right Stable coronary disease with known atretic LIMA to LAD and occluded mid RCA both of which were not injected Widely patent SVG-PDA and small caliber free RIMA-1st Diag grafts (however retrograde filling from the diagonal graft does not feel the LAD as it previously did) Widely patent LAD stent and otherwise stable mild disease throughout. Moderately elevated LVEDP of 24 mmHg. LV gram not performed due to difficulty with catheter placement and conserving contrast. She has an echocardiogram ordered. RECOMMENDATIONS   In the absence of any other complications or medical issues, we expect the patient to be ready for discharge from a cath perspective on 05/18/2023.   Would evaluate for nonmicrovascular ischemic CAD as an etiology for chest pain   Recommend Aspirin  81mg  daily for moderate CAD. Randene Bustard, MD  DG Chest 2 View Result Date: 05/23/2023 CLINICAL DATA:  Chest pain. EXAM: CHEST - 2 VIEW COMPARISON:  01/22/2022. FINDINGS: Bilateral lung fields are clear. Bilateral costophrenic angles are clear. Normal cardio-mediastinal silhouette. No acute osseous abnormalities. The soft tissues are within normal limits. IMPRESSION: No active cardiopulmonary disease. Electronically Signed   By: Beula Brunswick M.D.   On: 05/23/2023 10:59     Discharge Instructions: Discharge Instructions     Call MD for:  difficulty breathing,  headache or visual  disturbances   Complete by: As directed    Call MD for:  persistant dizziness or light-headedness   Complete by: As directed    Diet - low sodium heart healthy   Complete by: As directed    Discharge instructions   Complete by: As directed    - Follow-up with primary care doctor - Follow-up with cardiology in clinic.   Increase activity slowly   Complete by: As directed        Signed: Marni Sins, MD 05/26/2023, 2:21 PM   Pager: [phone]

## 2023-05-26 NOTE — Discharge Instructions (Signed)
 Lindsay Rivera,   It was a pleasure taking care of you in the hospital. You were hospitalized for chest pain, and after a catherization it seems like the pain wasn't coming from your heart, it may be related to muscles as well as some reflux. We are going to increase your pantoprazole  to 40mg , which should help with your pain. Please make a follow up appointment with your cardiologist and PCP as soon as you can.   Best, Internal Medicine Team

## 2023-05-26 NOTE — Progress Notes (Addendum)
 Patient Name: Lindsay Rivera Date of Encounter: 05/26/2023 [facility] Cardiologist: Ahmad Alert, MD    Interval Summary  .    Patient reports feeling poorly this am. She reports that she has not had a bowel movement since Monday. Also reports ongoing chest discomfort, difficulty taking deep breaths. Cardiac workup has been reassuring   Vital Signs .    Vitals:   05/25/23 1920 05/26/23 0415 05/26/23 0738 05/26/23 0830  BP: (!) 140/64 115/72 (!) 150/83   Pulse: (!) 59 (!) 57 63   Resp: 20 16 16    Temp: 97.7 F (36.5 C) 98 F (36.7 C) 97.6 F (36.4 C)   TempSrc: Oral Oral Oral   SpO2: 95% 95%  95%  Weight:      Height:        Intake/Output Summary (Last 24 hours) at 05/26/2023 1019 Last data filed at 05/26/2023 0800 Gross per 24 hour  Intake 240 ml  Output --  Net 240 ml      05/23/2023    2:28 PM 05/23/2023    9:18 AM 08/22/2022    1:07 PM  Last 3 Weights  Weight (lbs) 152 lb 1.9 oz 153 lb 145 lb  Weight (kg) 69 kg 69.4 kg 65.772 kg      Telemetry/ECG    NSR - Personally Reviewed  Physical Exam .   GEN: No acute distress.  Laying in the bed with head elevated  Neck: No JVD Cardiac:  RRR, no murmurs, rubs, or gallops. Right radial cath site is soft, nontender Respiratory: Clear to auscultation bilaterally. Normal Wob on room air  GI: Soft, nontender, non-distended  MS: No edema in BLE   Assessment & Plan .     CAD  Chest pain  - Previously had CABG in 2015, DES to LM in 2017  - Presented with chest pain. hsTn negative x3 - Cath on 4/16 showed stable CAD with known atretic LIMA-LAD, occluded mid RCA. Widely patent SVG-PAD and small caliber free RIMA-1st diag grafts. Patent LAD stent  - Echo yesterday showed EF 60-65%, no regional wall motion abnormalities, grade I DD, normal RV systolic function - Overall, suspect pain is not cardiac. She reports that she has not had a bowel movement since Monday and feels very constipated, possible GI related  discomfort?  - Continue amlodipine  10 mg daily, carvedilol  3.125 mg BID - Resume zetia  10 mg daily (previously on at home but was not taking prior to admission)  - Resume plavix  75 mg daily   HTN  - BP overall well controlled  - Continue amlodipine  10 mg daily, carvedilol  3.125 mg BID,   HLD  - Lipid panel this admission with LDL 54 - As above, resume zetia  10 mg daily   Otherwise per primary  - Hypothyroidism  - Diabetes  - Chronic low back pain  - Non-cardiac chest pain  - Constipation-   For questions or updates, please contact [facility] Please consult [email or web address] for contact info under        Signed, Debria Fang, PA-C   I have personally seen and examined the patient.  My HPI, Exam, and assessment and plan are below, independent of the NPP above.  Ms. Beckworth experiences chest pain rated as ten out of ten, even while resting comfortably in bed. Since last evaluation, an echocardiogram performed showed no wall motion abnormalities.  She also has abdominal pain, which she attributes to not having a bowel movement.  Her current  medications include Zetia , which has kept her LDL well controlled, and Plavix . Her hypertension is managed with a calcium  channel blocker, a beta blocker, and a long-acting nitrate. She is amenable to taking her medications (had some non adherence when seeing Dr. Alroy Aspen).  Exam notable for  Gen: no distress   Neck: No JVD Cardiac: No Rubs or Gallops, no murmur, RRR +2 radial pulses Respiratory: Clear to auscultation bilaterally, normal effort, normal  respiratory rate GI: Soft, nontender, non-distended  MS: No  edema;  moves all extremities Integument: Skin feels warm Neuro:  At time of evaluation, alert and oriented to person/place/time/situation  Psych: Normal affect, patient feels fair  Tele: SR   In assessment and plan:   Coronary Artery Disease s/p CABG She has coronary artery disease with no new blockages.  Echocardiogram showed no wall motion abnormalities. Chest pain rated 10 out of 10 is likely non-cardiac in origin given the nonobstructive nature of her residual blockages. - Continue medical management therapy as previously established - Continue Zetia  for LDL control - Continue Plavix   Hypertension Her hypertension is well controlled under current medication therapy. - Continue calcium  channel blocker - Continue beta blocker - Continue long acting nitrate  Constipation She reports abdominal pain related to not having a bowel movement. Hyperactive bowel sounds were noted on exam, but no pain was elicited during the examination. Discomfort may be related to constipation.  Follow-up She will be followed as an outpatient. - Sign off from inpatient care - Follow up with Doctor Nasser's team and primary care doctor  Gloriann Larger, MD FASE [facility] Cardiologist [facility]  [facility]  [address] [place], [address] [place], [place] [address] [phone]  11:56 AM

## 2023-05-30 ENCOUNTER — Telehealth: Payer: Self-pay | Admitting: Cardiovascular Disease

## 2023-05-30 NOTE — Telephone Encounter (Signed)
 Pt called in asking for a prescription for Nitroglycerin  to be sent to [facility]. Please advise.

## 2023-05-30 NOTE — Telephone Encounter (Signed)
 Spoke with pt regarding her request for more nitroglycerin . Pt stated she is not currently having chest pain but wants the prescription for when she does. Pt was taken off of the nitroglycerin  after a recent admission to the ED. Per discharge summary pt was taken off of nitroglycerin  for soft blood pressures. Pt was told her request would be forwarded to Dr. Alroy Aspen and his nurse. Pt verbalized understanding. All questions if any were answered.

## 2023-06-01 MED ORDER — NITROGLYCERIN 0.4 MG SL SUBL: 0.4000 mg | SUBLINGUAL_TABLET | SUBLINGUAL | 3 refills | Status: AC | PRN

## 2023-06-01 NOTE — Telephone Encounter (Signed)
 Spoke with Lindsay Rivera and let her know that Dr. Alroy Aspen is ok with refilling her Nitroglycerin . Nitroglycerin  was ordered and sent to the Lindsay Rivera's pharmacy of choice. Lindsay Rivera verbalized understanding. All questions if any were answered.

## 2023-07-07 ENCOUNTER — Emergency Department (HOSPITAL_COMMUNITY)

## 2023-07-07 ENCOUNTER — Other Ambulatory Visit: Payer: Self-pay

## 2023-07-07 ENCOUNTER — Emergency Department (HOSPITAL_COMMUNITY)
Admission: EM | Admit: 2023-07-07 | Discharge: 2023-07-07 | Disposition: A | Discharge status: Home / Self Care | Attending: Emergency Medicine | Admitting: Emergency Medicine

## 2023-07-07 ENCOUNTER — Ambulatory Visit: Admitting: Physician Assistant

## 2023-07-07 ENCOUNTER — Encounter (HOSPITAL_COMMUNITY): Payer: Self-pay

## 2023-07-07 DIAGNOSIS — Y92410 Unspecified street and highway as the place of occurrence of the external cause: Secondary | ICD-10-CM | POA: Insufficient documentation

## 2023-07-07 DIAGNOSIS — R0981 Nasal congestion: Secondary | ICD-10-CM | POA: Insufficient documentation

## 2023-07-07 DIAGNOSIS — R1012 Left upper quadrant pain: Secondary | ICD-10-CM | POA: Diagnosis not present

## 2023-07-07 DIAGNOSIS — R079 Chest pain, unspecified: Secondary | ICD-10-CM | POA: Insufficient documentation

## 2023-07-07 DIAGNOSIS — M25562 Pain in left knee: Secondary | ICD-10-CM | POA: Insufficient documentation

## 2023-07-07 DIAGNOSIS — Z91040 Latex allergy status: Secondary | ICD-10-CM | POA: Insufficient documentation

## 2023-07-07 DIAGNOSIS — R059 Cough, unspecified: Secondary | ICD-10-CM | POA: Diagnosis not present

## 2023-07-07 LAB — CBC
HCT: 39.8 % (ref 36.0–46.0)
Hemoglobin: 12.5 g/dL (ref 12.0–15.0)
MCH: 27.8 pg (ref 26.0–34.0)
MCHC: 31.4 g/dL (ref 30.0–36.0)
MCV: 88.4 fL (ref 80.0–100.0)
Platelets: 371 10*3/uL (ref 150–400)
RBC: 4.5 MIL/uL (ref 3.87–5.11)
RDW: 16.1 % — ABNORMAL HIGH (ref 11.5–15.5)
WBC: 11.1 10*3/uL — ABNORMAL HIGH (ref 4.0–10.5)
nRBC: 0 % (ref 0.0–0.2)

## 2023-07-07 LAB — BASIC METABOLIC PANEL WITH GFR
Anion gap: 11 (ref 5–15)
BUN: 10 mg/dL (ref 8–23)
CO2: 25 mmol/L (ref 22–32)
Calcium: 8.8 mg/dL — ABNORMAL LOW (ref 8.9–10.3)
Chloride: 102 mmol/L (ref 98–111)
Creatinine, Ser: 0.78 mg/dL (ref 0.44–1.00)
GFR, Estimated: >60 mL/min (ref >60)
Glucose, Bld: 97 mg/dL (ref 70–99)
Potassium: 3.6 mmol/L (ref 3.5–5.1)
Sodium: 138 mmol/L (ref 135–145)

## 2023-07-07 MED ORDER — OXYCODONE-ACETAMINOPHEN 5-325 MG PO TABS
1.0000 | ORAL_TABLET | Freq: Once | ORAL | Status: AC
  Administered 2023-07-07: 1 via ORAL
  Filled 2023-07-07: qty 1

## 2023-07-07 MED ORDER — OXYCODONE-ACETAMINOPHEN 10-325 MG PO TABS: 1.0000 | ORAL_TABLET | Freq: Four times a day (QID) | ORAL | 0 refills | Status: AC | PRN

## 2023-07-07 MED ORDER — HYDROMORPHONE HCL 1 MG/ML IJ SOLN
1.0000 mg | Freq: Once | INTRAMUSCULAR | Status: AC
  Administered 2023-07-07: 1 mg via INTRAVENOUS
  Filled 2023-07-07: qty 1

## 2023-07-07 MED ORDER — PREDNISONE 20 MG PO TABS: 40.0000 mg | ORAL_TABLET | Freq: Every day | ORAL | 0 refills | Status: AC

## 2023-07-07 MED ORDER — IOHEXOL 300 MG/ML  SOLN
100.0000 mL | Freq: Once | INTRAMUSCULAR | Status: AC | PRN
  Administered 2023-07-07: 100 mL via INTRAVENOUS

## 2023-07-07 MED ORDER — DICLOFENAC EPOLAMINE 1.3 % EX PTCH: 1.0000 | MEDICATED_PATCH | Freq: Two times a day (BID) | CUTANEOUS | 1 refills | Status: AC

## 2023-07-07 MED ORDER — AZITHROMYCIN 250 MG PO TABS: ORAL_TABLET | ORAL | 0 refills | Status: AC

## 2023-07-07 NOTE — ED Notes (Signed)
 Pt placed on 2l [place] o2 due to sats 90-91% ra. No resp distress or shob noted. Nad.
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
## 2023-07-07 NOTE — ED Notes (Signed)
 Pt/family received d/c paperwork at this time. After going over the paperwork any questions, comments, or concerns were answered to the best of this nurse's knowledge. The pt/family verbally acknowledged the teachings/instructions.

## 2023-07-07 NOTE — Discharge Instructions (Signed)
 Your evaluation today has been reassuring, but if you develop new, or concerning changes return here for additional evaluation.

## 2023-07-07 NOTE — ED Notes (Signed)
 See triage notes. Nad. Has been ambulatory. No resp distress/shob noted.
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
## 2023-10-18 ENCOUNTER — Other Ambulatory Visit: Payer: Self-pay

## 2023-10-18 DIAGNOSIS — I25700 Atherosclerosis of coronary artery bypass graft(s), unspecified, with unstable angina pectoris: Secondary | ICD-10-CM

## 2023-10-18 MED ORDER — CLOPIDOGREL BISULFATE 75 MG PO TABS: 75.0000 mg | ORAL_TABLET | Freq: Every day | ORAL | 7 refills | Status: AC
